# Patient Record
Sex: Female | Born: 1950 | Race: White | Hispanic: No | Marital: Married | Smoking: Former smoker
Health system: Southern US, Community
[De-identification: ages and names within clinical notes are randomized; demographics above are authoritative.]

## PROBLEM LIST (undated history)

## (undated) DIAGNOSIS — K219 Gastro-esophageal reflux disease without esophagitis: Secondary | ICD-10-CM

## (undated) DIAGNOSIS — R7301 Impaired fasting glucose: Secondary | ICD-10-CM

## (undated) DIAGNOSIS — F32A Depression, unspecified: Secondary | ICD-10-CM

## (undated) DIAGNOSIS — I639 Cerebral infarction, unspecified: Secondary | ICD-10-CM

## (undated) DIAGNOSIS — F419 Anxiety disorder, unspecified: Secondary | ICD-10-CM

## (undated) DIAGNOSIS — M810 Age-related osteoporosis without current pathological fracture: Secondary | ICD-10-CM

## (undated) DIAGNOSIS — E785 Hyperlipidemia, unspecified: Secondary | ICD-10-CM

## (undated) DIAGNOSIS — F329 Major depressive disorder, single episode, unspecified: Secondary | ICD-10-CM

## (undated) DIAGNOSIS — I10 Essential (primary) hypertension: Secondary | ICD-10-CM

## (undated) HISTORY — DX: Cerebral infarction, unspecified: I63.9

## (undated) HISTORY — DX: Age-related osteoporosis without current pathological fracture: M81.0

## (undated) HISTORY — PX: TUBAL LIGATION: SHX77

## (undated) HISTORY — PX: TONSILLECTOMY: SUR1361

## (undated) HISTORY — DX: Essential (primary) hypertension: I10

## (undated) HISTORY — PX: APPENDECTOMY: SHX54

## (undated) HISTORY — DX: Gastro-esophageal reflux disease without esophagitis: K21.9

## (undated) HISTORY — DX: Hyperlipidemia, unspecified: E78.5

## (undated) HISTORY — DX: Depression, unspecified: F32.A

## (undated) HISTORY — DX: Anxiety disorder, unspecified: F41.9

## (undated) HISTORY — DX: Major depressive disorder, single episode, unspecified: F32.9

## (undated) HISTORY — DX: Impaired fasting glucose: R73.01

---

## 2006-09-02 ENCOUNTER — Other Ambulatory Visit: Payer: Self-pay

## 2006-09-03 ENCOUNTER — Ambulatory Visit: Payer: Self-pay | Admitting: Internal Medicine

## 2006-09-03 ENCOUNTER — Inpatient Hospital Stay: Payer: Self-pay | Admitting: *Deleted

## 2006-11-03 ENCOUNTER — Emergency Department: Payer: Self-pay | Admitting: Emergency Medicine

## 2006-11-11 ENCOUNTER — Emergency Department: Payer: Self-pay | Admitting: Unknown Physician Specialty

## 2006-11-11 ENCOUNTER — Other Ambulatory Visit: Payer: Self-pay

## 2006-12-25 ENCOUNTER — Ambulatory Visit: Payer: Self-pay

## 2007-12-02 ENCOUNTER — Ambulatory Visit: Payer: Self-pay | Admitting: Gastroenterology

## 2010-12-27 ENCOUNTER — Ambulatory Visit: Payer: Self-pay

## 2011-04-19 ENCOUNTER — Ambulatory Visit: Payer: Self-pay

## 2011-04-21 ENCOUNTER — Ambulatory Visit: Payer: Self-pay

## 2011-04-21 ENCOUNTER — Inpatient Hospital Stay: Payer: Self-pay | Admitting: Internal Medicine

## 2012-02-19 ENCOUNTER — Ambulatory Visit: Payer: Self-pay

## 2012-05-08 ENCOUNTER — Emergency Department: Payer: Self-pay | Admitting: *Deleted

## 2013-07-14 ENCOUNTER — Ambulatory Visit: Payer: Self-pay | Admitting: Family Medicine

## 2013-09-28 ENCOUNTER — Ambulatory Visit: Payer: Self-pay

## 2014-01-17 IMAGING — CR LEFT THUMB 2+V
1 series · 3 of 3 positions shown · non-contrast
Comparison: none

REASON FOR EXAM: pain
COMMENTS:

PROCEDURE:     DONG HAUN - DONG HAUN THUMB LEFT HAND (1ST DIGIT)  - February 19, 2012 [DATE]
RESULT:     Comparison: None.

[Series 1: pa · 0.17mm/px · 3 of 3 slices shown]
[im 1/3]
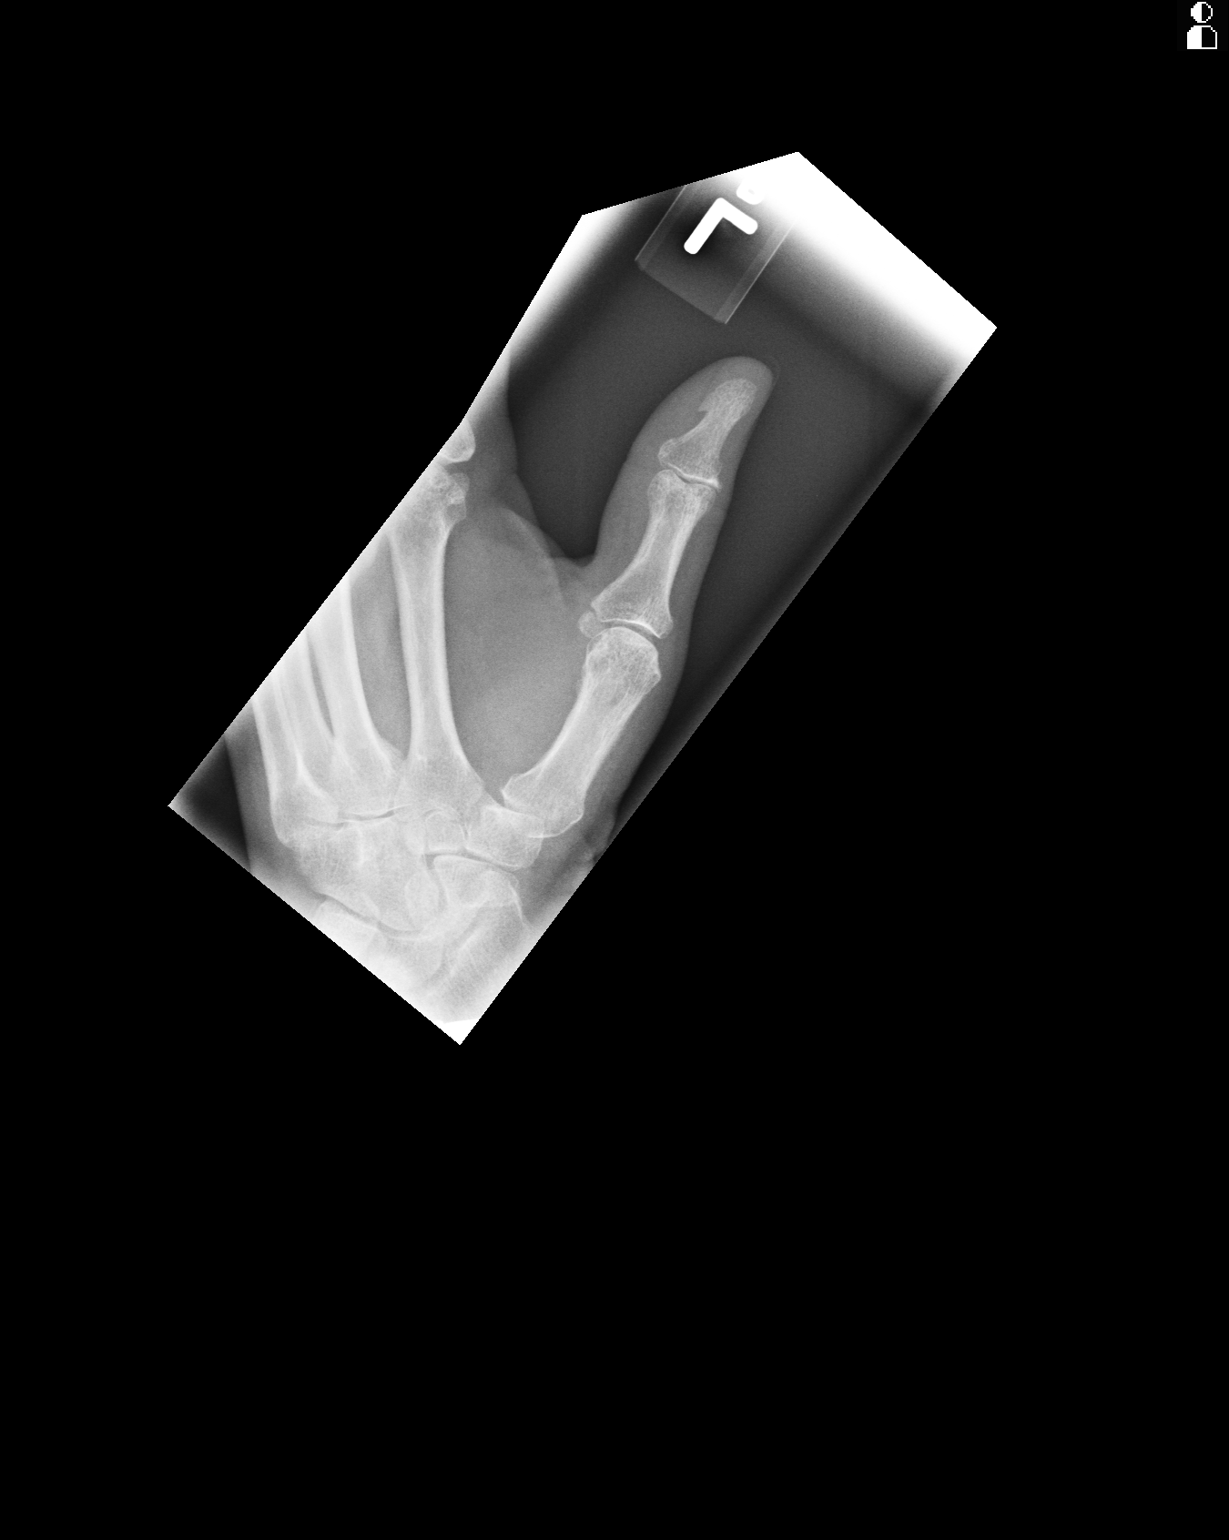
[im 2/3]
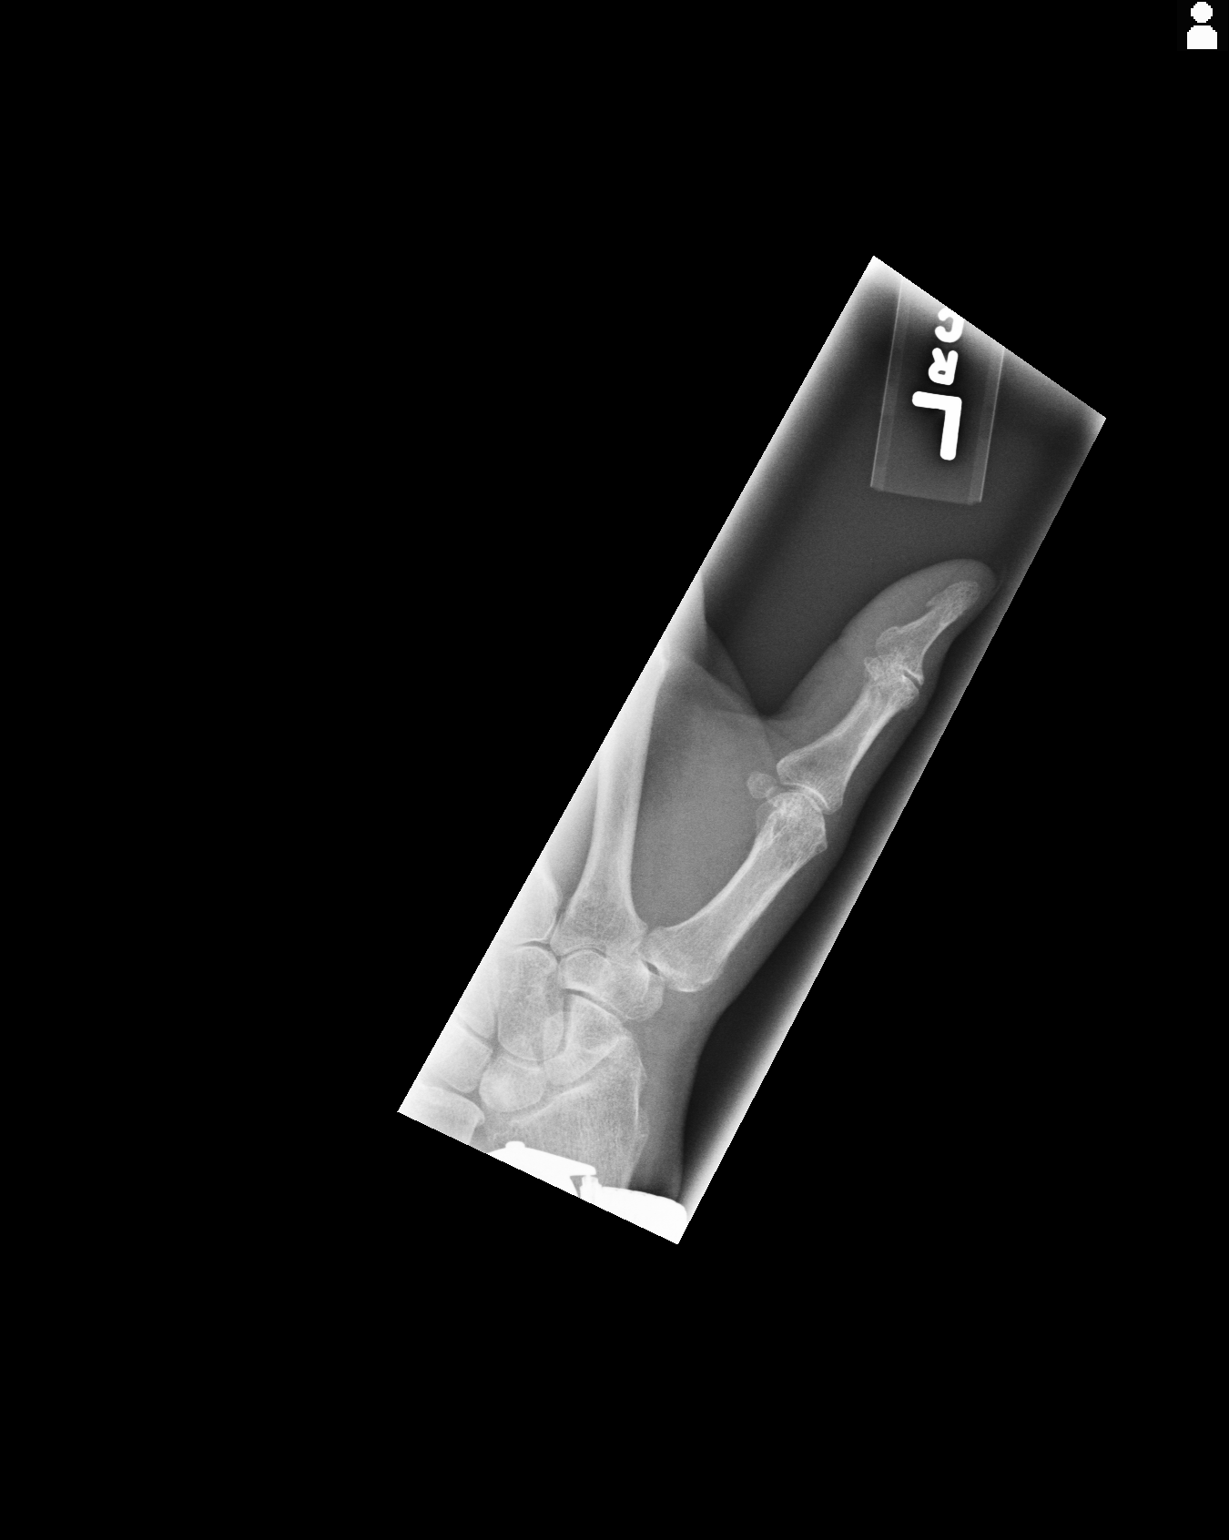
[im 3/3]
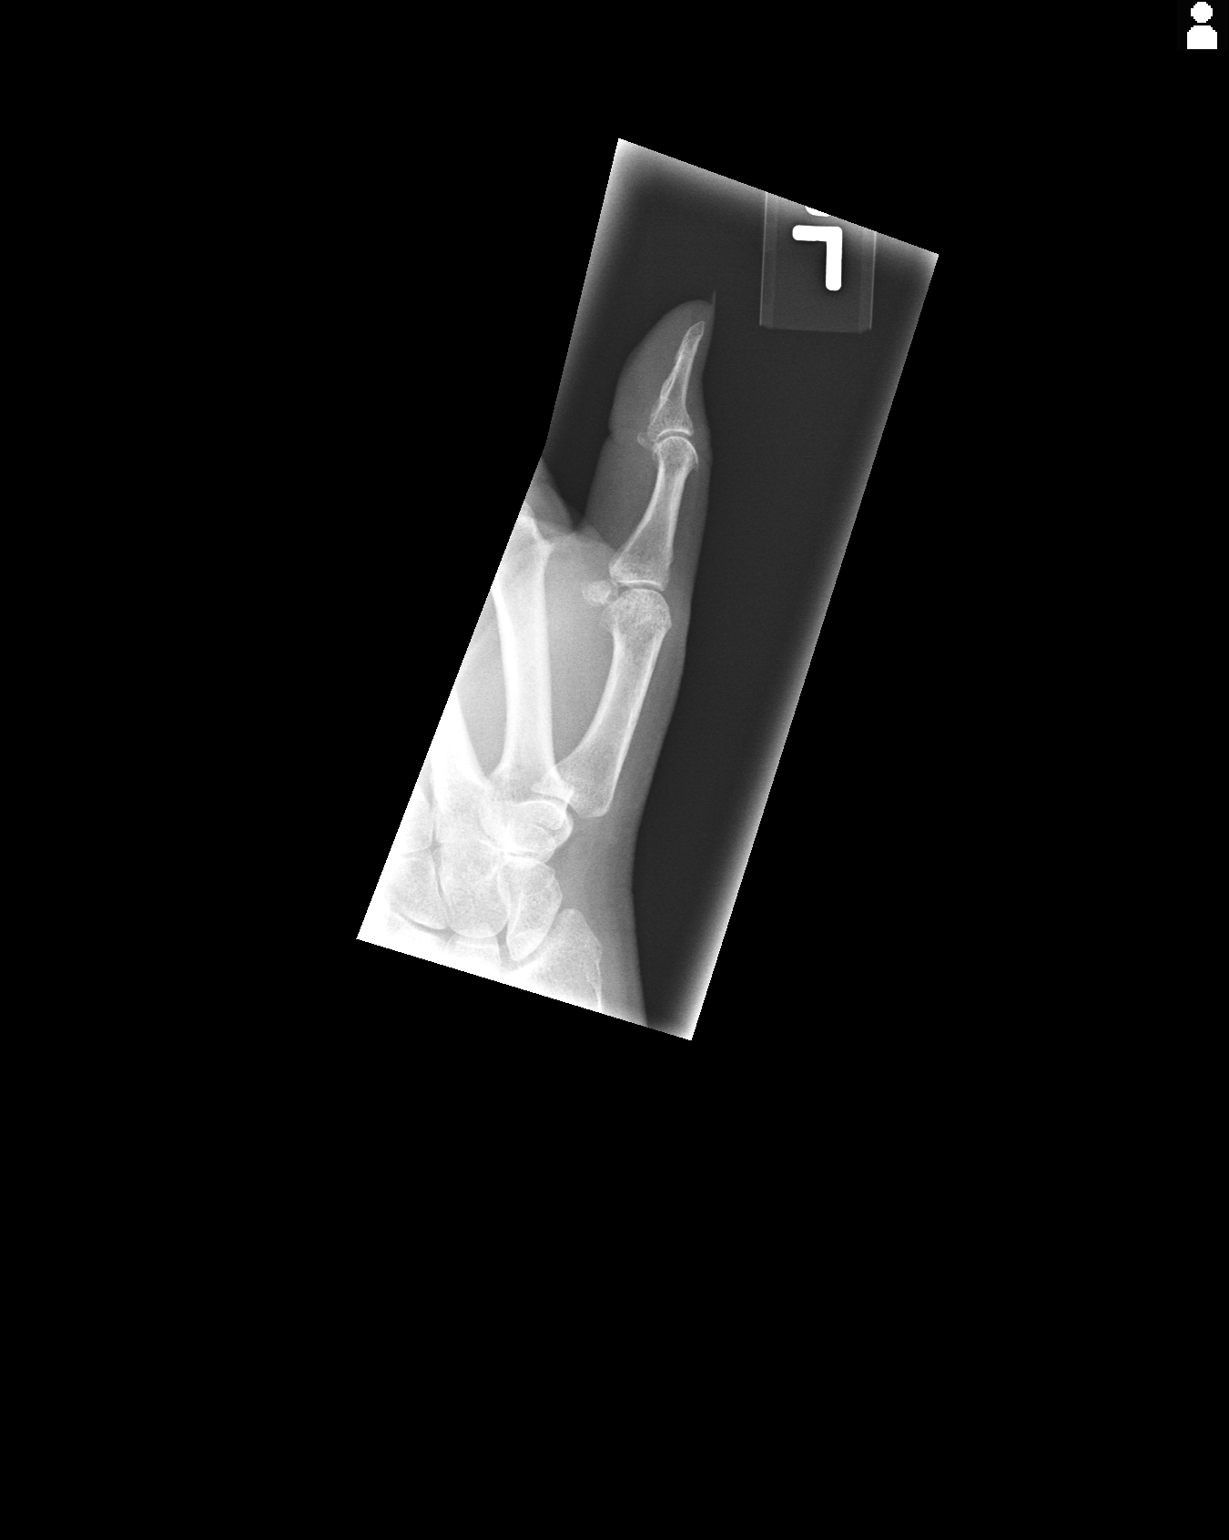

[3 of 3 positions shown; findings below may reference images not displayed]

FINDINGS: No acute fracture. Normal alignment. Joint spaces are maintained.
IMPRESSION: No acute fracture.

[REDACTED]

## 2015-02-10 DIAGNOSIS — B351 Tinea unguium: Secondary | ICD-10-CM | POA: Insufficient documentation

## 2015-02-10 DIAGNOSIS — I10 Essential (primary) hypertension: Secondary | ICD-10-CM

## 2015-02-10 DIAGNOSIS — B356 Tinea cruris: Secondary | ICD-10-CM | POA: Insufficient documentation

## 2015-02-10 DIAGNOSIS — I129 Hypertensive chronic kidney disease with stage 1 through stage 4 chronic kidney disease, or unspecified chronic kidney disease: Secondary | ICD-10-CM

## 2015-02-10 DIAGNOSIS — F419 Anxiety disorder, unspecified: Secondary | ICD-10-CM

## 2015-02-10 DIAGNOSIS — I639 Cerebral infarction, unspecified: Secondary | ICD-10-CM | POA: Insufficient documentation

## 2015-02-10 DIAGNOSIS — K219 Gastro-esophageal reflux disease without esophagitis: Secondary | ICD-10-CM | POA: Insufficient documentation

## 2015-02-10 DIAGNOSIS — E785 Hyperlipidemia, unspecified: Secondary | ICD-10-CM

## 2015-02-10 DIAGNOSIS — F329 Major depressive disorder, single episode, unspecified: Secondary | ICD-10-CM

## 2015-02-10 DIAGNOSIS — M810 Age-related osteoporosis without current pathological fracture: Secondary | ICD-10-CM | POA: Insufficient documentation

## 2015-02-10 DIAGNOSIS — F32A Depression, unspecified: Secondary | ICD-10-CM | POA: Insufficient documentation

## 2015-02-10 DIAGNOSIS — R7301 Impaired fasting glucose: Secondary | ICD-10-CM

## 2015-02-10 DIAGNOSIS — N181 Chronic kidney disease, stage 1: Secondary | ICD-10-CM

## 2015-02-10 DIAGNOSIS — Z8673 Personal history of transient ischemic attack (TIA), and cerebral infarction without residual deficits: Secondary | ICD-10-CM | POA: Insufficient documentation

## 2015-02-10 DIAGNOSIS — F322 Major depressive disorder, single episode, severe without psychotic features: Principal | ICD-10-CM | POA: Diagnosis present

## 2015-02-11 ENCOUNTER — Ambulatory Visit (INDEPENDENT_AMBULATORY_CARE_PROVIDER_SITE_OTHER): Payer: BLUE CROSS/BLUE SHIELD | Admitting: Unknown Physician Specialty

## 2015-02-11 ENCOUNTER — Encounter: Payer: Self-pay | Admitting: Unknown Physician Specialty

## 2015-02-11 VITALS — BP 118/71 | HR 69 | Temp 97.5°F | Ht 67.0 in | Wt 180.6 lb

## 2015-02-11 DIAGNOSIS — I10 Essential (primary) hypertension: Secondary | ICD-10-CM

## 2015-02-11 NOTE — Progress Notes (Signed)
No charge visit.  Pt not due for 3 months

## 2015-03-25 ENCOUNTER — Ambulatory Visit (INDEPENDENT_AMBULATORY_CARE_PROVIDER_SITE_OTHER): Payer: BLUE CROSS/BLUE SHIELD | Admitting: Unknown Physician Specialty

## 2015-03-25 ENCOUNTER — Encounter: Payer: Self-pay | Admitting: Unknown Physician Specialty

## 2015-03-25 VITALS — BP 112/68 | HR 70 | Temp 97.8°F | Ht 66.7 in | Wt 176.6 lb

## 2015-03-25 DIAGNOSIS — R300 Dysuria: Secondary | ICD-10-CM | POA: Diagnosis not present

## 2015-03-25 DIAGNOSIS — N3001 Acute cystitis with hematuria: Secondary | ICD-10-CM | POA: Diagnosis not present

## 2015-03-25 LAB — MICROSCOPIC EXAMINATION
Epithelial Cells (non renal): >10 /hpf — ABNORMAL HIGH (ref 0–10)
Renal Epithel, UA: NONE SEEN /hpf

## 2015-03-25 MED ORDER — CIPROFLOXACIN HCL 250 MG PO TABS: 250.0000 mg | ORAL_TABLET | Freq: Two times a day (BID) | ORAL | Status: DC

## 2015-03-25 NOTE — Progress Notes (Signed)
BP 112/68 mmHg  Pulse 70  Temp(Src) 97.8 F (36.6 C)  Ht 5' 6.7" (1.694 m)  Wt 176 lb 9.6 oz (80.105 kg)  BMI 27.91 kg/m2  SpO2 99%  LMP  (LMP Unknown)   Subjective:    Patient ID: Karina Freeman, female    DOB: 07-27-1950, 64 y.o.   MRN: [id number]  HPI: Karina Freeman is a 64 y.o. female  Chief Complaint  Patient presents with  . Urinary Tract Infection    pt states she has burning during urination, has lots of pressure and discomfort. States she has had this for about 3 or 4 times now. Has drank cranberry juice and water which helps, but symptoms keep coming back.  . Medication Refill    pt states pharmacy told them that we need to write a new rx for the generic brand of crestor   Urinary Tract Infection  This is a new problem. The current episode started more than 1 month ago. The problem occurs intermittently. The problem has been gradually worsening. The quality of the pain is described as burning and aching. There has been no fever. Associated symptoms include frequency and urgency. Pertinent negatives include no chills, discharge, flank pain or vomiting. She has tried increased fluids (cranberry juice) for the symptoms.    Relevant past medical, surgical, family and social history reviewed and updated as indicated. Interim medical history since our last visit reviewed. Allergies and medications reviewed and updated.  Review of Systems  Constitutional: Negative for chills.  Gastrointestinal: Negative for vomiting.  Genitourinary: Positive for urgency and frequency. Negative for flank pain.    Per HPI unless specifically indicated above     Objective:    BP 112/68 mmHg  Pulse 70  Temp(Src) 97.8 F (36.6 C)  Ht 5' 6.7" (1.694 m)  Wt 176 lb 9.6 oz (80.105 kg)  BMI 27.91 kg/m2  SpO2 99%  LMP  (LMP Unknown)  Wt Readings from Last 3 Encounters:  03/25/15 176 lb 9.6 oz (80.105 kg)  02/11/15 180 lb 9.6 oz (81.92 kg)  11/03/14 186 lb (84.369 kg)    Physical  Exam  Constitutional: She is oriented to person, place, and time. She appears well-developed and well-nourished. No distress.  HENT:  Head: Normocephalic and atraumatic.  Eyes: Conjunctivae and lids are normal. Right eye exhibits no discharge. Left eye exhibits no discharge. No scleral icterus.  Cardiovascular: Normal rate and regular rhythm.   Pulmonary/Chest: Effort normal. No respiratory distress.  Abdominal: Soft. Normal appearance and bowel sounds are normal. She exhibits no distension. There is no splenomegaly or hepatomegaly. There is no tenderness. There is no CVA tenderness.  Musculoskeletal: Normal range of motion.  Neurological: She is alert and oriented to person, place, and time.  Skin: Skin is intact. No rash noted. No pallor.  Psychiatric: She has a normal mood and affect. Her behavior is normal. Judgment and thought content normal.  Nursing note and vitals reviewed.  Urine markedly positive.       Assessment & Plan:   Problem List Items Addressed This Visit    None    Visit Diagnoses    Burning with urination    -  Primary    Relevant Orders    UA/M w/rflx Culture, Routine    Acute cystitis with hematuria        Rx for Cipro 250 mg BID for 7 days        Follow up plan: No Follow-up on file.

## 2015-03-31 LAB — URINE CULTURE, REFLEX

## 2015-04-01 ENCOUNTER — Telehealth: Payer: Self-pay | Admitting: Family Medicine

## 2015-04-01 LAB — UA/M W/RFLX CULTURE, ROUTINE

## 2015-04-01 MED ORDER — PANTOPRAZOLE SODIUM 40 MG PO TBEC: 40.0000 mg | DELAYED_RELEASE_TABLET | Freq: Every day | ORAL | Status: DC

## 2015-04-01 NOTE — Telephone Encounter (Signed)
Urine culture came back; sensitive to cipro; she started it a few days after she got it, so she finishes on Sunday; infection doing better She has some swelling along the side and back of the ankle; not the achilles tendon, not sore; no redness in the calf, nothing like a blood clot; discussed risk of tendinopathy, reasons to stop it and call on-call provider; try ice pack, leg elevation, calf exercises; call doctor over weekend if needed She takes Plavix, so I switched her PPI from omeprazole to pantoprazole, explained why

## 2015-04-19 ENCOUNTER — Other Ambulatory Visit: Payer: Self-pay | Admitting: Unknown Physician Specialty

## 2015-04-26 ENCOUNTER — Encounter: Payer: Self-pay | Admitting: Unknown Physician Specialty

## 2015-04-26 ENCOUNTER — Ambulatory Visit (INDEPENDENT_AMBULATORY_CARE_PROVIDER_SITE_OTHER): Payer: BLUE CROSS/BLUE SHIELD | Admitting: Unknown Physician Specialty

## 2015-04-26 VITALS — BP 133/77 | HR 70 | Temp 97.7°F | Ht 66.7 in | Wt 177.6 lb

## 2015-04-26 DIAGNOSIS — K219 Gastro-esophageal reflux disease without esophagitis: Secondary | ICD-10-CM

## 2015-04-26 DIAGNOSIS — N181 Chronic kidney disease, stage 1: Secondary | ICD-10-CM

## 2015-04-26 DIAGNOSIS — I129 Hypertensive chronic kidney disease with stage 1 through stage 4 chronic kidney disease, or unspecified chronic kidney disease: Secondary | ICD-10-CM

## 2015-04-26 DIAGNOSIS — E785 Hyperlipidemia, unspecified: Secondary | ICD-10-CM

## 2015-04-26 DIAGNOSIS — R7301 Impaired fasting glucose: Secondary | ICD-10-CM

## 2015-04-26 DIAGNOSIS — Z23 Encounter for immunization: Secondary | ICD-10-CM | POA: Diagnosis not present

## 2015-04-26 LAB — LIPID PANEL PICCOLO, WAIVED
CHOLESTEROL PICCOLO, WAIVED: 157 mg/dL (ref <200)
Chol/HDL Ratio Piccolo,Waive: 2.7 mg/dL
HDL CHOL PICCOLO, WAIVED: 59 mg/dL (ref >59)
LDL Chol Calc Piccolo Waived: 79 mg/dL (ref <100)
Triglycerides Piccolo,Waived: 95 mg/dL (ref <150)
VLDL CHOL CALC PICCOLO,WAIVE: 19 mg/dL (ref <30)

## 2015-04-26 LAB — BAYER DCA HB A1C WAIVED: HB A1C (BAYER DCA - WAIVED): 5.6 % (ref <7.0)

## 2015-04-26 MED ORDER — PANTOPRAZOLE SODIUM 40 MG PO TBEC: 40.0000 mg | DELAYED_RELEASE_TABLET | Freq: Every day | ORAL | Status: DC

## 2015-04-26 MED ORDER — ROSUVASTATIN CALCIUM 10 MG PO TABS: 10.0000 mg | ORAL_TABLET | Freq: Every day | ORAL | Status: DC

## 2015-04-26 MED ORDER — LISINOPRIL-HYDROCHLOROTHIAZIDE 10-12.5 MG PO TABS: 1.0000 | ORAL_TABLET | Freq: Every day | ORAL | Status: DC

## 2015-04-26 NOTE — Assessment & Plan Note (Signed)
Stable, continue present medications.   

## 2015-04-26 NOTE — Assessment & Plan Note (Signed)
Reviewed lipid panel.  LDL was 76.  Continue present medications.    

## 2015-04-26 NOTE — Assessment & Plan Note (Signed)
Hgb A1C is 5.6 

## 2015-04-26 NOTE — Progress Notes (Signed)
BP 133/77 mmHg  Pulse 70  Temp(Src) 97.7 F (36.5 C)  Ht 5' 6.7" (1.694 m)  Wt 177 lb 9.6 oz (80.559 kg)  BMI 28.07 kg/m2  SpO2 97%  LMP  (LMP Unknown)   Subjective:    Patient ID: Karina Freeman, female    DOB: 11-25-1950, 64 y.o.   MRN: [id number]  HPI: Karina Freeman is a 64 y.o. female  Chief Complaint  Patient presents with  . Hyperlipidemia  . Hypertension   Hyperlipidemia This is a chronic problem. The problem is controlled. Pertinent negatives include no chest pain, focal sensory loss, focal weakness, myalgias or shortness of breath. Current antihyperlipidemic treatment includes statins. There are no compliance problems.   Hypertension This is a chronic problem. The current episode started today. Pertinent negatives include no chest pain, malaise/fatigue, neck pain, palpitations or shortness of breath. The current treatment provides significant improvement. There are no compliance problems.    GERD No symptoms on current medications. Changed to Pantoprazole.  Relevant past medical, surgical, family and social history reviewed and updated as indicated. Interim medical history since our last visit reviewed. Allergies and medications reviewed and updated.  Review of Systems  Constitutional: Negative for malaise/fatigue.  Respiratory: Negative for shortness of breath.   Cardiovascular: Negative for chest pain and palpitations.  Musculoskeletal: Negative for myalgias and neck pain.  Neurological: Negative for focal weakness.    Per HPI unless specifically indicated above     Objective:    BP 133/77 mmHg  Pulse 70  Temp(Src) 97.7 F (36.5 C)  Ht 5' 6.7" (1.694 m)  Wt 177 lb 9.6 oz (80.559 kg)  BMI 28.07 kg/m2  SpO2 97%  LMP  (LMP Unknown)  Wt Readings from Last 3 Encounters:  04/26/15 177 lb 9.6 oz (80.559 kg)  03/25/15 176 lb 9.6 oz (80.105 kg)  02/11/15 180 lb 9.6 oz (81.92 kg)    Physical Exam  Constitutional: She is oriented to person, place,  and time. She appears well-developed and well-nourished. No distress.  HENT:  Head: Normocephalic and atraumatic.  Eyes: Conjunctivae and lids are normal. Right eye exhibits no discharge. Left eye exhibits no discharge. No scleral icterus.  Cardiovascular: Normal rate, regular rhythm and normal heart sounds.   Pulmonary/Chest: Effort normal and breath sounds normal. No respiratory distress.  Abdominal: Normal appearance and bowel sounds are normal. There is no splenomegaly or hepatomegaly.  Musculoskeletal: Normal range of motion.  Neurological: She is alert and oriented to person, place, and time.  Skin: Skin is intact. No rash noted. No pallor.  Psychiatric: She has a normal mood and affect. Her behavior is normal. Judgment and thought content normal.      Assessment & Plan:   Problem List Items Addressed This Visit      Unprioritized   IFG (impaired fasting glucose)    Hgb A1C is 5.6      Relevant Orders   Bayer DCA Hb A1c Waived   Hyperlipidemia    Reviewed lipid panel.  LDL was 76.  Continue present medications.         Relevant Medications   lisinopril-hydrochlorothiazide (PRINZIDE,ZESTORETIC) 10-12.5 MG tablet   rosuvastatin (CRESTOR) 10 MG tablet   Other Relevant Orders   Lipid Panel Piccolo, Waived   GERD (gastroesophageal reflux disease)    Stable, continue present medications.       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       Relevant Medications   lisinopril-hydrochlorothiazide (PRINZIDE,ZESTORETIC) 10-12.5 MG tablet   rosuvastatin (CRESTOR) 10 MG tablet   Other Relevant Orders   Comprehensive metabolic panel    Other Visit Diagnoses    Immunization due    -  Primary    Relevant Orders    Flu Vaccine QUAD 36+ mos PF IM (Fluarix & Fluzone Quad PF) (Completed)        Follow up plan: Return for april for PE.

## 2015-04-27 ENCOUNTER — Encounter: Payer: Self-pay | Admitting: Unknown Physician Specialty

## 2015-04-27 ENCOUNTER — Telehealth: Payer: Self-pay | Admitting: Unknown Physician Specialty

## 2015-04-27 LAB — COMPREHENSIVE METABOLIC PANEL
A/G RATIO: 1.8 (ref 1.1–2.5)
ALBUMIN: 4.4 g/dL (ref 3.6–4.8)
ALK PHOS: 68 IU/L (ref 39–117)
ALT: 12 IU/L (ref 0–32)
AST: 12 IU/L (ref 0–40)
BILIRUBIN TOTAL: 0.3 mg/dL (ref 0.0–1.2)
BUN / CREAT RATIO: 11 (ref 11–26)
BUN: 12 mg/dL (ref 8–27)
CHLORIDE: 96 mmol/L — AB (ref 97–108)
CO2: 25 mmol/L (ref 18–29)
Calcium: 9 mg/dL (ref 8.7–10.3)
Creatinine, Ser: 1.06 mg/dL — ABNORMAL HIGH (ref 0.57–1.00)
GFR calc non Af Amer: 56 mL/min/{1.73_m2} — ABNORMAL LOW (ref >59)
GFR, EST AFRICAN AMERICAN: 64 mL/min/{1.73_m2} (ref >59)
GLOBULIN, TOTAL: 2.5 g/dL (ref 1.5–4.5)
Glucose: 101 mg/dL — ABNORMAL HIGH (ref 65–99)
Potassium: 4.2 mmol/L (ref 3.5–5.2)
SODIUM: 137 mmol/L (ref 134–144)
TOTAL PROTEIN: 6.9 g/dL (ref 6.0–8.5)

## 2015-04-27 NOTE — Telephone Encounter (Signed)
Discussed with patient about GFR of 56.  She avoids NSAIDS and drinks lots of water but not on that day.  I would like to recheck this in 3 months as she is now CKD stage 3.  If decreasing more, refer to nephrology

## 2015-06-15 ENCOUNTER — Telehealth: Payer: Self-pay | Admitting: Unknown Physician Specialty

## 2015-06-15 NOTE — Telephone Encounter (Signed)
Pt called would like a call back from CW this afternoon. Thanks.

## 2015-06-15 NOTE — Telephone Encounter (Addendum)
Called patient twice.    No answer.  Please encourage mychart or see what she needs

## 2015-06-15 NOTE — Telephone Encounter (Signed)
Routing to provider  

## 2015-07-20 ENCOUNTER — Other Ambulatory Visit: Payer: Self-pay | Admitting: Unknown Physician Specialty

## 2015-08-27 IMAGING — MG MM DIGITAL SCREENING BILAT W/ CAD
1 series · 4 of 4 positions shown · non-contrast
Comparison: Previous exam(s).

CLINICAL DATA: Screening.

EXAM:
DIGITAL SCREENING BILATERAL MAMMOGRAM WITH CAD

[R CC · right · 4 of 4 slices shown]
[im 1/4]
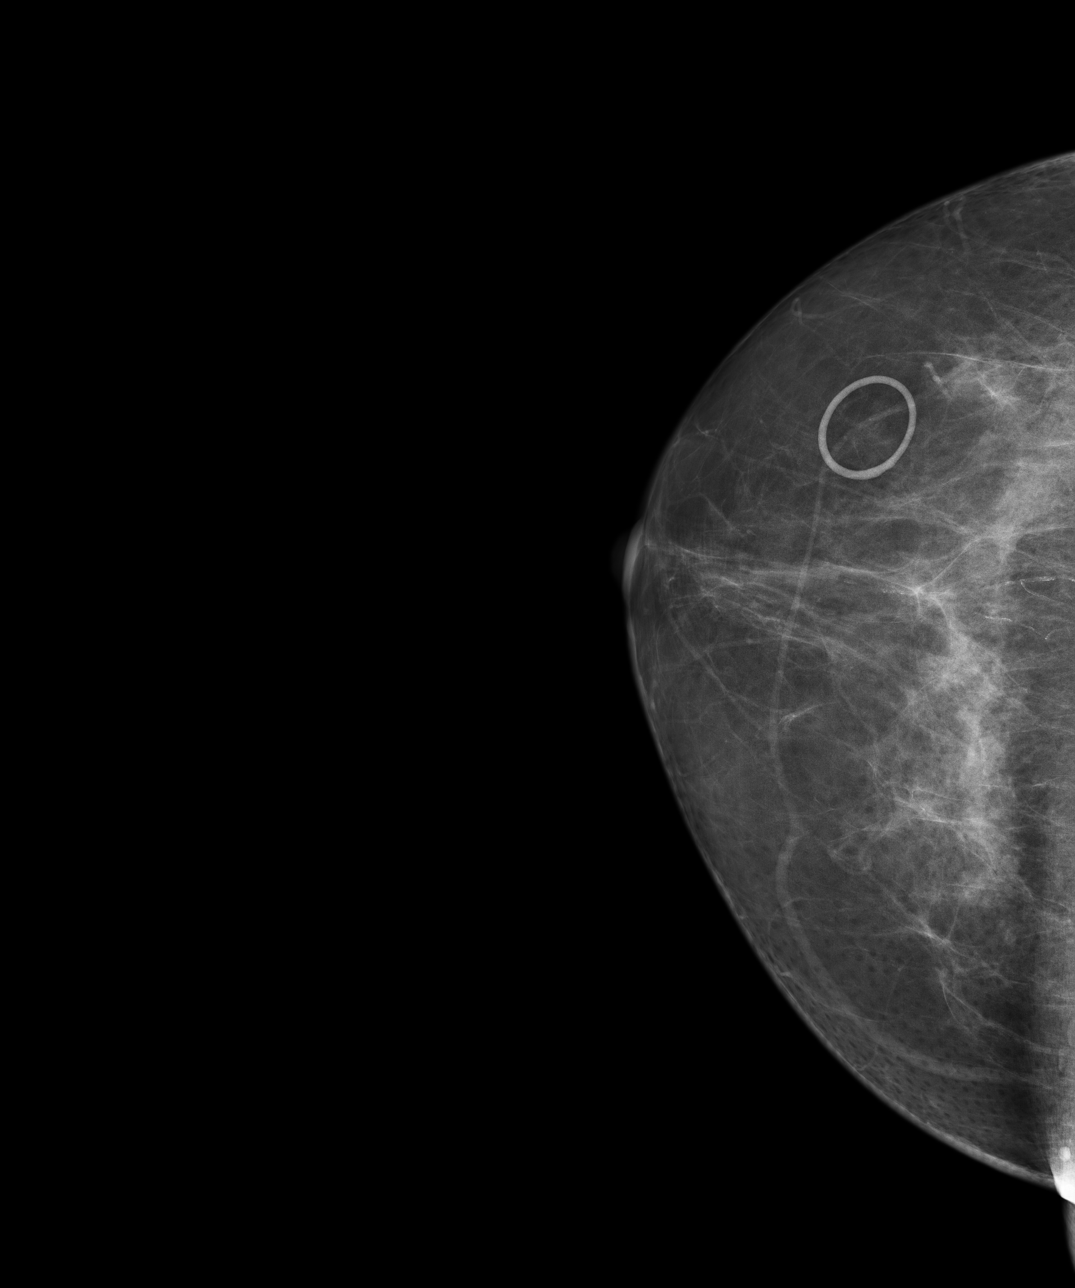
[im 2/4]
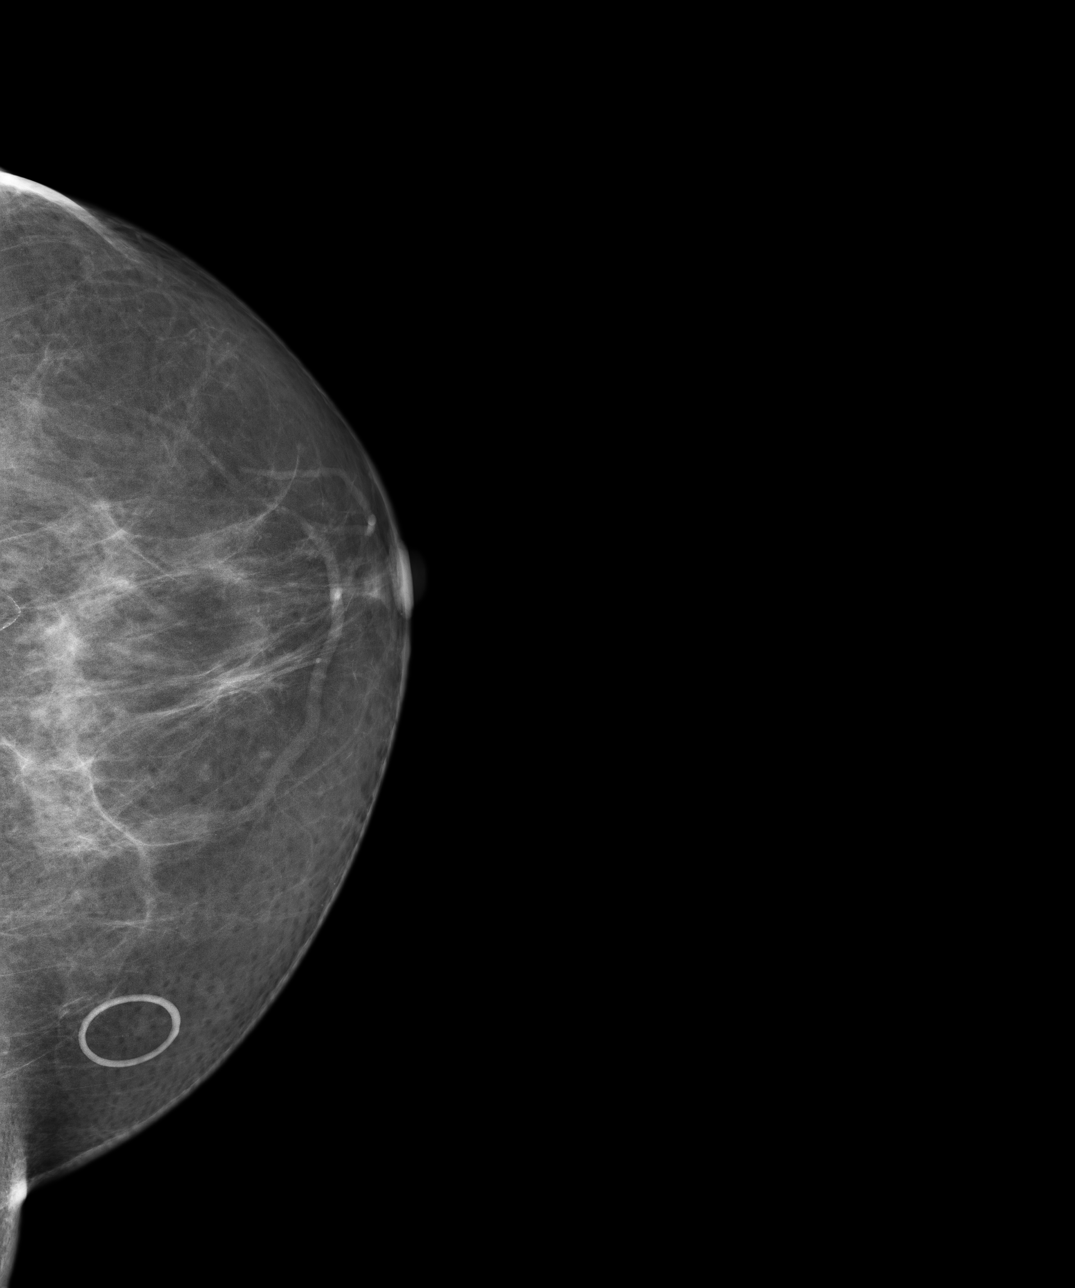
[im 3/4]
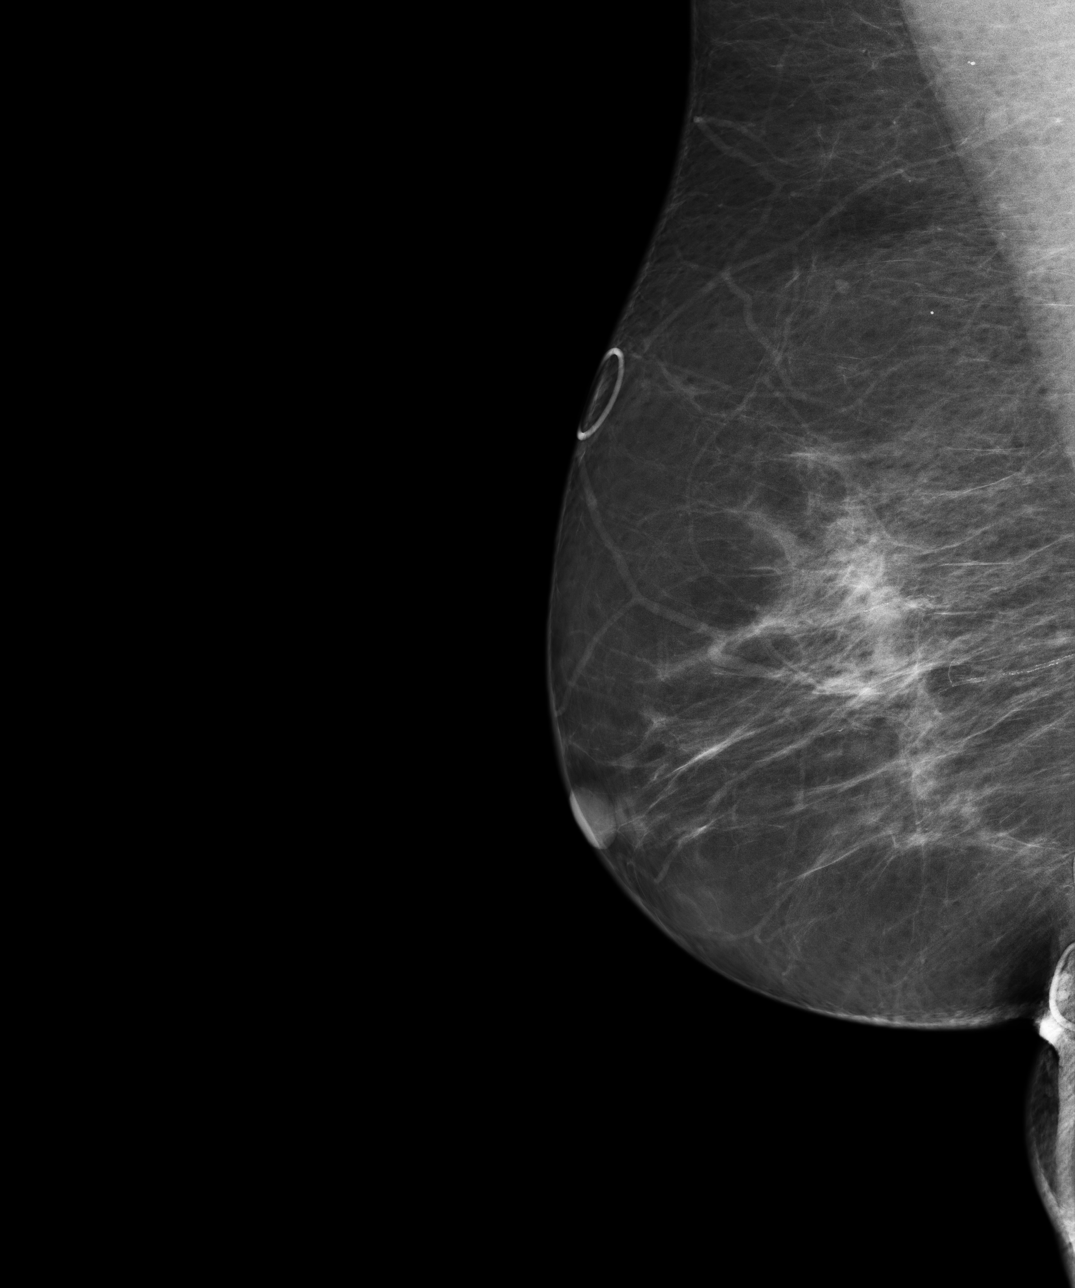
[im 4/4]
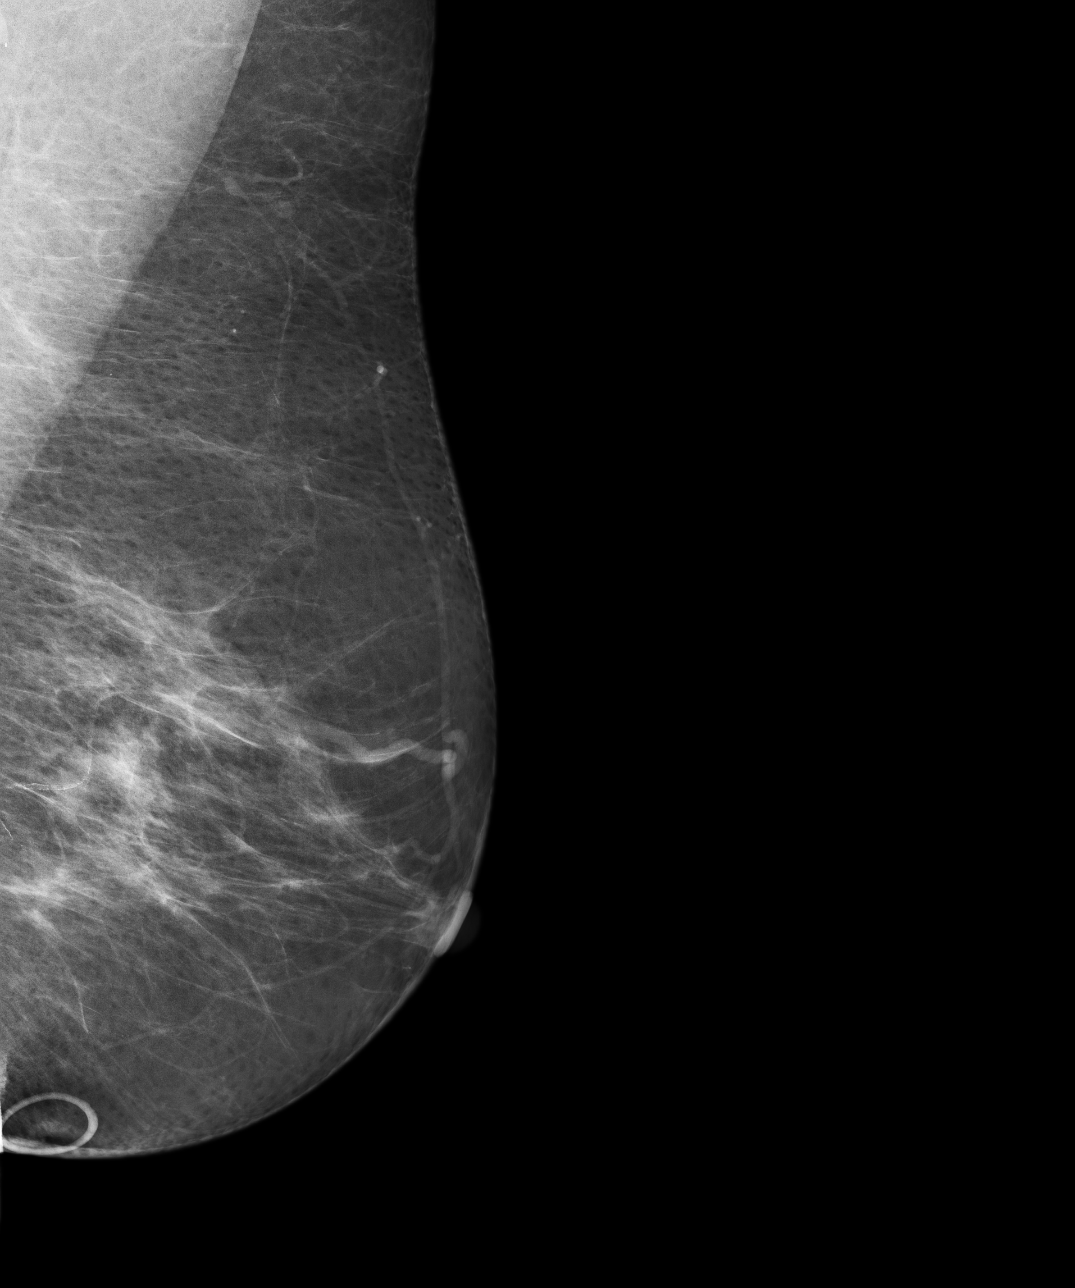

[4 of 4 positions shown; findings below may reference images not displayed]

ACR Breast Density Category b: There are scattered areas of
fibroglandular density.
FINDINGS: There are no findings suspicious for malignancy. Images were
processed with CAD.
IMPRESSION: No mammographic evidence of malignancy. A result letter of this
screening mammogram will be mailed directly to the patient.

RECOMMENDATION:
Screening mammogram in one year. (Code:AS-G-LCT)

BI-RADS CATEGORY  1: Negative.

## 2015-11-11 ENCOUNTER — Telehealth: Payer: Self-pay

## 2015-11-11 ENCOUNTER — Other Ambulatory Visit: Payer: Self-pay

## 2015-11-11 ENCOUNTER — Encounter: Payer: Self-pay | Admitting: Unknown Physician Specialty

## 2015-11-11 ENCOUNTER — Ambulatory Visit (INDEPENDENT_AMBULATORY_CARE_PROVIDER_SITE_OTHER): Payer: Medicare Other | Admitting: Unknown Physician Specialty

## 2015-11-11 VITALS — BP 125/78 | HR 88 | Temp 98.1°F | Ht 66.0 in | Wt 174.6 lb

## 2015-11-11 DIAGNOSIS — Z0000 Encounter for general adult medical examination without abnormal findings: Secondary | ICD-10-CM | POA: Diagnosis not present

## 2015-11-11 DIAGNOSIS — Z23 Encounter for immunization: Secondary | ICD-10-CM

## 2015-11-11 DIAGNOSIS — E785 Hyperlipidemia, unspecified: Secondary | ICD-10-CM

## 2015-11-11 DIAGNOSIS — E2839 Other primary ovarian failure: Secondary | ICD-10-CM

## 2015-11-11 DIAGNOSIS — R87619 Unspecified abnormal cytological findings in specimens from cervix uteri: Secondary | ICD-10-CM | POA: Insufficient documentation

## 2015-11-11 DIAGNOSIS — N181 Chronic kidney disease, stage 1: Secondary | ICD-10-CM | POA: Diagnosis not present

## 2015-11-11 DIAGNOSIS — Z124 Encounter for screening for malignant neoplasm of cervix: Secondary | ICD-10-CM

## 2015-11-11 DIAGNOSIS — S43421A Sprain of right rotator cuff capsule, initial encounter: Secondary | ICD-10-CM

## 2015-11-11 DIAGNOSIS — R87612 Low grade squamous intraepithelial lesion on cytologic smear of cervix (LGSIL): Secondary | ICD-10-CM | POA: Diagnosis not present

## 2015-11-11 DIAGNOSIS — Z1231 Encounter for screening mammogram for malignant neoplasm of breast: Secondary | ICD-10-CM

## 2015-11-11 DIAGNOSIS — Z1239 Encounter for other screening for malignant neoplasm of breast: Secondary | ICD-10-CM

## 2015-11-11 DIAGNOSIS — I129 Hypertensive chronic kidney disease with stage 1 through stage 4 chronic kidney disease, or unspecified chronic kidney disease: Secondary | ICD-10-CM | POA: Diagnosis not present

## 2015-11-11 DIAGNOSIS — K219 Gastro-esophageal reflux disease without esophagitis: Secondary | ICD-10-CM

## 2015-11-11 HISTORY — DX: Unspecified abnormal cytological findings in specimens from cervix uteri: R87.619

## 2015-11-11 MED ORDER — ROSUVASTATIN CALCIUM 10 MG PO TABS: 10.0000 mg | ORAL_TABLET | Freq: Every day | ORAL | Status: DC

## 2015-11-11 MED ORDER — LISINOPRIL-HYDROCHLOROTHIAZIDE 10-12.5 MG PO TABS: 1.0000 | ORAL_TABLET | Freq: Every day | ORAL | Status: DC

## 2015-11-11 MED ORDER — CLOPIDOGREL BISULFATE 75 MG PO TABS: 75.0000 mg | ORAL_TABLET | Freq: Every day | ORAL | Status: DC

## 2015-11-11 MED ORDER — PANTOPRAZOLE SODIUM 40 MG PO TBEC: 40.0000 mg | DELAYED_RELEASE_TABLET | Freq: Every day | ORAL | Status: DC

## 2015-11-11 MED ORDER — OMEPRAZOLE 20 MG PO CPDR: 20.0000 mg | DELAYED_RELEASE_CAPSULE | Freq: Every day | ORAL | Status: DC

## 2015-11-11 NOTE — Assessment & Plan Note (Signed)
Stable, continue present medications.   

## 2015-11-11 NOTE — Progress Notes (Signed)
BP 125/78 mmHg  Pulse 88  Temp(Src) 98.1 F (36.7 C)  Ht 5\' 6"  (1.676 m)  Wt 174 lb 9.6 oz (79.198 kg)  BMI 28.19 kg/m2  SpO2 98%  LMP  (LMP Unknown)   Subjective:    Patient ID: Karina Freeman, female    DOB: 18-Apr-1951, 65 y.o.   MRN: [id number]  HPI: Karina Freeman is a [age] y.o. female  Chief Complaint  Patient presents with  . 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 and memory is worse than before the stroke) Does the patient have difficulty walking or climbing stairs?: No Does the patient have difficulty dressing or bathing?: No Does the patient have difficulty doing errands alone such as visiting a doctor's office or shopping?: No  Depression screen PHQ 2/9 11/11/2015  Decreased Interest 0  Down, Depressed, Hopeless 0  PHQ - 2 Score 0     Hypertension Using medications without difficulty Average home BPsWNL  No problems or lightheadedness No chest pain with exertion or shortness of breath No Edema   Hyperlipidemia Using medications without problems: No Muscle aches  Diet compliance: good Exercise: good  Reflux Needs another medication due to insurance  Social History   Social History  . Marital Status: Divorced    Spouse Name: N/A  . Number of Children: N/A  . Years of Education: N/A   Occupational History  . Not on file.   Social History Main Topics  . Smoking status: Former Games developermoker  . Smokeless tobacco: Never Used  . Alcohol Use: No  . Drug Use: No  . Sexual Activity: Yes   Other Topics Concern  . Not on file   Social History Narrative   Past Surgical History  Procedure Laterality Date  .  Appendectomy    . Tubal ligation    . Tonsillectomy     Family History  Problem Relation Age of Onset  . Heart disease Mother   . Cancer Mother     breast  . Heart disease Father   . Heart disease Brother   . Bipolar disorder Brother   . Heart disease Sister        Relevant past medical, surgical, family and social history reviewed and updated as indicated. Interim medical history since our last visit reviewed. Allergies and medications reviewed and updated.  Review of Systems  Constitutional: Negative.   HENT: Negative.   Eyes: Negative.   Respiratory: Negative.   Cardiovascular: Negative.   Gastrointestinal: Negative.   Endocrine: Negative.   Genitourinary: Negative.   Musculoskeletal: Negative.        Right shoulder pain halfway down deltoid x 1 month.  Getting better.  Happened when reached for something in the back seat of the car  Skin: Negative.   Allergic/Immunologic: Negative.   Neurological: Negative.   Hematological: Negative.   Psychiatric/Behavioral: Negative.     Per HPI unless specifically indicated above     Objective:    BP 125/78 mmHg  Pulse 88  Temp(Src) 98.1 F (36.7 C)  Ht 5\' 6"  (1.676 m)  Wt 174 lb 9.6 oz (79.198 kg)  BMI 28.19 kg/m2  SpO2 98%  LMP  (LMP Unknown)  Wt  Readings from Last 3 Encounters:  11/11/15 174 lb 9.6 oz (79.198 kg)  04/26/15 177 lb 9.6 oz (80.559 kg)  03/25/15 176 lb 9.6 oz (80.105 kg)    Physical Exam  Constitutional: She is oriented to person, place, and time. She appears well-developed and well-nourished.  HENT:  Head: Normocephalic and atraumatic.  Eyes: Pupils are equal, round, and reactive to light. Right eye exhibits no discharge. Left eye exhibits no discharge. No scleral icterus.  Neck: Normal range of motion. Neck supple. Carotid bruit is not present. No thyromegaly present.  Cardiovascular: Normal rate, regular rhythm and normal heart sounds.  Exam reveals no gallop and no friction rub.   No murmur  heard. Pulmonary/Chest: Effort normal and breath sounds normal. No respiratory distress. She has no wheezes. She has no rales.  Abdominal: Soft. Bowel sounds are normal. There is no tenderness. There is no rebound.  Genitourinary: Vagina normal and uterus normal. No breast swelling, tenderness or discharge. Cervix exhibits no motion tenderness, no discharge and no friability. Right adnexum displays no mass, no tenderness and no fullness. Left adnexum displays no mass, no tenderness and no fullness.  Musculoskeletal: Normal range of motion.  Lymphadenopathy:    She has no cervical adenopathy.  Neurological: She is alert and oriented to person, place, and time.  Skin: Skin is warm, dry and intact. No rash noted.  Psychiatric: She has a normal mood and affect. Her speech is normal and behavior is normal. Judgment and thought content normal. Cognition and memory are normal.    Results for orders placed or performed in visit on 04/26/15  Bayer DCA Hb A1c Waived  Result Value Ref Range   Bayer DCA Hb A1c Waived 5.6 <7.0 %  Lipid Panel Piccolo, Waived  Result Value Ref Range   Cholesterol Piccolo, Waived 157 <200 mg/dL   HDL Chol Piccolo, Waived 59 >59 mg/dL   Triglycerides Piccolo,Waived 95 <150 mg/dL   Chol/HDL Ratio Piccolo,Waive 2.7 mg/dL   LDL Chol Calc Piccolo Waived 79 <100 mg/dL   VLDL Chol Calc Piccolo,Waive 19 <30 mg/dL  Comprehensive metabolic panel  Result Value Ref Range   Glucose 101 (H) 65 - 99 mg/dL   BUN 12 8 - 27 mg/dL   Creatinine, Ser 9.60 (H) 0.57 - 1.00 mg/dL   GFR calc non Af Amer 56 (L) >59 mL/min/1.73   GFR calc Af Amer 64 >59 mL/min/1.73   BUN/Creatinine Ratio 11 11 - 26   Sodium 137 134 - 144 mmol/L   Potassium 4.2 3.5 - 5.2 mmol/L   Chloride 96 (L) 97 - 108 mmol/L   CO2 25 18 - 29 mmol/L   Calcium 9.0 8.7 - 10.3 mg/dL   Total Protein 6.9 6.0 - 8.5 g/dL   Albumin 4.4 3.6 - 4.8 g/dL   Globulin, Total 2.5 1.5 - 4.5 g/dL   Albumin/Globulin Ratio 1.8 1.1 - 2.5    Bilirubin Total 0.3 0.0 - 1.2 mg/dL   Alkaline Phosphatase 68 39 - 117 IU/L   AST 12 0 - 40 IU/L   ALT 12 0 - 32 IU/L      Assessment & Plan:   Problem List Items Addressed This Visit      Unprioritized   Abnormal Pap smear of cervix   Relevant Orders   IGP, Aptima HPV, rfx 16/18,45   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        Relevant Medications   lisinopril-hydrochlorothiazide (  PRINZIDE,ZESTORETIC) 10-12.5 MG tablet   rosuvastatin (CRESTOR) 10 MG tablet   Other Relevant Orders   Comprehensive metabolic panel   GERD (gastroesophageal reflux disease)    Reviewed insurance formulary.  Omeprazole interferes wit Plavix.  Continue Protonix      Relevant Medications   pantoprazole (PROTONIX) 40 MG tablet   Hyperlipidemia    Stable, continue present medications.        Relevant Medications   lisinopril-hydrochlorothiazide (PRINZIDE,ZESTORETIC) 10-12.5 MG tablet   rosuvastatin (CRESTOR) 10 MG tablet   Other Relevant Orders   Lipid Panel w/o Chol/HDL Ratio    Other Visit Diagnoses    Need for pneumococcal vaccination    -  Primary    Relevant Orders    Pneumococcal conjugate vaccine 13-valent IM (Completed)    Routine general medical examination at a health care facility        Relevant Orders    EKG 12-Lead (Completed)    Hepatitis C antibody    Sprain of right rotator cuff capsule, initial encounter        Exercises given.          Follow up plan: Return in about 6 months (around 05/12/2016).

## 2015-11-11 NOTE — Telephone Encounter (Signed)
-----   Message from Gabriel Cirriheryl Wicker, NP sent at 11/11/2015 11:49 AM EDT ----- Regarding: Please call about schedule mammogram and dexa And I schedule colonoscopy

## 2015-11-11 NOTE — Patient Instructions (Addendum)
Pneumococcal Conjugate Vaccine (PCV13)  1. Why get vaccinated? Vaccination can protect both children and adults from pneumococcal disease. Pneumococcal disease is caused by bacteria that can spread from person to person through close contact. It can cause ear infections, and it can also lead to more serious infections of the:  Lungs (pneumonia),  Blood (bacteremia), and  Covering of the brain and spinal cord (meningitis). Pneumococcal pneumonia is most common among adults. Pneumococcal meningitis can cause deafness and brain damage, and it kills about 1 child in 10 who get it. Anyone can get pneumococcal disease, but children under 28 years of age and adults 43 years and older, people with certain medical conditions, and cigarette smokers are at the highest risk. Before there was a vaccine, the [place] saw:  more than 700 cases of meningitis,  about 13,000 blood infections,  about 5 million ear infections, and  about 200 deaths in children under 5 each year from pneumococcal disease. Since vaccine became available, severe pneumococcal disease in these children has fallen by 88%. About 18,000 older adults die of pneumococcal disease each year in the [place]. Treatment of pneumococcal infections with penicillin and other drugs is not as effective as it used to be, because some strains of the disease have become resistant to these drugs. This makes prevention of the disease, through vaccination, even more important. 2. PCV13 vaccine Pneumococcal conjugate vaccine (called PCV13) protects against 13 types of pneumococcal bacteria. PCV13 is routinely given to children at 2, 4, 6, and 65-74 months of age. It is also recommended for children and adults 70 to 70 years of age with certain health conditions, and for all adults 64 years of age and older. Your doctor can give you details. 3. Some people should not get this vaccine Anyone who has ever had a life-threatening allergic reaction  to a dose of this vaccine, to an earlier pneumococcal vaccine called PCV7, or to any vaccine containing diphtheria toxoid (for example, DTaP), should not get PCV13. Anyone with a severe allergy to any component of PCV13 should not get the vaccine. Tell your doctor if the person being vaccinated has any severe allergies. If the person scheduled for vaccination is not feeling well, your healthcare provider might decide to reschedule the shot on another day. 4. Risks of a vaccine reaction With any medicine, including vaccines, there is a chance of reactions. These are usually mild and go away on their own, but serious reactions are also possible. Problems reported following PCV13 varied by age and dose in the series. The most common problems reported among children were:  About half became drowsy after the shot, had a temporary loss of appetite, or had redness or tenderness where the shot was given.  About 1 out of 3 had swelling where the shot was given.  About 1 out of 3 had a mild fever, and about 1 in 20 had a fever over 102.55F.  Up to about 8 out of 10 became fussy or irritable. Adults have reported pain, redness, and swelling where the shot was given; also mild fever, fatigue, headache, chills, or muscle pain. Young children who get PCV13 along with inactivated flu vaccine at the same time may be at increased risk for seizures caused by fever. Ask your doctor for more information. Problems that could happen after any vaccine:  People sometimes faint after a medical procedure, including vaccination. Sitting or lying down for about 15 minutes can help prevent fainting, and injuries caused by a fall.  Tell your doctor if you feel dizzy, or have vision changes or ringing in the ears.  Some older children and adults get severe pain in the shoulder and have difficulty moving the arm where a shot was given. This happens very rarely.  Any medication can cause a severe allergic reaction. Such  reactions from a vaccine are very rare, estimated at about 1 in a million doses, and would happen within a few minutes to a few hours after the vaccination. As with any medicine, there is a very small chance of a vaccine causing a serious injury or death. The safety of vaccines is always being monitored. For more information, visit: http://floyd.org/ 5. What if there is a serious reaction? What should I look for?  Look for anything that concerns you, such as signs of a severe allergic reaction, very high fever, or unusual behavior. Signs of a severe allergic reaction can include hives, swelling of the face and throat, difficulty breathing, a fast heartbeat, dizziness, and weakness-usually within a few minutes to a few hours after the vaccination. What should I do?  If you think it is a severe allergic reaction or other emergency that can't wait, call 9-1-1 or get the person to the nearest hospital. Otherwise, call your doctor. Reactions should be reported to the Vaccine Adverse Event Reporting System (VAERS). Your doctor should file this report, or you can do it yourself through the VAERS web site at www.vaers.LAgents.no, or by calling 1-(641)389-6455. VAERS does not give medical advice. 6. The National Vaccine Injury Compensation Program The [employer] Vaccine Injury Compensation Program (VICP) is a federal program that was created to compensate people who may have been injured by certain vaccines. Persons who believe they may have been injured by a vaccine can learn about the program and about filing a claim by calling 1-(607) 470-8247 or visiting the VICP website at SpiritualWord.at. There is a time limit to file a claim for compensation. 7. How can I learn more?  Ask your healthcare provider. He or she can give you the vaccine package insert or suggest other sources of information.  Call your local or state health department.  Contact the Centers for Disease Control and  Prevention (CDC):  Call (269) 510-2328 (1-800-CDC-INFO) or  Visit CDC's website at PicCapture.uy Vaccine Information Statement PCV13 Vaccine (05/20/2014)    Rotator Cuff Injury Rotator cuff injury is any type of injury to the set of muscles and tendons that make up the stabilizing unit of your shoulder. This unit holds the ball of your upper arm bone (humerus) in the socket of your shoulder blade (scapula).  CAUSES Injuries to your rotator cuff most commonly come from sports or activities that cause your arm to be moved repeatedly over your head. Examples of this include throwing, weight lifting, swimming, or racquet sports. Long lasting (chronic) irritation of your rotator cuff can cause soreness and swelling (inflammation), bursitis, and eventual damage to your tendons, such as a tear (rupture). SIGNS AND SYMPTOMS Acute rotator cuff tear:  Sudden tearing sensation followed by severe pain shooting from your upper shoulder down your arm toward your elbow.  Decreased range of motion of your shoulder because of pain and muscle spasm.  Severe pain.  Inability to raise your arm out to the side because of pain and loss of muscle power (large tears). Chronic rotator cuff tear:  Pain that usually is worse at night and may interfere with sleep.  Gradual weakness and decreased shoulder motion as the pain worsens.  Decreased range  of motion. Rotator cuff tendinitis:  Deep ache in your shoulder and the outside upper arm over your shoulder.  Pain that comes on gradually and becomes worse when lifting your arm to the side or turning it inward. DIAGNOSIS Rotator cuff injury is diagnosed through a medical history, physical exam, and imaging exam. The medical history helps determine the type of rotator cuff injury. Your health care provider will look at your injured shoulder, feel the injured area, and ask you to move your shoulder in different positions. X-ray exams typically are done to  rule out other causes of shoulder pain, such as fractures. MRI is the exam of choice for the most severe shoulder injuries because the images show muscles and tendons.  TREATMENT  Chronic tear:  Medicine for pain, such as acetaminophen or ibuprofen.  Physical therapy and range-of-motion exercises may be helpful in maintaining shoulder function and strength.  Steroid injections into your shoulder joint.  Surgical repair of the rotator cuff if the injury does not heal with noninvasive treatment. Acute tear:  Anti-inflammatory medicines such as ibuprofen and naproxen to help reduce pain and swelling.  A sling to help support your arm and rest your rotator cuff muscles. Long-term use of a sling is not advised. It may cause significant stiffening of the shoulder joint.  Surgery may be considered within a few weeks, especially in younger, active people, to return the shoulder to full function.  Indications for surgical treatment include the following:  Age younger than 60 years.  Rotator cuff tears that are complete.  Physical therapy, rest, and anti-inflammatory medicines have been used for 6-8 weeks, with no improvement.  Employment or sporting activity that requires constant shoulder use. Tendinitis:  Anti-inflammatory medicines such as ibuprofen and naproxen to help reduce pain and swelling.  A sling to help support your arm and rest your rotator cuff muscles. Long-term use of a sling is not advised. It may cause significant stiffening of the shoulder joint.  Severe tendinitis may require:  Steroid injections into your shoulder joint.  Physical therapy.  Surgery. HOME CARE INSTRUCTIONS   Apply ice to your injury:  Put ice in a plastic bag.  Place a towel between your skin and the bag.  Leave the ice on for 20 minutes, 2-3 times a day.  If you have a shoulder immobilizer (sling and straps), wear it until told otherwise by your health care provider.  You may want to  sleep on several pillows or in a recliner at night to lessen swelling and pain.  Only take over-the-counter or prescription medicines for pain, discomfort, or fever as directed by your health care provider.  Do simple hand squeezing exercises with a soft rubber ball to decrease hand swelling. SEEK MEDICAL CARE IF:   Your shoulder pain increases, or new pain or numbness develops in your arm, hand, or fingers.  Your hand or fingers are colder than your other hand. SEEK IMMEDIATE MEDICAL CARE IF:   Your arm, hand, or fingers are numb or tingling.  Your arm, hand, or fingers are increasingly swollen and painful, or they turn white or blue. MAKE SURE YOU:  Understand these instructions.  Will watch your condition.  Will get help right away if you are not doing well or get worse.   This information is not intended to replace advice given to you by your health care provider. Make sure you discuss any questions you have with your health care provider.   Document Released: 06/29/2000 Document Revised:  07/07/2013 Document Reviewed: 02/11/2013 Elsevier Interactive Patient Education 2016 [employer] Impingement Syndrome, Rotator Cuff, Bursitis With Rehab Impingement syndrome is a condition that involves inflammation of the tendons of the rotator cuff and the subacromial bursa, that causes pain in the shoulder. The rotator cuff consists of four tendons and muscles that control much of the shoulder and upper arm function. The subacromial bursa is a fluid filled sac that helps reduce friction between the rotator cuff and one of the bones of the shoulder (acromion). Impingement syndrome is usually an overuse injury that causes swelling of the bursa (bursitis), swelling of the tendon (tendonitis), and/or a tear of the tendon (strain). Strains are classified into three categories. Grade 1 strains cause pain, but the tendon is not lengthened. Grade 2 strains include a lengthened ligament, due to the  ligament being stretched or partially ruptured. With grade 2 strains there is still function, although the function may be decreased. Grade 3 strains include a complete tear of the tendon or muscle, and function is usually impaired. SYMPTOMS   Pain around the shoulder, often at the outer portion of the upper arm.  Pain that gets worse with shoulder function, especially when reaching overhead or lifting.  Sometimes, aching when not using the arm.  Pain that wakes you up at night.  Sometimes, tenderness, swelling, warmth, or redness over the affected area.  Loss of strength.  Limited motion of the shoulder, especially reaching behind the back (to the back pocket or to unhook bra) or across your body.  Crackling sound (crepitation) when moving the arm.  Biceps tendon pain and inflammation (in the front of the shoulder). Worse when bending the elbow or lifting. CAUSES  Impingement syndrome is often an overuse injury, in which chronic (repetitive) motions cause the tendons or bursa to become inflamed. A strain occurs when a force is paced on the tendon or muscle that is greater than it can withstand. Common mechanisms of injury include: Stress from sudden increase in duration, frequency, or intensity of training.  Direct hit (trauma) to the shoulder.  Aging, erosion of the tendon with normal use.  Bony bump on shoulder (acromial spur). RISK INCREASES WITH:  Contact sports (football, wrestling, boxing).  Throwing sports (baseball, tennis, volleyball).  Weightlifting and bodybuilding.  Heavy labor.  Previous injury to the rotator cuff, including impingement.  Poor shoulder strength and flexibility.  Failure to warm up properly before activity.  Inadequate protective equipment.  Old age.  Bony bump on shoulder (acromial spur). PREVENTION   Warm up and stretch properly before activity.  Allow for adequate recovery between workouts.  Maintain physical  fitness:  Strength, flexibility, and endurance.  Cardiovascular fitness.  Learn and use proper exercise technique. PROGNOSIS  If treated properly, impingement syndrome usually goes away within 6 weeks. Sometimes surgery is required.  RELATED COMPLICATIONS   Longer healing time if not properly treated, or if not given enough time to heal.  Recurring symptoms, that result in a chronic condition.  Shoulder stiffness, frozen shoulder, or loss of motion.  Rotator cuff tendon tear.  Recurring symptoms, especially if activity is resumed too soon, with overuse, with a direct blow, or when using poor technique. TREATMENT  Treatment first involves the use of ice and medicine, to reduce pain and inflammation. The use of strengthening and stretching exercises may help reduce pain with activity. These exercises may be performed at home or with a therapist. If non-surgical treatment is unsuccessful after more than 6 months, surgery may be  advised. After surgery and rehabilitation, activity is usually possible in 3 months.  MEDICATION  If pain medicine is needed, nonsteroidal anti-inflammatory medicines (aspirin and ibuprofen), or other minor pain relievers (acetaminophen), are often advised.  Do not take pain medicine for 7 days before surgery.  Prescription pain relievers may be given, if your caregiver thinks they are needed. Use only as directed and only as much as you need.  Corticosteroid injections may be given by your caregiver. These injections should be reserved for the most serious cases, because they may only be given a certain number of times. HEAT AND COLD  Cold treatment (icing) should be applied for 10 to 15 minutes every 2 to 3 hours for inflammation and pain, and immediately after activity that aggravates your symptoms. Use ice packs or an ice massage.  Heat treatment may be used before performing stretching and strengthening activities prescribed by your caregiver, physical  therapist, or athletic trainer. Use a heat pack or a warm water soak. SEEK MEDICAL CARE IF:   Symptoms get worse or do not improve in 4 to 6 weeks, despite treatment.  New, unexplained symptoms develop. (Drugs used in treatment may produce side effects.) EXERCISES  RANGE OF MOTION (ROM) AND STRETCHING EXERCISES - Impingement Syndrome (Rotator Cuff  Tendinitis, Bursitis) These exercises may help you when beginning to rehabilitate your injury. Your symptoms may go away with or without further involvement from your physician, physical therapist or athletic trainer. While completing these exercises, remember:   Restoring tissue flexibility helps normal motion to return to the joints. This allows healthier, less painful movement and activity.  An effective stretch should be held for at least 30 seconds.  A stretch should never be painful. You should only feel a gentle lengthening or release in the stretched tissue. STRETCH - Flexion, Standing  Stand with good posture. With an underhand grip on your right / left hand, and an overhand grip on the opposite hand, grasp a broomstick or cane so that your hands are a little more than shoulder width apart.  Keeping your right / left elbow straight and shoulder muscles relaxed, push the stick with your opposite hand, to raise your right / left arm in front of your body and then overhead. Raise your arm until you feel a stretch in your right / left shoulder, but before you have increased shoulder pain.  Try to avoid shrugging your right / left shoulder as your arm rises, by keeping your shoulder blade tucked down and toward your mid-back spine. Hold for __________ seconds.  Slowly return to the starting position. Repeat __________ times. Complete this exercise __________ times per day. STRETCH - Abduction, Supine  Lie on your back. With an underhand grip on your right / left hand and an overhand grip on the opposite hand, grasp a broomstick or cane so  that your hands are a little more than shoulder width apart.  Keeping your right / left elbow straight and your shoulder muscles relaxed, push the stick with your opposite hand, to raise your right / left arm out to the side of your body and then overhead. Raise your arm until you feel a stretch in your right / left shoulder, but before you have increased shoulder pain.  Try to avoid shrugging your right / left shoulder as your arm rises, by keeping your shoulder blade tucked down and toward your mid-back spine. Hold for __________ seconds.  Slowly return to the starting position. Repeat __________ times. Complete this exercise  __________ times per day. ROM - Flexion, Active-Assisted  Lie on your back. You may bend your knees for comfort.  Grasp a broomstick or cane so your hands are about shoulder width apart. Your right / left hand should grip the end of the stick, so that your hand is positioned "thumbs-up," as if you were about to shake hands.  Using your healthy arm to lead, raise your right / left arm overhead, until you feel a gentle stretch in your shoulder. Hold for __________ seconds.  Use the stick to assist in returning your right / left arm to its starting position. Repeat __________ times. Complete this exercise __________ times per day.  ROM - Internal Rotation, Supine   Lie on your back on a firm surface. Place your right / left elbow about 60 degrees away from your side. Elevate your elbow with a folded towel, so that the elbow and shoulder are the same height.  Using a broomstick or cane and your strong arm, pull your right / left hand toward your body until you feel a gentle stretch, but no increase in your shoulder pain. Keep your shoulder and elbow in place throughout the exercise.  Hold for __________ seconds. Slowly return to the starting position. Repeat __________ times. Complete this exercise __________ times per day. STRETCH - Internal Rotation  Place your right  / left hand behind your back, palm up.  Throw a towel or belt over your opposite shoulder. Grasp the towel with your right / left hand.  While keeping an upright posture, gently pull up on the towel, until you feel a stretch in the front of your right / left shoulder.  Avoid shrugging your right / left shoulder as your arm rises, by keeping your shoulder blade tucked down and toward your mid-back spine.  Hold for __________ seconds. Release the stretch, by lowering your healthy hand. Repeat __________ times. Complete this exercise __________ times per day. ROM - Internal Rotation   Using an underhand grip, grasp a stick behind your back with both hands.  While standing upright with good posture, slide the stick up your back until you feel a mild stretch in the front of your shoulder.  Hold for __________ seconds. Slowly return to your starting position. Repeat __________ times. Complete this exercise __________ times per day.  STRETCH - Posterior Shoulder Capsule   Stand or sit with good posture. Grasp your right / left elbow and draw it across your chest, keeping it at the same height as your shoulder.  Pull your elbow, so your upper arm comes in closer to your chest. Pull until you feel a gentle stretch in the back of your shoulder.  Hold for __________ seconds. Repeat __________ times. Complete this exercise __________ times per day. STRENGTHENING EXERCISES - Impingement Syndrome (Rotator Cuff Tendinitis, Bursitis) These exercises may help you when beginning to rehabilitate your injury. They may resolve your symptoms with or without further involvement from your physician, physical therapist or athletic trainer. While completing these exercises, remember:  Muscles can gain both the endurance and the strength needed for everyday activities through controlled exercises.  Complete these exercises as instructed by your physician, physical therapist or athletic trainer. Increase the  resistance and repetitions only as guided.  You may experience muscle soreness or fatigue, but the pain or discomfort you are trying to eliminate should never worsen during these exercises. If this pain does get worse, stop and make sure you are following the directions exactly. If the  pain is still present after adjustments, discontinue the exercise until you can discuss the trouble with your clinician.  During your recovery, avoid activity or exercises which involve actions that place your injured hand or elbow above your head or behind your back or head. These positions stress the tissues which you are trying to heal. STRENGTH - Scapular Depression and Adduction   With good posture, sit on a firm chair. Support your arms in front of you, with pillows, arm rests, or on a table top. Have your elbows in line with the sides of your body.  Gently draw your shoulder blades down and toward your mid-back spine. Gradually increase the tension, without tensing the muscles along the top of your shoulders and the back of your neck.  Hold for __________ seconds. Slowly release the tension and relax your muscles completely before starting the next repetition.  After you have practiced this exercise, remove the arm support and complete the exercise in standing as well as sitting position. Repeat __________ times. Complete this exercise __________ times per day.  STRENGTH - Shoulder Abductors, Isometric  With good posture, stand or sit about 4-6 inches from a wall, with your right / left side facing the wall.  Bend your right / left elbow. Gently press your right / left elbow into the wall. Increase the pressure gradually, until you are pressing as hard as you can, without shrugging your shoulder or increasing any shoulder discomfort.  Hold for __________ seconds.  Release the tension slowly. Relax your shoulder muscles completely before you begin the next repetition. Repeat __________ times. Complete  this exercise __________ times per day.  STRENGTH - External Rotators, Isometric  Keep your right / left elbow at your side and bend it 90 degrees.  Step into a door frame so that the outside of your right / left wrist can press against the door frame without your upper arm leaving your side.  Gently press your right / left wrist into the door frame, as if you were trying to swing the back of your hand away from your stomach. Gradually increase the tension, until you are pressing as hard as you can, without shrugging your shoulder or increasing any shoulder discomfort.  Hold for __________ seconds.  Release the tension slowly. Relax your shoulder muscles completely before you begin the next repetition. Repeat __________ times. Complete this exercise __________ times per day.  STRENGTH - Supraspinatus   Stand or sit with good posture. Grasp a __________ weight, or an exercise band or tubing, so that your hand is "thumbs-up," like you are shaking hands.  Slowly lift your right / left arm in a "V" away from your thigh, diagonally into the space between your side and straight ahead. Lift your hand to shoulder height or as far as you can, without increasing any shoulder pain. At first, many people do not lift their hands above shoulder height.  Avoid shrugging your right / left shoulder as your arm rises, by keeping your shoulder blade tucked down and toward your mid-back spine.  Hold for __________ seconds. Control the descent of your hand, as you slowly return to your starting position. Repeat __________ times. Complete this exercise __________ times per day.  STRENGTH - External Rotators  Secure a rubber exercise band or tubing to a fixed object (table, pole) so that it is at the same height as your right / left elbow when you are standing or sitting on a firm surface.  Stand or sit so  that the secured exercise band is at your uninjured side.  Bend your right / left elbow 90 degrees. Place  a folded towel or small pillow under your right / left arm, so that your elbow is a few inches away from your side.  Keeping the tension on the exercise band, pull it away from your body, as if pivoting on your elbow. Be sure to keep your body steady, so that the movement is coming only from your rotating shoulder.  Hold for __________ seconds. Release the tension in a controlled manner, as you return to the starting position. Repeat __________ times. Complete this exercise __________ times per day.  STRENGTH - Internal Rotators   Secure a rubber exercise band or tubing to a fixed object (table, pole) so that it is at the same height as your right / left elbow when you are standing or sitting on a firm surface.  Stand or sit so that the secured exercise band is at your right / left side.  Bend your elbow 90 degrees. Place a folded towel or small pillow under your right / left arm so that your elbow is a few inches away from your side.  Keeping the tension on the exercise band, pull it across your body, toward your stomach. Be sure to keep your body steady, so that the movement is coming only from your rotating shoulder.  Hold for __________ seconds. Release the tension in a controlled manner, as you return to the starting position. Repeat __________ times. Complete this exercise __________ times per day.  STRENGTH - Scapular Protractors, Standing   Stand arms length away from a wall. Place your hands on the wall, keeping your elbows straight.  Begin by dropping your shoulder blades down and toward your mid-back spine.  To strengthen your protractors, keep your shoulder blades down, but slide them forward on your rib cage. It will feel as if you are lifting the back of your rib cage away from the wall. This is a subtle motion and can be challenging to complete. Ask your caregiver for further instruction, if you are not sure you are doing the exercise correctly.  Hold for __________ seconds.  Slowly return to the starting position, resting the muscles completely before starting the next repetition. Repeat __________ times. Complete this exercise __________ times per day. STRENGTH - Scapular Protractors, Supine  Lie on your back on a firm surface. Extend your right / left arm straight into the air while holding a __________ weight in your hand.  Keeping your head and back in place, lift your shoulder off the floor.  Hold for __________ seconds. Slowly return to the starting position, and allow your muscles to relax completely before starting the next repetition. Repeat __________ times. Complete this exercise __________ times per day. STRENGTH - Scapular Protractors, Quadruped  Get onto your hands and knees, with your shoulders directly over your hands (or as close as you can be, comfortably).  Keeping your elbows locked, lift the back of your rib cage up into your shoulder blades, so your mid-back rounds out. Keep your neck muscles relaxed.  Hold this position for __________ seconds. Slowly return to the starting position and allow your muscles to relax completely before starting the next repetition. Repeat __________ times. Complete this exercise __________ times per day.  STRENGTH - Scapular Retractors  Secure a rubber exercise band or tubing to a fixed object (table, pole), so that it is at the height of your shoulders when you are  either standing, or sitting on a firm armless chair.  With a palm down grip, grasp an end of the band in each hand. Straighten your elbows and lift your hands straight in front of you, at shoulder height. Step back, away from the secured end of the band, until it becomes tense.  Squeezing your shoulder blades together, draw your elbows back toward your sides, as you bend them. Keep your upper arms lifted away from your body throughout the exercise.  Hold for __________ seconds. Slowly ease the tension on the band, as you reverse the directions and  return to the starting position. Repeat __________ times. Complete this exercise __________ times per day. STRENGTH - Shoulder Extensors   Secure a rubber exercise band or tubing to a fixed object (table, pole) so that it is at the height of your shoulders when you are either standing, or sitting on a firm armless chair.  With a thumbs-up grip, grasp an end of the band in each hand. Straighten your elbows and lift your hands straight in front of you, at shoulder height. Step back, away from the secured end of the band, until it becomes tense.  Squeezing your shoulder blades together, pull your hands down to the sides of your thighs. Do not allow your hands to go behind you.  Hold for __________ seconds. Slowly ease the tension on the band, as you reverse the directions and return to the starting position. Repeat __________ times. Complete this exercise __________ times per day.  STRENGTH - Scapular Retractors and External Rotators   Secure a rubber exercise band or tubing to a fixed object (table, pole) so that it is at the height as your shoulders, when you are either standing, or sitting on a firm armless chair.  With a palm down grip, grasp an end of the band in each hand. Bend your elbows 90 degrees and lift your elbows to shoulder height, at your sides. Step back, away from the secured end of the band, until it becomes tense.  Squeezing your shoulder blades together, rotate your shoulders so that your upper arms and elbows remain stationary, but your fists travel upward to head height.  Hold for __________ seconds. Slowly ease the tension on the band, as you reverse the directions and return to the starting position. Repeat __________ times. Complete this exercise __________ times per day.  STRENGTH - Scapular Retractors and External Rotators, Rowing   Secure a rubber exercise band or tubing to a fixed object (table, pole) so that it is at the height of your shoulders, when you are  either standing, or sitting on a firm armless chair.  With a palm down grip, grasp an end of the band in each hand. Straighten your elbows and lift your hands straight in front of you, at shoulder height. Step back, away from the secured end of the band, until it becomes tense.  Step 1: Squeeze your shoulder blades together. Bending your elbows, draw your hands to your chest, as if you are rowing a boat. At the end of this motion, your hands and elbow should be at shoulder height and your elbows should be out to your sides.  Step 2: Rotate your shoulders, to raise your hands above your head. Your forearms should be vertical and your upper arms should be horizontal.  Hold for __________ seconds. Slowly ease the tension on the band, as you reverse the directions and return to the starting position. Repeat __________ times. Complete this exercise __________  times per day.  STRENGTH - Scapular Depressors  Find a sturdy chair without wheels, such as a dining room chair.  Keeping your feet on the floor, and your hands on the chair arms, lift your bottom up from the seat, and lock your elbows.  Keeping your elbows straight, allow gravity to pull your body weight down. Your shoulders will rise toward your ears.  Raise your body against gravity by drawing your shoulder blades down your back, shortening the distance between your shoulders and ears. Although your feet should always maintain contact with the floor, your feet should progressively support less body weight, as you get stronger.  Hold for __________ seconds. In a controlled and slow manner, lower your body weight to begin the next repetition. Repeat __________ times. Complete this exercise __________ times per day.    This information is not intended to replace advice given to you by your health care provider. Make sure you discuss any questions you have with your health care provider.   Document Released: 07/02/2005 Document Revised:  07/23/2014 Document Reviewed: 10/14/2008 Elsevier Interactive Patient Education [employer]

## 2015-11-11 NOTE — Addendum Note (Signed)
Addended by: Gabriel CirriWICKER,  on: 11/11/2015 11:50 AM   Modules accepted: Orders

## 2015-11-11 NOTE — Telephone Encounter (Signed)
Called and spoke to patient. Patient stated she has already been called about colonoscopy. I gave the patient the number to [facility] to schedule her bone density and mammogram.

## 2015-11-11 NOTE — Assessment & Plan Note (Addendum)
Reviewed insurance formulary.  Omeprazole interferes wit Plavix.  Continue Protonix

## 2015-11-11 NOTE — Telephone Encounter (Signed)
Called and left patient a voicemail asking for her to please return my call.  

## 2015-11-11 NOTE — Telephone Encounter (Signed)
Gastroenterology Pre-Procedure Review  Request Date: 12/27/15 Requesting Physician: Gabriel Cirriheryl Wicker, NP  PATIENT REVIEW QUESTIONS: The patient responded to the following health history questions as indicated:    1. Are you having any GI issues? no 2. Do you have a personal history of Polyps? yes (5 years ago) 3. Do you have a family history of Colon Cancer or Polyps? no 4. Diabetes Mellitus? no 5. Joint replacements in the past 12 months?no 6. Major health problems in the past 3 months?no 7. Any artificial heart valves, MVP, or defibrillator?no    MEDICATIONS & ALLERGIES:    Patient reports the following regarding taking any anticoagulation/antiplatelet therapy:   Plavix, Coumadin, Eliquis, Xarelto, Lovenox, Pradaxa, Brilinta, or Effient? yes (Plavix 75mg  ) Aspirin? yes (ASA 81mg )  Patient confirms/reports the following medications:  Current Outpatient Prescriptions  Medication Sig Dispense Refill  . aspirin 81 MG tablet Take 81 mg by mouth daily.    . clopidogrel (PLAVIX) 75 MG tablet Take 1 tablet (75 mg total) by mouth daily. 90 tablet 1  . lisinopril-hydrochlorothiazide (PRINZIDE,ZESTORETIC) 10-12.5 MG tablet Take 1 tablet by mouth daily. 90 tablet 1  . pantoprazole (PROTONIX) 40 MG tablet Take 1 tablet (40 mg total) by mouth daily. Cannot take Omeprazole due to Plavix.  Ignore Omeprazole rx 90 tablet 3  . rosuvastatin (CRESTOR) 10 MG tablet Take 1 tablet (10 mg total) by mouth daily. 90 tablet 3   No current facility-administered medications for this visit.    Patient confirms/reports the following allergies:  Allergies  Allergen Reactions  . Citalopram Hydrobromide Nausea And Vomiting  . Codeine Diarrhea and Nausea And Vomiting  . Penicillin G Benzathine     No orders of the defined types were placed in this encounter.    AUTHORIZATION INFORMATION Primary Insurance: 1D#: Group #:  Secondary Insurance: 1D#: Group #:  SCHEDULE INFORMATION: Date:  12/27/15 Time: Location: [facility]

## 2015-11-12 LAB — COMPREHENSIVE METABOLIC PANEL
ALBUMIN: 4.4 g/dL (ref 3.6–4.8)
ALT: 13 IU/L (ref 0–32)
AST: 15 IU/L (ref 0–40)
Albumin/Globulin Ratio: 1.6 (ref 1.2–2.2)
Alkaline Phosphatase: 66 IU/L (ref 39–117)
BUN/Creatinine Ratio: 9 — ABNORMAL LOW (ref 12–28)
BUN: 10 mg/dL (ref 8–27)
Bilirubin Total: 0.4 mg/dL (ref 0.0–1.2)
CALCIUM: 9.2 mg/dL (ref 8.7–10.3)
CHLORIDE: 90 mmol/L — AB (ref 96–106)
CO2: 24 mmol/L (ref 18–29)
Creatinine, Ser: 1.07 mg/dL — ABNORMAL HIGH (ref 0.57–1.00)
GFR calc non Af Amer: 55 mL/min/{1.73_m2} — ABNORMAL LOW (ref >59)
GFR, EST AFRICAN AMERICAN: 63 mL/min/{1.73_m2} (ref >59)
GLOBULIN, TOTAL: 2.7 g/dL (ref 1.5–4.5)
Glucose: 98 mg/dL (ref 65–99)
Potassium: 3.9 mmol/L (ref 3.5–5.2)
Sodium: 131 mmol/L — ABNORMAL LOW (ref 134–144)
TOTAL PROTEIN: 7.1 g/dL (ref 6.0–8.5)

## 2015-11-12 LAB — LIPID PANEL W/O CHOL/HDL RATIO
CHOLESTEROL TOTAL: 162 mg/dL (ref 100–199)
HDL: 61 mg/dL (ref >39)
LDL Calculated: 74 mg/dL (ref 0–99)
Triglycerides: 135 mg/dL (ref 0–149)
VLDL CHOLESTEROL CAL: 27 mg/dL (ref 5–40)

## 2015-11-12 LAB — HEPATITIS C ANTIBODY

## 2015-11-14 ENCOUNTER — Encounter: Payer: Self-pay | Admitting: Unknown Physician Specialty

## 2015-11-14 NOTE — Progress Notes (Signed)
Quick Note:    Patient notified by letter

## 2015-11-16 LAB — IGP, APTIMA HPV, RFX 16/18,45
HPV APTIMA: NEGATIVE
PAP SMEAR COMMENT: 0

## 2015-11-28 ENCOUNTER — Telehealth: Payer: Self-pay | Admitting: Gastroenterology

## 2015-11-28 NOTE — Telephone Encounter (Signed)
Needs to move colonoscopy appointment that's on 6/13. She was told on the 5th that you would call her back.

## 2015-11-29 NOTE — Telephone Encounter (Signed)
LVM for pt to return my call.

## 2015-11-29 NOTE — Telephone Encounter (Signed)
Patient is returning your phone call. 

## 2015-11-30 NOTE — Telephone Encounter (Signed)
You do not need to call patient back. She wants to cancel her colonoscopy on the 24th. She stated she will call us back to reschedule.

## 2015-11-30 NOTE — Telephone Encounter (Signed)
Contacted [facility] Endo unit and cancelled pt's procedure per pt request.

## 2015-12-27 ENCOUNTER — Ambulatory Visit: Admission: RE | Admit: 2015-12-27 | Payer: Self-pay | Source: Ambulatory Visit | Admitting: Gastroenterology

## 2015-12-27 ENCOUNTER — Encounter: Admission: RE | Payer: Self-pay | Source: Ambulatory Visit

## 2015-12-27 SURGERY — COLONOSCOPY WITH PROPOFOL
Anesthesia: General

## 2016-05-01 ENCOUNTER — Other Ambulatory Visit: Payer: Self-pay | Admitting: Unknown Physician Specialty

## 2016-05-04 ENCOUNTER — Other Ambulatory Visit: Payer: Self-pay

## 2016-05-04 MED ORDER — LISINOPRIL-HYDROCHLOROTHIAZIDE 10-12.5 MG PO TABS: 1.0000 | ORAL_TABLET | Freq: Every day | ORAL | 1 refills | Status: DC

## 2016-05-04 NOTE — Telephone Encounter (Signed)
rx refill, routing to provider. 

## 2016-05-22 ENCOUNTER — Ambulatory Visit: Payer: Medicare Other | Admitting: Unknown Physician Specialty

## 2016-06-06 ENCOUNTER — Encounter: Payer: Self-pay | Admitting: Unknown Physician Specialty

## 2016-06-19 ENCOUNTER — Ambulatory Visit: Payer: Medicare Other | Admitting: Unknown Physician Specialty

## 2016-06-25 ENCOUNTER — Ambulatory Visit (INDEPENDENT_AMBULATORY_CARE_PROVIDER_SITE_OTHER): Payer: Medicare Other | Admitting: Unknown Physician Specialty

## 2016-06-25 ENCOUNTER — Encounter: Payer: Self-pay | Admitting: Unknown Physician Specialty

## 2016-06-25 VITALS — BP 138/80 | HR 98 | Temp 98.4°F | Ht 67.5 in | Wt 181.4 lb

## 2016-06-25 DIAGNOSIS — E782 Mixed hyperlipidemia: Secondary | ICD-10-CM | POA: Diagnosis not present

## 2016-06-25 DIAGNOSIS — N183 Chronic kidney disease, stage 3 unspecified: Secondary | ICD-10-CM

## 2016-06-25 DIAGNOSIS — Z23 Encounter for immunization: Secondary | ICD-10-CM | POA: Diagnosis not present

## 2016-06-25 DIAGNOSIS — I10 Essential (primary) hypertension: Secondary | ICD-10-CM

## 2016-06-25 DIAGNOSIS — Z1231 Encounter for screening mammogram for malignant neoplasm of breast: Secondary | ICD-10-CM

## 2016-06-25 DIAGNOSIS — Z0000 Encounter for general adult medical examination without abnormal findings: Secondary | ICD-10-CM | POA: Diagnosis not present

## 2016-06-25 DIAGNOSIS — M810 Age-related osteoporosis without current pathological fracture: Secondary | ICD-10-CM

## 2016-06-25 DIAGNOSIS — E2839 Other primary ovarian failure: Secondary | ICD-10-CM | POA: Diagnosis not present

## 2016-06-25 MED ORDER — PANTOPRAZOLE SODIUM 40 MG PO TBEC
40.0000 mg | DELAYED_RELEASE_TABLET | Freq: Every day | ORAL | 3 refills | Status: DC
Start: 2016-06-25 — End: 2023-02-15

## 2016-06-25 MED ORDER — LISINOPRIL-HYDROCHLOROTHIAZIDE 10-12.5 MG PO TABS: 1.0000 | ORAL_TABLET | Freq: Every day | ORAL | 1 refills | Status: DC

## 2016-06-25 MED ORDER — ROSUVASTATIN CALCIUM 10 MG PO TABS: 10.0000 mg | ORAL_TABLET | Freq: Every day | ORAL | 3 refills | Status: DC

## 2016-06-25 MED ORDER — CLOPIDOGREL BISULFATE 75 MG PO TABS
75.0000 mg | ORAL_TABLET | Freq: Every day | ORAL | 1 refills | Status: DC
Start: 2016-06-25 — End: 2023-02-17

## 2016-06-25 NOTE — Assessment & Plan Note (Signed)
Recheck lipid panel 

## 2016-06-25 NOTE — Addendum Note (Signed)
Addended by: Gabriel CirriWICKER,  on: 06/25/2016 04:49 PM   Modules accepted: Orders

## 2016-06-25 NOTE — Progress Notes (Addendum)
BP 138/80 (BP Location: Left Arm, Patient Position: Sitting, Cuff Size: Large)   Pulse 98   Temp 98.4 F (36.9 C)   Ht 5' 7.5" (1.715 m)   Wt 181 lb 6.4 oz (82.3 kg)   LMP  (LMP Unknown)   SpO2 98%   BMI 27.99 kg/m    Subjective:    Patient ID: Karina HindersAmanda P Freeman, female    DOB: 04/10/1951, 65 y.o.   MRN: [id number]  HPI: Karina Hindersmanda P Boerema is a 65 y.o. female  Chief Complaint  Patient presents with  . Hyperlipidemia  . Hypertension   Hypertension Using medications without difficulty Average home BPs: About like here  No problems or lightheadedness No chest pain with exertion or shortness of breath No Edema   Hyperlipidemia Using medications without problems: No Muscle aches  Diet compliance: It's the holidays Exercise: uses a stationary bike  Health maintenance items lost to f/u  Relevant past medical, surgical, family and social history reviewed and updated as indicated. Interim medical history since our last visit reviewed. Allergies and medications reviewed and updated.  Review of Systems  Per HPI unless specifically indicated above     Objective:    BP 138/80 (BP Location: Left Arm, Patient Position: Sitting, Cuff Size: Large)   Pulse 98   Temp 98.4 F (36.9 C)   Ht 5' 7.5" (1.715 m)   Wt 181 lb 6.4 oz (82.3 kg)   LMP  (LMP Unknown)   SpO2 98%   BMI 27.99 kg/m   Wt Readings from Last 3 Encounters:  06/25/16 181 lb 6.4 oz (82.3 kg)  11/11/15 174 lb 9.6 oz (79.2 kg)  04/26/15 177 lb 9.6 oz (80.6 kg)    Physical Exam  Constitutional: She is oriented to person, place, and time. She appears well-developed and well-nourished. No distress.  HENT:  Head: Normocephalic and atraumatic.  Eyes: Conjunctivae and lids are normal. Right eye exhibits no discharge. Left eye exhibits no discharge. No scleral icterus.  Neck: Normal range of motion. Neck supple. No JVD present. Carotid bruit is not present.  Cardiovascular: Normal rate, regular rhythm and normal  heart sounds.   Pulmonary/Chest: Effort normal and breath sounds normal.  Abdominal: Normal appearance. There is no splenomegaly or hepatomegaly.  Musculoskeletal: Normal range of motion.  Neurological: She is alert and oriented to person, place, and time.  Skin: Skin is warm, dry and intact. No rash noted. No pallor.  Psychiatric: She has a normal mood and affect. Her behavior is normal. Judgment and thought content normal.    Results for orders placed or performed in visit on 11/11/15  Comprehensive metabolic panel  Result Value Ref Range   Glucose 98 65 - 99 mg/dL   BUN 10 8 - 27 mg/dL   Creatinine, Ser 1.471.07 (H) 0.57 - 1.00 mg/dL   GFR calc non Af Amer 55 (L) >59 mL/min/1.73   GFR calc Af Amer 63 >59 mL/min/1.73   BUN/Creatinine Ratio 9 (L) 12 - 28   Sodium 131 (L) 134 - 144 mmol/L   Potassium 3.9 3.5 - 5.2 mmol/L   Chloride 90 (L) 96 - 106 mmol/L   CO2 24 18 - 29 mmol/L   Calcium 9.2 8.7 - 10.3 mg/dL   Total Protein 7.1 6.0 - 8.5 g/dL   Albumin 4.4 3.6 - 4.8 g/dL   Globulin, Total 2.7 1.5 - 4.5 g/dL   Albumin/Globulin Ratio 1.6 1.2 - 2.2   Bilirubin Total 0.4 0.0 - 1.2 mg/dL  Alkaline Phosphatase 66 39 - 117 IU/L   AST 15 0 - 40 IU/L   ALT 13 0 - 32 IU/L  Lipid Panel w/o Chol/HDL Ratio  Result Value Ref Range   Cholesterol, Total 162 100 - 199 mg/dL   Triglycerides 401135 0 - 149 mg/dL   HDL 61 >02>39 mg/dL   VLDL Cholesterol Cal 27 5 - 40 mg/dL   LDL Calculated 74 0 - 99 mg/dL  Hepatitis C antibody  Result Value Ref Range   Hep C Virus Ab <0.1 0.0 - 0.9 s/co ratio  IGP, Aptima HPV, rfx 16/18,45  Result Value Ref Range   DIAGNOSIS: Comment    Specimen adequacy: Comment    CLINICIAN PROVIDED ICD10: Comment    Performed by: Comment    PAP SMEAR COMMENT .    Note: Comment    Test Methodology Comment    HPV Aptima Negative Negative      Assessment & Plan:   Problem List Items Addressed This Visit      Unprioritized   Chronic kidney disease, stage 3    Hyperlipidemia    Recheck lipid panel      Relevant Medications   lisinopril-hydrochlorothiazide (PRINZIDE,ZESTORETIC) 10-12.5 MG tablet   rosuvastatin (CRESTOR) 10 MG tablet   Hypertension    Pt refusing increase of BP meds.  Will bring in numbers from home at next visit and consider increasing next visit.  DASH diet      Relevant Medications   lisinopril-hydrochlorothiazide (PRINZIDE,ZESTORETIC) 10-12.5 MG tablet   rosuvastatin (CRESTOR) 10 MG tablet   Osteoporosis   Relevant Orders   DG Bone Density    Other Visit Diagnoses    Need for influenza vaccination    -  Primary   Relevant Orders   Flu vaccine HIGH DOSE PF (Completed)   Routine general medical examination at a health care facility       Relevant Orders   DG Bone Density   Ambulatory referral to Gastroenterology   Ovarian failure       Relevant Orders   DG Bone Density   Encounter for screening mammogram for breast cancer       Relevant Orders   MM DIGITAL SCREENING BILATERAL      Health maintenance items ordered last visit but lost to f/u.  They were reordered.    Follow up plan: Return in about 6 months (around 12/24/2016) for physical.

## 2016-06-25 NOTE — Patient Instructions (Addendum)
Influenza (Flu) Vaccine (Inactivated or Recombinant): What You Need to Know 1. Why get vaccinated? Influenza ("flu") is a contagious disease that spreads around the [place] every year, usually between October and May. Flu is caused by influenza viruses, and is spread mainly by coughing, sneezing, and close contact. Anyone can get flu. Flu strikes suddenly and can last several days. Symptoms vary by age, but can include:  fever/chills  sore throat  muscle aches  fatigue  cough  headache  runny or stuffy nose Flu can also lead to pneumonia and blood infections, and cause diarrhea and seizures in children. If you have a medical condition, such as heart or lung disease, flu can make it worse. Flu is more dangerous for some people. Infants and young children, people 65 years of age and older, pregnant women, and people with certain health conditions or a weakened immune system are at greatest risk. Each year thousands of people in the [place] die from flu, and many more are hospitalized. Flu vaccine can:  keep you from getting flu,  make flu less severe if you do get it, and  keep you from spreading flu to your family and other people. 2. Inactivated and recombinant flu vaccines A dose of flu vaccine is recommended every flu season. Children 6 months through 8 years of age may need two doses during the same flu season. Everyone else needs only one dose each flu season. Some inactivated flu vaccines contain a very small amount of a mercury-based preservative called thimerosal. Studies have not shown thimerosal in vaccines to be harmful, but flu vaccines that do not contain thimerosal are available. There is no live flu virus in flu shots. They cannot cause the flu. There are many flu viruses, and they are always changing. Each year a new flu vaccine is made to protect against three or four viruses that are likely to cause disease in the upcoming flu season. But even when the  vaccine doesn't exactly match these viruses, it may still provide some protection. Flu vaccine cannot prevent:  flu that is caused by a virus not covered by the vaccine, or  illnesses that look like flu but are not. It takes about 2 weeks for protection to develop after vaccination, and protection lasts through the flu season. 3. Some people should not get this vaccine Tell the person who is giving you the vaccine:  If you have any severe, life-threatening allergies. If you ever had a life-threatening allergic reaction after a dose of flu vaccine, or have a severe allergy to any part of this vaccine, you may be advised not to get vaccinated. Most, but not all, types of flu vaccine contain a small amount of egg protein.  If you ever had Guillain-Barr Syndrome (also called GBS). Some people with a history of GBS should not get this vaccine. This should be discussed with your doctor.  If you are not feeling well. It is usually okay to get flu vaccine when you have a mild illness, but you might be asked to come back when you feel better. 4. Risks of a vaccine reaction With any medicine, including vaccines, there is a chance of reactions. These are usually mild and go away on their own, but serious reactions are also possible. Most people who get a flu shot do not have any problems with it. Minor problems following a flu shot include:  soreness, redness, or swelling where the shot was given  hoarseness  sore, red or itchy   eyes  cough  fever  aches  headache  itching  fatigue If these problems occur, they usually begin soon after the shot and last 1 or 2 days. More serious problems following a flu shot can include the following:  There may be a small increased risk of Guillain-Barre Syndrome (GBS) after inactivated flu vaccine. This risk has been estimated at 1 or 2 additional cases per million people vaccinated. This is much lower than the risk of severe complications from flu,  which can be prevented by flu vaccine.  Young children who get the flu shot along with pneumococcal vaccine (PCV13) and/or DTaP vaccine at the same time might be slightly more likely to have a seizure caused by fever. Ask your doctor for more information. Tell your doctor if a child who is getting flu vaccine has ever had a seizure. Problems that could happen after any injected vaccine:  People sometimes faint after a medical procedure, including vaccination. Sitting or lying down for about 15 minutes can help prevent fainting, and injuries caused by a fall. Tell your doctor if you feel dizzy, or have vision changes or ringing in the ears.  Some people get severe pain in the shoulder and have difficulty moving the arm where a shot was given. This happens very rarely.  Any medication can cause a severe allergic reaction. Such reactions from a vaccine are very rare, estimated at about 1 in a million doses, and would happen within a few minutes to a few hours after the vaccination. As with any medicine, there is a very remote chance of a vaccine causing a serious injury or death. The safety of vaccines is always being monitored. For more information, visit: www.cdc.gov/vaccinesafety/ 5. What if there is a serious reaction? What should I look for? Look for anything that concerns you, such as signs of a severe allergic reaction, very high fever, or unusual behavior. Signs of a severe allergic reaction can include hives, swelling of the face and throat, difficulty breathing, a fast heartbeat, dizziness, and weakness. These would start a few minutes to a few hours after the vaccination. What should I do?  If you think it is a severe allergic reaction or other emergency that can't wait, call 9-1-1 and get the person to the nearest hospital. Otherwise, call your doctor.  Reactions should be reported to the Vaccine Adverse Event Reporting System (VAERS). Your doctor should file this report, or you can do  it yourself through the VAERS web site at www.vaers.hhs.gov, or by calling 1-800-822-7967.  VAERS does not give medical advice. 6. The National Vaccine Injury Compensation Program The National Vaccine Injury Compensation Program (VICP) is a federal program that was created to compensate people who may have been injured by certain vaccines. Persons who believe they may have been injured by a vaccine can learn about the program and about filing a claim by calling 1-800-338-2382 or visiting the VICP website at www.hrsa.gov/vaccinecompensation. There is a time limit to file a claim for compensation. 7. How can I learn more?  Ask your healthcare provider. He or she can give you the vaccine package insert or suggest other sources of information.  Call your local or state health department.  Contact the Centers for Disease Control and Prevention (CDC):  Call 1-800-232-4636 (1-800-CDC-INFO) or  Visit CDC's website at www.cdc.gov/flu Vaccine Information Statement, Inactivated Influenza Vaccine (02/19/2014) This information is not intended to replace advice given to you by your health care provider. Make sure you discuss any questions you   have with your health care provider.  DASH Eating Plan DASH stands for "Dietary Approaches to Stop Hypertension." The DASH eating plan is a healthy eating plan that has been shown to reduce high blood pressure (hypertension). Additional health benefits may include reducing the risk of type 2 diabetes mellitus, heart disease, and stroke. The DASH eating plan may also help with weight loss. What do I need to know about the DASH eating plan? For the DASH eating plan, you will follow these general guidelines:  Choose foods with less than 150 milligrams of sodium per serving (as listed on the food label).  Use salt-free seasonings or herbs instead of table salt or sea salt.  Check with your health care provider or pharmacist before using salt substitutes.  Eat  lower-sodium products. These are often labeled as "low-sodium" or "no salt added."  Eat fresh foods. Avoid eating a lot of canned foods.  Eat more vegetables, fruits, and low-fat dairy products.  Choose whole grains. Look for the word "whole" as the first word in the ingredient list.  Choose fish and skinless chicken or [place] more often than red meat. Limit fish, poultry, and meat to 6 oz (170 g) each day.  Limit sweets, desserts, sugars, and sugary drinks.  Choose heart-healthy fats.  Eat more home-cooked food and less restaurant, buffet, and fast food.  Limit fried foods.  Do not fry foods. Cook foods using methods such as baking, boiling, grilling, and broiling instead.  When eating at a restaurant, ask that your food be prepared with less salt, or no salt if possible. What foods can I eat? Seek help from a dietitian for individual calorie needs. Grains  Whole grain or whole wheat bread. Brown rice. Whole grain or whole wheat pasta. Quinoa, bulgur, and whole grain cereals. Low-sodium cereals. Corn or whole wheat flour tortillas. Whole grain cornbread. Whole grain crackers. Low-sodium crackers. Vegetables  Fresh or frozen vegetables (raw, steamed, roasted, or grilled). Low-sodium or reduced-sodium tomato and vegetable juices. Low-sodium or reduced-sodium tomato sauce and paste. Low-sodium or reduced-sodium canned vegetables. Fruits  All fresh, canned (in natural juice), or frozen fruits. Meat and Other Protein Products  Ground beef (85% or leaner), grass-fed beef, or beef trimmed of fat. Skinless chicken or [place]. Ground chicken or [place]key. Pork trimmed of fat. All fish and seafood. Eggs. Dried beans, peas, or lentils. Unsalted nuts and seeds. Unsalted canned beans. Dairy  Low-fat dairy products, such as skim or 1% milk, 2% or reduced-fat cheeses, low-fat ricotta or cottage cheese, or plain low-fat yogurt. Low-sodium or reduced-sodium cheeses. Fats and Oils  Tub margarines  without trans fats. Light or reduced-fat mayonnaise and salad dressings (reduced sodium). Avocado. Safflower, olive, or canola oils. Natural peanut or almond butter. Other  Unsalted popcorn and pretzels. The items listed above may not be a complete list of recommended foods or beverages. Contact your dietitian for more options.  What foods are not recommended? Grains  White bread. White pasta. White rice. Refined cornbread. Bagels and croissants. Crackers that contain trans fat. Vegetables  Creamed or fried vegetables. Vegetables in a cheese sauce. Regular canned vegetables. Regular canned tomato sauce and paste. Regular tomato and vegetable juices. Fruits  Canned fruit in light or heavy syrup. Fruit juice. Meat and Other Protein Products  Fatty cuts of meat. Ribs, chicken wings, bacon, sausage, bologna, salami, chitterlings, fatback, hot dogs, bratwurst, and packaged luncheon meats. Salted nuts and seeds. Canned beans with salt. Dairy  Whole or 2% milk, cream, half-and-half, and  cream cheese. Whole-fat or sweetened yogurt. Full-fat cheeses or blue cheese. Nondairy creamers and whipped toppings. Processed cheese, cheese spreads, or cheese curds. Condiments  Onion and garlic salt, seasoned salt, table salt, and sea salt. Canned and packaged gravies. Worcestershire sauce. Tartar sauce. Barbecue sauce. Teriyaki sauce. Soy sauce, including reduced sodium. Steak sauce. Fish sauce. Oyster sauce. Cocktail sauce. Horseradish. Ketchup and mustard. Meat flavorings and tenderizers. Bouillon cubes. Hot sauce. Tabasco sauce. Marinades. Taco seasonings. Relishes. Fats and Oils  Butter, stick margarine, lard, shortening, ghee, and bacon fat. Coconut, palm kernel, or palm oils. Regular salad dressings. Other  Pickles and olives. Salted popcorn and pretzels. The items listed above may not be a complete list of foods and beverages to avoid. Contact your dietitian for more information.  Where can I find more  information? National Heart, Lung, and Blood Institute: CablePromo.itwww.nhlbi.nih.gov/health/health-topics/topics/dash/ This information is not intended to replace advice given to you by your health care provider. Make sure you discuss any questions you have with your health care provider. Document Released: 06/21/2011 Document Revised: 12/08/2015 Document Reviewed: 05/06/2013 Elsevier Interactive Patient Education  2017 [employer]

## 2016-06-25 NOTE — Assessment & Plan Note (Signed)
Pt refusing increase of BP meds.  Will bring in numbers from home at next visit and consider increasing next visit.  DASH diet

## 2016-06-26 ENCOUNTER — Encounter: Payer: Self-pay | Admitting: Unknown Physician Specialty

## 2016-06-26 LAB — COMPREHENSIVE METABOLIC PANEL
A/G RATIO: 1.5 (ref 1.2–2.2)
ALBUMIN: 4.5 g/dL (ref 3.6–4.8)
ALT: 10 IU/L (ref 0–32)
AST: 14 IU/L (ref 0–40)
Alkaline Phosphatase: 63 IU/L (ref 39–117)
BILIRUBIN TOTAL: 0.3 mg/dL (ref 0.0–1.2)
BUN / CREAT RATIO: 14 (ref 12–28)
BUN: 15 mg/dL (ref 8–27)
CHLORIDE: 97 mmol/L (ref 96–106)
CO2: 25 mmol/L (ref 18–29)
Calcium: 9.5 mg/dL (ref 8.7–10.3)
Creatinine, Ser: 1.08 mg/dL — ABNORMAL HIGH (ref 0.57–1.00)
GFR calc non Af Amer: 54 mL/min/{1.73_m2} — ABNORMAL LOW (ref >59)
GFR, EST AFRICAN AMERICAN: 62 mL/min/{1.73_m2} (ref >59)
GLOBULIN, TOTAL: 3.1 g/dL (ref 1.5–4.5)
Glucose: 96 mg/dL (ref 65–99)
POTASSIUM: 4.7 mmol/L (ref 3.5–5.2)
SODIUM: 140 mmol/L (ref 134–144)
TOTAL PROTEIN: 7.6 g/dL (ref 6.0–8.5)

## 2016-06-26 LAB — LIPID PANEL W/O CHOL/HDL RATIO
Cholesterol, Total: 191 mg/dL (ref 100–199)
HDL: 63 mg/dL (ref >39)
LDL Calculated: 92 mg/dL (ref 0–99)
Triglycerides: 179 mg/dL — ABNORMAL HIGH (ref 0–149)
VLDL Cholesterol Cal: 36 mg/dL (ref 5–40)

## 2016-06-26 NOTE — Progress Notes (Signed)
Normal labs.  Patient notified by letter.

## 2016-07-27 ENCOUNTER — Ambulatory Visit: Payer: Self-pay | Admitting: Family Medicine

## 2016-07-27 ENCOUNTER — Ambulatory Visit: Payer: Medicare Other | Admitting: Family Medicine

## 2016-08-06 ENCOUNTER — Telehealth: Payer: Self-pay | Admitting: Unknown Physician Specialty

## 2016-08-06 ENCOUNTER — Other Ambulatory Visit: Payer: Self-pay | Admitting: Unknown Physician Specialty

## 2016-08-06 MED ORDER — ATORVASTATIN CALCIUM 20 MG PO TABS: 20.0000 mg | ORAL_TABLET | Freq: Every day | ORAL | 3 refills | Status: DC

## 2016-08-06 NOTE — Telephone Encounter (Signed)
Routing to provider. Patient would like RX printed.

## 2016-08-06 NOTE — Telephone Encounter (Signed)
Pt called and stated insurance will not cover rosuvastatin (CRESTOR) 10 MG tablet and would like it to be changed to atorvastatin, fimvastatin, pravastatin or lovastatin. Pt would like to pick up rx.

## 2016-08-24 ENCOUNTER — Other Ambulatory Visit: Payer: Self-pay

## 2016-08-24 ENCOUNTER — Telehealth: Payer: Self-pay

## 2016-08-24 NOTE — Telephone Encounter (Signed)
Gastroenterology Pre-Procedure Review  Request Date:  Requesting Physician: Dr.   PATIENT REVIEW QUESTIONS: The patient responded to the following health history questions as indicated:    1. Are you having any GI issues? no 2. 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 Do you have a family history of Colon Cancer or Polyps? no 4. Diabetes Mellitus? no 5. Joint replacements in the past 12 months?no 6. Major health problems in the past 3 months?no 7. 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 yes (Plavix 75mg ) Aspirin? yes (ASA 81mg )  Patient confirms/reports the following medications:  Current Outpatient Prescriptions  Medication Sig Dispense Refill  . aspirin 81 MG tablet Take 81 mg by mouth daily.    Marland Kitchen. atorvastatin (LIPITOR) 20 MG tablet Take 1 tablet (20 mg total) by mouth daily. 90 tablet 3  . clopidogrel (PLAVIX) 75 MG tablet Take 1 tablet (75 mg total) by mouth daily. 90 tablet 1  . lisinopril-hydrochlorothiazide (PRINZIDE,ZESTORETIC) 10-12.5 MG tablet Take 1 tablet by mouth daily. 90 tablet 1  . pantoprazole (PROTONIX) 40 MG tablet Take 1 tablet (40 mg total) by mouth daily. Cannot take Omeprazole due to Plavix.  Ignore Omeprazole rx 90 tablet 3   No current facility-administered medications for this visit.     Patient confirms/reports the following allergies:  Allergies  Allergen Reactions  . Citalopram Hydrobromide Nausea And Vomiting  . Codeine Diarrhea and Nausea And Vomiting  . Penicillin G Benzathine     No orders of the defined types were placed in this encounter.   AUTHORIZATION INFORMATION Primary Insurance: 1D#: Group #:  Secondary Insurance: 1D#: Group #:  SCHEDULE INFORMATION: Date:  10/12/16 Location: [facility]

## 2016-10-09 ENCOUNTER — Telehealth: Payer: Self-pay | Admitting: Gastroenterology

## 2016-10-09 NOTE — Telephone Encounter (Signed)
Patient called and has to cancel her colonoscopy on 10/12/16 due to a family emergency. She will call back later to r/s.

## 2016-10-12 ENCOUNTER — Ambulatory Visit: Admit: 2016-10-12 | Payer: Self-pay | Admitting: Gastroenterology

## 2016-10-12 SURGERY — COLONOSCOPY WITH PROPOFOL
Anesthesia: General

## 2016-10-16 ENCOUNTER — Other Ambulatory Visit: Payer: Self-pay

## 2016-11-12 ENCOUNTER — Encounter: Payer: Medicare Other | Admitting: Unknown Physician Specialty

## 2017-02-02 ENCOUNTER — Other Ambulatory Visit: Payer: Self-pay | Admitting: Unknown Physician Specialty

## 2017-02-21 ENCOUNTER — Telehealth: Payer: Self-pay | Admitting: Unknown Physician Specialty

## 2017-02-21 NOTE — Telephone Encounter (Signed)
Called pt to schedule for Annual Wellness Visit with Nurse Health Advisor, Tiffany Hill, my c/b # is [phone]  Kathryn Brown ° °

## 2017-03-20 ENCOUNTER — Telehealth: Payer: Self-pay | Admitting: Unknown Physician Specialty

## 2017-03-20 NOTE — Telephone Encounter (Signed)
Called pt to schedule for Annual Wellness Visit with Nurse Health Advisor, Tiffany Hill, my c/b # is [phone]  Kathryn Brown ° °

## 2017-04-19 ENCOUNTER — Telehealth: Payer: Self-pay | Admitting: Unknown Physician Specialty

## 2017-04-19 NOTE — Telephone Encounter (Signed)
Called pt to sched for Annual Wellness Visit with Nurse Health Advisor. C/b #  [phone]  Kathryn Brown ° °

## 2017-05-09 NOTE — Telephone Encounter (Signed)
[facility] , left message for her to call back.

## 2017-08-14 ENCOUNTER — Other Ambulatory Visit: Payer: Self-pay | Admitting: Unknown Physician Specialty

## 2018-09-23 ENCOUNTER — Telehealth: Payer: Self-pay | Admitting: Unknown Physician Specialty

## 2018-09-23 NOTE — Telephone Encounter (Signed)
Copied from CRM [id number]. Topic: Medicare AWV >> Sep 23, 2018  2:54 PM Earlyne Iba wrote: Called to schedule Medicare Annual Wellness Visit with the Nurse Health Advisor. No answer at home phone number and mobile number was incorrect.  No machine to leave a message at the home phone number.  I removed the mobile phone number from the computer.  If patient returns call, please note: their last AWV was on 11/11/2015, please schedule AWV with NHA any date AFTER 11/10/2016  Thank you! For any questions please contact: Trixie Rude at [phone] or Skype [email or web address]

## 2019-02-16 ENCOUNTER — Other Ambulatory Visit: Payer: Self-pay

## 2019-06-09 ENCOUNTER — Other Ambulatory Visit: Payer: Self-pay | Admitting: Internal Medicine

## 2019-06-09 DIAGNOSIS — Z1231 Encounter for screening mammogram for malignant neoplasm of breast: Secondary | ICD-10-CM

## 2019-06-17 ENCOUNTER — Other Ambulatory Visit: Payer: Self-pay | Admitting: Internal Medicine

## 2019-06-17 ENCOUNTER — Ambulatory Visit
Admission: RE | Admit: 2019-06-17 | Discharge: 2019-06-17 | Disposition: A | Discharge status: Home / Self Care | Payer: Medicare Other | Source: Ambulatory Visit | Attending: Internal Medicine | Admitting: Internal Medicine

## 2019-06-17 ENCOUNTER — Other Ambulatory Visit: Payer: Self-pay

## 2019-06-17 DIAGNOSIS — Z1231 Encounter for screening mammogram for malignant neoplasm of breast: Secondary | ICD-10-CM

## 2019-06-17 DIAGNOSIS — N6310 Unspecified lump in the right breast, unspecified quadrant: Secondary | ICD-10-CM

## 2019-06-26 ENCOUNTER — Ambulatory Visit
Admission: RE | Admit: 2019-06-26 | Discharge: 2019-06-26 | Disposition: A | Discharge status: Home / Self Care | Payer: Medicare Other | Source: Ambulatory Visit | Attending: Internal Medicine | Admitting: Internal Medicine

## 2019-06-26 DIAGNOSIS — N6310 Unspecified lump in the right breast, unspecified quadrant: Secondary | ICD-10-CM

## 2019-06-26 DIAGNOSIS — N6314 Unspecified lump in the right breast, lower inner quadrant: Secondary | ICD-10-CM | POA: Diagnosis present

## 2020-08-09 ENCOUNTER — Other Ambulatory Visit: Payer: Medicare Other

## 2020-08-09 DIAGNOSIS — Z20822 Contact with and (suspected) exposure to covid-19: Secondary | ICD-10-CM

## 2020-08-10 LAB — NOVEL CORONAVIRUS, NAA: SARS-CoV-2, NAA: NOT DETECTED

## 2020-08-10 LAB — SARS-COV-2, NAA 2 DAY TAT

## 2021-05-24 IMAGING — US US BREAST*R* LIMITED INC AXILLA
1 series · 1 of 1 positions shown · non-contrast
Comparison: Previous exam(s).

CLINICAL DATA: Patient presents for bilateral diagnostic
examination due to a palpable abnormality over the slightly inner
lower right breast.

EXAM:
DIGITAL DIAGNOSTIC bilateral MAMMOGRAM WITH CAD AND TOMO
ULTRASOUND right BREAST

[Series 1: us breast*right* limited inc axilla · 0.07mm/px · 1 of 1 slices shown]
[im 1/1]
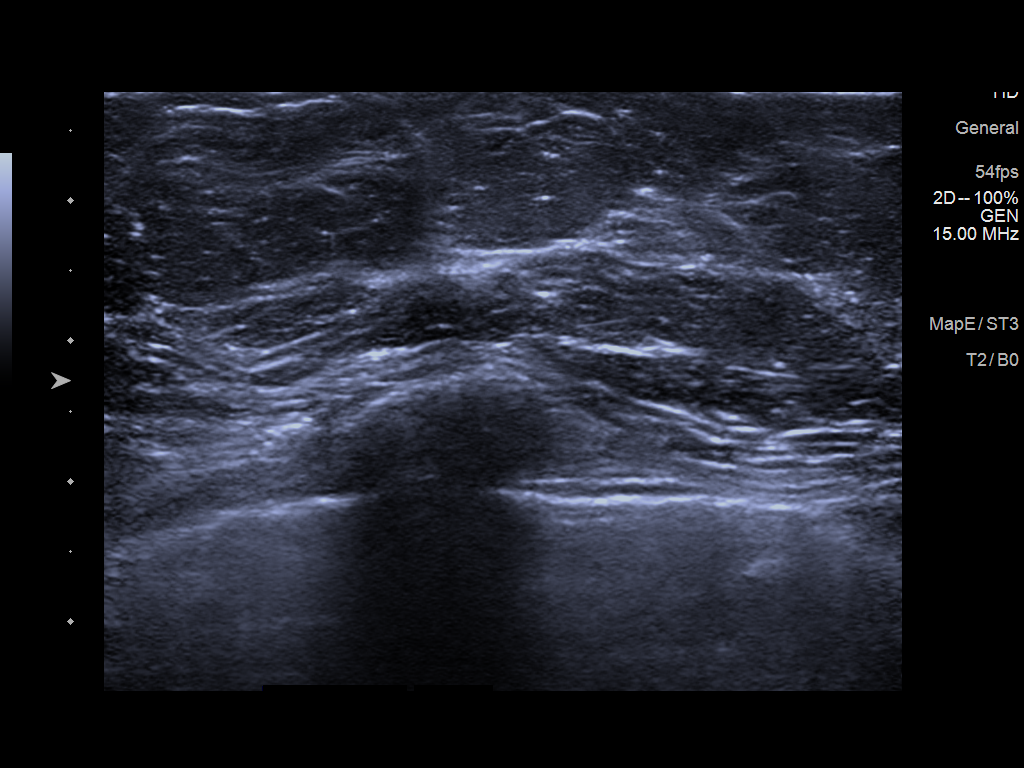

[1 of 1 positions shown; findings below may reference images not displayed]

ACR Breast Density Category b: There are scattered areas of
fibroglandular density.
FINDINGS: Examination demonstrates no focal abnormality over the inner lower
right breast to account for patient's palpable abnormality.
Remainder of the right breast as well as the left breast is
unchanged.

Mammographic images were processed with CAD.

On physical exam, I palpate mild nodularity over the inner lower
right breast without focal discrete abnormality.

Targeted ultrasound is performed, showing no focal abnormality over
the inner lower right breast to account for patient's palpable
abnormality.
IMPRESSION: No focal abnormality over the inner lower right breast to account
for patient's palpable abnormality. Otherwise, stable mammogram.

RECOMMENDATION:
Recommend continued management of patient's right breast palpable
abnormality on a clinical basis. Otherwise, recommend continued
annual bilateral screening mammographic follow-up.

I have discussed the findings and recommendations with the patient.
If applicable, a reminder letter will be sent to the patient
regarding the next appointment.

BI-RADS CATEGORY  1: Negative.

## 2023-02-13 ENCOUNTER — Encounter: Payer: Self-pay | Admitting: Internal Medicine

## 2023-02-15 ENCOUNTER — Encounter
Admission: EM | Disposition: A | Discharge status: Home / Self Care | Payer: Self-pay | Source: Home / Self Care | Attending: Internal Medicine

## 2023-02-15 ENCOUNTER — Inpatient Hospital Stay (HOSPITAL_COMMUNITY)
Admit: 2023-02-15 | Discharge: 2023-02-15 | Disposition: A | Discharge status: Home / Self Care | Payer: Medicare Other | Attending: Cardiovascular Disease | Admitting: Cardiovascular Disease

## 2023-02-15 ENCOUNTER — Inpatient Hospital Stay: Payer: Medicare Other | Admitting: Certified Registered"

## 2023-02-15 ENCOUNTER — Other Ambulatory Visit: Payer: Self-pay

## 2023-02-15 ENCOUNTER — Inpatient Hospital Stay
Admission: EM | Admit: 2023-02-15 | Discharge: 2023-02-17 | DRG: 321 | Disposition: A | Discharge status: Home / Self Care | Payer: Medicare Other | Attending: Internal Medicine | Admitting: Internal Medicine

## 2023-02-15 DIAGNOSIS — I639 Cerebral infarction, unspecified: Secondary | ICD-10-CM | POA: Diagnosis not present

## 2023-02-15 DIAGNOSIS — I2109 ST elevation (STEMI) myocardial infarction involving other coronary artery of anterior wall: Principal | ICD-10-CM | POA: Diagnosis present

## 2023-02-15 DIAGNOSIS — N183 Chronic kidney disease, stage 3 unspecified: Secondary | ICD-10-CM | POA: Diagnosis present

## 2023-02-15 DIAGNOSIS — Z888 Allergy status to other drugs, medicaments and biological substances status: Secondary | ICD-10-CM | POA: Diagnosis not present

## 2023-02-15 DIAGNOSIS — I25110 Atherosclerotic heart disease of native coronary artery with unstable angina pectoris: Secondary | ICD-10-CM

## 2023-02-15 DIAGNOSIS — I10 Essential (primary) hypertension: Secondary | ICD-10-CM | POA: Diagnosis not present

## 2023-02-15 DIAGNOSIS — Z818 Family history of other mental and behavioral disorders: Secondary | ICD-10-CM | POA: Diagnosis not present

## 2023-02-15 DIAGNOSIS — F32A Depression, unspecified: Secondary | ICD-10-CM | POA: Diagnosis not present

## 2023-02-15 DIAGNOSIS — I5021 Acute systolic (congestive) heart failure: Secondary | ICD-10-CM

## 2023-02-15 DIAGNOSIS — M810 Age-related osteoporosis without current pathological fracture: Secondary | ICD-10-CM | POA: Diagnosis present

## 2023-02-15 DIAGNOSIS — I255 Ischemic cardiomyopathy: Secondary | ICD-10-CM | POA: Diagnosis present

## 2023-02-15 DIAGNOSIS — Z885 Allergy status to narcotic agent status: Secondary | ICD-10-CM | POA: Diagnosis not present

## 2023-02-15 DIAGNOSIS — I130 Hypertensive heart and chronic kidney disease with heart failure and stage 1 through stage 4 chronic kidney disease, or unspecified chronic kidney disease: Secondary | ICD-10-CM | POA: Diagnosis not present

## 2023-02-15 DIAGNOSIS — I213 ST elevation (STEMI) myocardial infarction of unspecified site: Secondary | ICD-10-CM

## 2023-02-15 DIAGNOSIS — Z8673 Personal history of transient ischemic attack (TIA), and cerebral infarction without residual deficits: Secondary | ICD-10-CM

## 2023-02-15 DIAGNOSIS — Z9851 Tubal ligation status: Secondary | ICD-10-CM

## 2023-02-15 DIAGNOSIS — Z9049 Acquired absence of other specified parts of digestive tract: Secondary | ICD-10-CM | POA: Diagnosis not present

## 2023-02-15 DIAGNOSIS — Z880 Allergy status to penicillin: Secondary | ICD-10-CM

## 2023-02-15 DIAGNOSIS — I2510 Atherosclerotic heart disease of native coronary artery without angina pectoris: Secondary | ICD-10-CM | POA: Diagnosis not present

## 2023-02-15 DIAGNOSIS — Z79899 Other long term (current) drug therapy: Secondary | ICD-10-CM

## 2023-02-15 DIAGNOSIS — E785 Hyperlipidemia, unspecified: Secondary | ICD-10-CM | POA: Diagnosis present

## 2023-02-15 DIAGNOSIS — R7301 Impaired fasting glucose: Secondary | ICD-10-CM | POA: Diagnosis present

## 2023-02-15 DIAGNOSIS — Z8249 Family history of ischemic heart disease and other diseases of the circulatory system: Secondary | ICD-10-CM | POA: Diagnosis not present

## 2023-02-15 DIAGNOSIS — Z87891 Personal history of nicotine dependence: Secondary | ICD-10-CM | POA: Diagnosis not present

## 2023-02-15 DIAGNOSIS — K219 Gastro-esophageal reflux disease without esophagitis: Secondary | ICD-10-CM | POA: Diagnosis present

## 2023-02-15 DIAGNOSIS — E782 Mixed hyperlipidemia: Secondary | ICD-10-CM | POA: Diagnosis not present

## 2023-02-15 DIAGNOSIS — F419 Anxiety disorder, unspecified: Secondary | ICD-10-CM | POA: Diagnosis present

## 2023-02-15 DIAGNOSIS — R0789 Other chest pain: Secondary | ICD-10-CM | POA: Diagnosis not present

## 2023-02-15 DIAGNOSIS — R0689 Other abnormalities of breathing: Secondary | ICD-10-CM | POA: Diagnosis not present

## 2023-02-15 DIAGNOSIS — R0902 Hypoxemia: Secondary | ICD-10-CM | POA: Diagnosis not present

## 2023-02-15 DIAGNOSIS — I499 Cardiac arrhythmia, unspecified: Secondary | ICD-10-CM | POA: Diagnosis not present

## 2023-02-15 HISTORY — DX: ST elevation (STEMI) myocardial infarction involving other coronary artery of anterior wall: I21.09

## 2023-02-15 HISTORY — PX: CORONARY/GRAFT ACUTE MI REVASCULARIZATION: CATH118305

## 2023-02-15 HISTORY — PX: LEFT HEART CATH AND CORONARY ANGIOGRAPHY: CATH118249

## 2023-02-15 LAB — TROPONIN I (HIGH SENSITIVITY)
Troponin I (High Sensitivity): 201 ng/L (ref <18)
Troponin I (High Sensitivity): 1235 ng/L (ref <18)

## 2023-02-15 LAB — ECHOCARDIOGRAM COMPLETE
AR max vel: 3.16 cm2
AV Area VTI: 3.56 cm2
AV Area mean vel: 3.33 cm2
AV Mean grad: 2 mmHg
AV Peak grad: 3 mmHg
Ao pk vel: 0.87 m/s
Area-P 1/2: 1.92 cm2
Calc EF: 33.8 %
Height: 67.5 in
S' Lateral: 2 cm
Single Plane A2C EF: 11.8 %
Single Plane A4C EF: 49.4 %
Weight: 2613.77 oz

## 2023-02-15 LAB — LIPID PANEL
Cholesterol: 241 mg/dL — ABNORMAL HIGH (ref 0–200)
HDL: 60 mg/dL (ref >40)
LDL Cholesterol: 164 mg/dL — ABNORMAL HIGH (ref 0–99)
Total CHOL/HDL Ratio: 4 RATIO
Triglycerides: 84 mg/dL (ref <150)
VLDL: 17 mg/dL (ref 0–40)

## 2023-02-15 LAB — CBC WITH DIFFERENTIAL/PLATELET
Abs Immature Granulocytes: 0.02 10*3/uL (ref 0.00–0.07)
Basophils Absolute: 0.1 10*3/uL (ref 0.0–0.1)
Basophils Relative: 1 %
Eosinophils Absolute: 0.3 10*3/uL (ref 0.0–0.5)
Eosinophils Relative: 3 %
HCT: 39.1 % (ref 36.0–46.0)
Hemoglobin: 13.4 g/dL (ref 12.0–15.0)
Immature Granulocytes: 0 %
Lymphocytes Relative: 43 %
Lymphs Abs: 4.3 10*3/uL — ABNORMAL HIGH (ref 0.7–4.0)
MCH: 30.7 pg (ref 26.0–34.0)
MCHC: 34.3 g/dL (ref 30.0–36.0)
MCV: 89.5 fL (ref 80.0–100.0)
Monocytes Absolute: 0.5 10*3/uL (ref 0.1–1.0)
Monocytes Relative: 5 %
Neutro Abs: 4.8 10*3/uL (ref 1.7–7.7)
Neutrophils Relative %: 48 %
Platelets: 282 10*3/uL (ref 150–400)
RBC: 4.37 MIL/uL (ref 3.87–5.11)
RDW: 11.9 % (ref 11.5–15.5)
WBC: 10 10*3/uL (ref 4.0–10.5)
nRBC: 0 % (ref 0.0–0.2)

## 2023-02-15 LAB — PROTIME-INR
INR: 1.1 (ref 0.8–1.2)
Prothrombin Time: 14.6 seconds (ref 11.4–15.2)

## 2023-02-15 LAB — COMPREHENSIVE METABOLIC PANEL
ALT: 12 U/L (ref 0–44)
AST: 21 U/L (ref 15–41)
Albumin: 4 g/dL (ref 3.5–5.0)
Alkaline Phosphatase: 59 U/L (ref 38–126)
Anion gap: 14 (ref 5–15)
BUN: 15 mg/dL (ref 8–23)
CO2: 20 mmol/L — ABNORMAL LOW (ref 22–32)
Calcium: 8.9 mg/dL (ref 8.9–10.3)
Chloride: 104 mmol/L (ref 98–111)
Creatinine, Ser: 0.91 mg/dL (ref 0.44–1.00)
GFR, Estimated: >60 mL/min (ref >60)
Glucose, Bld: 115 mg/dL — ABNORMAL HIGH (ref 70–99)
Potassium: 3.1 mmol/L — ABNORMAL LOW (ref 3.5–5.1)
Sodium: 138 mmol/L (ref 135–145)
Total Bilirubin: 0.7 mg/dL (ref 0.3–1.2)
Total Protein: 7.8 g/dL (ref 6.5–8.1)

## 2023-02-15 LAB — POCT ACTIVATED CLOTTING TIME: Activated Clotting Time: 379 seconds

## 2023-02-15 LAB — APTT: aPTT: 106 seconds — ABNORMAL HIGH (ref 24–36)

## 2023-02-15 LAB — HEMOGLOBIN A1C
Hgb A1c MFr Bld: 5.4 % (ref 4.8–5.6)
Mean Plasma Glucose: 108.28 mg/dL

## 2023-02-15 LAB — MRSA NEXT GEN BY PCR, NASAL: MRSA by PCR Next Gen: NOT DETECTED

## 2023-02-15 LAB — GLUCOSE, CAPILLARY: Glucose-Capillary: 96 mg/dL (ref 70–99)

## 2023-02-15 SURGERY — CORONARY/GRAFT ACUTE MI REVASCULARIZATION
Anesthesia: Moderate Sedation

## 2023-02-15 MED ORDER — IOHEXOL 300 MG/ML  SOLN
INTRAMUSCULAR | Status: DC | PRN
  Administered 2023-02-15: 125 mL

## 2023-02-15 MED ORDER — POTASSIUM CHLORIDE CRYS ER 20 MEQ PO TBCR
40.0000 meq | EXTENDED_RELEASE_TABLET | Freq: Once | ORAL | Status: AC
  Administered 2023-02-15: 40 meq via ORAL
  Filled 2023-02-15: qty 2

## 2023-02-15 MED ORDER — CARVEDILOL 6.25 MG PO TABS: 6.2500 mg | ORAL_TABLET | Freq: Two times a day (BID) | ORAL | Status: DC

## 2023-02-15 MED ORDER — LIDOCAINE HCL (PF) 1 % IJ SOLN
INTRAMUSCULAR | Status: DC | PRN
  Administered 2023-02-15: 2 mL

## 2023-02-15 MED ORDER — LOSARTAN POTASSIUM 25 MG PO TABS
25.0000 mg | ORAL_TABLET | Freq: Every day | ORAL | Status: DC
  Administered 2023-02-15 – 2023-02-17 (×3): 25 mg via ORAL
  Filled 2023-02-15 (×3): qty 1

## 2023-02-15 MED ORDER — TICAGRELOR 90 MG PO TABS
90.0000 mg | ORAL_TABLET | Freq: Two times a day (BID) | ORAL | 0 refills | Status: DC
  Filled 2023-02-15: qty 60, 30d supply, fill #0

## 2023-02-15 MED ORDER — ASPIRIN 81 MG PO CHEW
81.0000 mg | CHEWABLE_TABLET | Freq: Every day | ORAL | Status: DC
  Administered 2023-02-16 – 2023-02-17 (×2): 81 mg via ORAL
  Filled 2023-02-15 (×2): qty 1

## 2023-02-15 MED ORDER — SODIUM CHLORIDE 0.9% FLUSH: 3.0000 mL | INTRAVENOUS | Status: DC | PRN

## 2023-02-15 MED ORDER — CARVEDILOL 6.25 MG PO TABS
6.2500 mg | ORAL_TABLET | Freq: Once | ORAL | Status: AC
  Administered 2023-02-15: 6.25 mg via ORAL
  Filled 2023-02-15: qty 1

## 2023-02-15 MED ORDER — METOPROLOL TARTRATE 25 MG PO TABS: 12.5000 mg | ORAL_TABLET | Freq: Two times a day (BID) | ORAL | Status: DC

## 2023-02-15 MED ORDER — ASPIRIN 81 MG PO CHEW
324.0000 mg | CHEWABLE_TABLET | ORAL | Status: AC
  Administered 2023-02-15: 324 mg via ORAL
  Filled 2023-02-15: qty 4

## 2023-02-15 MED ORDER — VERAPAMIL HCL 2.5 MG/ML IV SOLN
INTRAVENOUS | Status: DC | PRN
  Administered 2023-02-15: 2.5 mg via INTRAVENOUS

## 2023-02-15 MED ORDER — TICAGRELOR 90 MG PO TABS
90.0000 mg | ORAL_TABLET | Freq: Two times a day (BID) | ORAL | Status: DC
  Administered 2023-02-15 – 2023-02-17 (×4): 90 mg via ORAL
  Filled 2023-02-15 (×4): qty 1

## 2023-02-15 MED ORDER — ATORVASTATIN CALCIUM 80 MG PO TABS
80.0000 mg | ORAL_TABLET | Freq: Every day | ORAL | Status: DC
  Administered 2023-02-15 – 2023-02-17 (×3): 80 mg via ORAL
  Filled 2023-02-15: qty 1
  Filled 2023-02-15 (×2): qty 4

## 2023-02-15 MED ORDER — TICAGRELOR 90 MG PO TABS
ORAL_TABLET | ORAL | Status: AC
  Filled 2023-02-15: qty 2

## 2023-02-15 MED ORDER — ONDANSETRON HCL 4 MG/2ML IJ SOLN: 4.0000 mg | Freq: Four times a day (QID) | INTRAMUSCULAR | Status: DC | PRN

## 2023-02-15 MED ORDER — NITROGLYCERIN 1 MG/10 ML FOR IR/CATH LAB
INTRA_ARTERIAL | Status: AC
  Filled 2023-02-15: qty 10

## 2023-02-15 MED ORDER — ATORVASTATIN CALCIUM 20 MG PO TABS: 80.0000 mg | ORAL_TABLET | Freq: Every day | ORAL | Status: DC

## 2023-02-15 MED ORDER — HEPARIN SODIUM (PORCINE) 1000 UNIT/ML IJ SOLN
INTRAMUSCULAR | Status: DC | PRN
  Administered 2023-02-15 (×2): 4000 [IU] via INTRAVENOUS

## 2023-02-15 MED ORDER — HEPARIN (PORCINE) IN NACL 1000-0.9 UT/500ML-% IV SOLN
INTRAVENOUS | Status: DC | PRN
  Administered 2023-02-15 (×2): 500 mL

## 2023-02-15 MED ORDER — ACETAMINOPHEN 325 MG PO TABS: 650.0000 mg | ORAL_TABLET | ORAL | Status: DC | PRN

## 2023-02-15 MED ORDER — ATORVASTATIN CALCIUM 80 MG PO TABS
80.0000 mg | ORAL_TABLET | Freq: Every day | ORAL | 0 refills | Status: DC
  Filled 2023-02-15: qty 30, 30d supply, fill #0

## 2023-02-15 MED ORDER — ORAL CARE MOUTH RINSE: 15.0000 mL | OROMUCOSAL | Status: DC | PRN

## 2023-02-15 MED ORDER — HEPARIN SODIUM (PORCINE) 1000 UNIT/ML IJ SOLN
INTRAMUSCULAR | Status: AC
  Filled 2023-02-15: qty 10

## 2023-02-15 MED ORDER — SODIUM CHLORIDE 0.9 % IV SOLN: INTRAVENOUS | Status: AC

## 2023-02-15 MED ORDER — SODIUM CHLORIDE 0.9% FLUSH
3.0000 mL | Freq: Two times a day (BID) | INTRAVENOUS | Status: DC
  Administered 2023-02-15 – 2023-02-17 (×5): 3 mL via INTRAVENOUS

## 2023-02-15 MED ORDER — MIDAZOLAM HCL 2 MG/2ML IJ SOLN
INTRAMUSCULAR | Status: DC | PRN
  Administered 2023-02-15: 1 mg via INTRAVENOUS

## 2023-02-15 MED ORDER — ASPIRIN 300 MG RE SUPP
300.0000 mg | RECTAL | Status: AC
  Filled 2023-02-15: qty 1

## 2023-02-15 MED ORDER — ENOXAPARIN SODIUM 40 MG/0.4ML IJ SOSY
40.0000 mg | PREFILLED_SYRINGE | INTRAMUSCULAR | Status: DC
  Administered 2023-02-15: 40 mg via SUBCUTANEOUS
  Filled 2023-02-15: qty 0.4

## 2023-02-15 MED ORDER — FENTANYL CITRATE (PF) 100 MCG/2ML IJ SOLN
INTRAMUSCULAR | Status: AC
  Filled 2023-02-15: qty 2

## 2023-02-15 MED ORDER — ENOXAPARIN SODIUM 40 MG/0.4ML IJ SOSY
40.0000 mg | PREFILLED_SYRINGE | INTRAMUSCULAR | Status: DC
  Administered 2023-02-16 – 2023-02-17 (×2): 40 mg via SUBCUTANEOUS
  Filled 2023-02-15 (×2): qty 0.4

## 2023-02-15 MED ORDER — CHLORHEXIDINE GLUCONATE CLOTH 2 % EX PADS
6.0000 | MEDICATED_PAD | Freq: Every day | CUTANEOUS | Status: DC
  Administered 2023-02-15 – 2023-02-17 (×3): 6 via TOPICAL

## 2023-02-15 MED ORDER — HEPARIN (PORCINE) IN NACL 1000-0.9 UT/500ML-% IV SOLN
INTRAVENOUS | Status: AC
  Filled 2023-02-15: qty 1000

## 2023-02-15 MED ORDER — TICAGRELOR 90 MG PO TABS
ORAL_TABLET | ORAL | Status: DC | PRN
  Administered 2023-02-15: 180 mg via ORAL

## 2023-02-15 MED ORDER — VERAPAMIL HCL 2.5 MG/ML IV SOLN
INTRAVENOUS | Status: AC
  Filled 2023-02-15: qty 2

## 2023-02-15 MED ORDER — SODIUM CHLORIDE 0.9 % IV SOLN: 250.0000 mL | INTRAVENOUS | Status: DC | PRN

## 2023-02-15 MED ORDER — NITROGLYCERIN 0.4 MG SL SUBL: 0.4000 mg | SUBLINGUAL_TABLET | SUBLINGUAL | Status: DC | PRN

## 2023-02-15 MED ORDER — SODIUM CHLORIDE 0.9 % IV SOLN: INTRAVENOUS | Status: DC

## 2023-02-15 MED ORDER — CARVEDILOL 6.25 MG PO TABS
6.2500 mg | ORAL_TABLET | Freq: Two times a day (BID) | ORAL | Status: DC
  Administered 2023-02-15 – 2023-02-17 (×4): 6.25 mg via ORAL
  Filled 2023-02-15 (×4): qty 1

## 2023-02-15 MED ORDER — MIDAZOLAM HCL 2 MG/2ML IJ SOLN
INTRAMUSCULAR | Status: AC
  Filled 2023-02-15: qty 2

## 2023-02-15 MED ORDER — NITROGLYCERIN 1 MG/10 ML FOR IR/CATH LAB
INTRA_ARTERIAL | Status: DC | PRN
  Administered 2023-02-15 (×2): 200 ug

## 2023-02-15 SURGICAL SUPPLY — 21 items
BALLN TREK RX 2.5X12 (BALLOONS) ×1
BALLN ~~LOC~~ TREK NEO RX 3.25X20 (BALLOONS) ×1
BALLOON TREK RX 2.5X12 (BALLOONS) IMPLANT
BALLOON ~~LOC~~ TREK NEO RX 3.25X20 (BALLOONS) IMPLANT
CATH INFINITI JR4 5F (CATHETERS) IMPLANT
CATH LAUNCHER 6FR EBU3.5 (CATHETERS) IMPLANT
DEVICE RAD TR BAND REGULAR (VASCULAR PRODUCTS) IMPLANT
DRAPE BRACHIAL (DRAPES) IMPLANT
GLIDESHEATH SLEND SS 6F .021 (SHEATH) IMPLANT
GUIDEWIRE INQWIRE 1.5J.035X260 (WIRE) IMPLANT
INQWIRE 1.5J .035X260CM (WIRE) ×1
KIT ENCORE 26 ADVANTAGE (KITS) IMPLANT
KIT SYRINGE INJ CVI SPIKEX1 (MISCELLANEOUS) IMPLANT
PACK CARDIAC CATH (CUSTOM PROCEDURE TRAY) ×1 IMPLANT
PAD ELECT DEFIB RADIOL ZOLL (MISCELLANEOUS) IMPLANT
PROTECTION STATION PRESSURIZED (MISCELLANEOUS) ×1
SET ATX-X65L (MISCELLANEOUS) IMPLANT
STATION PROTECTION PRESSURIZED (MISCELLANEOUS) IMPLANT
STENT ONYX FRONTIER 3.0X34 (Permanent Stent) IMPLANT
TUBING CIL FLEX 10 FLL-RA (TUBING) IMPLANT
WIRE RUNTHROUGH .014X180CM (WIRE) IMPLANT

## 2023-02-15 NOTE — Progress Notes (Signed)
   02/15/23 0700  Spiritual Encounters  Type of Visit Initial  Care provided to: Family  Referral source Code page  Reason for visit Code  OnCall Visit Yes  Spiritual Framework  Presenting Themes Community and relationships;Caregiving needs  Family Stress Factors Major life changes  Interventions  Spiritual Care Interventions Made Established relationship of care and support;Compassionate presence;Reflective listening;Encouragement  Intervention Outcomes  Outcomes Connection to spiritual care  Spiritual Care Plan  Spiritual Care Issues Still Outstanding Referring to oncoming chaplain for further support   Spoke with patient family while patient was in [facility]. Patient husband is experiencing sadness and worry about his wife. 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 PT daughter and son in law was also present and other family is on the way. Advise them that we are here for them and we will be checking on them and patient.

## 2023-02-15 NOTE — ED Provider Notes (Signed)
[facility] Provider Note    Event Date/Time   First MD Initiated Contact with Patient 02/15/23 [phone]     (approximate)   History   Chest Pain   HPI  Karina Freeman is a 72 y.o. female  here with chest pain. Pt arrives as a CODE STEMI. Reports she developed aching, severe chest pain and pressure overnight that has persisted. She now has some SOB with it. EMS arrived and noted ST elevations on EKG. She took ASA 325 at home and has been given nitroglycerin with minimal relief. No known h/o coronary disease but does report h/o stroke.       Physical Exam   Triage Vital Signs: ED Triage Vitals [02/15/23 0654]  Encounter Vitals Group     BP (!) 182/101     Systolic BP Percentile      Diastolic BP Percentile      Pulse Rate 94     Resp 18     Temp      Temp src      SpO2 98 %     Weight      Height      Head Circumference      Peak Flow      Pain Score      Pain Loc      Pain Education      Exclude from Growth Chart     Most recent vital signs: Vitals:   02/15/23 0706 02/15/23 0833  BP:  (!) 166/75  Pulse:  75  Resp:  14  SpO2: 98% 98%     General: Awake, no distress.  CV:  Good peripheral perfusion.  Resp:  Normal work of breathing.  Abd:  No distention.  Other:  Anxious   ED Results / Procedures / Treatments   Labs (all labs ordered are listed, but only abnormal results are displayed) Labs Reviewed  CBC WITH DIFFERENTIAL/PLATELET - Abnormal; Notable for the following components:      Result Value   Lymphs Abs 4.3 (*)    All other components within normal limits  COMPREHENSIVE METABOLIC PANEL - Abnormal; Notable for the following components:   Potassium 3.1 (*)    CO2 20 (*)    Glucose, Bld 115 (*)    All other components within normal limits  LIPID PANEL - Abnormal; Notable for the following components:   Cholesterol 241 (*)    LDL Cholesterol 164 (*)    All other components within normal limits  TROPONIN I (HIGH  SENSITIVITY) - Abnormal; Notable for the following components:   Troponin I (High Sensitivity) 201 (*)    All other components within normal limits  MRSA NEXT GEN BY PCR, NASAL  GLUCOSE, CAPILLARY  HEMOGLOBIN A1C  PROTIME-INR  APTT  POCT ACTIVATED CLOTTING TIME  I-STAT CG4 LACTIC ACID, ED  TROPONIN I (HIGH SENSITIVITY)     EKG Pre-arrival EKG reviewed, shows NSR with ST elevations in anterior leads and significant depressions in inferior leads.   RADIOLOGY    I also independently reviewed and agree with radiologist interpretations.   PROCEDURES:  Critical Care performed: Yes, see critical care procedure note(s)  .Critical Care  Performed by: Shaune Pollack, MD Authorized by: Shaune Pollack, MD   Critical care provider statement:    Critical care time (minutes):  30   Critical care time was exclusive of:  Separately billable procedures and treating other patients   Critical care was necessary to treat or prevent imminent or  life-threatening deterioration of the following conditions:  Cardiac failure, circulatory failure and respiratory failure   Critical care was time spent personally by me on the following activities:  Development of treatment plan with patient or surrogate, discussions with consultants, evaluation of patient's response to treatment, examination of patient, ordering and review of laboratory studies, ordering and review of radiographic studies, ordering and performing treatments and interventions, pulse oximetry, re-evaluation of patient's condition and review of old charts     MEDICATIONS ORDERED IN ED: Medications  0.9 %  sodium chloride infusion ( Intravenous Canceled Entry 02/15/23 0856)  aspirin chewable tablet 81 mg (has no administration in time range)  ticagrelor (BRILINTA) tablet 90 mg (has no administration in time range)  aspirin chewable tablet 324 mg (has no administration in time range)    Or  aspirin suppository 300 mg (has no  administration in time range)  nitroGLYCERIN (NITROSTAT) SL tablet 0.4 mg (has no administration in time range)  acetaminophen (TYLENOL) tablet 650 mg (has no administration in time range)  ondansetron (ZOFRAN) injection 4 mg (has no administration in time range)  enoxaparin (LOVENOX) injection 40 mg (has no administration in time range)     IMPRESSION / MDM / ASSESSMENT AND PLAN / ED COURSE  I reviewed the triage vital signs and the nursing notes.                              Differential diagnosis includes, but is not limited to, ACS/STEMI, NSTEMI, HTN urgency, gastritis, GERD, dissection, PE, PNA  Patient's presentation is most consistent with acute presentation with potential threat to life or bodily function.  The patient is on the cardiac monitor to evaluate for evidence of arrhythmia and/or significant heart rate changes  72 yo F here with severe chest pain. EKG was sent pre-arrival and reviewed by me, activated as a CODE STEMI with coordination with the cath lab. On arrival, Dr. Kirke Corin of Cardiology present. Decision made to take urgently to cath labs. Labs pending. Pt is HDS, protecting airway. Pain is not concerning for dissection or PE clinically.     FINAL CLINICAL IMPRESSION(S) / ED DIAGNOSES   Final diagnoses:  ST elevation myocardial infarction (STEMI), unspecified artery (HCC)     Rx / DC Orders   ED Discharge Orders          Ordered    AMB Referral to Cardiac Rehabilitation - Phase II        02/15/23 0751             Note:  This document was prepared using Dragon voice recognition software and may include unintentional dictation errors.   Shaune Pollack, MD 02/15/23 [phone]

## 2023-02-15 NOTE — Assessment & Plan Note (Signed)
Titrate BP regimen per cardiology recommendations

## 2023-02-15 NOTE — Consult Note (Signed)
Cardiology Consultation   Patient ID: Karina Freeman MRN: [id number]; DOB: 1950/11/12  Admit date: 02/15/2023 Date of Consult: 02/15/2023  PCP:  Jaclyn Shaggy, MD   [facility] Providers Cardiologist:  None   new Kirke Corin)   Patient Profile:   Karina Freeman is a 72 y.o. female with a hx of prior CVA, essential hypertension, hyperlipidemia, GERD and chronic kidney disease who is being seen 02/15/2023 for the evaluation of anterior STEMI at the request of Dr. Erma Heritage.  History of Present Illness:   Karina Freeman is a 72 year old female with no prior cardiac history.  She has not seen a primary care physician in 2 years and has not taken medications.  She started having substernal chest pain and tightness at 11 PM that was initially mild but continued to be intermittent and worsened this morning.  The pain is substernal without radiation.  It is associated with shortness of breath.  Due to persistent chest pain, she called EMS.  An EKG was performed which showed anterior ST elevation with reciprocal ST depression in the inferior lead.  A code STEMI was activated by EMS.  By the time she arrived to the ED, she was still having 5 out of 10 chest pain and appeared uncomfortable.  Given her symptoms and EKG changes, I recommended proceeding with emergent cardiac catheterization and possible PCI.  I also discussed with her family who were present.  The patient was taken directly to the Cath Lab.   Past Medical History:  Diagnosis Date   Abnormal Pap smear of cervix 11/11/2015   Anxiety    CVA (cerebral infarction)    Depressive disorder    GERD (gastroesophageal reflux disease)    Hyperlipidemia    Hypertension    IFG (impaired fasting glucose)    Osteoporosis     Past Surgical History:  Procedure Laterality Date   APPENDECTOMY     TONSILLECTOMY     TUBAL LIGATION       Home Medications:  Prior to Admission medications   Medication Sig Start Date End Date Taking?  Authorizing Provider  clopidogrel (PLAVIX) 75 MG tablet Take 1 tablet (75 mg total) by mouth daily. Patient not taking: Reported on 02/15/2023 06/25/16   Gabriel Cirri, NP    Inpatient Medications: Scheduled Meds:  aspirin  324 mg Oral NOW   Or   aspirin  300 mg Rectal NOW   [START ON 02/16/2023] aspirin  81 mg Oral Daily   atorvastatin  80 mg Oral Daily   enoxaparin (LOVENOX) injection  40 mg Subcutaneous Q24H   metoprolol tartrate  12.5 mg Oral BID   ticagrelor  90 mg Oral BID   Continuous Infusions:  sodium chloride     PRN Meds: acetaminophen, nitroGLYCERIN, ondansetron (ZOFRAN) IV  Allergies:    Allergies  Allergen Reactions   Citalopram Hydrobromide Nausea And Vomiting   Codeine Diarrhea and Nausea And Vomiting   Penicillin G Benzathine     Social History:   Social History   Socioeconomic History   Marital status: Divorced    Spouse name: Not on file   Number of children: Not on file   Years of education: Not on file   Highest education level: Not on file  Occupational History   Not on file  Tobacco Use   Smoking status: Former    Current packs/day: 0.00    Types: Cigarettes    Quit date: 07/16/1973    Years since quitting: 81.6  Smokeless tobacco: Never  Substance and Sexual Activity   Alcohol use: No    Alcohol/week: 0.0 standard drinks of alcohol   Drug use: No   Sexual activity: Yes  Other Topics Concern   Not on file  Social History Narrative   Not on file   Social Determinants of Health   Financial Resource Strain: Not on file  Food Insecurity: Not on file  Transportation Needs: Not on file  Physical Activity: Not on file  Stress: Not on file  Social Connections: Not on file  Intimate Partner Violence: Not on file    Family History:    Family History  Problem Relation Age of Onset   Heart disease Mother    Cancer Mother        breast   Heart disease Father    Heart disease Brother    Bipolar disorder Brother    Heart disease Sister     Breast cancer Neg Hx      ROS:  Please see the history of present illness.   All other ROS reviewed and negative.     Physical Exam/Data:   Vitals:   02/15/23 0654 02/15/23 0706 02/15/23 0833 02/15/23 0900  BP: (!) 182/101  (!) 166/75   Pulse: 94  75   Resp: 18  14   Temp:    98.3 F (36.8 C)  TempSrc:    Oral  SpO2: 98% 98% 98%    No intake or output data in the 24 hours ending 02/15/23 0933    06/25/2016    3:51 PM 11/11/2015   10:06 AM 04/26/2015   10:37 AM  Last 3 Weights  Weight (lbs) 181 lb 6.4 oz 174 lb 9.6 oz 177 lb 9.6 oz  Weight (kg) 82.283 kg 79.198 kg 80.559 kg     There is no height or weight on file to calculate BMI.  General:  Well nourished, well developed, in no acute distress HEENT: normal Neck: no JVD Vascular: No carotid bruits; Distal pulses 2+ bilaterally Cardiac:  normal S1, S2; RRR; no murmur  Lungs:  clear to auscultation bilaterally, no wheezing, rhonchi or rales  Abd: soft, nontender, no hepatomegaly  Ext: no edema Musculoskeletal:  No deformities, BUE and BLE strength normal and equal Skin: warm and dry  Neuro:  CNs 2-12 intact, no focal abnormalities noted Psych:  Normal affect   EKG:  The EKG was personally reviewed and demonstrates: Sinus rhythm with minor anterior ST elevation with reciprocal ST depression in the inferior leads Telemetry:  Telemetry was personally reviewed and demonstrates: PVCs noted during cardiac  Relevant CV Studies:   Laboratory Data:  High Sensitivity Troponin:   Recent Labs  Lab 02/15/23 0700  TROPONINIHS 201*     Chemistry Recent Labs  Lab 02/15/23 0700  NA 138  K 3.1*  CL 104  CO2 20*  GLUCOSE 115*  BUN 15  CREATININE 0.91  CALCIUM 8.9  GFRNONAA >60  ANIONGAP 14    Recent Labs  Lab 02/15/23 0700  PROT 7.8  ALBUMIN 4.0  AST 21  ALT 12  ALKPHOS 59  BILITOT 0.7   Lipids  Recent Labs  Lab 02/15/23 0700  CHOL 241*  TRIG 84  HDL 60  LDLCALC 164*  CHOLHDL 4.0     Hematology Recent Labs  Lab 02/15/23 0700  WBC 10.0  RBC 4.37  HGB 13.4  HCT 39.1  MCV 89.5  MCH 30.7  MCHC 34.3  RDW 11.9  PLT 282  Thyroid No results for input(s): "TSH", "FREET4" in the last 168 hours.  BNPNo results for input(s): "BNP", "PROBNP" in the last 168 hours.  DDimer No results for input(s): "DDIMER" in the last 168 hours.   Radiology/Studies:  CARDIAC CATHETERIZATION  Result Date: 02/15/2023   Prox RCA to Mid RCA lesion is 80% stenosed.   Ost Cx to Prox Cx lesion is 30% stenosed.   LPAV lesion is 50% stenosed.   Mid LAD-1 lesion is 99% stenosed.   Mid LAD-2 lesion is 70% stenosed.   2nd Diag lesion is 40% stenosed.   Dist LAD lesion is 50% stenosed.   Mid LAD-3 lesion is 30% stenosed.   A drug-eluting stent was successfully placed using a STENT ONYX FRONTIER 3.0X34.   Post intervention, there is a 0% residual stenosis.   Post intervention, there is a 0% residual stenosis.   There is moderate left ventricular systolic dysfunction.   LV end diastolic pressure is normal.   The left ventricular ejection fraction is 35-45% by visual estimate.   As long as the patient continues to meet low risk STEMI criteria and in the absence of any other complications or medical issues, we expect the patient to be ready for discharge on 02/16/2023.   Recommend uninterrupted dual antiplatelet therapy with Aspirin 81mg  daily and Ticagrelor 90mg  twice daily for a minimum of 12 months (ACS-Class I recommendation). 1.  Significant two-vessel coronary artery disease.  The culprit for anterior STEMI is 99% stenosis in the mid LAD.  There is also 80% stenosis in the proximal/mid right coronary artery. 2.  Moderately reduced LV systolic function with normal left ventricular end-diastolic pressure. 3.  Successful angioplasty and drug-eluting stent placement to the mid LAD.  A long stent was used to cover the second lesion shortly after second diagonal which was jailed by the stent but had normal TIMI-3 flow.  Recommendations: Dual antiplatelet therapy for 12 months. Aggressive treatment of risk factors. Staged RCA PCI is recommended in the outpatient in few weeks. The patient is a candidate for accelerated discharge in 24-36 hours if her EF is greater than 35% by echo.     Assessment and Plan:   Anterior ST elevation myocardial infarction: Emergent cardiac catheterization was done via the right radial artery which showed a 90% stenosis in the mid LAD which was the culprit.  This was treated successfully with PCI and drug-eluting stent placement.  In addition, there was 80% stenosis in the proximal/mid right coronary artery.  Recommend staged PCI of this as an outpatient in few weeks once she recovers.  Recommend dual antiplatelet therapy without interruption for 12 months. 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  I started carvedilol.  Recommend initiating treatment with an ARB/MRA if blood pressure tolerates and renal function is stable.  I requested an echocardiogram.  No clear benefit of SGLT2 inhibitor in post MI cardiomyopathy. Essential hypertension: She has not taken lisinopril in few years. Hyperlipidemia: I started high-dose atorvastatin. Previous CVA: Used to be on clopidogrel but now she is going to be on ticagrelor. Disposition: If her ejection fraction is greater than 35% and she remains stable, she is a candidate for discharge tomorrow.   Risk Assessment/Risk Scores:     TIMI Risk Score for ST  Elevation MI:   The patient's TIMI risk score is 7, which indicates a 23.4% risk of all cause mortality at 30 days.{  For questions or updates, please contact [facility] Please consult [email or web address] for contact info under    Signed, Lorine Bears, MD  02/15/2023 9:33 AM

## 2023-02-15 NOTE — Assessment & Plan Note (Signed)
Patient reports remote history of CVA in 2008 and 2012 Has not been on medication

## 2023-02-15 NOTE — Assessment & Plan Note (Addendum)
Acute severe chest pain overnight with noted STEMI on EKG Cardiac catheterization performed with noted occlusion of the mid LAD status post stent placement with Dr. Kirke Corin Will continue cardiac monitoring overnight ASA and Brilinta Secondary prevention medication Follow-up formal cardiology medication recommendations Follow

## 2023-02-15 NOTE — Progress Notes (Signed)
*  PRELIMINARY RESULTS* Echocardiogram 2D Echocardiogram has been performed.  Cristela Blue 02/15/2023, 2:41 PM

## 2023-02-15 NOTE — H&P (Addendum)
History and Physical    Patient: Karina Freeman:[id number] DOB: 08-14-1950 DOA: 02/15/2023 DOS: the patient was seen and examined on 02/15/2023 PCP: Jaclyn Shaggy, MD  Patient coming from: Home  Chief Complaint:  Chief Complaint  Patient presents with   Chest Pain   HPI: Karina Freeman is a 72 y.o. female with medical history significant of CVA, depression, GERD, hyperlipidemia, hypertension presenting with STEMI.  Patient reports sudden onset of chest pain overnight.  Chest pain moderate to severe in intensity.  Unspecified radiation.  Positive diaphoresis.  Presented to the ER overnight secondary to chest pain.  Noted ST elevations in anterior leads with patient being sent to the Cath Lab for code STEMI.    Patient does report remote history of CVA in 2008 as well as 2012.  Patient states she no longer takes any antiplatelet, statin therapy.  Has been off medication for several years.  No reported alcohol or tobacco use. Presented to the ER afebrile, blood pressures 160s to 180s over 70s to 100s.Troponin of 201 on presentation.  White count 10, hemoglobin 13.4.  Lipid panel with LDL of 164 and cholesterol of 241. Review of Systems: As mentioned in the history of present illness. All other systems reviewed and are negative. Past Medical History:  Diagnosis Date   Abnormal Pap smear of cervix 11/11/2015   Anxiety    CVA (cerebral infarction)    Depressive disorder    GERD (gastroesophageal reflux disease)    Hyperlipidemia    Hypertension    IFG (impaired fasting glucose)    Osteoporosis    Past Surgical History:  Procedure Laterality Date   APPENDECTOMY     TONSILLECTOMY     TUBAL LIGATION     Social History:  reports that she quit smoking about 49 years ago. She has never used smokeless tobacco. She reports that she does not drink alcohol and does not use drugs.  Allergies  Allergen Reactions   Citalopram Hydrobromide Nausea And Vomiting   Codeine Diarrhea and Nausea And  Vomiting   Penicillin G Benzathine     Family History  Problem Relation Age of Onset   Heart disease Mother    Cancer Mother        breast   Heart disease Father    Heart disease Brother    Bipolar disorder Brother    Heart disease Sister    Breast cancer Neg Hx     Prior to Admission medications   Medication Sig Start Date End Date Taking? Authorizing Provider  clopidogrel (PLAVIX) 75 MG tablet Take 1 tablet (75 mg total) by mouth daily. Patient not taking: Reported on 02/15/2023 06/25/16   Gabriel Cirri, NP    Physical Exam: Vitals:   02/15/23 0654 02/15/23 0706 02/15/23 0833  BP: (!) 182/101  (!) 166/75  Pulse: 94  75  Resp: 18  14  SpO2: 98% 98% 98%   Physical Exam HENT:     Head: Normocephalic and atraumatic.     Nose: Nose normal.     Mouth/Throat:     Mouth: Mucous membranes are moist.  Eyes:     Pupils: Pupils are equal, round, and reactive to light.  Cardiovascular:     Rate and Rhythm: Normal rate and regular rhythm.  Pulmonary:     Effort: Pulmonary effort is normal.  Abdominal:     General: Bowel sounds are normal.  Musculoskeletal:        General: Normal range of motion.  Skin:  General: Skin is warm.  Neurological:     General: No focal deficit present.     Mental Status: She is alert.  Psychiatric:        Mood and Affect: Mood normal.     Data Reviewed:  There are no new results to review at this time. CARDIAC CATHETERIZATION   Prox RCA to Mid RCA lesion is 80% stenosed.   Ost Cx to Prox Cx lesion is 30% stenosed.   LPAV lesion is 50% stenosed.   Mid LAD-1 lesion is 99% stenosed.   Mid LAD-2 lesion is 70% stenosed.   2nd Diag lesion is 40% stenosed.   Dist LAD lesion is 50% stenosed.   Mid LAD-3 lesion is 30% stenosed.   A drug-eluting stent was successfully placed using a STENT ONYX  FRONTIER 3.0X34.   Post intervention, there is a 0% residual stenosis.   Post intervention, there is a 0% residual stenosis.   There is moderate  left ventricular systolic dysfunction.   LV end diastolic pressure is normal.   The left ventricular ejection fraction is 35-45% by visual estimate.   As long as the patient continues to meet low risk STEMI criteria and in  the absence of any other complications or medical issues, we expect the  patient to be ready for discharge on 02/16/2023.   Recommend uninterrupted dual antiplatelet therapy with Aspirin 81mg   daily and Ticagrelor 90mg  twice daily for a minimum of 12 months  (ACS-Class I recommendation).  1.  Significant two-vessel coronary artery disease.  The culprit for  anterior STEMI is 99% stenosis in the mid LAD.  There is also 80% stenosis  in the proximal/mid right coronary artery. 2.  Moderately reduced LV systolic function with normal left ventricular  end-diastolic pressure. 3.  Successful angioplasty and drug-eluting stent placement to the mid  LAD.  A long stent was used to cover the second lesion shortly after  second diagonal which was jailed by the stent but had normal TIMI-3 flow.  Recommendations: Dual antiplatelet therapy for 12 months. Aggressive treatment of risk factors. Staged RCA PCI is recommended in the outpatient in few weeks. The patient is a candidate for accelerated discharge in 24-36 hours if her  EF is greater than 35% by echo.  Lab Results  Component Value Date   WBC 10.0 02/15/2023   HGB 13.4 02/15/2023   HCT 39.1 02/15/2023   MCV 89.5 02/15/2023   PLT 282 02/15/2023   Last metabolic panel Lab Results  Component Value Date   GLUCOSE 115 (H) 02/15/2023   NA 138 02/15/2023   K 3.1 (L) 02/15/2023   CL 104 02/15/2023   CO2 20 (L) 02/15/2023   BUN 15 02/15/2023   CREATININE 0.91 02/15/2023   GFRNONAA >60 02/15/2023   CALCIUM 8.9 02/15/2023   PROT 7.8 02/15/2023   ALBUMIN 4.0 02/15/2023   LABGLOB 3.1 06/25/2016   AGRATIO 1.5 06/25/2016   BILITOT 0.7 02/15/2023   ALKPHOS 59 02/15/2023   AST 21 02/15/2023   ALT 12 02/15/2023   ANIONGAP  14 02/15/2023    Assessment and Plan: * Acute ST elevation myocardial infarction (STEMI) of anterior wall (HCC) Acute severe chest pain overnight with noted STEMI on EKG Cardiac catheterization performed with noted occlusion of the mid LAD status post stent placement with Dr. Kirke Corin Will continue cardiac monitoring overnight ASA and Brilinta Secondary prevention medication Follow-up formal cardiology medication recommendations Follow  Chronic kidney disease, stage 3 (HCC) Cr 0.9 w/ GFR >60  Hypertension Titrate BP regimen per cardiology recommendations    GERD (gastroesophageal reflux disease) PPI   Hyperlipidemia High dose statin    Cerebral infarction [facility]) Patient reports remote history of CVA in 2008 and 2012 Has not been on medication       Advance Care Planning:   Code Status: Full Code   Consults: Dr. Kirke Corin w/ cardiology   Family Communication: No family at the bedside   Severity of Illness: The appropriate patient status for this patient is OBSERVATION. Observation status is judged to be reasonable and necessary in order to provide the required intensity of service to ensure the patient's safety. The patient's presenting symptoms, physical exam findings, and initial radiographic and laboratory data in the context of their medical condition is felt to place them at decreased risk for further clinical deterioration. Furthermore, it is anticipated that the patient will be medically stable for discharge from the hospital within 2 midnights of admission.   Author: Floydene Flock, MD 02/15/2023 9:08 AM  For on call review [email or web address].

## 2023-02-15 NOTE — Assessment & Plan Note (Signed)
Cr 0.9 w/ GFR >60

## 2023-02-15 NOTE — Progress Notes (Signed)
PHARMACY CONSULT NOTE  Pharmacy Consult for Electrolyte Monitoring and Replacement   Recent Labs: Potassium (mmol/L)  Date Value  02/15/2023 3.1 (L)   Calcium (mg/dL)  Date Value  29/56/2130 8.9   Albumin (g/dL)  Date Value  86/57/8469 4.0  06/25/2016 4.5   Sodium (mmol/L)  Date Value  02/15/2023 138  06/25/2016 140     Assessment: 72 y.o. female with a hx of prior CVA, essential hypertension, hyperlipidemia, GERD and chronic kidney disease who is being evaluated for anterior STEMI   Goal of Therapy:  Electrolytes WNL  Plan:  ---40 mEq po Kcl x 1 ---recheck electrolytes in am  Lowella Bandy ,PharmD Clinical Pharmacist 02/15/2023 12:32 PM

## 2023-02-15 NOTE — Assessment & Plan Note (Signed)
High-dose statin ?

## 2023-02-15 NOTE — Assessment & Plan Note (Signed)
PPI ?

## 2023-02-15 NOTE — ED Triage Notes (Signed)
Pt brought in by EMS at this time for chest pain which started this morning. Leads showed ST elevation, upon patient arrival, cardiology was at bedside not taken off of stretcher, directed to cath lab immediately

## 2023-02-16 DIAGNOSIS — E782 Mixed hyperlipidemia: Secondary | ICD-10-CM

## 2023-02-16 DIAGNOSIS — I255 Ischemic cardiomyopathy: Secondary | ICD-10-CM

## 2023-02-16 DIAGNOSIS — K219 Gastro-esophageal reflux disease without esophagitis: Secondary | ICD-10-CM | POA: Diagnosis not present

## 2023-02-16 DIAGNOSIS — I2109 ST elevation (STEMI) myocardial infarction involving other coronary artery of anterior wall: Secondary | ICD-10-CM | POA: Diagnosis not present

## 2023-02-16 DIAGNOSIS — I639 Cerebral infarction, unspecified: Secondary | ICD-10-CM | POA: Diagnosis not present

## 2023-02-16 LAB — TROPONIN I (HIGH SENSITIVITY): Troponin I (High Sensitivity): 1092 ng/L (ref <18)

## 2023-02-16 MED ORDER — POTASSIUM CHLORIDE CRYS ER 20 MEQ PO TBCR
20.0000 meq | EXTENDED_RELEASE_TABLET | Freq: Once | ORAL | Status: AC
  Administered 2023-02-16: 20 meq via ORAL
  Filled 2023-02-16: qty 1

## 2023-02-16 NOTE — Progress Notes (Signed)
PHARMACY CONSULT NOTE  Pharmacy Consult for Electrolyte Monitoring and Replacement   Recent Labs: Potassium (mmol/L)  Date Value  02/16/2023 3.6   Magnesium (mg/dL)  Date Value  16/04/9603 2.0   Calcium (mg/dL)  Date Value  54/03/8118 8.7 (L)   Albumin (g/dL)  Date Value  14/78/2956 4.0  06/25/2016 4.5   Sodium (mmol/L)  Date Value  02/16/2023 141  06/25/2016 140     Assessment: 72 y.o. female with a hx of prior CVA, essential hypertension, hyperlipidemia, GERD and chronic kidney disease who is being evaluated for anterior STEMI   Goal of Therapy:  Electrolytes WNL  Plan:  ---20 mEq po Kcl x 1 ---recheck electrolytes in am   Rodriguez-Guzman PharmD, BCPS 02/16/2023 7:19 AM

## 2023-02-16 NOTE — Progress Notes (Signed)
Patient alert and oriented, on room air. No complaints of pain. Right radial pulse plus 2, no bleeding or hematoma. Tolerating diet. Ambulated around unit with walker. Patient being tx to floor, report given to [facility].

## 2023-02-16 NOTE — Progress Notes (Signed)
1       [place] [facility]   PATIENT NAME: Karina Freeman    MR#:  [id number]  PCP: Jaclyn Shaggy, MD  DATE OF BIRTH:  1951-03-11  SUBJECTIVE:  CHIEF COMPLAINT:   Chief Complaint  Patient presents with   Chest Pain  Patient denies any further chest pain.  Feeling much better.  Asking if she will be going home today or not.  Husband at bedside  REVIEW OF SYSTEMS:  Review of Systems  All other systems reviewed and are negative.  DRUG ALLERGIES:   Allergies  Allergen Reactions   Citalopram Hydrobromide Nausea And Vomiting   Codeine Diarrhea and Nausea And Vomiting   Penicillin G Benzathine    VITALS:  Blood pressure 135/78, pulse 72, temperature 97.9 F (36.6 C), resp. rate 17, height 5' 7.5" (1.715 m), weight 74.1 kg, SpO2 95%. PHYSICAL EXAMINATION:  Physical Exam Constitutional:      Appearance: She is well-developed.  HENT:     Head: Normocephalic and atraumatic.  Eyes:     Extraocular Movements: Extraocular movements intact.     Pupils: Pupils are equal, round, and reactive to light.  Cardiovascular:     Rate and Rhythm: Normal rate and regular rhythm.     Heart sounds: Normal heart sounds.  Pulmonary:     Effort: Pulmonary effort is normal.  Abdominal:     General: Bowel sounds are normal.     Palpations: Abdomen is soft.  Musculoskeletal:        General: Normal range of motion.     Cervical back: Normal range of motion and neck supple.  Neurological:     General: No focal deficit present.     Mental Status: She is alert and oriented to person, place, and time.  Psychiatric:        Mood and Affect: Mood normal.        Behavior: Behavior normal.    LABORATORY PANEL:  Female CBC Recent Labs  Lab 02/16/23 0351  WBC 9.3  HGB 13.1  HCT 40.2  PLT 278   ------------------------------------------------------------------------------------------------------------------ Chemistries  Recent Labs  Lab 02/15/23 0700 02/16/23 0351  NA 138  141  K 3.1* 3.6  CL 104 106  CO2 20* 25  GLUCOSE 115* 95  BUN 15 13  CREATININE 0.91 0.87  CALCIUM 8.9 8.7*  MG  --  2.0  AST 21  --   ALT 12  --   ALKPHOS 59  --   BILITOT 0.7  --    MEDICATIONS:  Scheduled Meds:  aspirin  81 mg Oral Daily   atorvastatin  80 mg Oral Daily   carvedilol  6.25 mg Oral BID WC   Chlorhexidine Gluconate Cloth  6 each Topical Daily   enoxaparin (LOVENOX) injection  40 mg Subcutaneous Q24H   losartan  25 mg Oral Daily   sodium chloride flush  3 mL Intravenous Q12H   ticagrelor  90 mg Oral BID   Continuous Infusions:  sodium chloride     sodium chloride     RADIOLOGY:  ECHOCARDIOGRAM COMPLETE  Result Date: 02/15/2023    ECHOCARDIOGRAM REPORT   Patient Name:   Karina Freeman Date of Exam: 02/15/2023 Medical Rec #:  [id number]       Height:       67.5 in Accession #:    [id number]      Weight:       163.4 lb Date of Birth:  [birth year]  BSA:          1.866 m Patient Age:    72 years        BP:           158/88 mmHg Patient Gender: F               HR:           69 bpm. Exam Location:  [facility] Procedure: 2D Echo, Cardiac Doppler and Color Doppler Indications:     Acute myocardial infarction---unspecified I21.9  History:         Patient has no prior history of Echocardiogram examinations.                  Risk Factors:Hypertension and Dyslipidemia.  Sonographer:     Cristela Blue Referring Phys:  [id number] RUEAVWUJ A ARIDA Diagnosing Phys: Debbe Odea MD IMPRESSIONS  1. Left ventricular ejection fraction, by estimation, is 30 to 35%. Left ventricular ejection fraction by 2D MOD biplane is 33.8 %. The left ventricle has moderate to severely decreased function. The left ventricle demonstrates regional wall motion abnormalities (see scoring diagram/findings for description). There is mild left ventricular hypertrophy. Left ventricular diastolic parameters are consistent with Grade I diastolic dysfunction (impaired relaxation). There is akinesis of the left ventricular,  entire anteroseptal wall. There is akinesis of the left ventricular, mid-apical anterior segment.  2. Right ventricular systolic function is normal. The right ventricular size is normal.  3. The mitral valve is normal in structure. Mild mitral valve regurgitation.  4. The aortic valve is calcified. Aortic valve regurgitation is not visualized. FINDINGS  Left Ventricle: Left ventricular ejection fraction, by estimation, is 30 to 35%. Left ventricular ejection fraction by 2D MOD biplane is 33.8 %. The left ventricle has moderate to severely decreased function. The left ventricle demonstrates regional wall motion abnormalities. The left ventricular internal cavity size was normal in size. There is mild left ventricular hypertrophy. Left ventricular diastolic parameters are consistent with Grade I diastolic dysfunction (impaired relaxation). Right Ventricle: The right ventricular size is normal. No increase in right ventricular wall thickness. Right ventricular systolic function is normal. Left Atrium: Left atrial size was normal in size. Right Atrium: Right atrial size was normal in size. Pericardium: There is no evidence of pericardial effusion. Mitral Valve: The mitral valve is normal in structure. Mild mitral valve regurgitation. Tricuspid Valve: The tricuspid valve is normal in structure. Tricuspid valve regurgitation is mild. Aortic Valve: The aortic valve is calcified. Aortic valve regurgitation is not visualized. Aortic valve mean gradient measures 2.0 mmHg. Aortic valve peak gradient measures 3.0 mmHg. Aortic valve area, by VTI measures 3.56 cm. Pulmonic Valve: The pulmonic valve was not well visualized. Pulmonic valve regurgitation is not visualized. Aorta: The aortic root is normal in size and structure. Venous: The inferior vena cava was not well visualized. IAS/Shunts: No atrial level shunt detected by color flow Doppler.  LEFT VENTRICLE PLAX 2D                        Biplane EF (MOD) LVIDd:         4.00  cm         LV Biplane EF:   Left LVIDs:         2.00 cm                          ventricular LV PW:  1.10 cm                          ejection LV IVS:        1.10 cm                          fraction by LVOT diam:     2.00 cm                          2D MOD LV SV:         50                               biplane is LV SV Index:   27                               33.8 %. LVOT Area:     3.14 cm                                Diastology                                LV e' medial:    5.55 cm/s LV Volumes (MOD)               LV E/e' medial:  10.2 LV vol d, MOD    71.8 ml       LV e' lateral:   5.55 cm/s A2C:                           LV E/e' lateral: 10.2 LV vol d, MOD    69.6 ml A4C: LV vol s, MOD    63.3 ml A2C: LV vol s, MOD    35.2 ml A4C: LV SV MOD A2C:   8.5 ml LV SV MOD A4C:   69.6 ml LV SV MOD BP:    23.8 ml RIGHT VENTRICLE RV Basal diam:  2.60 cm RV Mid diam:    2.20 cm RV S prime:     9.90 cm/s TAPSE (M-mode): 1.7 cm LEFT ATRIUM             Index        RIGHT ATRIUM          Index LA diam:        3.60 cm 1.93 cm/m   RA Area:     8.17 cm LA Vol (A2C):   30.3 ml 16.24 ml/m  RA Volume:   14.00 ml 7.50 ml/m LA Vol (A4C):   23.7 ml 12.70 ml/m LA Biplane Vol: 26.6 ml 14.26 ml/m  AORTIC VALVE AV Area (Vmax):    3.16 cm AV Area (Vmean):   3.33 cm AV Area (VTI):     3.56 cm AV Vmax:           86.50 cm/s AV Vmean:          60.300 cm/s AV VTI:            0.141 m AV Peak Grad:      3.0 mmHg AV Mean Grad:      2.0 mmHg  LVOT Vmax:         87.00 cm/s LVOT Vmean:        64.000 cm/s LVOT VTI:          0.160 m LVOT/AV VTI ratio: 1.13  AORTA Ao Root diam: 3.00 cm MITRAL VALVE               TRICUSPID VALVE MV Area (PHT): 1.92 cm    TR Peak grad:   15.8 mmHg MV Decel Time: 396 msec    TR Vmax:        199.00 cm/s MV E velocity: 56.60 cm/s MV A velocity: 75.50 cm/s  SHUNTS MV E/A ratio:  0.75        Systemic VTI:  0.16 m                            Systemic Diam: 2.00 cm Debbe Odea MD Electronically signed by  Debbe Odea MD Signature Date/Time: 02/15/2023/4:34:39 PM    Final    ASSESSMENT AND PLAN:   72 y.o. female with medical history significant of CVA, depression, GERD, hyperlipidemia, hypertension, admitted for anterior STEMI  Principal Problem:   Acute ST elevation myocardial infarction (STEMI) of anterior wall (HCC) Active Problems:   Cerebral infarction (HCC)   Hyperlipidemia   GERD (gastroesophageal reflux disease)   Hypertension   Chronic kidney disease, stage 3 (HCC)  * Acute ST elevation myocardial infarction (STEMI) of anterior wall (HCC) Acute severe chest pain overnight with noted STEMI on EKG Cardiac catheterization performed with noted occlusion of the mid LAD status post stent placement with Dr. Kirke Corin Continue ASA and Brilinta, statin.  Cardiac rehab at discharge Continue Coreg  Acute systolic CHF EF 30 to 35% on echo this admission Continue Coreg, losartan   Hypertension Blood pressure running high overnight.  Continue Coreg and losartan     GERD (gastroesophageal reflux disease) PPI    Hyperlipidemia High dose statin      Cerebral infarction [facility]) Patient reports remote history of CVA in 2008 and 2012 Continue aspirin and statin   Body mass index is 25.21 kg/m.  Net IO Since Admission: -1,075.43 mL [02/16/23 1410]      LOS: 1 day   Consultants: Cardiology     Status is: Inpatient Remains inpatient appropriate because: Management of STEMI   DVT prophylaxis:       enoxaparin (LOVENOX) injection 40 mg Start: 02/16/23 0800 SCDs Start: 02/15/23 0850     Family Communication: Updated husband at bedside   All the records are reviewed and case discussed with Nursing and TOC team. Management plans discussed with the patient, family and they are in agreement.  CODE STATUS: Full Code Level of care: Progressive  TOTAL TIME TAKING CARE OF THIS PATIENT: 25 minutes.   More than 50% of the time was spent in counseling/coordination of care:  YES  POSSIBLE D/C IN 1 DAYS, DEPENDING ON CLINICAL CONDITION.   Delfino Lovett M.D on 02/16/2023 at 2:10 PM  [facility]   CC: Primary care physician; Jaclyn Shaggy, MD  Note: This dictation was prepared with Dragon dictation along with smaller phrase technology. Any transcriptional errors that result from this process are unintentional.

## 2023-02-16 NOTE — Plan of Care (Signed)
  Problem: Education: Goal: Knowledge of General Education information will improve Description: Including pain rating scale, medication(s)/side effects and non-pharmacologic comfort measures Outcome: Progressing   Problem: Health Behavior/Discharge Planning: Goal: Ability to manage health-related needs will improve Outcome: Progressing   Problem: Clinical Measurements: Goal: Will remain free from infection Outcome: Progressing Goal: Respiratory complications will improve Outcome: Progressing Goal: Cardiovascular complication will be avoided Outcome: Progressing   Problem: Activity: Goal: Risk for activity intolerance will decrease Outcome: Progressing   Problem: Nutrition: Goal: Adequate nutrition will be maintained Outcome: Progressing   Problem: Elimination: Goal: Will not experience complications related to bowel motility Outcome: Progressing Goal: Will not experience complications related to urinary retention Outcome: Progressing   Problem: Skin Integrity: Goal: Risk for impaired skin integrity will decrease Outcome: Progressing

## 2023-02-16 NOTE — Progress Notes (Signed)
Rounding Note    Patient Name: Karina Freeman Date of Encounter: 02/16/2023  [facility] Cardiologist: Dr. Kirke Corin  Subjective   Patient seen on a.m. rounds.  Family remains at bedside.  She denies any chest discomfort or shortness of breath.  Right radial cath site without complications.  Blood pressure has noted to be elevated overnight.  Inpatient Medications    Scheduled Meds:  aspirin  81 mg Oral Daily   atorvastatin  80 mg Oral Daily   carvedilol  6.25 mg Oral BID WC   Chlorhexidine Gluconate Cloth  6 each Topical Daily   enoxaparin (LOVENOX) injection  40 mg Subcutaneous Q24H   losartan  25 mg Oral Daily   sodium chloride flush  3 mL Intravenous Q12H   ticagrelor  90 mg Oral BID   Continuous Infusions:  sodium chloride     sodium chloride     PRN Meds: sodium chloride, acetaminophen, nitroGLYCERIN, ondansetron (ZOFRAN) IV, mouth rinse, sodium chloride flush   Vital Signs    Vitals:   02/16/23 0600 02/16/23 0700 02/16/23 0800 02/16/23 0900  BP: (!) 162/87 (!) 161/86 137/74 120/71  Pulse: 66 79 74 76  Resp: 15 19 13 15   Temp:  97.9 F (36.6 C)    TempSrc:      SpO2: 98% 98% 96% 94%  Weight:      Height:        Intake/Output Summary (Last 24 hours) at 02/16/2023 1101 Last data filed at 02/16/2023 1024 Gross per 24 hour  Intake 724.57 ml  Output 1800 ml  Net -1075.43 ml      02/15/2023   11:08 AM 06/25/2016    3:51 PM 11/11/2015   10:06 AM  Last 3 Weights  Weight (lbs) 163 lb 5.8 oz 181 lb 6.4 oz 174 lb 9.6 oz  Weight (kg) 74.1 kg 82.283 kg 79.198 kg      Telemetry    Sinus rhythm with unifocal PVCs with a rate of 60-70- Personally Reviewed  ECG    No new tracings- Personally Reviewed  Physical Exam   GEN: No acute distress.   Neck: No JVD Cardiac: RRR, no murmurs, rubs, or gallops.  2+ right radial pulse.  Cath site with gauze and OpSite dressing clean dry and intact, no bleeding, bruising, or hematoma noted at site Respiratory:  Clear to auscultation bilaterally. GI: Soft, nontender, non-distended  MS: No edema; No deformity. Neuro:  Nonfocal  Psych: Normal affect   Labs    High Sensitivity Troponin:   Recent Labs  Lab 02/15/23 0700 02/15/23 1011 02/16/23 0918  TROPONINIHS 201* 1,235* 1,092*     Chemistry Recent Labs  Lab 02/15/23 0700 02/16/23 0351  NA 138 141  K 3.1* 3.6  CL 104 106  CO2 20* 25  GLUCOSE 115* 95  BUN 15 13  CREATININE 0.91 0.87  CALCIUM 8.9 8.7*  MG  --  2.0  PROT 7.8  --   ALBUMIN 4.0  --   AST 21  --   ALT 12  --   ALKPHOS 59  --   BILITOT 0.7  --   GFRNONAA >60 >60  ANIONGAP 14 10    Lipids  Recent Labs  Lab 02/15/23 0700  CHOL 241*  TRIG 84  HDL 60  LDLCALC 164*  CHOLHDL 4.0    Hematology Recent Labs  Lab 02/15/23 0700 02/16/23 0351  WBC 10.0 9.3  RBC 4.37 4.33  HGB 13.4 13.1  HCT 39.1 40.2  MCV 89.5 92.8  MCH 30.7 30.3  MCHC 34.3 32.6  RDW 11.9 11.9  PLT 282 278   Thyroid No results for input(s): "TSH", "FREET4" in the last 168 hours.  BNPNo results for input(s): "BNP", "PROBNP" in the last 168 hours.  DDimer No results for input(s): "DDIMER" in the last 168 hours.   Radiology      Cardiac Studies  TTE 02/16/23 1. Left ventricular ejection fraction, by estimation, is 30 to 35%. Left  ventricular ejection fraction by 2D MOD biplane is 33.8 %. The left  ventricle has moderate to severely decreased function. The left ventricle  demonstrates regional wall motion  abnormalities (see scoring diagram/findings for description). There is  mild left ventricular hypertrophy. Left ventricular diastolic parameters  are consistent with Grade I diastolic dysfunction (impaired relaxation).  There is akinesis of the left  ventricular, entire anteroseptal wall. There is akinesis of the left  ventricular, mid-apical anterior segment.   2. Right ventricular systolic function is normal. The right ventricular  size is normal.   3. The mitral valve is normal  in structure. Mild mitral valve  regurgitation.   4. The aortic valve is calcified. Aortic valve regurgitation is not  visualized.   LHC 02/15/23   Prox RCA to Mid RCA lesion is 80% stenosed.   Ost Cx to Prox Cx lesion is 30% stenosed.   LPAV lesion is 50% stenosed.   Mid LAD-1 lesion is 99% stenosed.   Mid LAD-2 lesion is 70% stenosed.   2nd Diag lesion is 40% stenosed.   Dist LAD lesion is 50% stenosed.   Mid LAD-3 lesion is 30% stenosed.   A drug-eluting stent was successfully placed using a STENT ONYX FRONTIER 3.0X34.   Post intervention, there is a 0% residual stenosis.   Post intervention, there is a 0% residual stenosis.   There is moderate left ventricular systolic dysfunction.   LV end diastolic pressure is normal.   The left ventricular ejection fraction is 35-45% by visual estimate.   As long as the patient continues to meet low risk STEMI criteria and in the absence of any other complications or medical issues, we expect the patient to be ready for discharge on 02/16/2023.   Recommend uninterrupted dual antiplatelet therapy with Aspirin 81mg  daily and Ticagrelor 90mg  twice daily for a minimum of 12 months (ACS-Class I recommendation).   1.  Significant two-vessel coronary artery disease.  The culprit for anterior STEMI is 99% stenosis in the mid LAD.  There is also 80% stenosis in the proximal/mid right coronary artery. 2.  Moderately reduced LV systolic function with normal left ventricular end-diastolic pressure. 3.  Successful angioplasty and drug-eluting stent placement to the mid LAD.  A long stent was used to cover the second lesion shortly after second diagonal which was jailed by the stent but had normal TIMI-3 flow.   Recommendations: Dual antiplatelet therapy for 12 months. Aggressive treatment of risk factors. Staged RCA PCI is recommended in the outpatient in few weeks. The patient is a candidate for accelerated discharge in 24-36 hours if her EF is greater than 35%  by echo.    Patient Profile     72 y.o. female with past medical history of prior CVA, essential hypertension, hyperlipidemia, gastroesophageal reflux disease, chronic kidney disease has been seen and evaluated for an anterior STEMI.  Assessment & Plan    Anterior ST elevation myocardial infarction -Patient presented with chest discomfort was found to have anterior STEMI and went for  emergent cardiac catheterization -High-sensitivity troponin peaked at 1235 -Status post successful PCI/DES to the mid LAD -Will need staged PCI for residual 80% stenosis of the proximal/mid RCA in several weeks -Continued on dual antiplatelet therapy of aspirin and Brilinta for minimum of 12 months -Continue on high intensity statin atorvastatin 80 mg daily -Cardiac rehab referral -Remains chest pain-free on exam -Patient needs to be out of bed ambulating today to ensure no recurrent chest discomfort or EKG changes noted -May move out of the ICU today and potential discharge tomorrow if blood pressure and labs remained stable  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 -LVEF 30-35% on echocardiogram on 02/16/2023 -Continued on carvedilol 6.25 mg twice daily and losartan 25 mg daily -Continue to escalate GDMT as tolerated by blood pressure and kidney function -Will need to repeat echocardiogram as outpatient to reevaluate function post MI -Heart failure education -Daily weights, I's and O's, low-sodium diet  Essential hypertension -Blood pressure is elevated in the 160s throughout the night noted -After given dose of carvedilol at this morning blood pressure greatly improved -Continue with carvedilol and losartan -Vital signs per unit protocol  Hyperlipidemia -Continued on high intensity statin with atorvastatin 80 mg daily  Previous CVA -Previously had been on clopidogrel that has been discontinued and she is now on Brilinta -No residual deficits     For questions or updates,  please contact [facility] Please consult [email or web address] for contact info under        Signed,  , NP  02/16/2023, 11:01 AM

## 2023-02-17 ENCOUNTER — Telehealth: Payer: Self-pay | Admitting: Cardiology

## 2023-02-17 ENCOUNTER — Other Ambulatory Visit: Payer: Self-pay

## 2023-02-17 DIAGNOSIS — I213 ST elevation (STEMI) myocardial infarction of unspecified site: Secondary | ICD-10-CM | POA: Diagnosis not present

## 2023-02-17 DIAGNOSIS — I255 Ischemic cardiomyopathy: Secondary | ICD-10-CM | POA: Diagnosis not present

## 2023-02-17 DIAGNOSIS — I10 Essential (primary) hypertension: Secondary | ICD-10-CM | POA: Diagnosis not present

## 2023-02-17 DIAGNOSIS — I2109 ST elevation (STEMI) myocardial infarction involving other coronary artery of anterior wall: Secondary | ICD-10-CM | POA: Diagnosis not present

## 2023-02-17 HISTORY — DX: ST elevation (STEMI) myocardial infarction of unspecified site: I21.3

## 2023-02-17 LAB — BASIC METABOLIC PANEL WITH GFR
Anion gap: 6 (ref 5–15)
BUN: 18 mg/dL (ref 8–23)
CO2: 25 mmol/L (ref 22–32)
Calcium: 8.5 mg/dL — ABNORMAL LOW (ref 8.9–10.3)
Chloride: 108 mmol/L (ref 98–111)
Creatinine, Ser: 1.03 mg/dL — ABNORMAL HIGH (ref 0.44–1.00)
GFR, Estimated: 58 mL/min — ABNORMAL LOW (ref >60)
Glucose, Bld: 98 mg/dL (ref 70–99)
Potassium: 3.7 mmol/L (ref 3.5–5.1)
Sodium: 139 mmol/L (ref 135–145)

## 2023-02-17 LAB — CBC
HCT: 39.3 % (ref 36.0–46.0)
Hemoglobin: 13.1 g/dL (ref 12.0–15.0)
MCH: 30.6 pg (ref 26.0–34.0)
MCHC: 33.3 g/dL (ref 30.0–36.0)
MCV: 91.8 fL (ref 80.0–100.0)
Platelets: 265 10*3/uL (ref 150–400)
RBC: 4.28 MIL/uL (ref 3.87–5.11)
RDW: 12.2 % (ref 11.5–15.5)
WBC: 10.5 10*3/uL (ref 4.0–10.5)
nRBC: 0 % (ref 0.0–0.2)

## 2023-02-17 MED ORDER — LOSARTAN POTASSIUM 25 MG PO TABS: 25.0000 mg | ORAL_TABLET | Freq: Every day | ORAL | 0 refills | Status: DC

## 2023-02-17 MED ORDER — CARVEDILOL 12.5 MG PO TABS
12.5000 mg | ORAL_TABLET | Freq: Two times a day (BID) | ORAL | 0 refills | Status: DC
  Filled 2023-02-17: qty 60, 30d supply, fill #0

## 2023-02-17 MED ORDER — LOSARTAN POTASSIUM 25 MG PO TABS
25.0000 mg | ORAL_TABLET | Freq: Every day | ORAL | 0 refills | Status: DC
  Filled 2023-02-17: qty 30, 30d supply, fill #0

## 2023-02-17 MED ORDER — CARVEDILOL 12.5 MG PO TABS: 12.5000 mg | ORAL_TABLET | Freq: Two times a day (BID) | ORAL | 0 refills | Status: DC

## 2023-02-17 MED ORDER — POTASSIUM CHLORIDE CRYS ER 20 MEQ PO TBCR
20.0000 meq | EXTENDED_RELEASE_TABLET | Freq: Once | ORAL | Status: AC
  Administered 2023-02-17: 20 meq via ORAL
  Filled 2023-02-17: qty 1

## 2023-02-17 MED ORDER — CARVEDILOL 12.5 MG PO TABS: 12.5000 mg | ORAL_TABLET | Freq: Two times a day (BID) | ORAL | Status: DC

## 2023-02-17 MED ORDER — ASPIRIN 81 MG PO CHEW
81.0000 mg | CHEWABLE_TABLET | Freq: Every day | ORAL | 0 refills | Status: DC
  Filled 2023-02-17: qty 30, 30d supply, fill #0

## 2023-02-17 MED ORDER — CARVEDILOL 6.25 MG PO TABS
6.2500 mg | ORAL_TABLET | Freq: Once | ORAL | Status: AC
  Administered 2023-02-17: 6.25 mg via ORAL
  Filled 2023-02-17: qty 1

## 2023-02-17 MED ORDER — LOSARTAN POTASSIUM 25 MG PO TABS
25.0000 mg | ORAL_TABLET | Freq: Once | ORAL | Status: AC
  Administered 2023-02-17: 25 mg via ORAL
  Filled 2023-02-17: qty 1

## 2023-02-17 NOTE — Telephone Encounter (Signed)
   Transition of Care Follow-up Phone Call Request    Patient Name: Karina Freeman Date of Birth: [birth year] Date of Encounter: 02/17/2023  Primary Care Provider:  Jaclyn Shaggy, MD Primary Cardiologist:  Lorine Bears, MD  [name] needs to be scheduled for a transition of care follow up appointment with a [facility] provider: Dr Kirke Corin or APP in 7 days to follow up her recent STEMI. She will need an EKG on arrival.  Please reach out to [name] within 48 hours to confirm appointment and review transition of care protocol questionnaire.   , NP  02/17/2023, 12:44 PM

## 2023-02-17 NOTE — Plan of Care (Signed)
  Problem: Clinical Measurements: Goal: Ability to maintain clinical measurements within normal limits will improve Outcome: Progressing Goal: Will remain free from infection Outcome: Progressing Goal: Diagnostic test results will improve Outcome: Progressing Goal: Respiratory complications will improve Outcome: Progressing Goal: Cardiovascular complication will be avoided Outcome: Progressing   Problem: Elimination: Goal: Will not experience complications related to bowel motility Outcome: Progressing Goal: Will not experience complications related to urinary retention Outcome: Progressing   Problem: Pain Managment: Goal: General experience of comfort will improve Outcome: Progressing   Problem: Safety: Goal: Ability to remain free from injury will improve Outcome: Progressing   Problem: Activity: Goal: Ability to return to baseline activity level will improve Outcome: Progressing

## 2023-02-17 NOTE — Progress Notes (Signed)
Rounding Note    Patient Name: Karina Freeman Date of Encounter: 02/17/2023  [facility] Cardiologist: Lorine Bears, MD   Subjective   Denies chest pain or shortness of breath.  Denies edema.  Ambulated without any symptoms.  Inpatient Medications    Scheduled Meds:  aspirin  81 mg Oral Daily   atorvastatin  80 mg Oral Daily   carvedilol  12.5 mg Oral BID WC   Chlorhexidine Gluconate Cloth  6 each Topical Daily   enoxaparin (LOVENOX) injection  40 mg Subcutaneous Q24H   losartan  25 mg Oral Daily   sodium chloride flush  3 mL Intravenous Q12H   ticagrelor  90 mg Oral BID   Continuous Infusions:  sodium chloride     sodium chloride     PRN Meds: sodium chloride, acetaminophen, nitroGLYCERIN, ondansetron (ZOFRAN) IV, mouth rinse, sodium chloride flush   Vital Signs    Vitals:   02/16/23 2117 02/16/23 2314 02/17/23 0534 02/17/23 1155  BP: (!) 150/78 (!) 143/74 (!) 155/85 (!) 144/85  Pulse: 65 70 70 72  Resp: 18 18 16 18   Temp: 97.9 F (36.6 C) 98.5 F (36.9 C) 98.3 F (36.8 C) 97.6 F (36.4 C)  TempSrc: Oral Oral Oral Oral  SpO2: 98% 99% 98% 94%  Weight:      Height:        Intake/Output Summary (Last 24 hours) at 02/17/2023 1338 Last data filed at 02/17/2023 1025 Gross per 24 hour  Intake 243 ml  Output --  Net 243 ml      02/15/2023   11:08 AM 06/25/2016    3:51 PM 11/11/2015   10:06 AM  Last 3 Weights  Weight (lbs) 163 lb 5.8 oz 181 lb 6.4 oz 174 lb 9.6 oz  Weight (kg) 74.1 kg 82.283 kg 79.198 kg      Telemetry    Sinus rhythm- Personally Reviewed  ECG     - Personally Reviewed  Physical Exam   GEN: No acute distress.   Neck: No JVD Cardiac: RRR, no murmurs, rubs, or gallops.  Respiratory: Clear to auscultation bilaterally. GI: Soft, nontender, non-distended  MS: No edema; No deformity. Neuro:  Nonfocal  Psych: Normal affect   Labs    High Sensitivity Troponin:   Recent Labs  Lab 02/15/23 0700 02/15/23 1011  02/16/23 0918  TROPONINIHS 201* 1,235* 1,092*     Chemistry Recent Labs  Lab 02/15/23 0700 02/16/23 0351 02/17/23 0402  NA 138 141 139  K 3.1* 3.6 3.7  CL 104 106 108  CO2 20* 25 25  GLUCOSE 115* 95 98  BUN 15 13 18   CREATININE 0.91 0.87 1.03*  CALCIUM 8.9 8.7* 8.5*  MG  --  2.0  --   PROT 7.8  --   --   ALBUMIN 4.0  --   --   AST 21  --   --   ALT 12  --   --   ALKPHOS 59  --   --   BILITOT 0.7  --   --   GFRNONAA >60 >60 58*  ANIONGAP 14 10 6     Lipids  Recent Labs  Lab 02/15/23 0700  CHOL 241*  TRIG 84  HDL 60  LDLCALC 164*  CHOLHDL 4.0    Hematology Recent Labs  Lab 02/15/23 0700 02/16/23 0351 02/17/23 0402  WBC 10.0 9.3 10.5  RBC 4.37 4.33 4.28  HGB 13.4 13.1 13.1  HCT 39.1 40.2 39.3  MCV 89.5 92.8  91.8  MCH 30.7 30.3 30.6  MCHC 34.3 32.6 33.3  RDW 11.9 11.9 12.2  PLT 282 278 265   Thyroid No results for input(s): "TSH", "FREET4" in the last 168 hours.  BNPNo results for input(s): "BNP", "PROBNP" in the last 168 hours.  DDimer No results for input(s): "DDIMER" in the last 168 hours.   Radiology    ECHOCARDIOGRAM COMPLETE  Result Date: 02/15/2023    ECHOCARDIOGRAM REPORT   Patient Name:   Karina Freeman Date of Exam: 02/15/2023 Medical Rec #:  [id number]       Height:       67.5 in Accession #:    [id number]      Weight:       163.4 lb Date of Birth:  Jul 10, 1951      BSA:          1.866 m Patient Age:    72 years        BP:           158/88 mmHg Patient Gender: F               HR:           69 bpm. Exam Location:  [facility] Procedure: 2D Echo, Cardiac Doppler and Color Doppler Indications:     Acute myocardial infarction---unspecified I21.9  History:         Patient has no prior history of Echocardiogram examinations.                  Risk Factors:Hypertension and Dyslipidemia.  Sonographer:     Cristela Blue Referring Phys:  [id number] A ARIDA Diagnosing Phys: Debbe Odea MD IMPRESSIONS  1. Left ventricular ejection fraction, by estimation, is 30 to  35%. Left ventricular ejection fraction by 2D MOD biplane is 33.8 %. The left ventricle has moderate to severely decreased function. The left ventricle demonstrates regional wall motion abnormalities (see scoring diagram/findings for description). There is mild left ventricular hypertrophy. Left ventricular diastolic parameters are consistent with Grade I diastolic dysfunction (impaired relaxation). There is akinesis of the left ventricular, entire anteroseptal wall. There is akinesis of the left ventricular, mid-apical anterior segment.  2. Right ventricular systolic function is normal. The right ventricular size is normal.  3. The mitral valve is normal in structure. Mild mitral valve regurgitation.  4. The aortic valve is calcified. Aortic valve regurgitation is not visualized. FINDINGS  Left Ventricle: Left ventricular ejection fraction, by estimation, is 30 to 35%. Left ventricular ejection fraction by 2D MOD biplane is 33.8 %. The left ventricle has moderate to severely decreased function. The left ventricle demonstrates regional wall motion abnormalities. The left ventricular internal cavity size was normal in size. There is mild left ventricular hypertrophy. Left ventricular diastolic parameters are consistent with Grade I diastolic dysfunction (impaired relaxation). Right Ventricle: The right ventricular size is normal. No increase in right ventricular wall thickness. Right ventricular systolic function is normal. Left Atrium: Left atrial size was normal in size. Right Atrium: Right atrial size was normal in size. Pericardium: There is no evidence of pericardial effusion. Mitral Valve: The mitral valve is normal in structure. Mild mitral valve regurgitation. Tricuspid Valve: The tricuspid valve is normal in structure. Tricuspid valve regurgitation is mild. Aortic Valve: The aortic valve is calcified. Aortic valve regurgitation is not visualized. Aortic valve mean gradient measures 2.0 mmHg. Aortic valve  peak gradient measures 3.0 mmHg. Aortic valve area, by VTI measures 3.56 cm. Pulmonic Valve: The pulmonic valve  was not well visualized. Pulmonic valve regurgitation is not visualized. Aorta: The aortic root is normal in size and structure. Venous: The inferior vena cava was not well visualized. IAS/Shunts: No atrial level shunt detected by color flow Doppler.  LEFT VENTRICLE PLAX 2D                        Biplane EF (MOD) LVIDd:         4.00 cm         LV Biplane EF:   Left LVIDs:         2.00 cm                          ventricular LV PW:         1.10 cm                          ejection LV IVS:        1.10 cm                          fraction by LVOT diam:     2.00 cm                          2D MOD LV SV:         50                               biplane is LV SV Index:   27                               33.8 %. LVOT Area:     3.14 cm                                Diastology                                LV e' medial:    5.55 cm/s LV Volumes (MOD)               LV E/e' medial:  10.2 LV vol d, MOD    71.8 ml       LV e' lateral:   5.55 cm/s A2C:                           LV E/e' lateral: 10.2 LV vol d, MOD    69.6 ml A4C: LV vol s, MOD    63.3 ml A2C: LV vol s, MOD    35.2 ml A4C: LV SV MOD A2C:   8.5 ml LV SV MOD A4C:   69.6 ml LV SV MOD BP:    23.8 ml RIGHT VENTRICLE RV Basal diam:  2.60 cm RV Mid diam:    2.20 cm RV S prime:     9.90 cm/s TAPSE (M-mode): 1.7 cm LEFT ATRIUM             Index        RIGHT ATRIUM          Index LA diam:  3.60 cm 1.93 cm/m   RA Area:     8.17 cm LA Vol (A2C):   30.3 ml 16.24 ml/m  RA Volume:   14.00 ml 7.50 ml/m LA Vol (A4C):   23.7 ml 12.70 ml/m LA Biplane Vol: 26.6 ml 14.26 ml/m  AORTIC VALVE AV Area (Vmax):    3.16 cm AV Area (Vmean):   3.33 cm AV Area (VTI):     3.56 cm AV Vmax:           86.50 cm/s AV Vmean:          60.300 cm/s AV VTI:            0.141 m AV Peak Grad:      3.0 mmHg AV Mean Grad:      2.0 mmHg LVOT Vmax:         87.00 cm/s LVOT Vmean:         64.000 cm/s LVOT VTI:          0.160 m LVOT/AV VTI ratio: 1.13  AORTA Ao Root diam: 3.00 cm MITRAL VALVE               TRICUSPID VALVE MV Area (PHT): 1.92 cm    TR Peak grad:   15.8 mmHg MV Decel Time: 396 msec    TR Vmax:        199.00 cm/s MV E velocity: 56.60 cm/s MV A velocity: 75.50 cm/s  SHUNTS MV E/A ratio:  0.75        Systemic VTI:  0.16 m                            Systemic Diam: 2.00 cm Debbe Odea MD Electronically signed by Debbe Odea MD Signature Date/Time: 02/15/2023/4:34:39 PM    Final     Cardiac Studies   Echo EF 30 to 35%  Patient Profile     72 y.o. female with history of hypertension, hyperlipidemia, CVA presenting with chest pain, diagnosed with anterior ST elevated MI.   Assessment & Plan    1.  Anterior STEMI s/p DES to mid LAD -RCA with 80% blockage -Plan staged procedure to RCA as outpatient in about 2 weeks. -Continue aspirin, Brilinta, Lipitor 80 mg daily. -Okay for discharge today.   2.  Ischemic cardiomyopathy EF 30 to 35% -Appears euvolemic -Coreg 12.5 mg twice daily, losartan 50 mg daily. -Titrate GDMT as BP permits.  Okay for discharge today, close follow-up as outpatient in the next week.  Plan for staged procedure to RCA on outpatient basis.  Total encounter time more than 50 minutes  Greater than 50% was spent in counseling and coordination of care with the patient     Signed, Debbe Odea, MD  02/17/2023, 1:38 PM

## 2023-02-17 NOTE — Progress Notes (Signed)
Patient reports having "the best sleep in a long while." Denies any pain including chest pain. Ambulatory to the bathroom after set up with rolling walker. No signs of distress or discomfort noted. Daughter is by bedside.

## 2023-02-17 NOTE — Progress Notes (Signed)
PHARMACY CONSULT NOTE  Pharmacy Consult for Electrolyte Monitoring and Replacement   Recent Labs: Potassium (mmol/L)  Date Value  02/17/2023 3.7   Magnesium (mg/dL)  Date Value  16/04/9603 2.0   Calcium (mg/dL)  Date Value  54/03/8118 8.5 (L)   Albumin (g/dL)  Date Value  14/78/2956 4.0  06/25/2016 4.5   Sodium (mmol/L)  Date Value  02/17/2023 139  06/25/2016 140     Assessment: 72 y.o. female with a hx of prior CVA, essential hypertension, hyperlipidemia, GERD and chronic kidney disease who is being evaluated for anterior STEMI   Goal of Therapy:  Electrolytes WNL  Plan:  ---20 mEq po Kcl x 1 ---recheck electrolytes in am  Bari Mantis PharmD Clinical Pharmacist 02/17/2023

## 2023-02-18 ENCOUNTER — Encounter: Payer: Self-pay | Admitting: Cardiovascular Disease

## 2023-02-18 NOTE — Telephone Encounter (Signed)
Awaiting appointment to be scheduled.

## 2023-02-18 NOTE — Telephone Encounter (Signed)
Scheduled 08/22 at 10:55a with S. Hammock

## 2023-02-18 NOTE — Telephone Encounter (Signed)
Patient contacted regarding discharge from [facility] on 02/17/2023.  Patient understands to follow up with provider Charlsie Quest, NP on 03/07/2023 at 10:55 AM at [facility] location. Patient understands discharge instructions? yes Patient understands medications and regiment? yes Patient understands to bring all medications to this visit? Yes   Patient husband verbalized understanding, thankful for call back.

## 2023-02-18 NOTE — Discharge Summary (Signed)
Physician Discharge Summary   Patient: Karina Freeman MRN: [id number] DOB: 05/10/1951  Admit date:     02/15/2023  Discharge date: 02/17/2023  Discharge Physician: Delfino Lovett   PCP: Jaclyn Shaggy, MD   Recommendations at discharge:    F/up with outpt providers as requested  Discharge Diagnoses: Principal Problem:   Acute ST elevation myocardial infarction (STEMI) of anterior wall [facility]) Active Problems:   Cerebral infarction (HCC)   Hyperlipidemia   GERD (gastroesophageal reflux disease)   Hypertension   Chronic kidney disease, stage 3 (HCC)   Ischemic cardiomyopathy   ST elevation myocardial infarction (STEMI) [facility])  Hospital Course: Assessment and Plan:  72 y.o. female with medical history significant of CVA, depression, GERD, hyperlipidemia, hypertension, admitted for anterior STEMI   Principal Problem:   Acute ST elevation myocardial infarction (STEMI) of anterior wall (HCC) Active Problems:   Cerebral infarction (HCC)   Hyperlipidemia   GERD (gastroesophageal reflux disease)   Hypertension   Chronic kidney disease, stage 3 (HCC)   * Acute ST elevation myocardial infarction (STEMI) of anterior wall [facility]) Cardiac catheterization performed with noted occlusion of the mid LAD status post stent placement with Dr. Kirke Corin Continue ASA and Brilinta, statin.  Cardiac rehab at discharge Continue Coreg - RCA with 80% blockage -Plan staged procedure to RCA as outpatient in about 2 weeks.   Ischemic cardiomyopathy EF 30 to 35% euvolemic -Coreg & losartan at DC   Hypertension Continue Coreg and losartan    GERD (gastroesophageal reflux disease) PPI    Hyperlipidemia High dose statin     Cerebral infarction [facility]) Patient reports remote history of CVA in 2008 and 2012 Continue aspirin and statin     Body mass index is 25.21 kg/m.        Consultants: Cardio Procedures performed: Cardiac cath  Disposition: Home Diet recommendation:  Discharge Diet Orders  (From admission, onward)     Start     Ordered   02/17/23 0000  Diet - low sodium heart healthy        02/17/23 1149           Carb modified diet DISCHARGE MEDICATION: Allergies as of 02/17/2023       Reactions   Citalopram Hydrobromide Nausea And Vomiting   Codeine Diarrhea, Nausea And Vomiting   Penicillin G Benzathine         Medication List     STOP taking these medications    clopidogrel 75 MG tablet Commonly known as: PLAVIX       TAKE these medications    aspirin 81 MG chewable tablet Chew 1 tablet (81 mg total) by mouth daily.   atorvastatin 80 MG tablet Commonly known as: Lipitor Take 1 tablet (80 mg total) by mouth daily.   Brilinta 90 MG Tabs tablet Generic drug: ticagrelor Take 1 tablet (90 mg total) by mouth 2 (two) times daily.   carvedilol 12.5 MG tablet Commonly known as: COREG Take 1 tablet (12.5 mg total) by mouth 2 (two) times daily with a meal.   losartan 25 MG tablet Commonly known as: COZAAR Take 1 tablet (25 mg total) by mouth daily.        Follow-up Information     Jaclyn Shaggy, MD. Schedule an appointment as soon as possible for a visit in 1 week(s).   Specialty: Internal Medicine Why: [facility] Discharge F/UP Contact information: [address]   [place] [address] [phone]  Debbe Odea, MD. Schedule an appointment as soon as possible for a visit in 2 week(s).   Specialties: Cardiology, Radiology Why: [facility] Discharge F/UP Contact information: [address] [place] [address] [phone]                Discharge Exam: [name] Weights   02/15/23 1108  Weight: 74.1 kg   Constitutional:      Appearance: She is well-developed.  HENT:     Head: Normocephalic and atraumatic.  Eyes:     Extraocular Movements: Extraocular movements intact.     Pupils: Pupils are equal, round, and reactive to light.  Cardiovascular:     Rate and Rhythm: Normal rate and  regular rhythm.     Heart sounds: Normal heart sounds.  Pulmonary:     Effort: Pulmonary effort is normal.  Abdominal:     General: Bowel sounds are normal.     Palpations: Abdomen is soft.  Musculoskeletal:        General: Normal range of motion.     Cervical back: Normal range of motion and neck supple.  Neurological:     General: No focal deficit present.     Mental Status: She is alert and oriented to person, place, and time.  Psychiatric:        Mood and Affect: Mood normal.        Behavior: Behavior normal.     Condition at discharge: good  The results of significant diagnostics from this hospitalization (including imaging, microbiology, ancillary and laboratory) are listed below for reference.   Imaging Studies: ECHOCARDIOGRAM COMPLETE  Result Date: 02/15/2023    ECHOCARDIOGRAM REPORT   Patient Name:   Karina Freeman Date of Exam: 02/15/2023 Medical Rec #:  [id number]       Height:       67.5 in Accession #:    [id number]      Weight:       163.4 lb Date of Birth:  1951/05/01      BSA:          1.866 m Patient Age:    71 years        BP:           158/88 mmHg Patient Gender: F               HR:           69 bpm. Exam Location:  [facility] Procedure: 2D Echo, Cardiac Doppler and Color Doppler Indications:     Acute myocardial infarction---unspecified I21.9  History:         Patient has no prior history of Echocardiogram examinations.                  Risk Factors:Hypertension and Dyslipidemia.  Sonographer:     Cristela Blue Referring Phys:  [id number] A ARIDA Diagnosing Phys: Debbe Odea MD IMPRESSIONS  1. Left ventricular ejection fraction, by estimation, is 30 to 35%. Left ventricular ejection fraction by 2D MOD biplane is 33.8 %. The left ventricle has moderate to severely decreased function. The left ventricle demonstrates regional wall motion abnormalities (see scoring diagram/findings for description). There is mild left ventricular hypertrophy. Left ventricular diastolic  parameters are consistent with Grade I diastolic dysfunction (impaired relaxation). There is akinesis of the left ventricular, entire anteroseptal wall. There is akinesis of the left ventricular, mid-apical anterior segment.  2. Right ventricular systolic function is normal. The right ventricular size is normal.  3. The mitral  valve is normal in structure. Mild mitral valve regurgitation.  4. The aortic valve is calcified. Aortic valve regurgitation is not visualized. FINDINGS  Left Ventricle: Left ventricular ejection fraction, by estimation, is 30 to 35%. Left ventricular ejection fraction by 2D MOD biplane is 33.8 %. The left ventricle has moderate to severely decreased function. The left ventricle demonstrates regional wall motion abnormalities. The left ventricular internal cavity size was normal in size. There is mild left ventricular hypertrophy. Left ventricular diastolic parameters are consistent with Grade I diastolic dysfunction (impaired relaxation). Right Ventricle: The right ventricular size is normal. No increase in right ventricular wall thickness. Right ventricular systolic function is normal. Left Atrium: Left atrial size was normal in size. Right Atrium: Right atrial size was normal in size. Pericardium: There is no evidence of pericardial effusion. Mitral Valve: The mitral valve is normal in structure. Mild mitral valve regurgitation. Tricuspid Valve: The tricuspid valve is normal in structure. Tricuspid valve regurgitation is mild. Aortic Valve: The aortic valve is calcified. Aortic valve regurgitation is not visualized. Aortic valve mean gradient measures 2.0 mmHg. Aortic valve peak gradient measures 3.0 mmHg. Aortic valve area, by VTI measures 3.56 cm. Pulmonic Valve: The pulmonic valve was not well visualized. Pulmonic valve regurgitation is not visualized. Aorta: The aortic root is normal in size and structure. Venous: The inferior vena cava was not well visualized. IAS/Shunts: No atrial  level shunt detected by color flow Doppler.  LEFT VENTRICLE PLAX 2D                        Biplane EF (MOD) LVIDd:         4.00 cm         LV Biplane EF:   Left LVIDs:         2.00 cm                          ventricular LV PW:         1.10 cm                          ejection LV IVS:        1.10 cm                          fraction by LVOT diam:     2.00 cm                          2D MOD LV SV:         50                               biplane is LV SV Index:   27                               33.8 %. LVOT Area:     3.14 cm                                Diastology  LV e' medial:    5.55 cm/s LV Volumes (MOD)               LV E/e' medial:  10.2 LV vol d, MOD    71.8 ml       LV e' lateral:   5.55 cm/s A2C:                           LV E/e' lateral: 10.2 LV vol d, MOD    69.6 ml A4C: LV vol s, MOD    63.3 ml A2C: LV vol s, MOD    35.2 ml A4C: LV SV MOD A2C:   8.5 ml LV SV MOD A4C:   69.6 ml LV SV MOD BP:    23.8 ml RIGHT VENTRICLE RV Basal diam:  2.60 cm RV Mid diam:    2.20 cm RV S prime:     9.90 cm/s TAPSE (M-mode): 1.7 cm LEFT ATRIUM             Index        RIGHT ATRIUM          Index LA diam:        3.60 cm 1.93 cm/m   RA Area:     8.17 cm LA Vol (A2C):   30.3 ml 16.24 ml/m  RA Volume:   14.00 ml 7.50 ml/m LA Vol (A4C):   23.7 ml 12.70 ml/m LA Biplane Vol: 26.6 ml 14.26 ml/m  AORTIC VALVE AV Area (Vmax):    3.16 cm AV Area (Vmean):   3.33 cm AV Area (VTI):     3.56 cm AV Vmax:           86.50 cm/s AV Vmean:          60.300 cm/s AV VTI:            0.141 m AV Peak Grad:      3.0 mmHg AV Mean Grad:      2.0 mmHg LVOT Vmax:         87.00 cm/s LVOT Vmean:        64.000 cm/s LVOT VTI:          0.160 m LVOT/AV VTI ratio: 1.13  AORTA Ao Root diam: 3.00 cm MITRAL VALVE               TRICUSPID VALVE MV Area (PHT): 1.92 cm    TR Peak grad:   15.8 mmHg MV Decel Time: 396 msec    TR Vmax:        199.00 cm/s MV E velocity: 56.60 cm/s MV A velocity: 75.50 cm/s  SHUNTS MV E/A ratio:   0.75        Systemic VTI:  0.16 m                            Systemic Diam: 2.00 cm Debbe Odea MD Electronically signed by Debbe Odea MD Signature Date/Time: 02/15/2023/4:34:39 PM    Final    CARDIAC CATHETERIZATION  Result Date: 02/15/2023   Prox RCA to Mid RCA lesion is 80% stenosed.   Ost Cx to Prox Cx lesion is 30% stenosed.   LPAV lesion is 50% stenosed.   Mid LAD-1 lesion is 99% stenosed.   Mid LAD-2 lesion is 70% stenosed.   2nd Diag lesion is 40% stenosed.   Dist LAD lesion is 50% stenosed.   Mid LAD-3 lesion is  30% stenosed.   A drug-eluting stent was successfully placed using a STENT ONYX FRONTIER 3.0X34.   Post intervention, there is a 0% residual stenosis.   Post intervention, there is a 0% residual stenosis.   There is moderate left ventricular systolic dysfunction.   LV end diastolic pressure is normal.   The left ventricular ejection fraction is 35-45% by visual estimate.   As long as the patient continues to meet low risk STEMI criteria and in the absence of any other complications or medical issues, we expect the patient to be ready for discharge on 02/16/2023.   Recommend uninterrupted dual antiplatelet therapy with Aspirin 81mg  daily and Ticagrelor 90mg  twice daily for a minimum of 12 months (ACS-Class I recommendation). 1.  Significant two-vessel coronary artery disease.  The culprit for anterior STEMI is 99% stenosis in the mid LAD.  There is also 80% stenosis in the proximal/mid right coronary artery. 2.  Moderately reduced LV systolic function with normal left ventricular end-diastolic pressure. 3.  Successful angioplasty and drug-eluting stent placement to the mid LAD.  A long stent was used to cover the second lesion shortly after second diagonal which was jailed by the stent but had normal TIMI-3 flow. Recommendations: Dual antiplatelet therapy for 12 months. Aggressive treatment of risk factors. Staged RCA PCI is recommended in the outpatient in few weeks. The patient is a  candidate for accelerated discharge in 24-36 hours if her EF is greater than 35% by echo.    Microbiology: Results for orders placed or performed during the hospital encounter of 02/15/23  MRSA Next Gen by PCR, Nasal     Status: None   Collection Time: 02/15/23  8:50 AM   Specimen: Nasal Mucosa; Nasal Swab  Result Value Ref Range Status   MRSA by PCR Next Gen NOT DETECTED NOT DETECTED Final    Comment: (NOTE) The GeneXpert MRSA Assay (FDA approved for NASAL specimens only), is one component of a comprehensive MRSA colonization surveillance program. It is not intended to diagnose MRSA infection nor to guide or monitor treatment for MRSA infections. Test performance is not FDA approved in patients less than 66 years old. Performed at [facility], [address] [place], [place] [address]     Labs: CBC: Recent Labs  Lab 02/15/23 0700 02/16/23 0351 02/17/23 0402  WBC 10.0 9.3 10.5  NEUTROABS 4.8  --   --   HGB 13.4 13.1 13.1  HCT 39.1 40.2 39.3  MCV 89.5 92.8 91.8  PLT 282 278 265   Basic Metabolic Panel: Recent Labs  Lab 02/15/23 0700 02/16/23 0351 02/17/23 0402  NA 138 141 139  K 3.1* 3.6 3.7  CL 104 106 108  CO2 20* 25 25  GLUCOSE 115* 95 98  BUN 15 13 18   CREATININE 0.91 0.87 1.03*  CALCIUM 8.9 8.7* 8.5*  MG  --  2.0  --    Liver Function Tests: Recent Labs  Lab 02/15/23 0700  AST 21  ALT 12  ALKPHOS 59  BILITOT 0.7  PROT 7.8  ALBUMIN 4.0   CBG: Recent Labs  Lab 02/15/23 0827  GLUCAP 96    Discharge time spent: greater than 30 minutes.  Signed: Delfino Lovett, MD [facility] 02/18/2023

## 2023-02-20 DIAGNOSIS — I10 Essential (primary) hypertension: Secondary | ICD-10-CM | POA: Diagnosis not present

## 2023-02-20 DIAGNOSIS — R079 Chest pain, unspecified: Secondary | ICD-10-CM | POA: Diagnosis not present

## 2023-02-21 ENCOUNTER — Encounter
Admission: EM | Disposition: A | Discharge status: Home / Self Care | Payer: Self-pay | Source: Home / Self Care | Attending: Emergency Medicine

## 2023-02-21 ENCOUNTER — Encounter: Payer: Self-pay | Admitting: Family Medicine

## 2023-02-21 ENCOUNTER — Other Ambulatory Visit: Payer: Self-pay

## 2023-02-21 ENCOUNTER — Emergency Department: Payer: Medicare Other

## 2023-02-21 ENCOUNTER — Observation Stay
Admission: EM | Admit: 2023-02-21 | Discharge: 2023-02-22 | Disposition: A | Discharge status: Home / Self Care | Payer: Medicare Other | Source: Home / Self Care | Attending: Emergency Medicine | Admitting: Emergency Medicine

## 2023-02-21 DIAGNOSIS — F419 Anxiety disorder, unspecified: Secondary | ICD-10-CM | POA: Insufficient documentation

## 2023-02-21 DIAGNOSIS — Z87891 Personal history of nicotine dependence: Secondary | ICD-10-CM | POA: Insufficient documentation

## 2023-02-21 DIAGNOSIS — I252 Old myocardial infarction: Secondary | ICD-10-CM | POA: Diagnosis not present

## 2023-02-21 DIAGNOSIS — I255 Ischemic cardiomyopathy: Secondary | ICD-10-CM | POA: Diagnosis not present

## 2023-02-21 DIAGNOSIS — Z955 Presence of coronary angioplasty implant and graft: Secondary | ICD-10-CM | POA: Diagnosis not present

## 2023-02-21 DIAGNOSIS — R079 Chest pain, unspecified: Secondary | ICD-10-CM | POA: Diagnosis not present

## 2023-02-21 DIAGNOSIS — Z1152 Encounter for screening for COVID-19: Secondary | ICD-10-CM | POA: Insufficient documentation

## 2023-02-21 DIAGNOSIS — J3489 Other specified disorders of nose and nasal sinuses: Secondary | ICD-10-CM | POA: Insufficient documentation

## 2023-02-21 DIAGNOSIS — F32A Depression, unspecified: Secondary | ICD-10-CM | POA: Insufficient documentation

## 2023-02-21 DIAGNOSIS — I340 Nonrheumatic mitral (valve) insufficiency: Secondary | ICD-10-CM | POA: Diagnosis not present

## 2023-02-21 DIAGNOSIS — M810 Age-related osteoporosis without current pathological fracture: Secondary | ICD-10-CM | POA: Insufficient documentation

## 2023-02-21 DIAGNOSIS — I25110 Atherosclerotic heart disease of native coronary artery with unstable angina pectoris: Principal | ICD-10-CM

## 2023-02-21 DIAGNOSIS — E785 Hyperlipidemia, unspecified: Secondary | ICD-10-CM

## 2023-02-21 DIAGNOSIS — Z8673 Personal history of transient ischemic attack (TIA), and cerebral infarction without residual deficits: Secondary | ICD-10-CM | POA: Insufficient documentation

## 2023-02-21 DIAGNOSIS — I5021 Acute systolic (congestive) heart failure: Secondary | ICD-10-CM

## 2023-02-21 DIAGNOSIS — I5042 Chronic combined systolic (congestive) and diastolic (congestive) heart failure: Secondary | ICD-10-CM | POA: Insufficient documentation

## 2023-02-21 DIAGNOSIS — I110 Hypertensive heart disease with heart failure: Secondary | ICD-10-CM | POA: Insufficient documentation

## 2023-02-21 DIAGNOSIS — R9431 Abnormal electrocardiogram [ECG] [EKG]: Secondary | ICD-10-CM

## 2023-02-21 DIAGNOSIS — Z79899 Other long term (current) drug therapy: Secondary | ICD-10-CM | POA: Insufficient documentation

## 2023-02-21 DIAGNOSIS — K219 Gastro-esophageal reflux disease without esophagitis: Secondary | ICD-10-CM | POA: Diagnosis not present

## 2023-02-21 DIAGNOSIS — Z7982 Long term (current) use of aspirin: Secondary | ICD-10-CM | POA: Diagnosis not present

## 2023-02-21 DIAGNOSIS — R0789 Other chest pain: Secondary | ICD-10-CM | POA: Diagnosis not present

## 2023-02-21 DIAGNOSIS — I10 Essential (primary) hypertension: Secondary | ICD-10-CM | POA: Diagnosis not present

## 2023-02-21 HISTORY — PX: LEFT HEART CATH AND CORONARY ANGIOGRAPHY: CATH118249

## 2023-02-21 HISTORY — PX: CORONARY PRESSURE/FFR STUDY: CATH118243

## 2023-02-21 HISTORY — PX: CORONARY STENT INTERVENTION: CATH118234

## 2023-02-21 HISTORY — PX: CORONARY IMAGING/OCT: CATH118326

## 2023-02-21 LAB — BASIC METABOLIC PANEL
Anion gap: 3 — ABNORMAL LOW (ref 5–15)
Anion gap: 5 (ref 5–15)
BUN: 16 mg/dL (ref 8–23)
BUN: 18 mg/dL (ref 8–23)
CO2: 22 mmol/L (ref 22–32)
CO2: 23 mmol/L (ref 22–32)
Calcium: 7.6 mg/dL — ABNORMAL LOW (ref 8.9–10.3)
Calcium: 8.4 mg/dL — ABNORMAL LOW (ref 8.9–10.3)
Chloride: 106 mmol/L (ref 98–111)
Chloride: 110 mmol/L (ref 98–111)
Creatinine, Ser: 1.07 mg/dL — ABNORMAL HIGH (ref 0.44–1.00)
Creatinine, Ser: 1.13 mg/dL — ABNORMAL HIGH (ref 0.44–1.00)
GFR, Estimated: 52 mL/min — ABNORMAL LOW (ref >60)
GFR, Estimated: 55 mL/min — ABNORMAL LOW (ref >60)
Glucose, Bld: 112 mg/dL — ABNORMAL HIGH (ref 70–99)
Glucose, Bld: 97 mg/dL (ref 70–99)
Potassium: 3.5 mmol/L (ref 3.5–5.1)
Potassium: 3.8 mmol/L (ref 3.5–5.1)
Sodium: 134 mmol/L — ABNORMAL LOW (ref 135–145)
Sodium: 135 mmol/L (ref 135–145)

## 2023-02-21 LAB — CBC
HCT: 36.6 % (ref 36.0–46.0)
HCT: 37.2 % (ref 36.0–46.0)
Hemoglobin: 12.1 g/dL (ref 12.0–15.0)
Hemoglobin: 12.2 g/dL (ref 12.0–15.0)
MCH: 30.6 pg (ref 26.0–34.0)
MCH: 31 pg (ref 26.0–34.0)
MCHC: 32.5 g/dL (ref 30.0–36.0)
MCHC: 33.3 g/dL (ref 30.0–36.0)
MCV: 92.9 fL (ref 80.0–100.0)
MCV: 93.9 fL (ref 80.0–100.0)
Platelets: 246 10*3/uL (ref 150–400)
Platelets: 290 10*3/uL (ref 150–400)
RBC: 3.94 MIL/uL (ref 3.87–5.11)
RBC: 3.96 MIL/uL (ref 3.87–5.11)
RDW: 11.9 % (ref 11.5–15.5)
RDW: 11.9 % (ref 11.5–15.5)
WBC: 8.3 10*3/uL (ref 4.0–10.5)
WBC: 8.6 10*3/uL (ref 4.0–10.5)
nRBC: 0 % (ref 0.0–0.2)
nRBC: 0 % (ref 0.0–0.2)

## 2023-02-21 LAB — HEPATIC FUNCTION PANEL
ALT: 23 U/L (ref 0–44)
AST: 23 U/L (ref 15–41)
Albumin: 3.5 g/dL (ref 3.5–5.0)
Alkaline Phosphatase: 58 U/L (ref 38–126)
Bilirubin, Direct: <0.1 mg/dL (ref 0.0–0.2)
Total Bilirubin: 0.5 mg/dL (ref 0.3–1.2)
Total Protein: 6.7 g/dL (ref 6.5–8.1)

## 2023-02-21 LAB — TROPONIN I (HIGH SENSITIVITY)
Troponin I (High Sensitivity): 111 ng/L (ref <18)
Troponin I (High Sensitivity): 90 ng/L — ABNORMAL HIGH (ref <18)

## 2023-02-21 LAB — SARS CORONAVIRUS 2 BY RT PCR: SARS Coronavirus 2 by RT PCR: NEGATIVE

## 2023-02-21 LAB — POCT ACTIVATED CLOTTING TIME
Activated Clotting Time: 269 seconds
Activated Clotting Time: 275 seconds
Activated Clotting Time: 287 seconds
Activated Clotting Time: 324 seconds

## 2023-02-21 LAB — BRAIN NATRIURETIC PEPTIDE: B Natriuretic Peptide: 196.5 pg/mL — ABNORMAL HIGH (ref 0.0–100.0)

## 2023-02-21 SURGERY — LEFT HEART CATH AND CORONARY ANGIOGRAPHY
Anesthesia: Moderate Sedation

## 2023-02-21 MED ORDER — MAGNESIUM HYDROXIDE 400 MG/5ML PO SUSP: 30.0000 mL | Freq: Every day | ORAL | Status: DC | PRN

## 2023-02-21 MED ORDER — SODIUM CHLORIDE 0.9 % IV SOLN: INTRAVENOUS | Status: AC

## 2023-02-21 MED ORDER — SODIUM CHLORIDE 0.9 % IV SOLN: INTRAVENOUS | Status: DC

## 2023-02-21 MED ORDER — MIDAZOLAM HCL 2 MG/2ML IJ SOLN
INTRAMUSCULAR | Status: DC | PRN
  Administered 2023-02-21: 1 mg via INTRAVENOUS

## 2023-02-21 MED ORDER — IOHEXOL 300 MG/ML  SOLN
INTRAMUSCULAR | Status: DC | PRN
  Administered 2023-02-21: 114 mL

## 2023-02-21 MED ORDER — HEPARIN SODIUM (PORCINE) 1000 UNIT/ML IJ SOLN
INTRAMUSCULAR | Status: AC
  Filled 2023-02-21: qty 10

## 2023-02-21 MED ORDER — LIDOCAINE HCL (PF) 1 % IJ SOLN
INTRAMUSCULAR | Status: DC | PRN
  Administered 2023-02-21: 30 mL

## 2023-02-21 MED ORDER — LIDOCAINE HCL 1 % IJ SOLN
INTRAMUSCULAR | Status: AC
  Filled 2023-02-21: qty 20

## 2023-02-21 MED ORDER — HYDRALAZINE HCL 20 MG/ML IJ SOLN: 10.0000 mg | INTRAMUSCULAR | Status: AC | PRN

## 2023-02-21 MED ORDER — VERAPAMIL HCL 2.5 MG/ML IV SOLN
INTRAVENOUS | Status: DC | PRN
  Administered 2023-02-21 (×2): 2.5 mg via INTRAVENOUS

## 2023-02-21 MED ORDER — TICAGRELOR 90 MG PO TABS
90.0000 mg | ORAL_TABLET | Freq: Two times a day (BID) | ORAL | Status: DC
  Administered 2023-02-21 – 2023-02-22 (×3): 90 mg via ORAL
  Filled 2023-02-21 (×3): qty 1

## 2023-02-21 MED ORDER — ASPIRIN 81 MG PO CHEW
81.0000 mg | CHEWABLE_TABLET | Freq: Every day | ORAL | Status: DC
  Administered 2023-02-21 – 2023-02-22 (×2): 81 mg via ORAL
  Filled 2023-02-21 (×2): qty 1

## 2023-02-21 MED ORDER — ENOXAPARIN SODIUM 40 MG/0.4ML IJ SOSY
40.0000 mg | PREFILLED_SYRINGE | INTRAMUSCULAR | Status: DC
  Administered 2023-02-22: 40 mg via SUBCUTANEOUS
  Filled 2023-02-21: qty 0.4

## 2023-02-21 MED ORDER — FENTANYL CITRATE (PF) 100 MCG/2ML IJ SOLN
INTRAMUSCULAR | Status: AC
  Filled 2023-02-21: qty 2

## 2023-02-21 MED ORDER — ENOXAPARIN SODIUM 40 MG/0.4ML IJ SOSY: 40.0000 mg | PREFILLED_SYRINGE | INTRAMUSCULAR | Status: DC

## 2023-02-21 MED ORDER — SODIUM CHLORIDE 0.9% FLUSH: 3.0000 mL | INTRAVENOUS | Status: DC | PRN

## 2023-02-21 MED ORDER — TRAZODONE HCL 50 MG PO TABS: 25.0000 mg | ORAL_TABLET | Freq: Every evening | ORAL | Status: DC | PRN

## 2023-02-21 MED ORDER — ONDANSETRON HCL 4 MG PO TABS: 4.0000 mg | ORAL_TABLET | Freq: Four times a day (QID) | ORAL | Status: DC | PRN

## 2023-02-21 MED ORDER — ORAL CARE MOUTH RINSE: 15.0000 mL | OROMUCOSAL | Status: DC | PRN

## 2023-02-21 MED ORDER — FENTANYL CITRATE (PF) 100 MCG/2ML IJ SOLN
INTRAMUSCULAR | Status: DC | PRN
  Administered 2023-02-21: 25 ug via INTRAVENOUS

## 2023-02-21 MED ORDER — NITROGLYCERIN 1 MG/10 ML FOR IR/CATH LAB
INTRA_ARTERIAL | Status: AC
  Filled 2023-02-21: qty 10

## 2023-02-21 MED ORDER — ASPIRIN 81 MG PO CHEW: 81.0000 mg | CHEWABLE_TABLET | ORAL | Status: DC

## 2023-02-21 MED ORDER — LOSARTAN POTASSIUM 25 MG PO TABS
25.0000 mg | ORAL_TABLET | Freq: Every day | ORAL | Status: DC
  Administered 2023-02-21: 25 mg via ORAL
  Filled 2023-02-21: qty 1

## 2023-02-21 MED ORDER — CARVEDILOL 12.5 MG PO TABS
12.5000 mg | ORAL_TABLET | Freq: Two times a day (BID) | ORAL | Status: DC
  Administered 2023-02-21 – 2023-02-22 (×2): 12.5 mg via ORAL
  Filled 2023-02-21 (×2): qty 1

## 2023-02-21 MED ORDER — VERAPAMIL HCL 2.5 MG/ML IV SOLN
INTRAVENOUS | Status: AC
  Filled 2023-02-21: qty 2

## 2023-02-21 MED ORDER — ACETAMINOPHEN 325 MG PO TABS
650.0000 mg | ORAL_TABLET | Freq: Four times a day (QID) | ORAL | Status: DC | PRN
  Administered 2023-02-21: 650 mg via ORAL
  Filled 2023-02-21: qty 2

## 2023-02-21 MED ORDER — HEPARIN SODIUM (PORCINE) 1000 UNIT/ML IJ SOLN
INTRAMUSCULAR | Status: DC | PRN
  Administered 2023-02-21: 4000 [IU] via INTRAVENOUS
  Administered 2023-02-21 (×3): 2000 [IU] via INTRAVENOUS
  Administered 2023-02-21: 4000 [IU] via INTRAVENOUS

## 2023-02-21 MED ORDER — ACETAMINOPHEN 650 MG RE SUPP: 650.0000 mg | Freq: Four times a day (QID) | RECTAL | Status: DC | PRN

## 2023-02-21 MED ORDER — ATORVASTATIN CALCIUM 80 MG PO TABS
80.0000 mg | ORAL_TABLET | Freq: Every day | ORAL | Status: DC
  Administered 2023-02-21 – 2023-02-22 (×2): 80 mg via ORAL
  Filled 2023-02-21 (×2): qty 1

## 2023-02-21 MED ORDER — SODIUM CHLORIDE 0.9 % IV SOLN: 250.0000 mL | INTRAVENOUS | Status: DC | PRN

## 2023-02-21 MED ORDER — HEPARIN (PORCINE) IN NACL 1000-0.9 UT/500ML-% IV SOLN
INTRAVENOUS | Status: AC
  Filled 2023-02-21: qty 1000

## 2023-02-21 MED ORDER — NITROGLYCERIN 1 MG/10 ML FOR IR/CATH LAB
INTRA_ARTERIAL | Status: DC | PRN
  Administered 2023-02-21: 100 ug
  Administered 2023-02-21: 200 ug

## 2023-02-21 MED ORDER — MIDAZOLAM HCL 2 MG/2ML IJ SOLN
INTRAMUSCULAR | Status: AC
  Filled 2023-02-21: qty 2

## 2023-02-21 MED ORDER — SODIUM CHLORIDE 0.9% FLUSH
3.0000 mL | Freq: Two times a day (BID) | INTRAVENOUS | Status: DC
  Administered 2023-02-21 – 2023-02-22 (×2): 3 mL via INTRAVENOUS

## 2023-02-21 MED ORDER — SODIUM CHLORIDE 0.9 % IV BOLUS
INTRAVENOUS | Status: DC | PRN
  Administered 2023-02-21: 250 mL via INTRAVENOUS

## 2023-02-21 MED ORDER — HEPARIN (PORCINE) IN NACL 2000-0.9 UNIT/L-% IV SOLN
INTRAVENOUS | Status: DC | PRN
  Administered 2023-02-21: 1000 mL

## 2023-02-21 MED ORDER — ASPIRIN 81 MG PO CHEW
324.0000 mg | CHEWABLE_TABLET | Freq: Once | ORAL | Status: AC
  Administered 2023-02-21: 324 mg via ORAL
  Filled 2023-02-21: qty 4

## 2023-02-21 MED ORDER — ONDANSETRON HCL 4 MG/2ML IJ SOLN: 4.0000 mg | Freq: Four times a day (QID) | INTRAMUSCULAR | Status: DC | PRN

## 2023-02-21 SURGICAL SUPPLY — 20 items
BALLN ~~LOC~~ TREK NEO RX 3.25X12 (BALLOONS) IMPLANT
CATH 5FR JL3.5 JR4 ANG PIG MP (CATHETERS) IMPLANT
CATH DRAGONFLY OPSTAR (CATHETERS) IMPLANT
CATH LAUNCHER 6FR EBU3.5 (CATHETERS) IMPLANT
CATH LAUNCHER 6FR JR4 (CATHETERS) IMPLANT
DEVICE RAD TR BAND REGULAR (VASCULAR PRODUCTS) IMPLANT
DRAPE BRACHIAL (DRAPES) IMPLANT
DRAPE FEMORAL ANGIO W/ POUCH (DRAPES) IMPLANT
GLIDESHEATH SLEND SS 6F .021 (SHEATH) IMPLANT
GUIDEWIRE INQWIRE 1.5J.035X260 (WIRE) IMPLANT
GUIDEWIRE PRESSURE X 175 (WIRE) IMPLANT
INQWIRE 1.5J .035X260CM (WIRE) ×1
KIT ENCORE 26 ADVANTAGE (KITS) IMPLANT
PACK CARDIAC CATH (CUSTOM PROCEDURE TRAY) ×1 IMPLANT
PROTECTION STATION PRESSURIZED (MISCELLANEOUS) ×1
SET ATX-X65L (MISCELLANEOUS) IMPLANT
STATION PROTECTION PRESSURIZED (MISCELLANEOUS) IMPLANT
STENT ONYX FRONTIER 3.0X08 (Permanent Stent) IMPLANT
WIRE G HI TQ BMW 190 (WIRE) IMPLANT
WIRE RUNTHROUGH .014X180CM (WIRE) IMPLANT

## 2023-02-21 NOTE — H&P (View-Only) (Signed)
Cardiology Consult    Patient ID: Karina Freeman MRN: [id number], DOB/AGE: Apr 07, 1951   Admit date: 02/21/2023 Date of Consult: 02/21/2023  Primary Physician: Reubin Milan, MD Primary Cardiologist: Lorine Bears, MD Requesting Provider: Arvid Right, MD  Patient Profile    Karina Freeman is a 72 y.o. 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 Joylene Igo.  Past Medical History   Past Medical History:  Diagnosis Date   Abnormal Pap smear of cervix 11/11/2015   Anxiety    CVA (cerebral infarction)    Depressive disorder    GERD (gastroesophageal reflux disease)    Hyperlipidemia    Hypertension    IFG (impaired fasting glucose)    Osteoporosis     Past Surgical History:  Procedure Laterality Date   APPENDECTOMY     CORONARY/GRAFT ACUTE MI REVASCULARIZATION N/A 02/15/2023   Procedure: Coronary/Graft Acute MI Revascularization;  Surgeon: Iran Ouch, MD;  Location: [facility] INVASIVE CV LAB;  Service: Cardiovascular;  Laterality: N/A;   LEFT HEART CATH AND CORONARY ANGIOGRAPHY N/A 02/15/2023   Procedure: LEFT HEART CATH AND CORONARY ANGIOGRAPHY;  Surgeon: Iran Ouch, MD;  Location: [facility] INVASIVE CV LAB;  Service: Cardiovascular;  Laterality: N/A;   TONSILLECTOMY     TUBAL LIGATION       Allergies  Allergies  Allergen Reactions   Citalopram Hydrobromide Nausea And Vomiting   Codeine Diarrhea and Nausea And Vomiting   Penicillin G Benzathine     History of Present Illness    72 y/o ? w/ a h/o CAD s/p recent Anterior STEMI w/ LAD stenting and residual RCA dzs, ICM/HFpEF w/ EF 30-35%, HTN, HL, CVA, GERD, depression, anxiety, and osteoporosis.  She was recently admitted to [facility] On 02/15/2023 in the setting of progressive chest pain and anterior STEMI.  Cath  revealed a 90% stenosis in the mid LAD, which was successfully treated with a DES.  She has residual 80% stenosis in the proximal/mid RCA, w/ plan for staged PCI.  EF 30-35% by echo.  She was d/c'd on 8/4 on GDMT.  Following hospital discharge, Karina Freeman had been feeling well, though she has noted a runny nose w/o fever, cough, or congestion.  She notes compliance and tolerance w/ meds.  Karina Freeman was in her usoh until the evening of 8/7, when she was readying for bed around 11 PM, and had sudden onset of 8/10 substernal chest pressure and heaviness w/o associated symptoms or radiation.  She took her BP several times, and noted it trending in the 160's.  After about an hour of persistent symptoms, she called EMS.  Upon arrival, she was given sl ntg and 4 baby ASA w/o relief.  Upon arrival to the ED, she was afebrile and hypertensive @ 166/94.  H/H nl.  Creat mildly elevated above prior baseline @ 1.13 (f/u 1.07 this AM).  HsTrop initially 111 w/ f/u hsTrop of 90.  Chest pain resolved earlier this AM around 6 AM.  Currently symptom free.  Inpatient Medications     aspirin  81 mg Oral Daily   atorvastatin  80 mg Oral Daily   carvedilol  12.5 mg Oral BID WC   enoxaparin (LOVENOX) injection  40 mg Subcutaneous Q24H   losartan  25 mg Oral Daily   ticagrelor  90 mg Oral BID    Family History    Family History  Problem Relation Age of Onset   Heart disease Mother    Cancer Mother        breast   Heart disease Father    Heart disease Brother    Bipolar disorder Brother    Heart disease Sister    Breast cancer Neg Hx    She indicated that her mother is deceased. She indicated that her father is deceased. She indicated that all of her four sisters are alive. She indicated that her brother is alive. She indicated that her maternal grandmother is deceased. She indicated that her maternal grandfather is deceased. She indicated that her paternal grandmother is deceased. She indicated that her paternal  grandfather is deceased. She indicated that her daughter is alive. She indicated that both of her sons are alive. She indicated that the status of her neg hx is unknown.   Social History    Social History   Socioeconomic History   Marital status: Divorced    Spouse name: Not on file   Number of children: Not on file   Years of education: Not on file   Highest education level: Not on file  Occupational History   Not on file  Tobacco Use   Smoking status: Former    Current packs/day: 0.00    Types: Cigarettes    Quit date: 07/16/1973    Years since quitting: 49.6   Smokeless tobacco: Never  Substance and Sexual Activity   Alcohol use: No    Alcohol/week: 0.0 standard drinks of alcohol   Drug use: No   Sexual activity: Yes  Other Topics Concern   Not on file  Social History Narrative   Not on file   Social Determinants of Health   Financial Resource Strain: Not on file  Food Insecurity: No Food Insecurity (02/21/2023)   Hunger Vital Sign    Worried About Running Out of Food in the Last Year: Never true    Ran Out of Food in the Last Year: Never true  Transportation Needs: No Transportation Needs (02/21/2023)   PRAPARE - Administrator, Civil Service (Medical): No    Lack of Transportation (Non-Medical): No  Physical Activity: Not on file  Stress: Not on file  Social Connections: Not on file  Intimate Partner Violence: Not At Risk (02/21/2023)   Humiliation, Afraid, Rape, and Kick questionnaire    Fear of Current or Ex-Partner: No    Emotionally Abused: No    Physically Abused: No    Sexually Abused: No     Review of Systems    General:  No chills, fever, night sweats or weight changes.  Cardiovascular:  +++ chest pain/pressure/tightness, no dyspnea on exertion, edema, orthopnea, palpitations, paroxysmal nocturnal dyspnea. Dermatological: No rash, lesions/masses Respiratory: No cough, dyspnea Urologic: No hematuria, dysuria Abdominal:   No nausea, vomiting,  diarrhea, bright red blood per rectum, melena, or hematemesis Neurologic:  No visual changes, wkns, changes in mental status. All other systems reviewed and are otherwise negative except as noted above.  Physical Exam    Blood pressure (!) 159/74, pulse 67, temperature 98.7 F (37.1 C), temperature source Oral, resp. rate 15, height 5' 7.5" (1.715 m), weight 73.9 kg, SpO2 99%.  General: Pleasant, NAD Psych: Normal affect. Neuro: Alert and oriented X 3. Moves all extremities spontaneously. HEENT: Normal  Neck: Supple without bruits or JVD. Lungs:  Resp regular and unlabored, CTA. Heart: RRR  no s3, s4, or murmurs. Abdomen: Soft, non-tender, non-distended, BS + x 4.  Extremities: No clubbing, cyanosis or edema. DP/PT2+, Radials 2+ and equal bilaterally.  Prior R radial cath site w/o bleeding/bruit/hematoma.  Labs    Cardiac Enzymes Recent Labs  Lab 02/15/23 0700 02/15/23 1011 02/16/23 0918 02/21/23 0012 02/21/23 0220  TROPONINIHS 201* 1,235* 1,092* 111* 90*      Lab Results  Component Value Date   WBC 8.6 02/21/2023   HGB 12.1 02/21/2023   HCT 37.2 02/21/2023   MCV 93.9 02/21/2023   PLT 290 02/21/2023    Recent Labs  Lab 02/15/23 0700 02/16/23 0351 02/21/23 0220  NA 138   < > 135  K 3.1*   < > 3.5  CL 104   < > 110  CO2 20*   < > 22  BUN 15   < > 16  CREATININE 0.91   < > 1.07*  CALCIUM 8.9   < > 7.6*  PROT 7.8  --   --   BILITOT 0.7  --   --   ALKPHOS 59  --   --   ALT 12  --   --   AST 21  --   --   GLUCOSE 115*   < > 97   < > = values in this interval not displayed.   Lab Results  Component Value Date   CHOL 241 (H) 02/15/2023   HDL 60 02/15/2023   LDLCALC 164 (H) 02/15/2023   TRIG 84 02/15/2023    Radiology Studies    DG Chest Port 1 View  Result Date: 02/21/2023 CLINICAL DATA:  Chest pain EXAM: PORTABLE CHEST 1 VIEW COMPARISON:  07/14/2013 FINDINGS: The heart size and mediastinal contours are within normal limits. Both lungs are clear. The  visualized skeletal structures are unremarkable. IMPRESSION: No active disease. Electronically Signed   By: Deatra Robinson M.D.   On: 02/21/2023 00:30   ECG & Cardiac Imaging    RSR, 67, anterolateral TWI, subtle inf ST elev - personally reviewed.  Assessment & Plan    1.  Unstable Angina/CAD:  recent anterior STEMI w/PCI/DES to the mid LAD.  Known residual RCA dzs w/ plan for staged intervention.  D/c'd 8/4, and had been doing well, but developed chest pressure/tightness on the evening of 8/7, prompting EMS/ED eval.  Symptoms did not initially improve w/ ASA/NTG, but eventually abated after ~ 7 hrs.  Currently symptom free.  Initial hsTrop mildly elevated @ 111 w/ subsequent value of 90.  Suspect hsTrops down-trending from prior event.  With recent LAD stenting, known residual RCA dzs, and recurrent symptoms, will plan relook cath today and likely PCI of the RCA.  The patient understands that risks include but are not limited to stroke (1 in 1000), death (1 in 1000), kidney failure [usually temporary] (1 in 500), bleeding (1 in 200), allergic reaction [possibly serious] (1 in 200), and agrees to proceed.  Cont asa, brilinta, ? blocker, ARB, and high potency statin rx.  2.  Chronic HFrEF/ICM:  Ef 30-35% by echo following anterior MI earlier in the week.  Euvolemic on exam.  Cont ? blocker and ARB.  3.  Primary HTN:  BP elevated in ED.  F/u after AM meds and adjust as necessary.  4.  HL:  LDL 164 on 8/2 - statin naive @ that time.  Cont high potency statin rx.   Risk Assessment/Risk Scores:     TIMI Risk Score for Unstable Angina or Non-ST  Elevation MI:   The patient's TIMI risk score is  , which indicates a  % risk of all cause mortality, new or recurrent myocardial infarction or need for urgent revascularization in the next 14 days.  New York Heart Association (NYHA) Functional Class NYHA Class II    Signed, Nicolasa Ducking, NP 02/21/2023, 8:40 AM  For questions or updates, please  contact   Please consult [email or web address] for contact info under Cardiology/STEMI.

## 2023-02-21 NOTE — Progress Notes (Signed)
Transition of Care [facility]) - Inpatient Brief Assessment   Patient Details  Name: Karina Freeman MRN: [id number] Date of Birth: 12-22-1950  Transition of Care [facility]) CM/SW Contact:    Truddie Hidden, RN Phone Number: 02/21/2023, 2:52 PM   Clinical Narrative: TOC continues ongoing assessments for needs and changes in discharge plan.   Transition of Care Asessment: Insurance and Status: Insurance coverage has been reviewed Patient has primary care physician: Yes Home environment has been reviewed: Return to home Prior level of function:: Independent Prior/Current Home Services: No current home services Social Determinants of Health Reivew: SDOH reviewed no interventions necessary Readmission risk has been reviewed: Yes Transition of care needs: no transition of care needs at this time

## 2023-02-21 NOTE — ED Provider Notes (Signed)
[facility] Provider Note    None    (approximate)   History   Chest Pain   HPI  Karina Freeman is a 72 y.o. female   Past medical history of recent STEMI earlier this month with a PCI to LAD and also noted to have 80% occlusion of RCA plan for staged intervention in a couple of weeks who presents to the emergency department with chest pressure starting the tonight.  She was discharged several days ago and was chest pain-free feeling well no recent illnesses when she developed chest pressure around bedtime tonight while at rest watching TV.  She denies any respiratory symptoms like shortness of breath cough.  She denies any radiation of her chest pressure.  EMS gave her 3 nitroglycerin.  Been compliant with her medications including Brilinta and aspirin.   External Medical Documents Reviewed: Discharge summary from earlier this month when she was admitted for STEMI      Physical Exam   Triage Vital Signs: ED Triage Vitals [02/21/23 0018]  Encounter Vitals Group     BP (!) 166/94     Systolic BP Percentile      Diastolic BP Percentile      Pulse Rate 66     Resp 15     Temp 98.7 F (37.1 C)     Temp Source Oral     SpO2 99 %     Weight      Height      Head Circumference      Peak Flow      Pain Score      Pain Loc      Pain Education      Exclude from Growth Chart     Most recent vital signs: Vitals:   02/21/23 0018 02/21/23 0053  BP: (!) 166/94   Pulse: 66 62  Resp: 15 14  Temp: 98.7 F (37.1 C)   SpO2: 99% 98%    General: Awake, no distress.  CV:  Good peripheral perfusion.  Resp:  Normal effort.  Abd:  No distention.  Other:  Awake alert comfortable hypertensive otherwise vital signs normal lungs clear soft nontender abdomen.  Radial pulses intact equal bilaterally she appears euvolemic   ED Results / Procedures / Treatments   Labs (all labs ordered are listed, but only abnormal results are displayed) Labs  Reviewed  BASIC METABOLIC PANEL - Abnormal; Notable for the following components:      Result Value   Sodium 134 (*)    Glucose, Bld 112 (*)    Creatinine, Ser 1.13 (*)    Calcium 8.4 (*)    GFR, Estimated 52 (*)    All other components within normal limits  TROPONIN I (HIGH SENSITIVITY) - Abnormal; Notable for the following components:   Troponin I (High Sensitivity) 111 (*)    All other components within normal limits  CBC  BRAIN NATRIURETIC PEPTIDE     I ordered and reviewed the above labs they are notable for troponins 111 compared to 1000 from prior admission  EKG  ED ECG REPORT I, Pilar Jarvis, the attending physician, personally viewed and interpreted this ECG.   Date: 02/21/2023  EKG Time: 0014  Rate: 67  Rhythm: nsr  Axis: nl  Intervals:none  ST&T Change: no stemi    RADIOLOGY I independently reviewed and interpreted chest x-ray and I see no obvious focality or pneumothorax I also reviewed radiologist's formal read.   PROCEDURES:  Critical Care  performed: No  Procedures   MEDICATIONS ORDERED IN ED: Medications  aspirin chewable tablet 324 mg (324 mg Oral Given 02/21/23 0026)    External physician / consultants:  I spoke with hospitalist for  admission and regarding care plan for this patient.   IMPRESSION / MDM / ASSESSMENT AND PLAN / ED COURSE  I reviewed the triage vital signs and the nursing notes.                                Patient's presentation is most consistent with acute presentation with potential threat to life or bodily function.  Differential diagnosis includes, but is not limited to, ACS, PE, dissection, costochondritis or musculoskeletal pain, pneumothorax, respiratory infection   The patient is on the cardiac monitor to evaluate for evidence of arrhythmia and/or significant heart rate changes.  MDM: Patient with high risk chest pain recent STEMI with known RCA 80% occlusion plan for staged interventions, here with chest  pressure.  Given nitroglycerin by EMS and aspirin by me in the emergency department.  No STEMI on EKG.  Initial troponin 111 compared to 1000 and her prior admission.  Given her ongoing symptoms and high risk chest pain, admit.  I considered but doubt PE, dissection, given quality of pain no associated shortness of breath      FINAL CLINICAL IMPRESSION(S) / ED DIAGNOSES   Final diagnoses:  Nonspecific chest pain     Rx / DC Orders   ED Discharge Orders     None        Note:  This document was prepared using Dragon voice recognition software and may include unintentional dictation errors.    Pilar Jarvis, MD 02/21/23 [phone]

## 2023-02-21 NOTE — H&P (Signed)
[place]   PATIENT NAME: Karina Freeman    MR#:  [id number]  DATE OF BIRTH:  1950/08/14  DATE OF ADMISSION:  02/21/2023  PRIMARY CARE PHYSICIAN: Reubin Milan, MD   Patient is coming from: Home  REQUESTING/REFERRING PHYSICIAN: Pilar Jarvis, MD CHIEF COMPLAINT:   Chief Complaint  Patient presents with   Chest Pain    HISTORY OF PRESENT ILLNESS:  Karina Freeman is a 72 y.o. Caucasian female with medical history significant for essential hypertension, dyslipidemia, GERD, depression and anxiety, osteoporosis and CVA, who presented to the emergency room with acute onset of midsternal chest pressure, graded 5-6/10 in severity, that started around 10 PM with no dyspnea or palpitations.  She had no associated nausea or vomiting or diaphoresis.  The patient had a STEMI earlier this month for which she was admitted here and underwent PCI and LAD stent by Dr. Kirke Corin.  No cough or wheezing or hemoptysis.  No leg pain or edema recent travels or surgeries.  No bleeding diathesis.  No fever chills.  No dysuria, oliguria or hematuria or flank pain.  ED Course: When the patient came to the ER, BP was 166/94 with otherwise normal vital signs.  Labs revealed hyponatremia 134 and a creatinine 1.13 with calcium of 8.4 and high sensitive troponin 911 down from 1092 on 8/3.  CBC with normal. EKG as reviewed by me : EKG showed normal sinus rhythm with a rate of 67 with T wave inversion anteroseptally. Imaging: Portable chest ray showed no acute cardiopulmonary disease.  The patient was given for review aspirin.  She will be admitted to the observation cardiac telemetry bed for further evaluation and management. PAST MEDICAL HISTORY:   Past Medical History:  Diagnosis Date   Abnormal Pap smear of cervix 11/11/2015   Anxiety    CVA (cerebral infarction)    Depressive disorder    GERD (gastroesophageal reflux disease)    Hyperlipidemia    Hypertension    IFG (impaired fasting glucose)     Osteoporosis     PAST SURGICAL HISTORY:   Past Surgical History:  Procedure Laterality Date   APPENDECTOMY     CORONARY/GRAFT ACUTE MI REVASCULARIZATION N/A 02/15/2023   Procedure: Coronary/Graft Acute MI Revascularization;  Surgeon: Iran Ouch, MD;  Location: [facility] INVASIVE CV LAB;  Service: Cardiovascular;  Laterality: N/A;   LEFT HEART CATH AND CORONARY ANGIOGRAPHY N/A 02/15/2023   Procedure: LEFT HEART CATH AND CORONARY ANGIOGRAPHY;  Surgeon: Iran Ouch, MD;  Location: [facility] INVASIVE CV LAB;  Service: Cardiovascular;  Laterality: N/A;   TONSILLECTOMY     TUBAL LIGATION      SOCIAL HISTORY:   Social History   Tobacco Use   Smoking status: Former    Current packs/day: 0.00    Types: Cigarettes    Quit date: 07/16/1973    Years since quitting: 49.6   Smokeless tobacco: Never  Substance Use Topics   Alcohol use: No    Alcohol/week: 0.0 standard drinks of alcohol    FAMILY HISTORY:   Family History  Problem Relation Age of Onset   Heart disease Mother    Cancer Mother        breast   Heart disease Father    Heart disease Brother    Bipolar disorder Brother    Heart disease Sister    Breast cancer Neg Hx     DRUG ALLERGIES:   Allergies  Allergen Reactions   Citalopram Hydrobromide Nausea And Vomiting  Codeine Diarrhea and Nausea And Vomiting   Penicillin G Benzathine     REVIEW OF SYSTEMS:   ROS As per history of present illness. All pertinent systems were reviewed above. Constitutional, HEENT, cardiovascular, respiratory, GI, GU, musculoskeletal, neuro, psychiatric, endocrine, integumentary and hematologic systems were reviewed and are otherwise negative/unremarkable except for positive findings mentioned above in the HPI.   MEDICATIONS AT HOME:   Prior to Admission medications   Medication Sig Start Date End Date Taking? Authorizing Provider  aspirin 81 MG chewable tablet Chew 1 tablet (81 mg total) by mouth daily. 02/18/23 03/20/23  Delfino Lovett, MD   atorvastatin (LIPITOR) 80 MG tablet Take 1 tablet (80 mg total) by mouth daily. 02/15/23 02/15/24  Iran Ouch, MD  carvedilol (COREG) 12.5 MG tablet Take 1 tablet (12.5 mg total) by mouth 2 (two) times daily with a meal. 02/17/23 03/19/23  Delfino Lovett, MD  losartan (COZAAR) 25 MG tablet Take 1 tablet (25 mg total) by mouth daily. 02/18/23 03/20/23  Delfino Lovett, MD  ticagrelor (BRILINTA) 90 MG TABS tablet Take 1 tablet (90 mg total) by mouth 2 (two) times daily. 02/15/23   Iran Ouch, MD      VITAL SIGNS:  Blood pressure (!) 166/94, pulse 62, temperature 98.7 F (37.1 C), temperature source Oral, resp. rate 14, height 5' 7.5" (1.715 m), weight 73.9 kg, SpO2 98%.  PHYSICAL EXAMINATION:  Physical Exam  GENERAL:  72 y.o.-year-old Caucasian female patient lying in the bed with no acute distress.  EYES: Pupils equal, round, reactive to light and accommodation. No scleral icterus. Extraocular muscles intact.  HEENT: Head atraumatic, normocephalic. Oropharynx and nasopharynx clear.  NECK:  Supple, no jugular venous distention. No thyroid enlargement, no tenderness.  LUNGS: Normal breath sounds bilaterally, no wheezing, rales,rhonchi or crepitation. No use of accessory muscles of respiration.  CARDIOVASCULAR: Regular rate and rhythm, S1, S2 normal. No murmurs, rubs, or gallops.  ABDOMEN: Soft, nondistended, nontender. Bowel sounds present. No organomegaly or mass.  EXTREMITIES: No pedal edema, cyanosis, or clubbing.  NEUROLOGIC: Cranial nerves II through XII are intact. Muscle strength 5/5 in all extremities. Sensation intact. Gait not checked.  PSYCHIATRIC: The patient is alert and oriented x 3.  Normal affect and good eye contact. SKIN: No obvious rash, lesion, or ulcer.   LABORATORY PANEL:   CBC Recent Labs  Lab 02/21/23 0012  WBC 8.6  HGB 12.1  HCT 37.2  PLT 290    ------------------------------------------------------------------------------------------------------------------  Chemistries  Recent Labs  Lab 02/15/23 0700 02/16/23 0351 02/17/23 0402 02/21/23 0012  NA 138 141   < > 134*  K 3.1* 3.6   < > 3.8  CL 104 106   < > 106  CO2 20* 25   < > 23  GLUCOSE 115* 95   < > 112*  BUN 15 13   < > 18  CREATININE 0.91 0.87   < > 1.13*  CALCIUM 8.9 8.7*   < > 8.4*  MG  --  2.0  --   --   AST 21  --   --   --   ALT 12  --   --   --   ALKPHOS 59  --   --   --   BILITOT 0.7  --   --   --    < > = values in this interval not displayed.   ------------------------------------------------------------------------------------------------------------------  Cardiac Enzymes No results for input(s): "TROPONINI" in the last 168 hours. ------------------------------------------------------------------------------------------------------------------  RADIOLOGY:  DG Chest Port 1 View  Result Date: 02/21/2023 CLINICAL DATA:  Chest pain EXAM: PORTABLE CHEST 1 VIEW COMPARISON:  07/14/2013 FINDINGS: The heart size and mediastinal contours are within normal limits. Both lungs are clear. The visualized skeletal structures are unremarkable. IMPRESSION: No active disease. Electronically Signed   By: Deatra Robinson M.D.   On: 02/21/2023 00:30      IMPRESSION AND PLAN:  Assessment and Plan: * Chest pain - The patient will be admitted to an observation cardiac telemetry bed. - Will follow serial troponins and EKGs. - The patient will be placed on aspirin as well as p.r.n. sublingual nitroglycerin and morphine sulfate for pain.  Will resume Brilinta as well. - Will continue high-dose statin therapy as well as beta-blocker therapy. - Will check fasting lipids. - We will obtain a cardiology consult in a.m. for further cardiac risk stratification. - I notified [facility] group about the patient.   Essential hypertension - Will continue her  antihypertensives.  Dyslipidemia - Will continue high-dose statin therapy as mentioned above.   DVT prophylaxis: Lovenox. Advanced Care Planning:  Code Status: full code. Family Communication:  The plan of care was discussed in details with the patient (and family). I answered all questions. The patient agreed to proceed with the above mentioned plan. Further management will depend upon hospital course. Disposition Plan: Back to previous home environment Consults called: Cardiology. All the records are reviewed and case discussed with ED provider.  Status is: Observation  I certify that at the time of admission, it is my clinical judgment that the patient will require hospital care extending less than 2 midnights.                            Dispo: The patient is from: Home              Anticipated d/c is to: Home              Patient currently is not medically stable to d/c.              Difficult to place patient: No  Hannah Beat M.D on 02/21/2023 at 1:49 AM  [facility]   From 7 PM-7 AM, contact night-coverage [email or web address]  CC: Primary care physician; Reubin Milan, MD

## 2023-02-21 NOTE — Assessment & Plan Note (Signed)
-   Will continue high-dose statin therapy as mentioned above.

## 2023-02-21 NOTE — Progress Notes (Signed)
No charge note  Patient with a history of coronary artery disease status post recent anterior wall ST elevation MI status post DES to mid LAD with 80% blockage of the RCA.  Plan was for staged procedure to the RCA as an outpatient in about 2 weeks. Patient presents to the ER for evaluation of chest pressure which started at rest.  Initial troponin was 111 but shows a downward trend.  Plan is for a left heart cath today for further evaluation Continue aspirin, Brilinta, carvedilol, losartan and atorvastatin

## 2023-02-21 NOTE — ED Triage Notes (Signed)
Pt BIB [facility] for 8/10 mid sternal CP, given 3 nitroglycerin sprays by EMS, 0/10 pain on arrival, but reports "pressure." Hx HTN, compliant with meds per report. No ASA given by EMS. MI last Friday, stent placed.

## 2023-02-21 NOTE — Assessment & Plan Note (Addendum)
-   The patient will be admitted to an observation cardiac telemetry bed. - Will follow serial troponins and EKGs. - The patient will be placed on aspirin as well as p.r.n. sublingual nitroglycerin and morphine sulfate for pain.  Will resume Brilinta as well. - Will continue high-dose statin therapy as well as beta-blocker therapy. - Will check fasting lipids. - We will obtain a cardiology consult in a.m. for further cardiac risk stratification. - I notified [facility] group about the patient.

## 2023-02-21 NOTE — Interval H&P Note (Signed)
History and Physical Interval Note:  02/21/2023 2:06 PM  Karina Freeman  has presented today for surgery, with the diagnosis of unstable angina/CAD.  The various methods of treatment have been discussed with the patient and family. After consideration of risks, benefits and other options for treatment, the patient has consented to  Procedure(s): LEFT HEART CATH AND CORONARY ANGIOGRAPHY (N/A) as a surgical intervention.  The patient's history has been reviewed, patient examined, no change in status, stable for surgery.  I have reviewed the patient's chart and labs.  Questions were answered to the patient's satisfaction.    Cath Lab Visit (complete for each Cath Lab visit)  Clinical Evaluation Leading to the Procedure:   ACS: Yes.    Non-ACS:  N/A   

## 2023-02-21 NOTE — Consult Note (Signed)
Cardiology Consult    Patient ID: Karina Freeman MRN: [id number], DOB/AGE: Apr 07, 1951   Admit date: 02/21/2023 Date of Consult: 02/21/2023  Primary Physician: Reubin Milan, MD Primary Cardiologist: Lorine Bears, MD Requesting Provider: Arvid Right, MD  Patient Profile    Karina Freeman is a 72 y.o. 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 Joylene Igo.  Past Medical History   Past Medical History:  Diagnosis Date   Abnormal Pap smear of cervix 11/11/2015   Anxiety    CVA (cerebral infarction)    Depressive disorder    GERD (gastroesophageal reflux disease)    Hyperlipidemia    Hypertension    IFG (impaired fasting glucose)    Osteoporosis     Past Surgical History:  Procedure Laterality Date   APPENDECTOMY     CORONARY/GRAFT ACUTE MI REVASCULARIZATION N/A 02/15/2023   Procedure: Coronary/Graft Acute MI Revascularization;  Surgeon: Iran Ouch, MD;  Location: [facility] INVASIVE CV LAB;  Service: Cardiovascular;  Laterality: N/A;   LEFT HEART CATH AND CORONARY ANGIOGRAPHY N/A 02/15/2023   Procedure: LEFT HEART CATH AND CORONARY ANGIOGRAPHY;  Surgeon: Iran Ouch, MD;  Location: [facility] INVASIVE CV LAB;  Service: Cardiovascular;  Laterality: N/A;   TONSILLECTOMY     TUBAL LIGATION       Allergies  Allergies  Allergen Reactions   Citalopram Hydrobromide Nausea And Vomiting   Codeine Diarrhea and Nausea And Vomiting   Penicillin G Benzathine     History of Present Illness    72 y/o ? w/ a h/o CAD s/p recent Anterior STEMI w/ LAD stenting and residual RCA dzs, ICM/HFpEF w/ EF 30-35%, HTN, HL, CVA, GERD, depression, anxiety, and osteoporosis.  She was recently admitted to [facility] On 02/15/2023 in the setting of progressive chest pain and anterior STEMI.  Cath  revealed a 90% stenosis in the mid LAD, which was successfully treated with a DES.  She has residual 80% stenosis in the proximal/mid RCA, w/ plan for staged PCI.  EF 30-35% by echo.  She was d/c'd on 8/4 on GDMT.  Following hospital discharge, Karina Freeman had been feeling well, though she has noted a runny nose w/o fever, cough, or congestion.  She notes compliance and tolerance w/ meds.  Karina Freeman was in her usoh until the evening of 8/7, when she was readying for bed around 11 PM, and had sudden onset of 8/10 substernal chest pressure and heaviness w/o associated symptoms or radiation.  She took her BP several times, and noted it trending in the 160's.  After about an hour of persistent symptoms, she called EMS.  Upon arrival, she was given sl ntg and 4 baby ASA w/o relief.  Upon arrival to the ED, she was afebrile and hypertensive @ 166/94.  H/H nl.  Creat mildly elevated above prior baseline @ 1.13 (f/u 1.07 this AM).  HsTrop initially 111 w/ f/u hsTrop of 90.  Chest pain resolved earlier this AM around 6 AM.  Currently symptom free.  Inpatient Medications     aspirin  81 mg Oral Daily   atorvastatin  80 mg Oral Daily   carvedilol  12.5 mg Oral BID WC   enoxaparin (LOVENOX) injection  40 mg Subcutaneous Q24H   losartan  25 mg Oral Daily   ticagrelor  90 mg Oral BID    Family History    Family History  Problem Relation Age of Onset   Heart disease Mother    Cancer Mother        breast   Heart disease Father    Heart disease Brother    Bipolar disorder Brother    Heart disease Sister    Breast cancer Neg Hx    She indicated that her mother is deceased. She indicated that her father is deceased. She indicated that all of her four sisters are alive. She indicated that her brother is alive. She indicated that her maternal grandmother is deceased. She indicated that her maternal grandfather is deceased. She indicated that her paternal grandmother is deceased. She indicated that her paternal  grandfather is deceased. She indicated that her daughter is alive. She indicated that both of her sons are alive. She indicated that the status of her neg hx is unknown.   Social History    Social History   Socioeconomic History   Marital status: Divorced    Spouse name: Not on file   Number of children: Not on file   Years of education: Not on file   Highest education level: Not on file  Occupational History   Not on file  Tobacco Use   Smoking status: Former    Current packs/day: 0.00    Types: Cigarettes    Quit date: 07/16/1973    Years since quitting: 49.6   Smokeless tobacco: Never  Substance and Sexual Activity   Alcohol use: No    Alcohol/week: 0.0 standard drinks of alcohol   Drug use: No   Sexual activity: Yes  Other Topics Concern   Not on file  Social History Narrative   Not on file   Social Determinants of Health   Financial Resource Strain: Not on file  Food Insecurity: No Food Insecurity (02/21/2023)   Hunger Vital Sign    Worried About Running Out of Food in the Last Year: Never true    Ran Out of Food in the Last Year: Never true  Transportation Needs: No Transportation Needs (02/21/2023)   PRAPARE - Administrator, Civil Service (Medical): No    Lack of Transportation (Non-Medical): No  Physical Activity: Not on file  Stress: Not on file  Social Connections: Not on file  Intimate Partner Violence: Not At Risk (02/21/2023)   Humiliation, Afraid, Rape, and Kick questionnaire    Fear of Current or Ex-Partner: No    Emotionally Abused: No    Physically Abused: No    Sexually Abused: No     Review of Systems    General:  No chills, fever, night sweats or weight changes.  Cardiovascular:  +++ chest pain/pressure/tightness, no dyspnea on exertion, edema, orthopnea, palpitations, paroxysmal nocturnal dyspnea. Dermatological: No rash, lesions/masses Respiratory: No cough, dyspnea Urologic: No hematuria, dysuria Abdominal:   No nausea, vomiting,  diarrhea, bright red blood per rectum, melena, or hematemesis Neurologic:  No visual changes, wkns, changes in mental status. All other systems reviewed and are otherwise negative except as noted above.  Physical Exam    Blood pressure (!) 159/74, pulse 67, temperature 98.7 F (37.1 C), temperature source Oral, resp. rate 15, height 5' 7.5" (1.715 m), weight 73.9 kg, SpO2 99%.  General: Pleasant, NAD Psych: Normal affect. Neuro: Alert and oriented X 3. Moves all extremities spontaneously. HEENT: Normal  Neck: Supple without bruits or JVD. Lungs:  Resp regular and unlabored, CTA. Heart: RRR  no s3, s4, or murmurs. Abdomen: Soft, non-tender, non-distended, BS + x 4.  Extremities: No clubbing, cyanosis or edema. DP/PT2+, Radials 2+ and equal bilaterally.  Prior R radial cath site w/o bleeding/bruit/hematoma.  Labs    Cardiac Enzymes Recent Labs  Lab 02/15/23 0700 02/15/23 1011 02/16/23 0918 02/21/23 0012 02/21/23 0220  TROPONINIHS 201* 1,235* 1,092* 111* 90*      Lab Results  Component Value Date   WBC 8.6 02/21/2023   HGB 12.1 02/21/2023   HCT 37.2 02/21/2023   MCV 93.9 02/21/2023   PLT 290 02/21/2023    Recent Labs  Lab 02/15/23 0700 02/16/23 0351 02/21/23 0220  NA 138   < > 135  K 3.1*   < > 3.5  CL 104   < > 110  CO2 20*   < > 22  BUN 15   < > 16  CREATININE 0.91   < > 1.07*  CALCIUM 8.9   < > 7.6*  PROT 7.8  --   --   BILITOT 0.7  --   --   ALKPHOS 59  --   --   ALT 12  --   --   AST 21  --   --   GLUCOSE 115*   < > 97   < > = values in this interval not displayed.   Lab Results  Component Value Date   CHOL 241 (H) 02/15/2023   HDL 60 02/15/2023   LDLCALC 164 (H) 02/15/2023   TRIG 84 02/15/2023    Radiology Studies    DG Chest Port 1 View  Result Date: 02/21/2023 CLINICAL DATA:  Chest pain EXAM: PORTABLE CHEST 1 VIEW COMPARISON:  07/14/2013 FINDINGS: The heart size and mediastinal contours are within normal limits. Both lungs are clear. The  visualized skeletal structures are unremarkable. IMPRESSION: No active disease. Electronically Signed   By: Deatra Robinson M.D.   On: 02/21/2023 00:30   ECG & Cardiac Imaging    RSR, 67, anterolateral TWI, subtle inf ST elev - personally reviewed.  Assessment & Plan    1.  Unstable Angina/CAD:  recent anterior STEMI w/PCI/DES to the mid LAD.  Known residual RCA dzs w/ plan for staged intervention.  D/c'd 8/4, and had been doing well, but developed chest pressure/tightness on the evening of 8/7, prompting EMS/ED eval.  Symptoms did not initially improve w/ ASA/NTG, but eventually abated after ~ 7 hrs.  Currently symptom free.  Initial hsTrop mildly elevated @ 111 w/ subsequent value of 90.  Suspect hsTrops down-trending from prior event.  With recent LAD stenting, known residual RCA dzs, and recurrent symptoms, will plan relook cath today and likely PCI of the RCA.  The patient understands that risks include but are not limited to stroke (1 in 1000), death (1 in 1000), kidney failure [usually temporary] (1 in 500), bleeding (1 in 200), allergic reaction [possibly serious] (1 in 200), and agrees to proceed.  Cont asa, brilinta, ? blocker, ARB, and high potency statin rx.  2.  Chronic HFrEF/ICM:  Ef 30-35% by echo following anterior MI earlier in the week.  Euvolemic on exam.  Cont ? blocker and ARB.  3.  Primary HTN:  BP elevated in ED.  F/u after AM meds and adjust as necessary.  4.  HL:  LDL 164 on 8/2 - statin naive @ that time.  Cont high potency statin rx.   Risk Assessment/Risk Scores:     TIMI Risk Score for Unstable Angina or Non-ST  Elevation MI:   The patient's TIMI risk score is  , which indicates a  % risk of all cause mortality, new or recurrent myocardial infarction or need for urgent revascularization in the next 14 days.  New York Heart Association (NYHA) Functional Class NYHA Class II    Signed, Nicolasa Ducking, NP 02/21/2023, 8:40 AM  For questions or updates, please  contact   Please consult [email or web address] for contact info under Cardiology/STEMI.

## 2023-02-21 NOTE — Assessment & Plan Note (Signed)
Will continue her antihypertensives.

## 2023-02-22 ENCOUNTER — Encounter: Payer: Self-pay | Admitting: Internal Medicine

## 2023-02-22 ENCOUNTER — Telehealth: Payer: Self-pay

## 2023-02-22 ENCOUNTER — Ambulatory Visit: Payer: Medicare Other | Admitting: Internal Medicine

## 2023-02-22 ENCOUNTER — Other Ambulatory Visit: Payer: Self-pay | Admitting: Nurse Practitioner

## 2023-02-22 ENCOUNTER — Other Ambulatory Visit (HOSPITAL_COMMUNITY): Payer: Self-pay

## 2023-02-22 ENCOUNTER — Observation Stay (HOSPITAL_BASED_OUTPATIENT_CLINIC_OR_DEPARTMENT_OTHER)
Admit: 2023-02-22 | Discharge: 2023-02-22 | Disposition: A | Discharge status: Home / Self Care | Payer: Medicare Other | Attending: Internal Medicine | Admitting: Internal Medicine

## 2023-02-22 DIAGNOSIS — I25110 Atherosclerotic heart disease of native coronary artery with unstable angina pectoris: Secondary | ICD-10-CM | POA: Diagnosis not present

## 2023-02-22 DIAGNOSIS — R079 Chest pain, unspecified: Secondary | ICD-10-CM

## 2023-02-22 DIAGNOSIS — R9431 Abnormal electrocardiogram [ECG] [EKG]: Secondary | ICD-10-CM | POA: Diagnosis not present

## 2023-02-22 DIAGNOSIS — I5021 Acute systolic (congestive) heart failure: Secondary | ICD-10-CM | POA: Diagnosis not present

## 2023-02-22 DIAGNOSIS — I10 Essential (primary) hypertension: Secondary | ICD-10-CM

## 2023-02-22 HISTORY — DX: Acute systolic (congestive) heart failure: I50.21

## 2023-02-22 MED ORDER — NITROGLYCERIN 0.4 MG SL SUBL: 0.4000 mg | SUBLINGUAL_TABLET | SUBLINGUAL | 3 refills | Status: AC | PRN

## 2023-02-22 MED ORDER — SPIRONOLACTONE 25 MG PO TABS
25.0000 mg | ORAL_TABLET | Freq: Every day | ORAL | Status: DC
  Administered 2023-02-22: 25 mg via ORAL
  Filled 2023-02-22: qty 1

## 2023-02-22 MED ORDER — TICAGRELOR 90 MG PO TABS: 90.0000 mg | ORAL_TABLET | Freq: Two times a day (BID) | ORAL | 11 refills | Status: DC

## 2023-02-22 MED ORDER — SPIRONOLACTONE 25 MG PO TABS: 25.0000 mg | ORAL_TABLET | Freq: Every day | ORAL | 0 refills | Status: DC

## 2023-02-22 MED ORDER — LOSARTAN POTASSIUM 50 MG PO TABS
50.0000 mg | ORAL_TABLET | Freq: Every day | ORAL | Status: DC
  Administered 2023-02-22: 50 mg via ORAL
  Filled 2023-02-22: qty 1

## 2023-02-22 MED ORDER — HYDRALAZINE HCL 20 MG/ML IJ SOLN: 10.0000 mg | Freq: Four times a day (QID) | INTRAMUSCULAR | Status: DC | PRN

## 2023-02-22 MED ORDER — LOSARTAN POTASSIUM 50 MG PO TABS: 50.0000 mg | ORAL_TABLET | Freq: Every day | ORAL | 0 refills | Status: DC

## 2023-02-22 MED ORDER — TRAZODONE HCL 50 MG PO TABS: 25.0000 mg | ORAL_TABLET | Freq: Every evening | ORAL | 0 refills | Status: DC | PRN

## 2023-02-22 NOTE — Telephone Encounter (Signed)
Copied from CRM [id number]. Topic: Appointment Scheduling - Scheduling Inquiry for Clinic >> Feb 22, 2023 11:58 AM De Blanch wrote: Reason for CRM: Pt husband called to reschedule a new patient appointment for today and stated the patient was admitted to [facility] on 02/21/23 and is currently still there. Mentioned may be discharged today; however, pt asked if it was possible for her to be seen earlier than the next available in November. Advised I could not guarantee Scheduled for 11/27 and added to the wait list; however, husband insisted I ask PCP.  Please advise.

## 2023-02-22 NOTE — Discharge Summary (Signed)
Physician Discharge Summary   Patient: Karina Freeman MRN: [id number] DOB: [birth year]  Admit date:     02/21/2023  Discharge date: 02/22/23  Discharge Physician: Lynnea Ferrier A    Recommendations at discharge:  Follow up with Cardiology clinic on 03/07/23 (appointment requested)  Discharge Diagnoses: Active Problems:   Dyslipidemia   Essential hypertension   Coronary artery disease involving native coronary artery of native heart with unstable angina pectoris (HCC)  Principal Problem (Resolved):   Chest pain    HISTORY OF PRESENT ILLNESS:  Karina Freeman is a 72 y.o. Caucasian female with medical history significant for essential hypertension, dyslipidemia, GERD, depression and anxiety, osteoporosis and CVA, who presented to the emergency room with acute onset of midsternal chest pressure, graded 5-6/10 in severity, that started around 10 PM with no dyspnea or palpitations.  She had no associated nausea or vomiting or diaphoresis.  The patient had a STEMI earlier this month for which she was admitted here and underwent PCI and LAD stent by Dr. Kirke Corin.  No cough or wheezing or hemoptysis.  No leg pain or edema recent travels or surgeries.  No bleeding diathesis.  No fever chills.  No dysuria, oliguria or hematuria or flank pain. ED Course: When the patient came to the ER, BP was 166/94 with otherwise normal vital signs.  Labs revealed hyponatremia 134 and a creatinine 1.13 with calcium of 8.4 and high sensitive troponin 911 down from 1092 on 8/3.  CBC with normal. EKG as reviewed by me : EKG showed normal sinus rhythm with a rate of 67 with T wave inversion anteroseptally. Imaging: Portable chest ray showed no acute cardiopulmonary disease.  The patient was given for review aspirin.  She will be admitted to the observation cardiac telemetry bed for further evaluation and management.   Hospital Course: Patient has been admitted for chest pain work up under OBS status Chest pain with h/o  CAD s/p recent anterior wall ST elevation MI status post DES to mid LAD with 80% blockage of the RCA  Troponin 111--> 90 Echo results reviewed from 02/22/23: Left ventricular ejection fraction, by estimation, is 65 to 70%. The left ventricle has normal function. Right ventricular systolic function is normal. The right ventricular size is normal. The mitral valve is normal in structure. Mild mitral valve  regurgitation.  Continue aspirin, Brilinta , high-dose statin therapy, beta-blocker  Give p.r.n. sublingual nitroglycerin and morphine sulfate for pain. Cardiology recommended to discharge and follow up the pt as outpt. No interventions/stress testing needed at this time. Notified [facility] group about the patient.   Essential hypertension: Mildly uncontrolled Coreg, Losartan and Spironolactone as prescribed below F/u BP   Dyslipidemia: Recent lipid profile Chol 241, LDL 164, HDL 60, TGL 84 Continue high-dose statin therapy as mentioned above.     Disposition: Home Diet recommendation:  Discharge Diet Orders (From admission, onward)     Start     Ordered   02/22/23 0000  Diet - low sodium heart healthy        02/22/23 1237           Cardiac diet DISCHARGE MEDICATION: Allergies as of 02/22/2023       Reactions   Citalopram Hydrobromide Nausea And Vomiting   Codeine Diarrhea, Nausea And Vomiting   Penicillin G Benzathine         Medication List     TAKE these medications    aspirin 81 MG chewable tablet Chew 1 tablet (81 mg total) by mouth daily.  atorvastatin 80 MG tablet Commonly known as: Lipitor Take 1 tablet (80 mg total) by mouth daily.   carvedilol 12.5 MG tablet Commonly known as: COREG Take 1 tablet (12.5 mg total) by mouth 2 (two) times daily with a meal.   losartan 50 MG tablet Commonly known as: COZAAR Take 1 tablet (50 mg total) by mouth daily. Start taking on: February 23, 2023 What changed:  medication strength how much to take   nitroGLYCERIN  0.4 MG SL tablet Commonly known as: Nitrostat Place 1 tablet (0.4 mg total) under the tongue every 5 (five) minutes as needed for chest pain.   spironolactone 25 MG tablet Commonly known as: ALDACTONE Take 1 tablet (25 mg total) by mouth daily. Start taking on: February 23, 2023   ticagrelor 90 MG Tabs tablet Commonly known as: Brilinta Take 1 tablet (90 mg total) by mouth 2 (two) times daily.   traZODone 50 MG tablet Commonly known as: DESYREL Take 0.5 tablets (25 mg total) by mouth at bedtime as needed for sleep.        Follow-up Information     Charlsie Quest, NP Follow up on 03/07/2023.   Specialty: Cardiology Why: 10:55 AM Contact information: [address], [address] [place] [address] [phone]         Charlsie Quest, NP Follow up on 02/28/2023.   Specialty: Cardiology Why: *LAB APPOINTMENT ONLY - BLOOD CHEMISTRY - YOU MAY PRESENT TO [facility] ANY TIME BETWEEN 7:30A AND 4P FOR LAB. YOU MAY EAT. Contact information: [address] [place] [address] [phone]                Discharge Exam: [name] Weights   02/21/23 0020 02/21/23 0845  Weight: 73.9 kg 73.9 kg  Physical Exam Constitutional:      General: She is not in acute distress.    Appearance: She is well-developed.  HENT:     Head: Normocephalic and atraumatic.  Eyes:     Extraocular Movements: Extraocular movements intact.     Pupils: Pupils are equal, round, and reactive to light.  Cardiovascular:     Rate and Rhythm: Normal rate and regular rhythm.     Heart sounds: Normal heart sounds.  Pulmonary:     Effort: Pulmonary effort is normal.     Breath sounds: Normal breath sounds.  Chest:     Chest wall: No tenderness.  Abdominal:     General: Bowel sounds are normal.     Palpations: Abdomen is soft.  Musculoskeletal:        General: Normal range of motion.     Cervical back: Normal range of motion and neck supple.     Right lower leg: No  tenderness. No edema.     Left lower leg: No tenderness. No edema.  Skin:    General: Skin is warm.     Capillary Refill: Capillary refill takes 2 to 3 seconds.     Findings: No erythema.  Neurological:     General: No focal deficit present.     Mental Status: She is alert and oriented to person, place, and time.     Cranial Nerves: No cranial nerve deficit.     Motor: No weakness.  Psychiatric:        Mood and Affect: Mood normal.        Behavior: Behavior normal.       Condition at discharge: good  The results of significant diagnostics from this hospitalization (including  imaging, microbiology, ancillary and laboratory) are listed below for reference.   Imaging Studies: ECHOCARDIOGRAM LIMITED  Result Date: 02/22/2023    ECHOCARDIOGRAM LIMITED REPORT   Patient Name:   ARGIRO BOUNDY Date of Exam: 02/22/2023 Medical Rec #:  [id number]       Height:       67.5 in Accession #:    [id number]      Weight:       163.0 lb Date of Birth:  1950-10-07      BSA:          1.864 m Patient Age:    71 years        BP:           164/70 mmHg Patient Gender: F               HR:           71 bpm. Exam Location:  [facility] Procedure: Limited Echo, Color Doppler and Cardiac Doppler Indications:     Chest pain R07.9  History:         Patient has prior history of Echocardiogram examinations, most                  recent 02/15/2023. Risk Factors:Hypertension. CVA.  Sonographer:     Cristela Blue Referring Phys:  [id number] CHRISTOPHER END Diagnosing Phys: Debbe Odea MD IMPRESSIONS  1. Left ventricular ejection fraction, by estimation, is 65 to 70%. The left ventricle has normal function.  2. Right ventricular systolic function is normal. The right ventricular size is normal.  3. The mitral valve is normal in structure. Mild mitral valve regurgitation.  4. The inferior vena cava is normal in size with greater than 50% respiratory variability, suggesting right atrial pressure of 3 mmHg. FINDINGS  Left Ventricle: Left  ventricular ejection fraction, by estimation, is 65 to 70%. The left ventricle has normal function. The left ventricular internal cavity size was normal in size. There is no left ventricular hypertrophy. Right Ventricle: The right ventricular size is normal. No increase in right ventricular wall thickness. Right ventricular systolic function is normal. Left Atrium: Left atrial size was normal in size. Right Atrium: Right atrial size was normal in size. Pericardium: There is no evidence of pericardial effusion. Mitral Valve: The mitral valve is normal in structure. Mild mitral valve regurgitation. Venous: The inferior vena cava is normal in size with greater than 50% respiratory variability, suggesting right atrial pressure of 3 mmHg. Additional Comments: Spectral Doppler performed. Color Doppler performed.  LEFT VENTRICLE PLAX 2D LVIDd:         4.40 cm LVIDs:         2.90 cm LV PW:         0.90 cm LV IVS:        0.70 cm  Debbe Odea MD Electronically signed by Debbe Odea MD Signature Date/Time: 02/22/2023/12:03:43 PM    Final    CARDIAC CATHETERIZATION  Result Date: 02/21/2023 Conclusions: Multivessel coronary artery disease, as detailed below.  Mid/distal LAD, LCx, and RCA disease is similar to last catheterization.  Moderate to severe RCA disease (previously described as up to 80% stenosis, is not hemodynamically significant; RFR = 0.98). Focal dissection at proximal edge of recently placed proximal/mid LAD stent extending into the media. Normal left ventricular filling pressure (LVEDP 10 mmHg). Successful OCT-guided PCI to proximal LAD edge-dissection using Onyx Frontier 3.0 x 8 mm drug-eluting stent (overlaps with previously placed stent) with 0% residual stenosis and TIMI-3  flow. Recommendations: Continue dual antiplatelet therapy with aspirin and ticagrelor for at least 12 months. Aggressive secondary prevention of coronary artery disease.  Defer staged PCI to RCA given reassuring functional  assessment (RFR = 0.98). Repeat echo to ensure LVEF has not dropped further. Yvonne Kendall, MD [facility]  DG Chest Port 1 View  Result Date: 02/21/2023 CLINICAL DATA:  Chest pain EXAM: PORTABLE CHEST 1 VIEW COMPARISON:  07/14/2013 FINDINGS: The heart size and mediastinal contours are within normal limits. Both lungs are clear. The visualized skeletal structures are unremarkable. IMPRESSION: No active disease. Electronically Signed   By: Deatra Robinson M.D.   On: 02/21/2023 00:30   ECHOCARDIOGRAM COMPLETE  Result Date: 02/15/2023    ECHOCARDIOGRAM REPORT   Patient Name:   EMAHNI MEINHART Date of Exam: 02/15/2023 Medical Rec #:  [id number]       Height:       67.5 in Accession #:    [id number]      Weight:       163.4 lb Date of Birth:  July 01, 1951      BSA:          1.866 m Patient Age:    71 years        BP:           158/88 mmHg Patient Gender: F               HR:           69 bpm. Exam Location:  [facility] Procedure: 2D Echo, Cardiac Doppler and Color Doppler Indications:     Acute myocardial infarction---unspecified I21.9  History:         Patient has no prior history of Echocardiogram examinations.                  Risk Factors:Hypertension and Dyslipidemia.  Sonographer:     Cristela Blue Referring Phys:  [id number] AOZHYQMV A ARIDA Diagnosing Phys: Debbe Odea MD IMPRESSIONS  1. Left ventricular ejection fraction, by estimation, is 30 to 35%. Left ventricular ejection fraction by 2D MOD biplane is 33.8 %. The left ventricle has moderate to severely decreased function. The left ventricle demonstrates regional wall motion abnormalities (see scoring diagram/findings for description). There is mild left ventricular hypertrophy. Left ventricular diastolic parameters are consistent with Grade I diastolic dysfunction (impaired relaxation). There is akinesis of the left ventricular, entire anteroseptal wall. There is akinesis of the left ventricular, mid-apical anterior segment.  2. Right ventricular systolic  function is normal. The right ventricular size is normal.  3. The mitral valve is normal in structure. Mild mitral valve regurgitation.  4. The aortic valve is calcified. Aortic valve regurgitation is not visualized. FINDINGS  Left Ventricle: Left ventricular ejection fraction, by estimation, is 30 to 35%. Left ventricular ejection fraction by 2D MOD biplane is 33.8 %. The left ventricle has moderate to severely decreased function. The left ventricle demonstrates regional wall motion abnormalities. The left ventricular internal cavity size was normal in size. There is mild left ventricular hypertrophy. Left ventricular diastolic parameters are consistent with Grade I diastolic dysfunction (impaired relaxation). Right Ventricle: The right ventricular size is normal. No increase in right ventricular wall thickness. Right ventricular systolic function is normal. Left Atrium: Left atrial size was normal in size. Right Atrium: Right atrial size was normal in size. Pericardium: There is no evidence of pericardial effusion. Mitral Valve: The mitral valve is normal in structure. Mild mitral valve regurgitation. Tricuspid Valve: The tricuspid valve is  normal in structure. Tricuspid valve regurgitation is mild. Aortic Valve: The aortic valve is calcified. Aortic valve regurgitation is not visualized. Aortic valve mean gradient measures 2.0 mmHg. Aortic valve peak gradient measures 3.0 mmHg. Aortic valve area, by VTI measures 3.56 cm. Pulmonic Valve: The pulmonic valve was not well visualized. Pulmonic valve regurgitation is not visualized. Aorta: The aortic root is normal in size and structure. Venous: The inferior vena cava was not well visualized. IAS/Shunts: No atrial level shunt detected by color flow Doppler.  LEFT VENTRICLE PLAX 2D                        Biplane EF (MOD) LVIDd:         4.00 cm         LV Biplane EF:   Left LVIDs:         2.00 cm                          ventricular LV PW:         1.10 cm                           ejection LV IVS:        1.10 cm                          fraction by LVOT diam:     2.00 cm                          2D MOD LV SV:         50                               biplane is LV SV Index:   27                               33.8 %. LVOT Area:     3.14 cm                                Diastology                                LV e' medial:    5.55 cm/s LV Volumes (MOD)               LV E/e' medial:  10.2 LV vol d, MOD    71.8 ml       LV e' lateral:   5.55 cm/s A2C:                           LV E/e' lateral: 10.2 LV vol d, MOD    69.6 ml A4C: LV vol s, MOD    63.3 ml A2C: LV vol s, MOD    35.2 ml A4C: LV SV MOD A2C:   8.5 ml LV SV MOD A4C:   69.6 ml LV SV MOD BP:    23.8 ml RIGHT VENTRICLE RV Basal diam:  2.60 cm RV Mid diam:    2.20 cm RV S  prime:     9.90 cm/s TAPSE (M-mode): 1.7 cm LEFT ATRIUM             Index        RIGHT ATRIUM          Index LA diam:        3.60 cm 1.93 cm/m   RA Area:     8.17 cm LA Vol (A2C):   30.3 ml 16.24 ml/m  RA Volume:   14.00 ml 7.50 ml/m LA Vol (A4C):   23.7 ml 12.70 ml/m LA Biplane Vol: 26.6 ml 14.26 ml/m  AORTIC VALVE AV Area (Vmax):    3.16 cm AV Area (Vmean):   3.33 cm AV Area (VTI):     3.56 cm AV Vmax:           86.50 cm/s AV Vmean:          60.300 cm/s AV VTI:            0.141 m AV Peak Grad:      3.0 mmHg AV Mean Grad:      2.0 mmHg LVOT Vmax:         87.00 cm/s LVOT Vmean:        64.000 cm/s LVOT VTI:          0.160 m LVOT/AV VTI ratio: 1.13  AORTA Ao Root diam: 3.00 cm MITRAL VALVE               TRICUSPID VALVE MV Area (PHT): 1.92 cm    TR Peak grad:   15.8 mmHg MV Decel Time: 396 msec    TR Vmax:        199.00 cm/s MV E velocity: 56.60 cm/s MV A velocity: 75.50 cm/s  SHUNTS MV E/A ratio:  0.75        Systemic VTI:  0.16 m                            Systemic Diam: 2.00 cm Debbe Odea MD Electronically signed by Debbe Odea MD Signature Date/Time: 02/15/2023/4:34:39 PM    Final    CARDIAC CATHETERIZATION  Result Date: 02/15/2023    Prox RCA to Mid RCA lesion is 80% stenosed.   Ost Cx to Prox Cx lesion is 30% stenosed.   LPAV lesion is 50% stenosed.   Mid LAD-1 lesion is 99% stenosed.   Mid LAD-2 lesion is 70% stenosed.   2nd Diag lesion is 40% stenosed.   Dist LAD lesion is 50% stenosed.   Mid LAD-3 lesion is 30% stenosed.   A drug-eluting stent was successfully placed using a STENT ONYX FRONTIER 3.0X34.   Post intervention, there is a 0% residual stenosis.   Post intervention, there is a 0% residual stenosis.   There is moderate left ventricular systolic dysfunction.   LV end diastolic pressure is normal.   The left ventricular ejection fraction is 35-45% by visual estimate.   As long as the patient continues to meet low risk STEMI criteria and in the absence of any other complications or medical issues, we expect the patient to be ready for discharge on 02/16/2023.   Recommend uninterrupted dual antiplatelet therapy with Aspirin 81mg  daily and Ticagrelor 90mg  twice daily for a minimum of 12 months (ACS-Class I recommendation). 1.  Significant two-vessel coronary artery disease.  The culprit for anterior STEMI is 99% stenosis in the mid LAD.  There is also 80% stenosis in the proximal/mid right  coronary artery. 2.  Moderately reduced LV systolic function with normal left ventricular end-diastolic pressure. 3.  Successful angioplasty and drug-eluting stent placement to the mid LAD.  A long stent was used to cover the second lesion shortly after second diagonal which was jailed by the stent but had normal TIMI-3 flow. Recommendations: Dual antiplatelet therapy for 12 months. Aggressive treatment of risk factors. Staged RCA PCI is recommended in the outpatient in few weeks. The patient is a candidate for accelerated discharge in 24-36 hours if her EF is greater than 35% by echo.    Microbiology: Results for orders placed or performed during the hospital encounter of 02/21/23  SARS Coronavirus 2 by RT PCR (hospital order, performed in [facility] hospital lab) *cepheid single result test* Anterior Nasal Swab     Status: None   Collection Time: 02/21/23 10:12 AM   Specimen: Anterior Nasal Swab  Result Value Ref Range Status   SARS Coronavirus 2 by RT PCR NEGATIVE NEGATIVE Final    Comment: (NOTE) SARS-CoV-2 target nucleic acids are NOT DETECTED.  The SARS-CoV-2 RNA is generally detectable in upper and lower respiratory specimens during the acute phase of infection. The lowest concentration of SARS-CoV-2 viral copies this assay can detect is 250 copies / mL. A negative result does not preclude SARS-CoV-2 infection and should not be used as the sole basis for treatment or other patient management decisions.  A negative result may occur with improper specimen collection / handling, submission of specimen other than nasopharyngeal swab, presence of viral mutation(s) within the areas targeted by this assay, and inadequate number of viral copies (<250 copies / mL). A negative result must be combined with clinical observations, patient history, and epidemiological information.  Fact Sheet for Patients:   RoadLapTop.co.za  Fact Sheet for Healthcare Providers: http://kim-miller.com/  This test is not yet approved or  cleared by the [place] FDA and has been authorized for detection and/or diagnosis of SARS-CoV-2 by FDA under an Emergency Use Authorization (EUA).  This EUA will remain in effect (meaning this test can be used) for the duration of the COVID-19 declaration under Section 564(b)(1) of the Act, 21 U.S.C. section 360bbb-3(b)(1), unless the authorization is terminated or revoked sooner.  Performed at [facility], [address] [place], [place] [address]   Respiratory (~20 pathogens) panel by PCR     Status: None   Collection Time: 02/21/23 10:30 AM  Result Value Ref Range Status   Adenovirus NOT DETECTED NOT DETECTED Final   Coronavirus 229E NOT  DETECTED NOT DETECTED Final    Comment: (NOTE) The Coronavirus on the Respiratory Panel, DOES NOT test for the novel  Coronavirus (2019 nCoV)    Coronavirus HKU1 NOT DETECTED NOT DETECTED Final   Coronavirus NL63 NOT DETECTED NOT DETECTED Final   Coronavirus OC43 NOT DETECTED NOT DETECTED Final   Metapneumovirus NOT DETECTED NOT DETECTED Final   Rhinovirus / Enterovirus NOT DETECTED NOT DETECTED Final   Influenza A NOT DETECTED NOT DETECTED Final   Influenza B NOT DETECTED NOT DETECTED Final   Parainfluenza Virus 1 NOT DETECTED NOT DETECTED Final   Parainfluenza Virus 2 NOT DETECTED NOT DETECTED Final   Parainfluenza Virus 3 NOT DETECTED NOT DETECTED Final   Parainfluenza Virus 4 NOT DETECTED NOT DETECTED Final   Respiratory Syncytial Virus NOT DETECTED NOT DETECTED Final   Bordetella pertussis NOT DETECTED NOT DETECTED Final   Bordetella Parapertussis NOT DETECTED NOT DETECTED Final   Chlamydophila pneumoniae NOT DETECTED NOT  DETECTED Final   Mycoplasma pneumoniae NOT DETECTED NOT DETECTED Final    Comment: Performed at [facility], [address] [place], [place] [address]    Labs: CBC: Recent Labs  Lab 02/16/23 0351 02/17/23 0402 02/21/23 0012 02/21/23 0945 02/22/23 0635  WBC 9.3 10.5 8.6 8.3 7.6  HGB 13.1 13.1 12.1 12.2 12.1  HCT 40.2 39.3 37.2 36.6 36.0  MCV 92.8 91.8 93.9 92.9 91.6  PLT 278 265 290 246 259   Basic Metabolic Panel: Recent Labs  Lab 02/16/23 0351 02/17/23 0402 02/21/23 0012 02/21/23 0220 02/22/23 0635  NA 141 139 134* 135 140  K 3.6 3.7 3.8 3.5 3.5  CL 106 108 106 110 108  CO2 25 25 23 22 22   GLUCOSE 95 98 112* 97 87  BUN 13 18 18 16 11   CREATININE 0.87 1.03* 1.13* 1.07* 0.87  CALCIUM 8.7* 8.5* 8.4* 7.6* 8.4*  MG 2.0  --   --   --   --    Liver Function Tests: Recent Labs  Lab 02/21/23 0949  AST 23  ALT 23  ALKPHOS 58  BILITOT 0.5  PROT 6.7  ALBUMIN 3.5   CBG: No results for input(s): "GLUCAP" in the last 168  hours.  Discharge time spent: less than 30 minutes.  Signed: Ernestene Mention, MD [facility] 02/22/2023

## 2023-02-22 NOTE — TOC Benefit Eligibility Note (Signed)
Patient Product/process development scientist completed.    The patient is insured through [facility]. Patient has Medicare and is not eligible for a copay card, but may be able to apply for patient assistance, if available.    Ran test claim for Brilinta 90 mg and the current 30 day co-pay is $4.60.   This test claim was processed through [facility]- copay amounts may vary at other pharmacies due to pharmacy/plan contracts, or as the patient moves through the different stages of their insurance plan.     Roland Earl, CPHT Pharmacy Patient Advocate Specialist [facility] Patient Advocate Team Direct Number: [phone]  Fax: [phone]

## 2023-02-22 NOTE — Progress Notes (Signed)
*  PRELIMINARY RESULTS* Echocardiogram 2D Echocardiogram has been performed.  Cristela Blue 02/22/2023, 8:52 AM

## 2023-02-22 NOTE — Progress Notes (Signed)
Cardiology Progress Note   Patient Name: Karina Freeman Date of Encounter: 02/22/2023  Primary Cardiologist: Lorine Bears, MD  Subjective   Feels well this AM.  No chest pain or dyspnea.  Ambulating w/o difficulty.  Inpatient Medications    Scheduled Meds:  aspirin  81 mg Oral Daily   atorvastatin  80 mg Oral Daily   carvedilol  12.5 mg Oral BID WC   enoxaparin (LOVENOX) injection  40 mg Subcutaneous Q24H   losartan  50 mg Oral Daily   sodium chloride flush  3 mL Intravenous Q12H   spironolactone  25 mg Oral Daily   ticagrelor  90 mg Oral BID   Continuous Infusions:  sodium chloride     PRN Meds: sodium chloride, acetaminophen **OR** acetaminophen, magnesium hydroxide, ondansetron **OR** ondansetron (ZOFRAN) IV, mouth rinse, sodium chloride flush, traZODone   Vital Signs    Vitals:   02/21/23 2357 02/22/23 0325 02/22/23 0758 02/22/23 0800  BP: (!) 179/82 (!) 174/83 (!) 162/80 (!) 164/70  Pulse: 64 62 70 71  Resp: 16 (!) 24 16   Temp: 97.7 F (36.5 C) 98.2 F (36.8 C) 97.9 F (36.6 C)   TempSrc:  Oral Oral   SpO2: 99% 98% 98% 97%  Weight:      Height:        Intake/Output Summary (Last 24 hours) at 02/22/2023 0937 Last data filed at 02/22/2023 0842 Gross per 24 hour  Intake 1223.25 ml  Output --  Net 1223.25 ml   Filed Weights   02/21/23 0020 02/21/23 0845  Weight: 73.9 kg 73.9 kg    Physical Exam   GEN: Well nourished, well developed, in no acute distress.  HEENT: Grossly normal.  Neck: Supple, no JVD, carotid bruits, or masses. Cardiac: RRR, no murmurs, rubs, or gallops. No clubbing, cyanosis, edema.  Radials 2+, DP/PT 2+ and equal bilaterally.  R radial cath site w/o bleeding/bruit/hematoma. Respiratory:  Respirations regular and unlabored, clear to auscultation bilaterally. GI: Soft, nontender, nondistended, BS + x 4. MS: no deformity or atrophy. Skin: warm and dry, no rash. Neuro:  Strength and sensation are intact. Psych: AAOx3.  Normal  affect.  Labs    Chemistry Recent Labs  Lab 02/21/23 0012 02/21/23 0220 02/21/23 0949 02/22/23 0635  NA 134* 135  --  140  K 3.8 3.5  --  3.5  CL 106 110  --  108  CO2 23 22  --  22  GLUCOSE 112* 97  --  87  BUN 18 16  --  11  CREATININE 1.13* 1.07*  --  0.87  CALCIUM 8.4* 7.6*  --  8.4*  PROT  --   --  6.7  --   ALBUMIN  --   --  3.5  --   AST  --   --  23  --   ALT  --   --  23  --   ALKPHOS  --   --  58  --   BILITOT  --   --  0.5  --   GFRNONAA 52* 55*  --  >60  ANIONGAP 5 3*  --  10     Hematology Recent Labs  Lab 02/21/23 0012 02/21/23 0945 02/22/23 0635  WBC 8.6 8.3 7.6  RBC 3.96 3.94 3.93  HGB 12.1 12.2 12.1  HCT 37.2 36.6 36.0  MCV 93.9 92.9 91.6  MCH 30.6 31.0 30.8  MCHC 32.5 33.3 33.6  RDW 11.9 11.9 11.9  PLT 290 246  259    Cardiac Enzymes  Recent Labs  Lab 02/15/23 0700 02/15/23 1011 02/16/23 0918 02/21/23 0012 02/21/23 0220  TROPONINIHS 201* 1,235* 1,092* 111* 90*      BNP    Component Value Date/Time   BNP 196.5 (H) 02/21/2023 0949    Lipids  Lab Results  Component Value Date   CHOL 241 (H) 02/15/2023   HDL 60 02/15/2023   LDLCALC 164 (H) 02/15/2023   TRIG 84 02/15/2023   CHOLHDL 4.0 02/15/2023    HbA1c  Lab Results  Component Value Date   HGBA1C 5.4 02/15/2023    Radiology    DG Chest Port 1 View  Result Date: 02/21/2023 CLINICAL DATA:  Chest pain EXAM: PORTABLE CHEST 1 VIEW COMPARISON:  07/14/2013 FINDINGS: The heart size and mediastinal contours are within normal limits. Both lungs are clear. The visualized skeletal structures are unremarkable. IMPRESSION: No active disease. Electronically Signed   By: Deatra Robinson M.D.   On: 02/21/2023 00:30    Telemetry    RSR, rare PVC - Personally Reviewed  ECG    RSR, 64, nonspecific T changes - Personally Reviewed  Cardiac Studies   Cardiac Catheterization and Percutaneous Coronary Intervention 8.8.2024  Left Main  Vessel is angiographically normal.    Left  Anterior Descending  Prox LAD lesion is 30% stenosed. The lesion is dissected. Optical coherence tomography (OCT) was performed.  Non-stenotic Mid LAD-1 lesion was previously treated. Vessel is the culprit lesion. The lesion is not complex (non high-C).    *stent edge dissection successfully treated with 3.0 x 8 mm Onyx Frontier DES*  Non-stenotic Mid LAD-2 lesion was previously treated.  Mid LAD-3 lesion is 30% stenosed.  Dist LAD lesion is 50% stenosed.    First Diagonal Branch  Vessel is moderate in size.    Second Diagonal Branch  2nd Diag lesion is 40% stenosed.    Third Diagonal Branch  Vessel is small in size. Vessel is angiographically normal.    Left Circumflex  Ost Cx to Prox Cx lesion is 30% stenosed.    Left Posterior Atrioventricular Artery  LPAV lesion is 50% stenosed.    Right Coronary Artery  Prox RCA to Mid RCA lesion is 60% stenosed. Pressure gradient was performed on the lesion using a GUIDEWIRE PRESSURE X 175 and CATH LAUNCHER 6FR JR4. RFR: 0.98.    Right Posterior Descending Artery  Vessel is angiographically normal.    Right Posterior Atrioventricular Artery  Vessel is small in size.    First Right Posterolateral Branch  Vessel is angiographically normal.    Second Right Posterolateral Branch  Vessel is angiographically normal.            Patient Profile     72 y.o. female with a history of CAD s/p recent Anterior STEMI and LAD PCI (residual RCA dzs), HFrEF/ICM (30-35%), HTN, HL, CVA, GERD, depression, anxiety, and osteoporosis, who was admitted 8/7 w/ unstable angina and underwent PCI of the proximal LAD.  Assessment & Plan    1.  Unstable angina/CAD:  recent anterior STEMI w/PCI/DES to the mid LAD.  Presented August 7 due to recurrent chest pain.  Troponins trended down from prior hospitalization.  Diagnostic catheterization August 8 showed proximal edge dissection involving the LAD, which was treated with drug-eluting stent.  Pressure wire  measurement in the RCA was normal.  Patient feels well this morning and has been ambulating without difficulty.  Discharged today.  Continue aspirin, Brilinta, beta-blocker, ARB, [place] statin therapy, and  as needed nitrates.  2.  Chronic heart failure with reduced ejection fraction/ischemic cardiomyopathy: EF 30 to 35% by echo following anterior MI earlier in the week.  Limited echo today shows improvement in EF to 65-70% and mild MR.  Euvolemic on examination.  Continue current medications.  3.  Hypertension: Blood pressure elevated.  Continue beta-blocker and ARB.  Spironolactone added.  Follow-up basic metabolic panel in 1 week.  4.  Hyperlipidemia: LDL 164 on August 2 at which time she was statin nave.  Continue high potency statin therapy.  Signed, Nicolasa Ducking, NP  02/22/2023, 9:37 AM    For questions or updates, please contact   Please consult [email or web address] for contact info under Cardiology/STEMI.

## 2023-02-22 NOTE — Telephone Encounter (Signed)
Left voice mail to inform patient that we do not have anything sooner.

## 2023-02-24 ENCOUNTER — Telehealth: Payer: Self-pay | Admitting: Student

## 2023-02-24 NOTE — Telephone Encounter (Signed)
   Daughter called [employer] during after hours. Called and spoke with daughter with patient in the background.  She was recently discharged from the hospital on 02/22/2023 after presenting with recurrent chest pain after recent STEMI on 02/15/2023 where she underwent DES to LAD.  She underwent relook cath on 02/21/2023 which showed a focal dissection at the proximal edge of recently placed LAD stent.  She underwent successful OCT guided PCI with DES that overlapped prior stent.  Daughter states patient has been very anxious and is currently reporting a little stiffness in her neck and a headache.  She is worried that her "blood is not flowing right."  She denies any chest pain or shortness of breath.  No symptoms like what she was having prior to her recent admissions.  Her daughter states she is having a lot of anxiety.  She tried to use her home BP machine and it gave an error message which made her more anxious.  She does not have any medications for anxiety.  Recommended discussing this with her PCP at next visit.  In the meantime, recommended trying to get her mind off her recent heart attack and doing activities that she enjoys to help with her anxiety.  In regards to her headache/stiff neck, recommended trying Tylenol to see if that helps.  I don't think this is a problem with her recent stents as I would expect her to have recurrent chest pain with this.  She has a follow-up visit on 03/07/2023.  Advised patient to call us if she has any questions or concerns prior to this.  Daughter voiced understanding and thanked me for calling.  Corrin Parker, PA-C 02/24/2023 2:36 PM

## 2023-02-28 ENCOUNTER — Other Ambulatory Visit
Admission: RE | Admit: 2023-02-28 | Discharge: 2023-02-28 | Disposition: A | Discharge status: Home / Self Care | Payer: Medicare Other | Attending: Nurse Practitioner | Admitting: Nurse Practitioner

## 2023-02-28 DIAGNOSIS — I10 Essential (primary) hypertension: Secondary | ICD-10-CM | POA: Diagnosis not present

## 2023-02-28 LAB — BASIC METABOLIC PANEL
Anion gap: 8 (ref 5–15)
BUN: 16 mg/dL (ref 8–23)
CO2: 25 mmol/L (ref 22–32)
Calcium: 8.7 mg/dL — ABNORMAL LOW (ref 8.9–10.3)
Chloride: 104 mmol/L (ref 98–111)
Creatinine, Ser: 1.31 mg/dL — ABNORMAL HIGH (ref 0.44–1.00)
GFR, Estimated: 43 mL/min — ABNORMAL LOW (ref >60)
Glucose, Bld: 96 mg/dL (ref 70–99)
Potassium: 3.8 mmol/L (ref 3.5–5.1)
Sodium: 137 mmol/L (ref 135–145)

## 2023-03-01 ENCOUNTER — Other Ambulatory Visit: Payer: Self-pay | Admitting: *Deleted

## 2023-03-01 MED ORDER — LOSARTAN POTASSIUM 50 MG PO TABS: 25.0000 mg | ORAL_TABLET | Freq: Every day | ORAL | Status: DC

## 2023-03-05 NOTE — Progress Notes (Unsigned)
Cardiology Office Note:  .   Date:  03/07/2023  ID:  Karina Freeman, DOB Jul 28, 1950, MRN [id number] PCP: Reubin Milan, MD  [facility] Providers Cardiologist:  Lorine Bears, MD    History of Present Illness: .   Karina Freeman is a 72 y.o. female with past medical history of CAD s/p anterior STEMI and LAD PCI, HFrEF/ICM, HTN, HLD, CVA, GERD, depression, anxiety and osteoporosis. She is followed by Dr. Kirke Corin, she presents today for follow up regarding recent STEMI.   On 02/15/2023 Karina Freeman presented to the ED with chest discomfort, was found to have an anterior STEMI, hs troponin peaked at 1235. She went for emergency cardiac catheterization that indicated significnat two vessel CAD, 99% stenosis in the mid LAD, 80% stenosi sin the proximal/mid RCA she had successful PCI/DES to the mid LAD, long stent was used to cover second LAD lesion after second diagonal. It was recommended she had stage RCA PCI outpatient in a few weeks. Following MI her LVEF was 30-35% by echo. She was discharged on 8/4 on GDMT.   On 02/20/23 she had sudden onset of 8/10 substernal chest pressure and heaviness, not relieved with ASA/NTG, resolved after 7 hours. On 8/8 she underwent repeat LHC that indicated focal dissection at proximal edge of proximal/mid LAD, successful PCI with DES to proximal LAD edge-dissection that overlapped with previously place stent. Recommended continued dual antiplatelet therapy with aspirin and Brilinta for at least 12 months. Repeat limited echo indicated LVEF of 65 to 70% with LV normal function. She was started on spironolactone and her losartan was increased during admission. She was discharged on 02/22/23.  Follow up BMET on 02/28/23 indicated increased creatinine at 1.31 (previously 0.87), stable potassium. She was instructed to hold her spironolactone and decrease her losartan to 25mg  daily until scheduled follow up.   Today she presents for follow up. She reports she is doing  well overall, she has not significantly increased her activity following discharge, notes she cleaned her house Sunday but tolerated that well. She denies chest pain, shortness of breath, lower extremity edema or palpitations. Reports she has been monitoring her blood pressure at home, ranging 140/74-160/90, heart rate low 60's.   ROS: Today she denies chest pain, shortness of breath, lower extremity edema, fatigue, palpitations, melena, hematuria, hemoptysis, diaphoresis, weakness, presyncope, syncope, orthopnea, and PND.   Studies Reviewed: Marland Kitchen   EKG Interpretation Date/Time:  Thursday March 07 2023 11:10:09 EDT Ventricular Rate:  65 PR Interval:  122 QRS Duration:  80 QT Interval:  456 QTC Calculation: 474 R Axis:   52  Text Interpretation: Normal sinus rhythm ST & T wave abnormality, consider anterolateral ischemia Prolonged QT When compared with ECG of 22-Feb-2023 05:31, ST now depressed in Anterior leads T wave inversion now evident in Anterior leads Confirmed by Reather Littler [id number]) on 03/07/2023 11:21:07 AM   Cardiac Studies & Procedures   CARDIAC CATHETERIZATION  CARDIAC CATHETERIZATION 02/21/2023  Narrative Conclusions: Multivessel coronary artery disease, as detailed below.  Mid/distal LAD, LCx, and RCA disease is similar to last catheterization.  Moderate to severe RCA disease (previously described as up to 80% stenosis, is not hemodynamically significant; RFR = 0.98). Focal dissection at proximal edge of recently placed proximal/mid LAD stent extending into the media. Normal left ventricular filling pressure (LVEDP 10 mmHg). Successful OCT-guided PCI to proximal LAD edge-dissection using Onyx Frontier 3.0 x 8 mm drug-eluting stent (overlaps with previously placed stent) with 0% residual stenosis and TIMI-3 flow.  Recommendations: Continue dual antiplatelet therapy with aspirin and ticagrelor for at least 12 months. Aggressive secondary prevention of coronary artery disease.   Defer staged PCI to RCA given reassuring functional assessment (RFR = 0.98). Repeat echo to ensure LVEF has not dropped further.  Karina Kendall, MD [facility]  Findings Coronary Findings Diagnostic  Dominance: Right  Left Main Vessel is angiographically normal.  Left Anterior Descending Prox LAD lesion is 30% stenosed. The lesion is dissected. Optical coherence tomography (OCT) was performed. Non-stenotic Mid LAD-1 lesion was previously treated. Vessel is the culprit lesion. The lesion is not complex (non high-C). Non-stenotic Mid LAD-2 lesion was previously treated. Mid LAD-3 lesion is 30% stenosed. Dist LAD lesion is 50% stenosed.  First Diagonal Branch Vessel is moderate in size.  Second Diagonal Branch 2nd Diag lesion is 40% stenosed.  Third Diagonal Branch Vessel is small in size. Vessel is angiographically normal.  Left Circumflex Ost Cx to Prox Cx lesion is 30% stenosed.  Left Posterior Atrioventricular Artery LPAV lesion is 50% stenosed.  Right Coronary Artery Prox RCA to Mid RCA lesion is 60% stenosed. Pressure gradient was performed on the lesion using a GUIDEWIRE PRESSURE X 175 and CATH LAUNCHER 6FR JR4. RFR: 0.98.  Right Posterior Descending Artery Vessel is angiographically normal.  Right Posterior Atrioventricular Artery Vessel is small in size.  First Right Posterolateral Branch Vessel is angiographically normal.  Second Right Posterolateral Branch Vessel is angiographically normal.  Intervention  Prox LAD lesion Stent (Also treats lesions: Mid LAD-1) CATH LAUNCHER 6FR EBU3.5 guide catheter was inserted. Lesion crossed with guidewire using a WIRE RUNTHROUGH .[id number]. A drug-eluting stent was successfully placed using a STENT ONYX FRONTIER 3.0X08. Maximum pressure: 14 atm. Post-stent angioplasty was performed using a BALLN Plummer TREK NEO RX 3.25X12. Maximum pressure:  16 atm. Post-Intervention Lesion Assessment The intervention was  successful. Pre-interventional TIMI flow is 3. Post-intervention TIMI flow is 3. No complications occurred at this lesion. There is a 0% residual stenosis post intervention.  Mid LAD-1 lesion Stent (Also treats lesions: Prox LAD) See details in Prox LAD lesion. Post-Intervention Lesion Assessment The intervention was successful. Pre-interventional TIMI flow is 3. Post-intervention TIMI flow is 3. No complications occurred at this lesion. There is a 0% residual stenosis post intervention.   CARDIAC CATHETERIZATION  CARDIAC CATHETERIZATION 02/15/2023  Narrative   Prox RCA to Mid RCA lesion is 80% stenosed.   Ost Cx to Prox Cx lesion is 30% stenosed.   LPAV lesion is 50% stenosed.   Mid LAD-1 lesion is 99% stenosed.   Mid LAD-2 lesion is 70% stenosed.   2nd Diag lesion is 40% stenosed.   Dist LAD lesion is 50% stenosed.   Mid LAD-3 lesion is 30% stenosed.   A drug-eluting stent was successfully placed using a STENT ONYX FRONTIER 3.0X34.   Post intervention, there is a 0% residual stenosis.   Post intervention, there is a 0% residual stenosis.   There is moderate left ventricular systolic dysfunction.   LV end diastolic pressure is normal.   The left ventricular ejection fraction is 35-45% by visual estimate.   As long as the patient continues to meet low risk STEMI criteria and in the absence of any other complications or medical issues, we expect the patient to be ready for discharge on 02/16/2023.   Recommend uninterrupted dual antiplatelet therapy with Aspirin 81mg  daily and Ticagrelor 90mg  twice daily for a minimum of 12 months (ACS-Class I recommendation).  1.  Significant two-vessel coronary artery disease.  The culprit for anterior STEMI is 99% stenosis in the mid LAD.  There is also 80% stenosis in the proximal/mid right coronary artery. 2.  Moderately reduced LV systolic function with normal left ventricular end-diastolic pressure. 3.  Successful angioplasty and drug-eluting  stent placement to the mid LAD.  A long stent was used to cover the second lesion shortly after second diagonal which was jailed by the stent but had normal TIMI-3 flow.  Recommendations: Dual antiplatelet therapy for 12 months. Aggressive treatment of risk factors. Staged RCA PCI is recommended in the outpatient in few weeks. The patient is a candidate for accelerated discharge in 24-36 hours if her EF is greater than 35% by echo.  Findings Coronary Findings Diagnostic  Dominance: Right  Left Main Vessel is angiographically normal.  Left Anterior Descending Mid LAD-1 lesion is 99% stenosed. Vessel is the culprit lesion. The lesion is not complex (non high-C). Mid LAD-2 lesion is 70% stenosed. Mid LAD-3 lesion is 30% stenosed. Dist LAD lesion is 50% stenosed.  Second Diagonal Branch 2nd Diag lesion is 40% stenosed.  Third Diagonal Branch Vessel is small in size. Vessel is angiographically normal.  Left Circumflex Ost Cx to Prox Cx lesion is 30% stenosed.  Left Posterior Atrioventricular Artery LPAV lesion is 50% stenosed.  Right Coronary Artery Prox RCA to Mid RCA lesion is 80% stenosed.  Right Posterior Descending Artery Vessel is angiographically normal.  Right Posterior Atrioventricular Artery Vessel is small in size.  First Right Posterolateral Branch Vessel is angiographically normal.  Second Right Posterolateral Branch Vessel is angiographically normal.  Intervention  Mid LAD-1 lesion Stent (Also treats lesions: Mid LAD-2) Lesion length:  30 mm. CATH LAUNCHER 6FR EBU3.5 guide catheter was inserted. Lesion crossed with guidewire using a WIRE RUNTHROUGH .[id number]. Pre-stent angioplasty was performed using a BALLN TREK RX 2.5X12. Maximum pressure:  8 atm. Inflation time: 20 sec. A drug-eluting stent was successfully placed using a STENT ONYX FRONTIER 3.0X34. Maximum pressure: 16 atm. Inflation time: 20 sec. Post-stent angioplasty was performed using a BALLN   TREK NEO RX 3.25X20. Maximum pressure:  14 atm. Inflation time:  20 sec. Post-Intervention Lesion Assessment The intervention was successful. Pre-interventional TIMI flow is 3. Post-intervention TIMI flow is 3. No complications occurred at this lesion. There is a 0% residual stenosis post intervention.  Mid LAD-2 lesion Stent (Also treats lesions: Mid LAD-1) See details in Mid LAD-1 lesion. Post-Intervention Lesion Assessment The intervention was successful. Pre-interventional TIMI flow is 3. Post-intervention TIMI flow is 3. No complications occurred at this lesion. There is a 0% residual stenosis post intervention.     ECHOCARDIOGRAM  ECHOCARDIOGRAM LIMITED 02/22/2023  Narrative ECHOCARDIOGRAM LIMITED REPORT    Patient Name:   Karina Freeman Date of Exam: 02/22/2023 Medical Rec #:  [id number]       Height:       67.5 in Accession #:    [id number]      Weight:       163.0 lb Date of Birth:  [birth year]      BSA:          1.864 m Patient Age:    71 years        BP:           164/70 mmHg Patient Gender: F               HR:           71 bpm. Exam Location:  [facility]  Procedure: Limited Echo, Color  Doppler and Cardiac Doppler  Indications:     Chest pain R07.9  History:         Patient has prior history of Echocardiogram examinations, most recent 02/15/2023. Risk Factors:Hypertension. CVA.  Sonographer:     Cristela Blue Referring Phys:  [id number] CHRISTOPHER END Diagnosing Phys: Debbe Odea MD  IMPRESSIONS   1. Left ventricular ejection fraction, by estimation, is 65 to 70%. The left ventricle has normal function. 2. Right ventricular systolic function is normal. The right ventricular size is normal. 3. The mitral valve is normal in structure. Mild mitral valve regurgitation. 4. The inferior vena cava is normal in size with greater than 50% respiratory variability, suggesting right atrial pressure of 3 mmHg.  FINDINGS Left Ventricle: Left ventricular ejection fraction, by  estimation, is 65 to 70%. The left ventricle has normal function. The left ventricular internal cavity size was normal in size. There is no left ventricular hypertrophy.  Right Ventricle: The right ventricular size is normal. No increase in right ventricular wall thickness. Right ventricular systolic function is normal.  Left Atrium: Left atrial size was normal in size.  Right Atrium: Right atrial size was normal in size.  Pericardium: There is no evidence of pericardial effusion.  Mitral Valve: The mitral valve is normal in structure. Mild mitral valve regurgitation.  Venous: The inferior vena cava is normal in size with greater than 50% respiratory variability, suggesting right atrial pressure of 3 mmHg.  Additional Comments: Spectral Doppler performed. Color Doppler performed.  LEFT VENTRICLE PLAX 2D LVIDd:         4.40 cm LVIDs:         2.90 cm LV PW:         0.90 cm LV IVS:        0.70 cm   Debbe Odea MD Electronically signed by Debbe Odea MD Signature Date/Time: 02/22/2023/12:03:43 PM    Final              Risk Assessment/Calculations:     HYPERTENSION CONTROL Vitals:   03/07/23 1103 03/07/23 1140  BP: (!) 160/82 (!) 152/78    The patient's blood pressure is elevated above target today.  In order to address the patient's elevated BP: Blood pressure will be monitored at home to determine if medication changes need to be made.; A current anti-hypertensive medication was adjusted today.          Physical Exam:   VS:  BP (!) 152/78   Pulse 65   Ht 5' 7.5" (1.715 m)   Wt 162 lb (73.5 kg)   LMP  (LMP Unknown)   SpO2 99%   BMI 25.00 kg/m    Wt Readings from Last 3 Encounters:  03/07/23 162 lb (73.5 kg)  02/21/23 163 lb (73.9 kg)  02/15/23 163 lb 5.8 oz (74.1 kg)    GEN: Well nourished, well developed in no acute distress NECK: No JVD; No carotid bruits CARDIAC: RRR, no murmurs, rubs, gallops, right radial cath site clean and intact, no  evidence of hematoma RESPIRATORY:  Clear to auscultation without rales, wheezing or rhonchi  ABDOMEN: Soft, non-tender, non-distended EXTREMITIES:  No edema; No deformity   ASSESSMENT AND PLAN: .    Coronary Artery Disease: Anterior STEMI with PCI/DES to mid LAD on 02/15/23. Presented to the ED with chest pain on 8/7, underwent LHC on 8/8 showed proximal edge dissection involving the LAD, treated with DES. Pressure wire measurement in the RCA was normal, staged PCI of RCA deferred given RFR  0.98. EKG today shows T wave inversion and ST depression in anterior leads, reviewed with Dr. Kirke Corin, as she is stable with no symptoms of angina there is no need for staged PCI of RCA. Her right radial cath sites are clean and intact, no evidence of hematoma. Reviewed ED precautions. Will send approval to start cardiac rehab. Continue aspirin, Brilinta, atorvastatin, Carvedilol, Losartan and as needed nitroglycerin. Prescriptions sent to her home pharmacy.    HFimpEF/ICM: Following MI EF 30 to 35% by echo. Limited echo 02/22/23 showed improvement in EF to 65-70% and mild MR. During admission her Losartan was increased and she was started on Spironolactone, follow up BMET showed increased creatinine at 1.31, she was instructed to hold spironolactone and reduce her Losartan to 25mg  daily. Today she appears euvolemic and well compensated on exam. She denies shortness of breath, lower extremity edema, orthopnea or PND. Will recheck BMET today and increase her Losartan back to 50mg  daily, recheck BMET in one week.   Primary HTN: Initial blood pressure today 160/82, on recheck was 152/78. Home blood pressure ranging 140/74-160/90, heart rate low 60's Losartan was reduced to 25mg  daily and spironolactone stopped on 02/28/23 in setting of elevated creatinine following discharge. Will recheck BMET today and increase her Losartan back to 50mg  daily, recheck BMET in one week.   HLD: Last lipid profile on 02/15/23 indicated total  cholesterol of 241 and LDL of 164, lipoprotein (a) 40.5. She was started on atorvastatin 80mg  daily. Recheck fasting lipid profile and LFTs in 6-8 weeks.     Cardiac Rehabilitation Eligibility Assessment  The patient is ready to start cardiac rehabilitation from a cardiac standpoint.    Informed Consent    Dispo: Follow up with Charlsie Quest, NP in two months.   Signed, Rip Harbour, NP

## 2023-03-07 ENCOUNTER — Ambulatory Visit: Payer: Medicare Other | Attending: Cardiology | Admitting: Cardiology

## 2023-03-07 ENCOUNTER — Encounter: Payer: Self-pay | Admitting: Cardiology

## 2023-03-07 VITALS — BP 152/78 | HR 65 | Ht 67.5 in | Wt 162.0 lb

## 2023-03-07 DIAGNOSIS — I2510 Atherosclerotic heart disease of native coronary artery without angina pectoris: Secondary | ICD-10-CM | POA: Diagnosis not present

## 2023-03-07 DIAGNOSIS — I255 Ischemic cardiomyopathy: Secondary | ICD-10-CM | POA: Diagnosis not present

## 2023-03-07 DIAGNOSIS — E782 Mixed hyperlipidemia: Secondary | ICD-10-CM

## 2023-03-07 DIAGNOSIS — I5032 Chronic diastolic (congestive) heart failure: Secondary | ICD-10-CM

## 2023-03-07 DIAGNOSIS — I5020 Unspecified systolic (congestive) heart failure: Secondary | ICD-10-CM

## 2023-03-07 DIAGNOSIS — Z79899 Other long term (current) drug therapy: Secondary | ICD-10-CM

## 2023-03-07 DIAGNOSIS — I10 Essential (primary) hypertension: Secondary | ICD-10-CM | POA: Diagnosis not present

## 2023-03-07 MED ORDER — ASPIRIN 81 MG PO CHEW: 81.0000 mg | CHEWABLE_TABLET | Freq: Every day | ORAL | 3 refills | Status: AC

## 2023-03-07 MED ORDER — CARVEDILOL 12.5 MG PO TABS: 12.5000 mg | ORAL_TABLET | Freq: Two times a day (BID) | ORAL | 3 refills | Status: AC

## 2023-03-07 MED ORDER — ATORVASTATIN CALCIUM 80 MG PO TABS: 80.0000 mg | ORAL_TABLET | Freq: Every day | ORAL | 3 refills | Status: AC

## 2023-03-07 MED ORDER — LOSARTAN POTASSIUM 50 MG PO TABS: 50.0000 mg | ORAL_TABLET | Freq: Every day | ORAL | 3 refills | Status: DC

## 2023-03-07 NOTE — Patient Instructions (Signed)
Medication Instructions:  Increase Losartan to 50 MG daily.  *If you need a refill on your cardiac medications before your next appointment, please call your pharmacy*   Lab Work: Your provider would like for you to have following labs drawn today BMP  Your provider would like for you to return in 1 week to have the following labs drawn: BMP.   Please go to [facility] [address] ([facility], [place] [address] You do not need an appointment.  They are open from 7:30 am-4 pm.  Lunch from 1:00 pm- 2:00 pm You will not need to be fasting.  You may also go to any of these [facility] locations:  [facility]  - [address] ([facility])  Your provider would like for you to return in 6-8 weeks to have the following labs drawn: Lipid and LFT.   Please go to [facility] [address] ([facility], [place] [address] You do not need an appointment.  They are open from 7:30 am-4 pm.  Lunch from 1:00 pm- 2:00 pm You will need to be fasting.   You may also go to any of these [facility] locations:  [employer]  - [address] [employer])          If you have labs (blood work) drawn today and your tests are completely normal, you will receive your results only by: [employer] (if you have MyChart) OR A paper copy in the mail If you have any lab test that is abnormal or we need to change your treatment, we will call you to review the results.   Testing/Procedures: None ordered   Follow-Up: At [facility], you and your health needs are our priority.  As part of our continuing mission to provide you with exceptional heart care, we have created designated Provider Care Teams.  These Care Teams include your primary Cardiologist (physician) and Advanced Practice Providers (APPs -  Physician Assistants and Nurse Practitioners) who all work together to provide  you with the care you need, when you need it.  We recommend signing up for the patient portal called "MyChart".  Sign up information is provided on this After Visit Summary.  MyChart is used to connect with patients for Virtual Visits (Telemedicine).  Patients are able to view lab/test results, encounter notes, upcoming appointments, etc.  Non-urgent messages can be sent to your provider as well.   To learn more about what you can do with MyChart, go to [email or web address].    Your next appointment:   2 month(s)  Provider:   You may see Lorine Bears, MD or one of the following Advanced Practice Providers on your designated Care Team:   Nicolasa Ducking, NP Eula Listen, PA-C Cadence Fransico Michael, PA-C Charlsie Quest, NP

## 2023-03-08 ENCOUNTER — Telehealth: Payer: Self-pay | Admitting: Cardiology

## 2023-03-08 LAB — BASIC METABOLIC PANEL
BUN/Creatinine Ratio: 11 — ABNORMAL LOW (ref 12–28)
BUN: 12 mg/dL (ref 8–27)
CO2: 23 mmol/L (ref 20–29)
Calcium: 9 mg/dL (ref 8.7–10.3)
Chloride: 104 mmol/L (ref 96–106)
Creatinine, Ser: 1.09 mg/dL — ABNORMAL HIGH (ref 0.57–1.00)
Glucose: 96 mg/dL (ref 70–99)
Potassium: 4.1 mmol/L (ref 3.5–5.2)
Sodium: 141 mmol/L (ref 134–144)
eGFR: 54 mL/min/{1.73_m2} — ABNORMAL LOW (ref >59)

## 2023-03-08 NOTE — Telephone Encounter (Signed)
Pt's spouse calling to get a f/u on NP or nurse calling him back regarding her BP. Please advise

## 2023-03-08 NOTE — Telephone Encounter (Signed)
Personally called Karina Freeman and her husband to discuss her elevated blood pressure, they reported an improved blood pressure in the 160's/80's. She has been asymptomatic with elevations in blood pressure earlier today. She took her first dose of Losartan 50 mg this morning. Discussed trying to relax before taking her blood pressure as increased anxiety can cause her blood pressure to rise, to ensure her feet are flat on the floor when taking and to ensure she is not holding her breath while taking her blood pressure. She noted yesterday at her appointment that she hated having her blood pressure taken as it causes increased stress and is uncomfortable when the cuff squeezes her arm. Discussed that if she has consistent elevations in her blood pressure she can take a one time additional dose of Losartan 25 mg daily. Also discussed that trazodone is on her medication list as needed for sleep, recommended she trying taking a dose before bed as she has not been taking. Reviewed ED precautions. Karina Freeman and her husband were very appreciative for the call, all questions were answered.

## 2023-03-08 NOTE — Telephone Encounter (Signed)
New Message:     Patient was told to call back with blood pressure readings today after taking her new dose of Losartan(50 mg).      Pt c/o BP issue: STAT if pt c/o blurred vision, one-sided weakness or slurred speech  1. What are your last 5 BP readings? 11173/101 at 11:00 AM 168/101  at 1:10 PM 173/103 and 161/970 ::  2. Are you having any other symptoms (ex. Dizziness, headache, blurred vision, passed out)?  No symptoms  3. What is your BP issue?  Blood pressure still elevated

## 2023-03-08 NOTE — Telephone Encounter (Signed)
The patient's husband called into let us know how the patient's blood pressure was today after starting the Losartan 50 mg.  Blood pressures today: 11 am 173/101 168/101  1 pm: 173/103 161/97  The patient is asymptomatic. He stated that they are getting nervous with blood pressure readings still running high.   The husband has been advised to keep a log of the pressures 1-2 hours after taking her medication and we would call back with any recommendations.

## 2023-03-11 MED ORDER — AMLODIPINE BESYLATE 2.5 MG PO TABS: 2.5000 mg | ORAL_TABLET | Freq: Every evening | ORAL | 3 refills | Status: DC

## 2023-03-11 NOTE — Telephone Encounter (Signed)
Called patient, spoke with husband.   He states that he just wanted to give Katlyn an update on his wifes blood pressure readings over the weekend. They are taking the Losartan 50 mg in the morning as instructed. They have not had to take an extra 25 mg Losartan, but the blood pressures are still slightly elevated and he wanted to see if she recommended anything else.   States on Saturday BP readings were 161/90 around 5:00 PM, and around 7:25 PM it was 148/85, yesterday on Sunday in the morning it was 154/89, she rechecked it a little later and it was 148/88, then around 2:30 PM it was 144/70.   Today this morning around 8:00 AM it was 149/89, and then rechecked it a little later it was 140/85. He states it is much better, but still a little elevated. They were just questioning if anything else should be done. Advised I would route a message and we would call back with any recommendations, patient is not having any symptoms to report.

## 2023-03-11 NOTE — Telephone Encounter (Signed)
Patient's husband is following up, requesting to speak directly with Reather Littler, NP again to provide updates if possible.

## 2023-03-11 NOTE — Telephone Encounter (Signed)
Called Karina Freeman to discuss new blood pressure readings, overall improved although still elevated. Will start her on amlodipine 2.5 mg nightly which she is agreeable to. She requests that I call her back later this afternoon to review this with her husband as well, I agreed to call back later today.

## 2023-03-11 NOTE — Telephone Encounter (Signed)
Called and discussed with Ms. Heraty and her husband starting on amlodipine 2.5 mg nightly. Both are in agreement with this plan and appreciate the phone call.

## 2023-03-12 ENCOUNTER — Ambulatory Visit: Payer: Medicare Other | Admitting: Internal Medicine

## 2023-03-12 ENCOUNTER — Other Ambulatory Visit: Payer: Self-pay

## 2023-03-12 ENCOUNTER — Telehealth: Payer: Self-pay | Admitting: Cardiovascular Disease

## 2023-03-12 ENCOUNTER — Other Ambulatory Visit: Payer: Self-pay | Admitting: *Deleted

## 2023-03-12 MED ORDER — LOSARTAN POTASSIUM 50 MG PO TABS: 50.0000 mg | ORAL_TABLET | Freq: Every day | ORAL | 0 refills | Status: DC

## 2023-03-12 MED ORDER — AMLODIPINE BESYLATE 2.5 MG PO TABS: 2.5000 mg | ORAL_TABLET | Freq: Every evening | ORAL | 3 refills | Status: DC

## 2023-03-12 NOTE — Telephone Encounter (Signed)
Disp Refills Start End   losartan (COZAAR) 50 MG tablet 90 tablet 0 03/12/2023 --   Sig - Route: Take 1 tablet (50 mg total) by mouth daily. - Oral   Sent to pharmacy as: losartan (COZAAR) 50 MG tablet   E-Prescribing Status: Receipt confirmed by pharmacy (03/12/2023  2:42 PM EDT)    Pharmacy  [id number] - [place], [place] - [address] AT [facility] [address] [facility]

## 2023-03-12 NOTE — Telephone Encounter (Signed)
I spoke with pharmacy concerning refill. Pharmacist mentioned that there is no issues with pt getting amlodipine filled and that they are currently working on that refill. I contacted [facility] to cancel original rx sent to them. No further actions needed at this time.

## 2023-03-12 NOTE — Telephone Encounter (Signed)
*  STAT* If patient is at the pharmacy, call can be transferred to refill team.   1. Which medications need to be refilled? (please list name of each medication and dose if known)   amLODipine (NORVASC) 2.5 MG tablet   2. Would you like to learn more about the convenience, safety, & potential cost savings by using the [facility]?   3. Are you open to using the [facility] (Type [facility]. ).  4. Which pharmacy/location (including street and city if local pharmacy) is medication to be sent to?  [id number] - [place], [place] - [address] AT [facility] OF [address]   5. Do they need a 30 day or 90 day supply?   90 day  Caller (Joy) did not know if the patient still had medication left.

## 2023-03-12 NOTE — Telephone Encounter (Signed)
Spoke with pt to make her aware of no issues and that pharmacy with working on refills.

## 2023-03-12 NOTE — Telephone Encounter (Signed)
DUPLICATE ENCOUNTER.    Please see other encounter.

## 2023-03-12 NOTE — Telephone Encounter (Signed)
Called patient husband, advised that they just needed the Amlodipine 2.5 mg sent to the correct pharmacy. Sent it over, as they got it straight with the insurance company.   Patient husband verbalized understanding.

## 2023-03-12 NOTE — Telephone Encounter (Signed)
Disp Refills Start End   amLODipine (NORVASC) 2.5 MG tablet  90 tablet 3 03/11/2023 03/12/2023   Sig - Route: Take 1 tablet (2.5 mg total) by mouth at bedtime. - Oral   Sent to pharmacy as: amLODipine (NORVASC) 2.5 MG tablet   Reason for Discontinue: Reorder   E-Prescribing Status: Receipt confirmed by pharmacy (03/11/2023  5:22 PM EDT)    Pharmacy  [id number] - [place], [place] - [address] AT [facility] OF [address] [facility]

## 2023-03-12 NOTE — Telephone Encounter (Signed)
Pt c/o medication issue:  1. Name of Medication: amLODipine (NORVASC) 2.5 MG tablet   2. How are you currently taking this medication (dosage and times per day)? Take 1 tablet (2.5 mg total) by mouth at bedtime.   3. Are you having a reaction (difficulty breathing--STAT)? No  4. What is your medication issue? Patient's spouse is calling because they are having issues with getting this medication. Patient's spouse stated this is a new medication, but the pharmacy states it is counted as a refill and it would be too early for the patient to refill the medication. Patient's spouse is requesting an update. Please advise.

## 2023-03-14 DIAGNOSIS — Z79899 Other long term (current) drug therapy: Secondary | ICD-10-CM | POA: Diagnosis not present

## 2023-03-14 NOTE — Telephone Encounter (Signed)
Patient stopped in states BP is still elevated please call to discuss

## 2023-03-15 LAB — HEPATIC FUNCTION PANEL
ALT: 14 IU/L (ref 0–32)
AST: 14 IU/L (ref 0–40)
Albumin: 4.2 g/dL (ref 3.8–4.8)
Alkaline Phosphatase: 88 IU/L (ref 44–121)
Bilirubin Total: 0.7 mg/dL (ref 0.0–1.2)
Bilirubin, Direct: 0.21 mg/dL (ref 0.00–0.40)
Total Protein: 6.8 g/dL (ref 6.0–8.5)

## 2023-03-15 LAB — BASIC METABOLIC PANEL
BUN/Creatinine Ratio: 12 (ref 12–28)
BUN: 13 mg/dL (ref 8–27)
CO2: 25 mmol/L (ref 20–29)
Calcium: 8.9 mg/dL (ref 8.7–10.3)
Chloride: 104 mmol/L (ref 96–106)
Creatinine, Ser: 1.11 mg/dL — ABNORMAL HIGH (ref 0.57–1.00)
Glucose: 99 mg/dL (ref 70–99)
Potassium: 4.6 mmol/L (ref 3.5–5.2)
Sodium: 142 mmol/L (ref 134–144)
eGFR: 53 mL/min/{1.73_m2} — ABNORMAL LOW (ref >59)

## 2023-03-15 LAB — LIPID PANEL
Chol/HDL Ratio: 2.4 ratio (ref 0.0–4.4)
Cholesterol, Total: 125 mg/dL (ref 100–199)
HDL: 52 mg/dL (ref >39)
LDL Chol Calc (NIH): 56 mg/dL (ref 0–99)
Triglycerides: 86 mg/dL (ref 0–149)
VLDL Cholesterol Cal: 17 mg/dL (ref 5–40)

## 2023-03-15 MED ORDER — AMLODIPINE BESYLATE 5 MG PO TABS: 5.0000 mg | ORAL_TABLET | Freq: Every evening | ORAL | 1 refills | Status: DC

## 2023-03-15 NOTE — Telephone Encounter (Signed)
Called Ms. Top and her husband to discuss report of elevated blood pressure when they came to the office for labwork. Her blood pressure has been elevated the last three days in the 150's/80's. Will increase her amlodipine to 5 mg nightly. Reviewed lab work, kidney function stable and electrolytes are normal following increase in Losartan. Lipid profile shows LDL of 56, LFTs normal.

## 2023-03-19 ENCOUNTER — Other Ambulatory Visit: Payer: Self-pay

## 2023-03-19 DIAGNOSIS — I2510 Atherosclerotic heart disease of native coronary artery without angina pectoris: Secondary | ICD-10-CM

## 2023-03-19 DIAGNOSIS — E782 Mixed hyperlipidemia: Secondary | ICD-10-CM

## 2023-03-25 ENCOUNTER — Encounter: Payer: Medicare Other | Attending: Cardiovascular Disease

## 2023-03-25 ENCOUNTER — Other Ambulatory Visit: Payer: Self-pay

## 2023-03-25 DIAGNOSIS — I213 ST elevation (STEMI) myocardial infarction of unspecified site: Secondary | ICD-10-CM

## 2023-03-25 DIAGNOSIS — Z955 Presence of coronary angioplasty implant and graft: Secondary | ICD-10-CM | POA: Insufficient documentation

## 2023-03-25 DIAGNOSIS — I252 Old myocardial infarction: Secondary | ICD-10-CM | POA: Insufficient documentation

## 2023-03-25 DIAGNOSIS — Z48812 Encounter for surgical aftercare following surgery on the circulatory system: Secondary | ICD-10-CM | POA: Insufficient documentation

## 2023-03-25 NOTE — Progress Notes (Signed)
Virtual Visit completed. Patient informed on EP and RD appointment and 6 Minute walk test. Patient also informed of patient health questionnaires on My Chart. Patient Verbalizes understanding. Visit diagnosis can be found in [facility] 02/15/2023.

## 2023-03-26 ENCOUNTER — Encounter: Payer: Self-pay | Admitting: Internal Medicine

## 2023-03-26 ENCOUNTER — Ambulatory Visit (INDEPENDENT_AMBULATORY_CARE_PROVIDER_SITE_OTHER): Payer: Medicare Other | Admitting: Internal Medicine

## 2023-03-26 VITALS — BP 122/75 | HR 69 | Ht 67.5 in | Wt 158.0 lb

## 2023-03-26 DIAGNOSIS — I25110 Atherosclerotic heart disease of native coronary artery with unstable angina pectoris: Secondary | ICD-10-CM

## 2023-03-26 DIAGNOSIS — Z8673 Personal history of transient ischemic attack (TIA), and cerebral infarction without residual deficits: Secondary | ICD-10-CM

## 2023-03-26 DIAGNOSIS — G4700 Insomnia, unspecified: Secondary | ICD-10-CM | POA: Insufficient documentation

## 2023-03-26 DIAGNOSIS — L650 Telogen effluvium: Secondary | ICD-10-CM

## 2023-03-26 DIAGNOSIS — Z1231 Encounter for screening mammogram for malignant neoplasm of breast: Secondary | ICD-10-CM

## 2023-03-26 DIAGNOSIS — Z23 Encounter for immunization: Secondary | ICD-10-CM

## 2023-03-26 NOTE — Assessment & Plan Note (Signed)
Tried one dose of Trazodone. Recommend nightly dosing for 3-4 weeks unless SE Due to recent MI she is having more anxiety as well.

## 2023-03-26 NOTE — Patient Instructions (Addendum)
Start a Skin/hair/nails multivitamin  Take Trazodone 25 mg every evening - best just to take 15 minutes before bedtime.  Call [facility] to schedule your mammogram at [phone].

## 2023-03-26 NOTE — Assessment & Plan Note (Signed)
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

## 2023-03-26 NOTE — Progress Notes (Signed)
Date:  03/26/2023   Name:  Karina Freeman   DOB:  02-21-1951   MRN:  [id number]   Chief Complaint: New Patient (Initial Visit) (Patient is here today with her husband [name]. ), Insomnia (Tried trazodone and this does not help patient sleeping. ), and Alopecia (Hair is falling out more frequently than she is used to. X 1 year.)  Insomnia Primary symptoms: sleep disturbance, difficulty falling asleep, premature morning awakening.   The current episode started more than one month. The onset quality is undetermined.  Hair thinning - over the past few months, no bald spots or scalp lesions. Not taking any supplements. CAD - had MI and stent in 02/2023.  Doing well except for anxiety and poor sleep.  Planning to start Cardiac rehab soon.  Taking medications as directed - her husband is managing these.  Lab Results  Component Value Date   NA 142 03/14/2023   K 4.6 03/14/2023   CO2 25 03/14/2023   GLUCOSE 99 03/14/2023   BUN 13 03/14/2023   CREATININE 1.11 (H) 03/14/2023   CALCIUM 8.9 03/14/2023   EGFR 53 (L) 03/14/2023   GFRNONAA 43 (L) 02/28/2023   Lab Results  Component Value Date   CHOL 125 03/14/2023   HDL 52 03/14/2023   LDLCALC 56 03/14/2023   TRIG 86 03/14/2023   CHOLHDL 2.4 03/14/2023   No results found for: "TSH" Lab Results  Component Value Date   HGBA1C 5.4 02/15/2023   Lab Results  Component Value Date   WBC 7.6 02/22/2023   HGB 12.1 02/22/2023   HCT 36.0 02/22/2023   MCV 91.6 02/22/2023   PLT 259 02/22/2023   Lab Results  Component Value Date   ALT 14 03/14/2023   AST 14 03/14/2023   ALKPHOS 88 03/14/2023   BILITOT 0.7 03/14/2023   No results found for: "25OHVITD2", "25OHVITD3", "VD25OH"   Review of Systems  Constitutional:  Negative for fatigue and unexpected weight change.  HENT:  Negative for nosebleeds.   Eyes:  Negative for visual disturbance.  Respiratory:  Negative for cough, chest tightness, shortness of breath and wheezing.    Cardiovascular:  Negative for chest pain, palpitations and leg swelling.  Gastrointestinal:  Negative for abdominal pain, constipation and diarrhea.  Musculoskeletal:  Negative for arthralgias and gait problem.  Neurological:  Negative for dizziness, weakness, light-headedness and headaches.  Psychiatric/Behavioral:  Positive for sleep disturbance. Negative for decreased concentration and dysphoric mood. The patient is nervous/anxious and has insomnia.     Patient Active Problem List   Diagnosis Date Noted   Insomnia 03/26/2023   Essential hypertension 02/21/2023   Coronary artery disease involving native coronary artery of native heart with unstable angina pectoris (HCC) 02/21/2023   Ischemic cardiomyopathy 02/16/2023   Chronic kidney disease, stage 3 (HCC) 06/25/2016   History of stroke 02/10/2015   Osteoporosis 02/10/2015   Depression 02/10/2015   Hyperlipidemia 02/10/2015   GERD (gastroesophageal reflux disease) 02/10/2015   Toenail fungus 02/10/2015    Allergies  Allergen Reactions   Citalopram Hydrobromide Nausea And Vomiting   Codeine Diarrhea and Nausea And Vomiting   Penicillin G Benzathine     Past Surgical History:  Procedure Laterality Date   APPENDECTOMY     CORONARY IMAGING/OCT N/A 02/21/2023   Procedure: CORONARY IMAGING/OCT;  Surgeon: Yvonne Kendall, MD;  Location: [facility];  Service: Cardiovascular;  Laterality: N/A;   CORONARY PRESSURE/FFR STUDY N/A 02/21/2023   Procedure: CORONARY PRESSURE/FFR STUDY;  Surgeon: Yvonne Kendall,  MD;  Location: [facility] CV [facility];  Service: Cardiovascular;  Laterality: N/A;   CORONARY STENT INTERVENTION N/A 02/21/2023   Procedure: CORONARY STENT INTERVENTION;  Surgeon: Yvonne Kendall, MD;  Location: [facility];  Service: Cardiovascular;  Laterality: N/A;   CORONARY/GRAFT ACUTE MI REVASCULARIZATION N/A 02/15/2023   Procedure: Coronary/Graft Acute MI Revascularization;  Surgeon: Iran Ouch, MD;   Location: [facility];  Service: Cardiovascular;  Laterality: N/A;   LEFT HEART CATH AND CORONARY ANGIOGRAPHY N/A 02/15/2023   Procedure: LEFT HEART CATH AND CORONARY ANGIOGRAPHY;  Surgeon: Iran Ouch, MD;  Location: [facility];  Service: Cardiovascular;  Laterality: N/A;   LEFT HEART CATH AND CORONARY ANGIOGRAPHY N/A 02/21/2023   Procedure: LEFT HEART CATH AND CORONARY ANGIOGRAPHY;  Surgeon: Yvonne Kendall, MD;  Location: [facility];  Service: Cardiovascular;  Laterality: N/A;   TONSILLECTOMY     TUBAL LIGATION      Social History   Tobacco Use   Smoking status: Former    Current packs/day: 0.00    Types: Cigarettes    Quit date: 07/16/1973    Years since quitting: 49.7   Smokeless tobacco: Never  Vaping Use   Vaping status: Never Used  Substance Use Topics   Alcohol use: No    Alcohol/week: 0.0 standard drinks of alcohol   Drug use: No     Medication list has been reviewed and updated.  Current Meds  Medication Sig   amLODipine (NORVASC) 5 MG tablet Take 1 tablet (5 mg total) by mouth at bedtime.   aspirin 81 MG chewable tablet Chew 1 tablet (81 mg total) by mouth daily.   atorvastatin (LIPITOR) 80 MG tablet Take 1 tablet (80 mg total) by mouth daily.   carvedilol (COREG) 12.5 MG tablet Take 1 tablet (12.5 mg total) by mouth 2 (two) times daily with a meal.   losartan (COZAAR) 50 MG tablet Take 1 tablet (50 mg total) by mouth daily.   nitroGLYCERIN (NITROSTAT) 0.4 MG SL tablet Place 1 tablet (0.4 mg total) under the tongue every 5 (five) minutes as needed for chest pain.   ticagrelor (BRILINTA) 90 MG TABS tablet Take 1 tablet (90 mg total) by mouth 2 (two) times daily.       03/26/2023    1:59 PM  GAD 7 : Generalized Anxiety Score  Nervous, Anxious, on Edge 0  Control/stop worrying 1  Worry too much - different things 1  Trouble relaxing 0  Restless 1  Easily annoyed or irritable 1  Afraid - awful might happen 0  Total GAD 7 Score 4   Anxiety Difficulty Not difficult at all       03/26/2023    1:59 PM 11/11/2015   10:11 AM  Depression screen PHQ 2/9  Decreased Interest 1 0  Down, Depressed, Hopeless 0 0  PHQ - 2 Score 1 0  Altered sleeping 1   Tired, decreased energy 1   Change in appetite 0   Feeling bad or failure about yourself  0   Trouble concentrating 1   Moving slowly or fidgety/restless 1   Suicidal thoughts 0   PHQ-9 Score 5   Difficult doing work/chores Not difficult at all     BP Readings from Last 3 Encounters:  03/26/23 122/75  03/07/23 (!) 152/78  02/22/23 (!) 150/77    Physical Exam Vitals and nursing note reviewed.  Constitutional:      General: She is not in acute distress.  Appearance: Normal appearance. She is well-developed.  HENT:     Head: Normocephalic and atraumatic. Hair is normal.  Neck:     Thyroid: No thyromegaly.     Vascular: No carotid bruit or JVD.  Cardiovascular:     Rate and Rhythm: Normal rate and regular rhythm.     Pulses: Normal pulses.     Heart sounds: No murmur heard. Pulmonary:     Effort: Pulmonary effort is normal. No respiratory distress.     Breath sounds: Normal breath sounds. No wheezing or rhonchi.  Musculoskeletal:     Cervical back: Normal range of motion.     Right lower leg: No edema.  Lymphadenopathy:     Cervical: No cervical adenopathy.  Skin:    General: Skin is warm and dry.     Findings: No rash.  Neurological:     Mental Status: She is alert and oriented to person, place, and time.  Psychiatric:        Attention and Perception: Attention normal.        Mood and Affect: Mood is anxious.        Speech: Speech normal.        Behavior: Behavior normal.     Wt Readings from Last 3 Encounters:  03/26/23 158 lb (71.7 kg)  03/07/23 162 lb (73.5 kg)  02/21/23 163 lb (73.9 kg)    BP 122/75 (BP Location: Left Arm, Cuff Size: Normal)   Pulse 69   Ht 5' 7.5" (1.715 m)   Wt 158 lb (71.7 kg)   LMP  (LMP Unknown)   SpO2 98%    BMI 24.38 kg/m   Assessment and Plan:  Problem List Items Addressed This Visit       Unprioritized   Insomnia - Primary    Tried one dose of Trazodone. Recommend nightly dosing for 3-4 weeks unless SE Due to recent MI she is having more anxiety as well.      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    Reubin Milan, MD [facility] Sports Medicine [place]

## 2023-03-27 LAB — TSH+FREE T4
Free T4: 1.57 ng/dL (ref 0.82–1.77)
TSH: 1.05 u[IU]/mL (ref 0.450–4.500)

## 2023-04-01 ENCOUNTER — Ambulatory Visit: Payer: Medicare Other

## 2023-04-04 VITALS — Ht 67.5 in | Wt 154.4 lb

## 2023-04-04 DIAGNOSIS — I252 Old myocardial infarction: Secondary | ICD-10-CM | POA: Diagnosis not present

## 2023-04-04 DIAGNOSIS — I213 ST elevation (STEMI) myocardial infarction of unspecified site: Secondary | ICD-10-CM

## 2023-04-04 DIAGNOSIS — Z955 Presence of coronary angioplasty implant and graft: Secondary | ICD-10-CM | POA: Diagnosis not present

## 2023-04-04 DIAGNOSIS — Z48812 Encounter for surgical aftercare following surgery on the circulatory system: Secondary | ICD-10-CM | POA: Diagnosis not present

## 2023-04-04 NOTE — Patient Instructions (Signed)
Patient Instructions  Patient Details  Name: Karina Freeman MRN: [id number] Date of Birth: 1951-02-03 Referring Provider:  Iran Ouch, MD  Below are your personal goals for exercise, nutrition, and risk factors. Our goal is to help you stay on track towards obtaining and maintaining these goals. We will be discussing your progress on these goals with you throughout the program.  Initial Exercise Prescription:  Initial Exercise Prescription - 04/04/23 1500       Date of Initial Exercise RX and Referring Provider   Date 04/04/23    Referring Provider Dr. Lorine Bears, MD      Oxygen   Maintain Oxygen Saturation 88% or higher      Treadmill   MPH 1.6    Grade 0    Minutes 15    METs 2.23      NuStep   Level 2    SPM 80    Minutes 15    METs 2.47      Biostep-RELP   Level 1    SPM 50    Minutes 15    METs 2.47      Track   Laps 25    Minutes 15    METs 2.36      Prescription Details   Frequency (times per week) 2    Duration Progress to 30 minutes of continuous aerobic without signs/symptoms of physical distress      Intensity   THRR 40-80% of Max Heartrate 98-131    Ratings of Perceived Exertion 11-13    Perceived Dyspnea 0-4      Progression   Progression Continue to progress workloads to maintain intensity without signs/symptoms of physical distress.      Resistance Training   Training Prescription Yes    Weight 2 lb    Reps 10-15             Exercise Goals: Frequency: Be able to perform aerobic exercise two to three times per week in program working toward 2-5 days per week of home exercise.  Intensity: Work with a perceived exertion of 11 (fairly light) - 15 (hard) while following your exercise prescription.  We will make changes to your prescription with you as you progress through the program.   Duration: Be able to do 30 to 45 minutes of continuous aerobic exercise in addition to a 5 minute warm-up and a 5 minute cool-down  routine.   Nutrition Goals: Your personal nutrition goals will be established when you do your nutrition analysis with the dietician.  The following are general nutrition guidelines to follow: Cholesterol < 200mg /day Sodium < 1500mg /day Fiber: Women over 50 yrs - 21 grams per day  Personal Goals:  Personal Goals and Risk Factors at Admission - 03/25/23 1110       Core Components/Risk Factors/Patient Goals on Admission    Weight Management Yes;Weight Maintenance    Intervention Weight Management: Develop a combined nutrition and exercise program designed to reach desired caloric intake, while maintaining appropriate intake of nutrient and fiber, sodium and fats, and appropriate energy expenditure required for the weight goal.;Weight Management: Provide education and appropriate resources to help participant work on and attain dietary goals.;Weight Management/Obesity: Establish reasonable short term and long term weight goals.    Expected Outcomes Short Term: Continue to assess and modify interventions until short term weight is achieved;Long Term: Adherence to nutrition and physical activity/exercise program aimed toward attainment of established weight goal;Weight Maintenance: Understanding of the daily nutrition guidelines, which includes  25-35% calories from fat, 7% or less cal from saturated fats, less than 200mg  cholesterol, less than 1.5gm of sodium, & 5 or more servings of fruits and vegetables daily;Understanding recommendations for meals to include 15-35% energy as protein, 25-35% energy from fat, 35-60% energy from carbohydrates, less than 200mg  of dietary cholesterol, 20-35 gm of total fiber daily;Understanding of distribution of calorie intake throughout the day with the consumption of 4-5 meals/snacks    Heart Failure Yes    Intervention Provide a combined exercise and nutrition program that is supplemented with education, support and counseling about heart failure. Directed toward  relieving symptoms such as shortness of breath, decreased exercise tolerance, and extremity edema.    Expected Outcomes Improve functional capacity of life;Short term: Attendance in program 2-3 days a week with increased exercise capacity. Reported lower sodium intake. Reported increased fruit and vegetable intake. Reports medication compliance.;Short term: Daily weights obtained and reported for increase. Utilizing diuretic protocols set by physician.;Long term: Adoption of self-care skills and reduction of barriers for early signs and symptoms recognition and intervention leading to self-care maintenance.    Hypertension Yes    Intervention Provide education on lifestyle modifcations including regular physical activity/exercise, weight management, moderate sodium restriction and increased consumption of fresh fruit, vegetables, and low fat dairy, alcohol moderation, and smoking cessation.;Monitor prescription use compliance.    Expected Outcomes Short Term: Continued assessment and intervention until BP is < 140/61mm HG in hypertensive participants. < 130/73mm HG in hypertensive participants with diabetes, heart failure or chronic kidney disease.;Long Term: Maintenance of blood pressure at goal levels.    Lipids Yes    Intervention Provide education and support for participant on nutrition & aerobic/resistive exercise along with prescribed medications to achieve LDL 70mg , HDL >40mg .    Expected Outcomes Short Term: Participant states understanding of desired cholesterol values and is compliant with medications prescribed. Participant is following exercise prescription and nutrition guidelines.;Long Term: Cholesterol controlled with medications as prescribed, with individualized exercise RX and with personalized nutrition plan. Value goals: LDL < 70mg , HDL > 40 mg.            Exercise Goals and Review:  Exercise Goals     Row Name 04/04/23 1541             Exercise Goals   Increase Physical  Activity Yes       Intervention Provide advice, education, support and counseling about physical activity/exercise needs.;Develop an individualized exercise prescription for aerobic and resistive training based on initial evaluation findings, risk stratification, comorbidities and participant's personal goals.       Expected Outcomes Short Term: Attend rehab on a regular basis to increase amount of physical activity.;Long Term: Exercising regularly at least 3-5 days a week.;Long Term: Add in home exercise to make exercise part of routine and to increase amount of physical activity.       Increase Strength and Stamina Yes       Intervention Provide advice, education, support and counseling about physical activity/exercise needs.;Develop an individualized exercise prescription for aerobic and resistive training based on initial evaluation findings, risk stratification, comorbidities and participant's personal goals.       Expected Outcomes Short Term: Increase workloads from initial exercise prescription for resistance, speed, and METs.;Short Term: Perform resistance training exercises routinely during rehab and add in resistance training at home;Long Term: Improve cardiorespiratory fitness, muscular endurance and strength as measured by increased METs and functional capacity ( )       Able to understand and  use rate of perceived exertion (RPE) scale Yes       Intervention Provide education and explanation on how to use RPE scale       Expected Outcomes Short Term: Able to use RPE daily in rehab to express subjective intensity level;Long Term:  Able to use RPE to guide intensity level when exercising independently       Able to understand and use Dyspnea scale Yes       Intervention Provide education and explanation on how to use Dyspnea scale       Expected Outcomes Long Term: Able to use Dyspnea scale to guide intensity level when exercising independently;Short Term: Able to use Dyspnea scale daily in  rehab to express subjective sense of shortness of breath during exertion       Knowledge and understanding of Target Heart Rate Range (THRR) Yes       Intervention Provide education and explanation of THRR including how the numbers were predicted and where they are located for reference       Expected Outcomes Short Term: Able to state/look up THRR;Long Term: Able to use THRR to govern intensity when exercising independently;Short Term: Able to use daily as guideline for intensity in rehab       Able to check pulse independently Yes       Intervention Provide education and demonstration on how to check pulse in carotid and radial arteries.;Review the importance of being able to check your own pulse for safety during independent exercise       Expected Outcomes Short Term: Able to explain why pulse checking is important during independent exercise;Long Term: Able to check pulse independently and accurately       Understanding of Exercise Prescription Yes       Intervention Provide education, explanation, and written materials on patient's individual exercise prescription       Expected Outcomes Short Term: Able to explain program exercise prescription;Long Term: Able to explain home exercise prescription to exercise independently

## 2023-04-04 NOTE — Progress Notes (Signed)
Cardiac Individual Treatment Plan  Patient Details  Name: Karina Freeman MRN: [id number] Date of Birth: [birth year] Referring Provider:   Flowsheet Row Cardiac Rehab from 04/04/2023 in [facility] Cardiac and Pulmonary Rehab  Referring Provider Dr. Lorine Bears, MD       Initial Encounter Date:  Flowsheet Row Cardiac Rehab from 04/04/2023 in [facility] Cardiac and Pulmonary Rehab  Date 04/04/23       Visit Diagnosis: ST elevation myocardial infarction (STEMI), unspecified artery [facility])  Status post coronary artery stent placement  Patient's Home Medications on Admission:  Current Outpatient Medications:    amLODipine (NORVASC) 5 MG tablet, Take 1 tablet (5 mg total) by mouth at bedtime., Disp: 90 tablet, Rfl: 1   aspirin 81 MG chewable tablet, Chew 1 tablet (81 mg total) by mouth daily., Disp: 90 tablet, Rfl: 3   atorvastatin (LIPITOR) 80 MG tablet, Take 1 tablet (80 mg total) by mouth daily., Disp: 90 tablet, Rfl: 3   carvedilol (COREG) 12.5 MG tablet, Take 1 tablet (12.5 mg total) by mouth 2 (two) times daily with a meal., Disp: 180 tablet, Rfl: 3   losartan (COZAAR) 50 MG tablet, Take 1 tablet (50 mg total) by mouth daily., Disp: 90 tablet, Rfl: 0   nitroGLYCERIN (NITROSTAT) 0.4 MG SL tablet, Place 1 tablet (0.4 mg total) under the tongue every 5 (five) minutes as needed for chest pain., Disp: 100 tablet, Rfl: 3   ticagrelor (BRILINTA) 90 MG TABS tablet, Take 1 tablet (90 mg total) by mouth 2 (two) times daily., Disp: 60 tablet, Rfl: 11  Past Medical History: Past Medical History:  Diagnosis Date   Abnormal Pap smear of cervix 11/11/2015   Acute HFrEF (heart failure with reduced ejection fraction) (HCC) 02/22/2023   Acute ST elevation myocardial infarction (STEMI) of anterior wall (HCC) 02/15/2023   Anxiety    CVA (cerebral infarction)    Depressive disorder    GERD (gastroesophageal reflux disease)    Hyperlipidemia    Hypertension    IFG (impaired fasting glucose)     Osteoporosis    ST elevation myocardial infarction (STEMI) (HCC) 02/17/2023    Tobacco Use: Social History   Tobacco Use  Smoking Status Former   Current packs/day: 0.00   Types: Cigarettes   Quit date: 07/16/1973   Years since quitting: 49.7  Smokeless Tobacco Never    Labs: Review Flowsheet  More data may exist      Latest Ref Rng & Units 04/26/2015 11/11/2015 06/25/2016 02/15/2023 03/14/2023  Labs for ITP Cardiac and Pulmonary Rehab  Cholestrol 100 - 199 mg/dL 629  528  413  244  010   LDL (calc) 0 - 99 mg/dL - 74  92  272  56   HDL-C >39 mg/dL - 61  63  60  52   Trlycerides 0 - 149 mg/dL 95  536  644  84  86   Hemoglobin A1c 4.8 - 5.6 % 5.6  - - 5.4  -    Details             Exercise Target Goals: Exercise Program Goal: Individual exercise prescription set using results from initial 6 min walk test and THRR while considering  patient's activity barriers and safety.   Exercise Prescription Goal: Initial exercise prescription builds to 30-45 minutes a day of aerobic activity, 2-3 days per week.  Home exercise guidelines will be given to patient during program as part of exercise prescription that the participant will acknowledge.   Education: Aerobic  Exercise: - Group verbal and visual presentation on the components of exercise prescription. Introduces F.I.T.T principle from ACSM for exercise prescriptions.  Reviews F.I.T.T. principles of aerobic exercise including progression. Written material given at graduation. Flowsheet Row Cardiac Rehab from 04/04/2023 in [facility] Cardiac and Pulmonary Rehab  Education need identified 04/04/23       Education: Resistance Exercise: - Group verbal and visual presentation on the components of exercise prescription. Introduces F.I.T.T principle from ACSM for exercise prescriptions  Reviews F.I.T.T. principles of resistance exercise including progression. Written material given at graduation.    Education: Exercise & Equipment  Safety: - Individual verbal instruction and demonstration of equipment use and safety with use of the equipment. Flowsheet Row Cardiac Rehab from 04/04/2023 in [facility] Cardiac and Pulmonary Rehab  Date 03/25/23  Educator [facility]  Instruction Review Code 1- Verbalizes Understanding       Education: Exercise Physiology & General Exercise Guidelines: - Group verbal and written instruction with models to review the exercise physiology of the cardiovascular system and associated critical values. Provides general exercise guidelines with specific guidelines to those with heart or lung disease.    Education: Flexibility, Balance, Mind/Body Relaxation: - Group verbal and visual presentation with interactive activity on the components of exercise prescription. Introduces F.I.T.T principle from ACSM for exercise prescriptions. Reviews F.I.T.T. principles of flexibility and balance exercise training including progression. Also discusses the mind body connection.  Reviews various relaxation techniques to help reduce and manage stress (i.e. Deep breathing, progressive muscle relaxation, and visualization). Balance handout provided to take home. Written material given at graduation.   Activity Barriers & Risk Stratification:  Activity Barriers & Cardiac Risk Stratification - 04/04/23 1541       Activity Barriers & Cardiac Risk Stratification   Activity Barriers Other (comment)    Comments osteoporosis    Cardiac Risk Stratification High             6 Minute Walk:  6 Minute Walk     Row Name 04/04/23 1539         6 Minute Walk   Phase Initial     Distance 970 feet     Walk Time 6 minutes     # of Rest Breaks 0     MPH 1.84     METS 2.47     RPE 13     Perceived Dyspnea  1     VO2 Peak 8.66     Symptoms No     Resting HR 65 bpm     Resting BP 132/76     Resting Oxygen Saturation  99 %     Exercise Oxygen Saturation  during 6 min walk 98 %     Max Ex. HR 93 bpm     Max Ex. BP 132/76      2 Minute Post BP 130/72              Oxygen Initial Assessment:   Oxygen Re-Evaluation:   Oxygen Discharge (Final Oxygen Re-Evaluation):   Initial Exercise Prescription:  Initial Exercise Prescription - 04/04/23 1500       Date of Initial Exercise RX and Referring Provider   Date 04/04/23    Referring Provider Dr. Lorine Bears, MD      Oxygen   Maintain Oxygen Saturation 88% or higher      Treadmill   MPH 1.6    Grade 0    Minutes 15    METs 2.23      NuStep  Level 2    SPM 80    Minutes 15    METs 2.47      Biostep-RELP   Level 1    SPM 50    Minutes 15    METs 2.47      Track   Laps 25    Minutes 15    METs 2.36      Prescription Details   Frequency (times per week) 2    Duration Progress to 30 minutes of continuous aerobic without signs/symptoms of physical distress      Intensity   THRR 40-80% of Max Heartrate 98-131    Ratings of Perceived Exertion 11-13    Perceived Dyspnea 0-4      Progression   Progression Continue to progress workloads to maintain intensity without signs/symptoms of physical distress.      Resistance Training   Training Prescription Yes    Weight 2 lb    Reps 10-15             Perform Capillary Blood Glucose checks as needed.  Exercise Prescription Changes:   Exercise Prescription Changes     Row Name 04/04/23 1500             Response to Exercise   Blood Pressure (Admit) 132/76       Blood Pressure (Exercise) 146/76       Blood Pressure (Exit) 130/72       Heart Rate (Admit) 65 bpm       Heart Rate (Exercise) 93 bpm       Heart Rate (Exit) 68 bpm       Oxygen Saturation (Admit) 99 %       Oxygen Saturation (Exercise) 98 %       Rating of Perceived Exertion (Exercise) 13       Perceived Dyspnea (Exercise) 1       Symptoms none       Comments Results                Exercise Comments:   Exercise Goals and Review:   Exercise Goals     Row Name 04/04/23 1541              Exercise Goals   Increase Physical Activity Yes       Intervention Provide advice, education, support and counseling about physical activity/exercise needs.;Develop an individualized exercise prescription for aerobic and resistive training based on initial evaluation findings, risk stratification, comorbidities and participant's personal goals.       Expected Outcomes Short Term: Attend rehab on a regular basis to increase amount of physical activity.;Long Term: Exercising regularly at least 3-5 days a week.;Long Term: Add in home exercise to make exercise part of routine and to increase amount of physical activity.       Increase Strength and Stamina Yes       Intervention Provide advice, education, support and counseling about physical activity/exercise needs.;Develop an individualized exercise prescription for aerobic and resistive training based on initial evaluation findings, risk stratification, comorbidities and participant's personal goals.       Expected Outcomes Short Term: Increase workloads from initial exercise prescription for resistance, speed, and METs.;Short Term: Perform resistance training exercises routinely during rehab and add in resistance training at home;Long Term: Improve cardiorespiratory fitness, muscular endurance and strength as measured by increased METs and functional capacity ( )       Able to understand and use rate of perceived exertion (RPE) scale Yes  Intervention Provide education and explanation on how to use RPE scale       Expected Outcomes Short Term: Able to use RPE daily in rehab to express subjective intensity level;Long Term:  Able to use RPE to guide intensity level when exercising independently       Able to understand and use Dyspnea scale Yes       Intervention Provide education and explanation on how to use Dyspnea scale       Expected Outcomes Long Term: Able to use Dyspnea scale to guide intensity level when exercising independently;Short  Term: Able to use Dyspnea scale daily in rehab to express subjective sense of shortness of breath during exertion       Knowledge and understanding of Target Heart Rate Range (THRR) Yes       Intervention Provide education and explanation of THRR including how the numbers were predicted and where they are located for reference       Expected Outcomes Short Term: Able to state/look up THRR;Long Term: Able to use THRR to govern intensity when exercising independently;Short Term: Able to use daily as guideline for intensity in rehab       Able to check pulse independently Yes       Intervention Provide education and demonstration on how to check pulse in carotid and radial arteries.;Review the importance of being able to check your own pulse for safety during independent exercise       Expected Outcomes Short Term: Able to explain why pulse checking is important during independent exercise;Long Term: Able to check pulse independently and accurately       Understanding of Exercise Prescription Yes       Intervention Provide education, explanation, and written materials on patient's individual exercise prescription       Expected Outcomes Short Term: Able to explain program exercise prescription;Long Term: Able to explain home exercise prescription to exercise independently                Exercise Goals Re-Evaluation :   Discharge Exercise Prescription (Final Exercise Prescription Changes):  Exercise Prescription Changes - 04/04/23 1500       Response to Exercise   Blood Pressure (Admit) 132/76    Blood Pressure (Exercise) 146/76    Blood Pressure (Exit) 130/72    Heart Rate (Admit) 65 bpm    Heart Rate (Exercise) 93 bpm    Heart Rate (Exit) 68 bpm    Oxygen Saturation (Admit) 99 %    Oxygen Saturation (Exercise) 98 %    Rating of Perceived Exertion (Exercise) 13    Perceived Dyspnea (Exercise) 1    Symptoms none    Comments Results             Nutrition:  Target Goals:  Understanding of nutrition guidelines, daily intake of sodium 1500mg , cholesterol 200mg , calories 30% from fat and 7% or less from saturated fats, daily to have 5 or more servings of fruits and vegetables.  Education: All About Nutrition: -Group instruction provided by verbal, written material, interactive activities, discussions, models, and posters to present general guidelines for heart healthy nutrition including fat, fiber, MyPlate, the role of sodium in heart healthy nutrition, utilization of the nutrition label, and utilization of this knowledge for meal planning. Follow up email sent as well. Written material given at graduation. Flowsheet Row Cardiac Rehab from 04/04/2023 in [facility] Cardiac and Pulmonary Rehab  Education need identified 04/04/23       Biometrics:  Pre Biometrics -  04/04/23 1542       Pre Biometrics   Height 5' 7.5" (1.715 m)    Weight 154 lb 6.4 oz (70 kg)    Waist Circumference 34 inches    Hip Circumference 41 inches    Waist to Hip Ratio 0.83 %    BMI (Calculated) 23.81    Single Leg Stand 7.8 seconds              Nutrition Therapy Plan and Nutrition Goals:  Nutrition Therapy & Goals - 04/04/23 1555       Nutrition Therapy   RD appointment deferred Yes      Intervention Plan   Intervention Prescribe, educate and counsel regarding individualized specific dietary modifications aiming towards targeted core components such as weight, hypertension, lipid management, diabetes, heart failure and other comorbidities.    Expected Outcomes Short Term Goal: Understand basic principles of dietary content, such as calories, fat, sodium, cholesterol and nutrients.;Short Term Goal: A plan has been developed with personal nutrition goals set during dietitian appointment.;Long Term Goal: Adherence to prescribed nutrition plan.             Nutrition Assessments:  MEDIFICTS Score Key: >=70 Need to make dietary changes  40-70 Heart Healthy Diet <= 40  Therapeutic Level Cholesterol Diet  Flowsheet Row Cardiac Rehab from 04/04/2023 in [facility] Cardiac and Pulmonary Rehab  Picture Your Plate Total Score on Admission 72      Picture Your Plate Scores: <40 Unhealthy dietary pattern with much room for improvement. 41-50 Dietary pattern unlikely to meet recommendations for good health and room for improvement. 51-60 More healthful dietary pattern, with some room for improvement.  >60 Healthy dietary pattern, although there may be some specific behaviors that could be improved.    Nutrition Goals Re-Evaluation:   Nutrition Goals Discharge (Final Nutrition Goals Re-Evaluation):   Psychosocial: Target Goals: Acknowledge presence or absence of significant depression and/or stress, maximize coping skills, provide positive support system. Participant is able to verbalize types and ability to use techniques and skills needed for reducing stress and depression.   Education: Stress, Anxiety, and Depression - Group verbal and visual presentation to define topics covered.  Reviews how body is impacted by stress, anxiety, and depression.  Also discusses healthy ways to reduce stress and to treat/manage anxiety and depression.  Written material given at graduation. Flowsheet Row Cardiac Rehab from 04/04/2023 in [facility] Cardiac and Pulmonary Rehab  Education need identified 04/04/23       Education: Sleep Hygiene -Provides group verbal and written instruction about how sleep can affect your health.  Define sleep hygiene, discuss sleep cycles and impact of sleep habits. Review good sleep hygiene tips.    Initial Review & Psychosocial Screening:  Initial Psych Review & Screening - 03/25/23 1110       Initial Review   Current issues with Current Sleep Concerns      Family Dynamics   Good Support System? Yes    Comments Evyenia has trouble sleeping at night worring about things in life. She can look to her husband, daughter and church friends for  support.      Barriers   Psychosocial barriers to participate in program The patient should benefit from training in stress management and relaxation.;There are no identifiable barriers or psychosocial needs.      Screening Interventions   Interventions Encouraged to exercise;To provide support and resources with identified psychosocial needs;Provide feedback about the scores to participant    Expected Outcomes Short Term  goal: Utilizing psychosocial counselor, staff and physician to assist with identification of specific Stressors or current issues interfering with healing process. Setting desired goal for each stressor or current issue identified.;Long Term Goal: Stressors or current issues are controlled or eliminated.;Short Term goal: Identification and review with participant of any Quality of Life or Depression concerns found by scoring the questionnaire.;Long Term goal: The participant improves quality of Life and PHQ9 Scores as seen by post scores and/or verbalization of changes             Quality of Life Scores:   Quality of Life - 04/04/23 1558       Quality of Life   Select Quality of Life      Quality of Life Scores   Health/Function Pre 25.6 %    Socioeconomic Pre 27.88 %    Psych/Spiritual Pre 30 %    Family Pre 27.6 %    GLOBAL Pre 27.29 %            Scores of 19 and below usually indicate a poorer quality of life in these areas.  A difference of  2-3 points is a clinically meaningful difference.  A difference of 2-3 points in the total score of the Quality of Life Index has been associated with significant improvement in overall quality of life, self-image, physical symptoms, and general health in studies assessing change in quality of life.  PHQ-9: Review Flowsheet       04/04/2023 03/26/2023 11/11/2015  Depression screen PHQ 2/9  Decreased Interest 0 1 0  Down, Depressed, Hopeless 0 0 0  PHQ - 2 Score 0 1 0  Altered sleeping 1 1 -  Tired, decreased energy  1 1 -  Change in appetite 0 0 -  Feeling bad or failure about yourself  0 0 -  Trouble concentrating 1 1 -  Moving slowly or fidgety/restless 0 1 -  Suicidal thoughts 0 0 -  PHQ-9 Score 3 5 -  Difficult doing work/chores Not difficult at all Not difficult at all -    Details           Interpretation of Total Score  Total Score Depression Severity:  1-4 = Minimal depression, 5-9 = Mild depression, 10-14 = Moderate depression, 15-19 = Moderately severe depression, 20-27 = Severe depression   Psychosocial Evaluation and Intervention:  Psychosocial Evaluation - 03/25/23 1113       Psychosocial Evaluation & Interventions   Interventions Encouraged to exercise with the program and follow exercise prescription;Relaxation education;Stress management education    Comments Takelia has trouble sleeping at night worring about things in life. She can look to her husband, daughter and church friends for support.    Expected Outcomes Short: Start [facility] to help with mood. Long: Maintain a healthy mental state    Continue Psychosocial Services  Follow up required by staff             Psychosocial Re-Evaluation:   Psychosocial Discharge (Final Psychosocial Re-Evaluation):   Vocational Rehabilitation: Provide vocational rehab assistance to qualifying candidates.   Vocational Rehab Evaluation & Intervention:   Education: Education Goals: Education classes will be provided on a variety of topics geared toward better understanding of heart health and risk factor modification. Participant will state understanding/return demonstration of topics presented as noted by education test scores.  Learning Barriers/Preferences:  Learning Barriers/Preferences - 03/25/23 1110       Learning Barriers/Preferences   Learning Barriers None    Learning Preferences None  General Cardiac Education Topics:  AED/CPR: - Group verbal and written instruction with the use of models  to demonstrate the basic use of the AED with the basic ABC's of resuscitation.   Anatomy and Cardiac Procedures: - Group verbal and visual presentation and models provide information about basic cardiac anatomy and function. Reviews the testing methods done to diagnose heart disease and the outcomes of the test results. Describes the treatment choices: Medical Management, Angioplasty, or Coronary Bypass Surgery for treating various heart conditions including Myocardial Infarction, Angina, Valve Disease, and Cardiac Arrhythmias.  Written material given at graduation.   Medication Safety: - Group verbal and visual instruction to review commonly prescribed medications for heart and lung disease. Reviews the medication, class of the drug, and side effects. Includes the steps to properly store meds and maintain the prescription regimen.  Written material given at graduation.   Intimacy: - Group verbal instruction through game format to discuss how heart and lung disease can affect sexual intimacy. Written material given at graduation..   Know Your Numbers and Heart Failure: - Group verbal and visual instruction to discuss disease risk factors for cardiac and pulmonary disease and treatment options.  Reviews associated critical values for Overweight/Obesity, Hypertension, Cholesterol, and Diabetes.  Discusses basics of heart failure: signs/symptoms and treatments.  Introduces Heart Failure Zone chart for action plan for heart failure.  Written material given at graduation.   Infection Prevention: - Provides verbal and written material to individual with discussion of infection control including proper hand washing and proper equipment cleaning during exercise session. Flowsheet Row Cardiac Rehab from 04/04/2023 in [facility] Cardiac and Pulmonary Rehab  Date 03/25/23  Educator [facility]  Instruction Review Code 1- Verbalizes Understanding       Falls Prevention: - Provides verbal and written material to  individual with discussion of falls prevention and safety. Flowsheet Row Cardiac Rehab from 04/04/2023 in [facility] Cardiac and Pulmonary Rehab  Date 03/25/23  Educator [facility]  Instruction Review Code 1- Verbalizes Understanding       Other: -Provides group and verbal instruction on various topics (see comments)   Knowledge Questionnaire Score:  Knowledge Questionnaire Score - 04/04/23 1552       Knowledge Questionnaire Score   Pre Score 23/26             Core Components/Risk Factors/Patient Goals at Admission:  Personal Goals and Risk Factors at Admission - 03/25/23 1110       Core Components/Risk Factors/Patient Goals on Admission    Weight Management Yes;Weight Maintenance    Intervention Weight Management: Develop a combined nutrition and exercise program designed to reach desired caloric intake, while maintaining appropriate intake of nutrient and fiber, sodium and fats, and appropriate energy expenditure required for the weight goal.;Weight Management: Provide education and appropriate resources to help participant work on and attain dietary goals.;Weight Management/Obesity: Establish reasonable short term and long term weight goals.    Expected Outcomes Short Term: Continue to assess and modify interventions until short term weight is achieved;Long Term: Adherence to nutrition and physical activity/exercise program aimed toward attainment of established weight goal;Weight Maintenance: Understanding of the daily nutrition guidelines, which includes 25-35% calories from fat, 7% or less cal from saturated fats, less than 200mg  cholesterol, less than 1.5gm of sodium, & 5 or more servings of fruits and vegetables daily;Understanding recommendations for meals to include 15-35% energy as protein, 25-35% energy from fat, 35-60% energy from carbohydrates, less than 200mg  of dietary cholesterol, 20-35 gm of total fiber daily;Understanding of  distribution of calorie intake throughout the day with  the consumption of 4-5 meals/snacks    Heart Failure Yes    Intervention Provide a combined exercise and nutrition program that is supplemented with education, support and counseling about heart failure. Directed toward relieving symptoms such as shortness of breath, decreased exercise tolerance, and extremity edema.    Expected Outcomes Improve functional capacity of life;Short term: Attendance in program 2-3 days a week with increased exercise capacity. Reported lower sodium intake. Reported increased fruit and vegetable intake. Reports medication compliance.;Short term: Daily weights obtained and reported for increase. Utilizing diuretic protocols set by physician.;Long term: Adoption of self-care skills and reduction of barriers for early signs and symptoms recognition and intervention leading to self-care maintenance.    Hypertension Yes    Intervention Provide education on lifestyle modifcations including regular physical activity/exercise, weight management, moderate sodium restriction and increased consumption of fresh fruit, vegetables, and low fat dairy, alcohol moderation, and smoking cessation.;Monitor prescription use compliance.    Expected Outcomes Short Term: Continued assessment and intervention until BP is < 140/64mm HG in hypertensive participants. < 130/64mm HG in hypertensive participants with diabetes, heart failure or chronic kidney disease.;Long Term: Maintenance of blood pressure at goal levels.    Lipids Yes    Intervention Provide education and support for participant on nutrition & aerobic/resistive exercise along with prescribed medications to achieve LDL 70mg , HDL >40mg .    Expected Outcomes Short Term: Participant states understanding of desired cholesterol values and is compliant with medications prescribed. Participant is following exercise prescription and nutrition guidelines.;Long Term: Cholesterol controlled with medications as prescribed, with individualized exercise  RX and with personalized nutrition plan. Value goals: LDL < 70mg , HDL > 40 mg.             Education:Diabetes - Individual verbal and written instruction to review signs/symptoms of diabetes, desired ranges of glucose level fasting, after meals and with exercise. Acknowledge that pre and post exercise glucose checks will be done for 3 sessions at entry of program.   Core Components/Risk Factors/Patient Goals Review:    Core Components/Risk Factors/Patient Goals at Discharge (Final Review):    ITP Comments:  ITP Comments     Row Name 03/25/23 1109 04/04/23 1539         ITP Comments Virtual Visit completed. Patient informed on EP and RD appointment and 6 Minute walk test. Patient also informed of patient health questionnaires on My Chart. Patient Verbalizes understanding. Visit diagnosis can be found in [facility] 02/15/2023. Completed and gym orientation. Initial ITP created and sent for review to Dr. Bethann Punches, Medical Director.               Comments: Initial ITP

## 2023-04-08 ENCOUNTER — Encounter: Payer: Medicare Other | Admitting: *Deleted

## 2023-04-08 DIAGNOSIS — I213 ST elevation (STEMI) myocardial infarction of unspecified site: Secondary | ICD-10-CM

## 2023-04-08 DIAGNOSIS — Z955 Presence of coronary angioplasty implant and graft: Secondary | ICD-10-CM

## 2023-04-08 DIAGNOSIS — I252 Old myocardial infarction: Secondary | ICD-10-CM | POA: Diagnosis not present

## 2023-04-08 DIAGNOSIS — Z48812 Encounter for surgical aftercare following surgery on the circulatory system: Secondary | ICD-10-CM | POA: Diagnosis not present

## 2023-04-08 NOTE — Progress Notes (Signed)
Daily Session Note  Patient Details  Name: Karina Freeman MRN: [id number] Date of Birth: 14-Jun-1951 Referring Provider:   Flowsheet Row Cardiac Rehab from 04/04/2023 in [facility] Cardiac and Pulmonary Rehab  Referring Provider Dr. Lorine Bears, MD       Encounter Date: 04/08/2023  Check In:  Session Check In - 04/08/23 1541       Check-In   Supervising physician immediately available to respond to emergencies See telemetry face sheet for immediately available ER MD    Location [facility]-Cardiac & Pulmonary Rehab    Staff Present Maxon Suzzette Righter, , Exercise Physiologist;Jadien [place] Jewel Baize, RN BSN;Joseph Reino Kent, RCP,RRT,BSRT    Virtual Visit No    Medication changes reported     No    Fall or balance concerns reported    No    Tobacco Cessation Use Increase    Warm-up and Cool-down Performed on first and last piece of equipment    Resistance Training Performed Yes    VAD Patient? No      Pain Assessment   Currently in Pain? No/denies                Social History   Tobacco Use  Smoking Status Former   Current packs/day: 0.00   Types: Cigarettes   Quit date: 07/16/1973   Years since quitting: 49.7  Smokeless Tobacco Never    Goals Met:  Independence with exercise equipment Exercise tolerated well No report of concerns or symptoms today Strength training completed today  Goals Unmet:  Not Applicable  Comments: First full day of exercise!  Patient was oriented to gym and equipment including functions, settings, policies, and procedures.  Patient's individual exercise prescription and treatment plan were reviewed.  All starting workloads were established based on the results of the 6 minute walk test done at initial orientation visit.  The plan for exercise progression was also introduced and progression will be customized based on patient's performance and goals.    Dr. Bethann Punches is Medical Director for [facility] Cardiac Rehabilitation.  Dr. Vida Rigger is Medical  Director for [facility] Pulmonary Rehabilitation.

## 2023-04-11 ENCOUNTER — Encounter: Payer: Medicare Other | Admitting: *Deleted

## 2023-04-11 DIAGNOSIS — Z48812 Encounter for surgical aftercare following surgery on the circulatory system: Secondary | ICD-10-CM | POA: Diagnosis not present

## 2023-04-11 DIAGNOSIS — Z955 Presence of coronary angioplasty implant and graft: Secondary | ICD-10-CM | POA: Diagnosis not present

## 2023-04-11 DIAGNOSIS — I252 Old myocardial infarction: Secondary | ICD-10-CM | POA: Diagnosis not present

## 2023-04-11 DIAGNOSIS — I213 ST elevation (STEMI) myocardial infarction of unspecified site: Secondary | ICD-10-CM

## 2023-04-11 NOTE — Progress Notes (Signed)
Daily Session Note  Patient Details  Name: Karina Freeman MRN: [id number] Date of Birth: [birth year] Referring Provider:   Flowsheet Row Cardiac Rehab from 04/04/2023 in [facility] Cardiac and Pulmonary Rehab  Referring Provider Dr. Lorine Bears, MD       Encounter Date: 04/11/2023  Check In:  Session Check In - 04/11/23 1536       Check-In   Supervising physician immediately available to respond to emergencies See telemetry face sheet for immediately available ER MD    Location [facility]-Cardiac & Pulmonary Rehab    Staff Present Elige Ko, RCP,RRT,BSRT;Maximilliano Kersh Jewel Baize, RN Atilano Median, RN, ADN    Virtual Visit No    Medication changes reported     No    Fall or balance concerns reported    No    Warm-up and Cool-down Performed on first and last piece of equipment    Resistance Training Performed Yes    VAD Patient? No                Social History   Tobacco Use  Smoking Status Former   Current packs/day: 0.00   Types: Cigarettes   Quit date: 07/16/1973   Years since quitting: 49.7  Smokeless Tobacco Never    Goals Met:  Independence with exercise equipment Exercise tolerated well No report of concerns or symptoms today Strength training completed today  Goals Unmet:  Not Applicable  Comments: Pt able to follow exercise prescription today without complaint.  Will continue to monitor for progression.    Dr. Bethann Punches is Medical Director for [facility] Cardiac Rehabilitation.  Dr. Vida Rigger is Medical Director for [facility] Pulmonary Rehabilitation.

## 2023-04-17 ENCOUNTER — Encounter: Payer: Self-pay | Admitting: *Deleted

## 2023-04-17 DIAGNOSIS — I213 ST elevation (STEMI) myocardial infarction of unspecified site: Secondary | ICD-10-CM

## 2023-04-17 DIAGNOSIS — Z955 Presence of coronary angioplasty implant and graft: Secondary | ICD-10-CM

## 2023-04-17 NOTE — Progress Notes (Signed)
Cardiac Individual Treatment Plan  Patient Details  Name: Karina Freeman MRN: [id number] Date of Birth: 11-28-1950 Referring Provider:   Flowsheet Row Cardiac Rehab from 04/04/2023 in [facility] Cardiac and Pulmonary Rehab  Referring Provider Dr. Lorine Bears, MD       Initial Encounter Date:  Flowsheet Row Cardiac Rehab from 04/04/2023 in [facility] Cardiac and Pulmonary Rehab  Date 04/04/23       Visit Diagnosis: ST elevation myocardial infarction (STEMI), unspecified artery [facility])  Status post coronary artery stent placement  Patient's Home Medications on Admission:  Current Outpatient Medications:    amLODipine (NORVASC) 5 MG tablet, Take 1 tablet (5 mg total) by mouth at bedtime., Disp: 90 tablet, Rfl: 1   aspirin 81 MG chewable tablet, Chew 1 tablet (81 mg total) by mouth daily., Disp: 90 tablet, Rfl: 3   atorvastatin (LIPITOR) 80 MG tablet, Take 1 tablet (80 mg total) by mouth daily., Disp: 90 tablet, Rfl: 3   carvedilol (COREG) 12.5 MG tablet, Take 1 tablet (12.5 mg total) by mouth 2 (two) times daily with a meal., Disp: 180 tablet, Rfl: 3   losartan (COZAAR) 50 MG tablet, Take 1 tablet (50 mg total) by mouth daily., Disp: 90 tablet, Rfl: 0   nitroGLYCERIN (NITROSTAT) 0.4 MG SL tablet, Place 1 tablet (0.4 mg total) under the tongue every 5 (five) minutes as needed for chest pain., Disp: 100 tablet, Rfl: 3   ticagrelor (BRILINTA) 90 MG TABS tablet, Take 1 tablet (90 mg total) by mouth 2 (two) times daily., Disp: 60 tablet, Rfl: 11  Past Medical History: Past Medical History:  Diagnosis Date   Abnormal Pap smear of cervix 11/11/2015   Acute HFrEF (heart failure with reduced ejection fraction) (HCC) 02/22/2023   Acute ST elevation myocardial infarction (STEMI) of anterior wall (HCC) 02/15/2023   Anxiety    CVA (cerebral infarction)    Depressive disorder    GERD (gastroesophageal reflux disease)    Hyperlipidemia    Hypertension    IFG (impaired fasting glucose)     Osteoporosis    ST elevation myocardial infarction (STEMI) (HCC) 02/17/2023    Tobacco Use: Social History   Tobacco Use  Smoking Status Former   Current packs/day: 0.00   Types: Cigarettes   Quit date: 07/16/1973   Years since quitting: 49.7  Smokeless Tobacco Never    Labs: Review Flowsheet  More data may exist      Latest Ref Rng & Units 04/26/2015 11/11/2015 06/25/2016 02/15/2023 03/14/2023  Labs for ITP Cardiac and Pulmonary Rehab  Cholestrol 100 - 199 mg/dL 951  884  166  063  016   LDL (calc) 0 - 99 mg/dL - 74  92  010  56   HDL-C >39 mg/dL - 61  63  60  52   Trlycerides 0 - 149 mg/dL 95  932  355  84  86   Hemoglobin A1c 4.8 - 5.6 % 5.6  - - 5.4  -    Details             Exercise Target Goals: Exercise Program Goal: Individual exercise prescription set using results from initial 6 min walk test and THRR while considering  patient's activity barriers and safety.   Exercise Prescription Goal: Initial exercise prescription builds to 30-45 minutes a day of aerobic activity, 2-3 days per week.  Home exercise guidelines will be given to patient during program as part of exercise prescription that the participant will acknowledge.   Education: Aerobic  Exercise: - Group verbal and visual presentation on the components of exercise prescription. Introduces F.I.T.T principle from ACSM for exercise prescriptions.  Reviews F.I.T.T. principles of aerobic exercise including progression. Written material given at graduation. Flowsheet Row Cardiac Rehab from 04/04/2023 in [facility] Cardiac and Pulmonary Rehab  Education need identified 04/04/23       Education: Resistance Exercise: - Group verbal and visual presentation on the components of exercise prescription. Introduces F.I.T.T principle from ACSM for exercise prescriptions  Reviews F.I.T.T. principles of resistance exercise including progression. Written material given at graduation.    Education: Exercise & Equipment  Safety: - Individual verbal instruction and demonstration of equipment use and safety with use of the equipment. Flowsheet Row Cardiac Rehab from 04/04/2023 in [facility] Cardiac and Pulmonary Rehab  Date 03/25/23  Educator [facility]  Instruction Review Code 1- Verbalizes Understanding       Education: Exercise Physiology & General Exercise Guidelines: - Group verbal and written instruction with models to review the exercise physiology of the cardiovascular system and associated critical values. Provides general exercise guidelines with specific guidelines to those with heart or lung disease.    Education: Flexibility, Balance, Mind/Body Relaxation: - Group verbal and visual presentation with interactive activity on the components of exercise prescription. Introduces F.I.T.T principle from ACSM for exercise prescriptions. Reviews F.I.T.T. principles of flexibility and balance exercise training including progression. Also discusses the mind body connection.  Reviews various relaxation techniques to help reduce and manage stress (i.e. Deep breathing, progressive muscle relaxation, and visualization). Balance handout provided to take home. Written material given at graduation.   Activity Barriers & Risk Stratification:  Activity Barriers & Cardiac Risk Stratification - 04/04/23 1541       Activity Barriers & Cardiac Risk Stratification   Activity Barriers Other (comment)    Comments osteoporosis    Cardiac Risk Stratification High             6 Minute Walk:  6 Minute Walk     Row Name 04/04/23 1539         6 Minute Walk   Phase Initial     Distance 970 feet     Walk Time 6 minutes     # of Rest Breaks 0     MPH 1.84     METS 2.47     RPE 13     Perceived Dyspnea  1     VO2 Peak 8.66     Symptoms No     Resting HR 65 bpm     Resting BP 132/76     Resting Oxygen Saturation  99 %     Exercise Oxygen Saturation  during 6 min walk 98 %     Max Ex. HR 93 bpm     Max Ex. BP 132/76      2 Minute Post BP 130/72              Oxygen Initial Assessment:   Oxygen Re-Evaluation:   Oxygen Discharge (Final Oxygen Re-Evaluation):   Initial Exercise Prescription:  Initial Exercise Prescription - 04/04/23 1500       Date of Initial Exercise RX and Referring Provider   Date 04/04/23    Referring Provider Dr. Lorine Bears, MD      Oxygen   Maintain Oxygen Saturation 88% or higher      Treadmill   MPH 1.6    Grade 0    Minutes 15    METs 2.23      NuStep  Level 2    SPM 80    Minutes 15    METs 2.47      Biostep-RELP   Level 1    SPM 50    Minutes 15    METs 2.47      Track   Laps 25    Minutes 15    METs 2.36      Prescription Details   Frequency (times per week) 2    Duration Progress to 30 minutes of continuous aerobic without signs/symptoms of physical distress      Intensity   THRR 40-80% of Max Heartrate 98-131    Ratings of Perceived Exertion 11-13    Perceived Dyspnea 0-4      Progression   Progression Continue to progress workloads to maintain intensity without signs/symptoms of physical distress.      Resistance Training   Training Prescription Yes    Weight 2 lb    Reps 10-15             Perform Capillary Blood Glucose checks as needed.  Exercise Prescription Changes:   Exercise Prescription Changes     Row Name 04/04/23 1500             Response to Exercise   Blood Pressure (Admit) 132/76       Blood Pressure (Exercise) 146/76       Blood Pressure (Exit) 130/72       Heart Rate (Admit) 65 bpm       Heart Rate (Exercise) 93 bpm       Heart Rate (Exit) 68 bpm       Oxygen Saturation (Admit) 99 %       Oxygen Saturation (Exercise) 98 %       Rating of Perceived Exertion (Exercise) 13       Perceived Dyspnea (Exercise) 1       Symptoms none       Comments Results                Exercise Comments:   Exercise Comments     Row Name 04/08/23 1542           Exercise Comments First full  day of exercise!  Patient was oriented to gym and equipment including functions, settings, policies, and procedures.  Patient's individual exercise prescription and treatment plan were reviewed.  All starting workloads were established based on the results of the 6 minute walk test done at initial orientation visit.  The plan for exercise progression was also introduced and progression will be customized based on patient's performance and goals.                Exercise Goals and Review:   Exercise Goals     Row Name 04/04/23 1541             Exercise Goals   Increase Physical Activity Yes       Intervention Provide advice, education, support and counseling about physical activity/exercise needs.;Develop an individualized exercise prescription for aerobic and resistive training based on initial evaluation findings, risk stratification, comorbidities and participant's personal goals.       Expected Outcomes Short Term: Attend rehab on a regular basis to increase amount of physical activity.;Long Term: Exercising regularly at least 3-5 days a week.;Long Term: Add in home exercise to make exercise part of routine and to increase amount of physical activity.       Increase Strength and Stamina Yes  Intervention Provide advice, education, support and counseling about physical activity/exercise needs.;Develop an individualized exercise prescription for aerobic and resistive training based on initial evaluation findings, risk stratification, comorbidities and participant's personal goals.       Expected Outcomes Short Term: Increase workloads from initial exercise prescription for resistance, speed, and METs.;Short Term: Perform resistance training exercises routinely during rehab and add in resistance training at home;Long Term: Improve cardiorespiratory fitness, muscular endurance and strength as measured by increased METs and functional capacity ( )       Able to understand and use rate  of perceived exertion (RPE) scale Yes       Intervention Provide education and explanation on how to use RPE scale       Expected Outcomes Short Term: Able to use RPE daily in rehab to express subjective intensity level;Long Term:  Able to use RPE to guide intensity level when exercising independently       Able to understand and use Dyspnea scale Yes       Intervention Provide education and explanation on how to use Dyspnea scale       Expected Outcomes Long Term: Able to use Dyspnea scale to guide intensity level when exercising independently;Short Term: Able to use Dyspnea scale daily in rehab to express subjective sense of shortness of breath during exertion       Knowledge and understanding of Target Heart Rate Range (THRR) Yes       Intervention Provide education and explanation of THRR including how the numbers were predicted and where they are located for reference       Expected Outcomes Short Term: Able to state/look up THRR;Long Term: Able to use THRR to govern intensity when exercising independently;Short Term: Able to use daily as guideline for intensity in rehab       Able to check pulse independently Yes       Intervention Provide education and demonstration on how to check pulse in carotid and radial arteries.;Review the importance of being able to check your own pulse for safety during independent exercise       Expected Outcomes Short Term: Able to explain why pulse checking is important during independent exercise;Long Term: Able to check pulse independently and accurately       Understanding of Exercise Prescription Yes       Intervention Provide education, explanation, and written materials on patient's individual exercise prescription       Expected Outcomes Short Term: Able to explain program exercise prescription;Long Term: Able to explain home exercise prescription to exercise independently                Exercise Goals Re-Evaluation :  Exercise Goals Re-Evaluation      Row Name 04/08/23 1543             Exercise Goal Re-Evaluation   Exercise Goals Review Able to understand and use rate of perceived exertion (RPE) scale;Increase Physical Activity;Knowledge and understanding of Target Heart Rate Range (THRR);Understanding of Exercise Prescription;Increase Strength and Stamina;Able to check pulse independently       Comments Reviewed RPE and dyspnea scale, THR and program prescription with pt today.  Pt voiced understanding and was given a copy of goals to take home.       Expected Outcomes Short: Use RPE daily to regulate intensity.  Long: Follow program prescription in THR.                Discharge Exercise Prescription (Final Exercise Prescription Changes):  Exercise Prescription Changes - 04/04/23 1500       Response to Exercise   Blood Pressure (Admit) 132/76    Blood Pressure (Exercise) 146/76    Blood Pressure (Exit) 130/72    Heart Rate (Admit) 65 bpm    Heart Rate (Exercise) 93 bpm    Heart Rate (Exit) 68 bpm    Oxygen Saturation (Admit) 99 %    Oxygen Saturation (Exercise) 98 %    Rating of Perceived Exertion (Exercise) 13    Perceived Dyspnea (Exercise) 1    Symptoms none    Comments Results             Nutrition:  Target Goals: Understanding of nutrition guidelines, daily intake of sodium 1500mg , cholesterol 200mg , calories 30% from fat and 7% or less from saturated fats, daily to have 5 or more servings of fruits and vegetables.  Education: All About Nutrition: -Group instruction provided by verbal, written material, interactive activities, discussions, models, and posters to present general guidelines for heart healthy nutrition including fat, fiber, MyPlate, the role of sodium in heart healthy nutrition, utilization of the nutrition label, and utilization of this knowledge for meal planning. Follow up email sent as well. Written material given at graduation. Flowsheet Row Cardiac Rehab from 04/04/2023 in [facility]  Cardiac and Pulmonary Rehab  Education need identified 04/04/23       Biometrics:  Pre Biometrics - 04/04/23 1542       Pre Biometrics   Height 5' 7.5" (1.715 m)    Weight 154 lb 6.4 oz (70 kg)    Waist Circumference 34 inches    Hip Circumference 41 inches    Waist to Hip Ratio 0.83 %    BMI (Calculated) 23.81    Single Leg Stand 7.8 seconds              Nutrition Therapy Plan and Nutrition Goals:  Nutrition Therapy & Goals - 04/04/23 1555       Nutrition Therapy   RD appointment deferred Yes      Intervention Plan   Intervention Prescribe, educate and counsel regarding individualized specific dietary modifications aiming towards targeted core components such as weight, hypertension, lipid management, diabetes, heart failure and other comorbidities.    Expected Outcomes Short Term Goal: Understand basic principles of dietary content, such as calories, fat, sodium, cholesterol and nutrients.;Short Term Goal: A plan has been developed with personal nutrition goals set during dietitian appointment.;Long Term Goal: Adherence to prescribed nutrition plan.             Nutrition Assessments:  MEDIFICTS Score Key: >=70 Need to make dietary changes  40-70 Heart Healthy Diet <= 40 Therapeutic Level Cholesterol Diet  Flowsheet Row Cardiac Rehab from 04/04/2023 in [facility] Cardiac and Pulmonary Rehab  Picture Your Plate Total Score on Admission 72      Picture Your Plate Scores: <16 Unhealthy dietary pattern with much room for improvement. 41-50 Dietary pattern unlikely to meet recommendations for good health and room for improvement. 51-60 More healthful dietary pattern, with some room for improvement.  >60 Healthy dietary pattern, although there may be some specific behaviors that could be improved.    Nutrition Goals Re-Evaluation:   Nutrition Goals Discharge (Final Nutrition Goals Re-Evaluation):   Psychosocial: Target Goals: Acknowledge presence or absence of  significant depression and/or stress, maximize coping skills, provide positive support system. Participant is able to verbalize types and ability to use techniques and skills needed for reducing stress and depression.  Education: Stress, Anxiety, and Depression - Group verbal and visual presentation to define topics covered.  Reviews how body is impacted by stress, anxiety, and depression.  Also discusses healthy ways to reduce stress and to treat/manage anxiety and depression.  Written material given at graduation. Flowsheet Row Cardiac Rehab from 04/04/2023 in [facility] Cardiac and Pulmonary Rehab  Education need identified 04/04/23       Education: Sleep Hygiene -Provides group verbal and written instruction about how sleep can affect your health.  Define sleep hygiene, discuss sleep cycles and impact of sleep habits. Review good sleep hygiene tips.    Initial Review & Psychosocial Screening:  Initial Psych Review & Screening - 03/25/23 1110       Initial Review   Current issues with Current Sleep Concerns      Family Dynamics   Good Support System? Yes    Comments Destinii has trouble sleeping at night worring about things in life. She can look to her husband, daughter and church friends for support.      Barriers   Psychosocial barriers to participate in program The patient should benefit from training in stress management and relaxation.;There are no identifiable barriers or psychosocial needs.      Screening Interventions   Interventions Encouraged to exercise;To provide support and resources with identified psychosocial needs;Provide feedback about the scores to participant    Expected Outcomes Short Term goal: Utilizing psychosocial counselor, staff and physician to assist with identification of specific Stressors or current issues interfering with healing process. Setting desired goal for each stressor or current issue identified.;Long Term Goal: Stressors or current issues are  controlled or eliminated.;Short Term goal: Identification and review with participant of any Quality of Life or Depression concerns found by scoring the questionnaire.;Long Term goal: The participant improves quality of Life and PHQ9 Scores as seen by post scores and/or verbalization of changes             Quality of Life Scores:   Quality of Life - 04/04/23 1558       Quality of Life   Select Quality of Life      Quality of Life Scores   Health/Function Pre 25.6 %    Socioeconomic Pre 27.88 %    Psych/Spiritual Pre 30 %    Family Pre 27.6 %    GLOBAL Pre 27.29 %            Scores of 19 and below usually indicate a poorer quality of life in these areas.  A difference of  2-3 points is a clinically meaningful difference.  A difference of 2-3 points in the total score of the Quality of Life Index has been associated with significant improvement in overall quality of life, self-image, physical symptoms, and general health in studies assessing change in quality of life.  PHQ-9: Review Flowsheet       04/04/2023 03/26/2023 11/11/2015  Depression screen PHQ 2/9  Decreased Interest 0 1 0  Down, Depressed, Hopeless 0 0 0  PHQ - 2 Score 0 1 0  Altered sleeping 1 1 -  Tired, decreased energy 1 1 -  Change in appetite 0 0 -  Feeling bad or failure about yourself  0 0 -  Trouble concentrating 1 1 -  Moving slowly or fidgety/restless 0 1 -  Suicidal thoughts 0 0 -  PHQ-9 Score 3 5 -  Difficult doing work/chores Not difficult at all Not difficult at all -    Details  Interpretation of Total Score  Total Score Depression Severity:  1-4 = Minimal depression, 5-9 = Mild depression, 10-14 = Moderate depression, 15-19 = Moderately severe depression, 20-27 = Severe depression   Psychosocial Evaluation and Intervention:  Psychosocial Evaluation - 03/25/23 1113       Psychosocial Evaluation & Interventions   Interventions Encouraged to exercise with the program and  follow exercise prescription;Relaxation education;Stress management education    Comments Sydna has trouble sleeping at night worring about things in life. She can look to her husband, daughter and church friends for support.    Expected Outcomes Short: Start HeartTrack to help with mood. Long: Maintain a healthy mental state    Continue Psychosocial Services  Follow up required by staff             Psychosocial Re-Evaluation:   Psychosocial Discharge (Final Psychosocial Re-Evaluation):   Vocational Rehabilitation: Provide vocational rehab assistance to qualifying candidates.   Vocational Rehab Evaluation & Intervention:   Education: Education Goals: Education classes will be provided on a variety of topics geared toward better understanding of heart health and risk factor modification. Participant will state understanding/return demonstration of topics presented as noted by education test scores.  Learning Barriers/Preferences:  Learning Barriers/Preferences - 03/25/23 1110       Learning Barriers/Preferences   Learning Barriers None    Learning Preferences None             General Cardiac Education Topics:  AED/CPR: - Group verbal and written instruction with the use of models to demonstrate the basic use of the AED with the basic ABC's of resuscitation.   Anatomy and Cardiac Procedures: - Group verbal and visual presentation and models provide information about basic cardiac anatomy and function. Reviews the testing methods done to diagnose heart disease and the outcomes of the test results. Describes the treatment choices: Medical Management, Angioplasty, or Coronary Bypass Surgery for treating various heart conditions including Myocardial Infarction, Angina, Valve Disease, and Cardiac Arrhythmias.  Written material given at graduation.   Medication Safety: - Group verbal and visual instruction to review commonly prescribed medications for heart and lung  disease. Reviews the medication, class of the drug, and side effects. Includes the steps to properly store meds and maintain the prescription regimen.  Written material given at graduation.   Intimacy: - Group verbal instruction through game format to discuss how heart and lung disease can affect sexual intimacy. Written material given at graduation..   Know Your Numbers and Heart Failure: - Group verbal and visual instruction to discuss disease risk factors for cardiac and pulmonary disease and treatment options.  Reviews associated critical values for Overweight/Obesity, Hypertension, Cholesterol, and Diabetes.  Discusses basics of heart failure: signs/symptoms and treatments.  Introduces Heart Failure Zone chart for action plan for heart failure.  Written material given at graduation.   Infection Prevention: - Provides verbal and written material to individual with discussion of infection control including proper hand washing and proper equipment cleaning during exercise session. Flowsheet Row Cardiac Rehab from 04/04/2023 in [facility] Cardiac and Pulmonary Rehab  Date 03/25/23  Educator [facility]  Instruction Review Code 1- Verbalizes Understanding       Falls Prevention: - Provides verbal and written material to individual with discussion of falls prevention and safety. Flowsheet Row Cardiac Rehab from 04/04/2023 in [facility] Cardiac and Pulmonary Rehab  Date 03/25/23  Educator [facility]  Instruction Review Code 1- Verbalizes Understanding       Other: -Provides group and verbal instruction on  various topics (see comments)   Knowledge Questionnaire Score:  Knowledge Questionnaire Score - 04/04/23 1552       Knowledge Questionnaire Score   Pre Score 23/26             Core Components/Risk Factors/Patient Goals at Admission:  Personal Goals and Risk Factors at Admission - 03/25/23 1110       Core Components/Risk Factors/Patient Goals on Admission    Weight Management Yes;Weight  Maintenance    Intervention Weight Management: Develop a combined nutrition and exercise program designed to reach desired caloric intake, while maintaining appropriate intake of nutrient and fiber, sodium and fats, and appropriate energy expenditure required for the weight goal.;Weight Management: Provide education and appropriate resources to help participant work on and attain dietary goals.;Weight Management/Obesity: Establish reasonable short term and long term weight goals.    Expected Outcomes Short Term: Continue to assess and modify interventions until short term weight is achieved;Long Term: Adherence to nutrition and physical activity/exercise program aimed toward attainment of established weight goal;Weight Maintenance: Understanding of the daily nutrition guidelines, which includes 25-35% calories from fat, 7% or less cal from saturated fats, less than 200mg  cholesterol, less than 1.5gm of sodium, & 5 or more servings of fruits and vegetables daily;Understanding recommendations for meals to include 15-35% energy as protein, 25-35% energy from fat, 35-60% energy from carbohydrates, less than 200mg  of dietary cholesterol, 20-35 gm of total fiber daily;Understanding of distribution of calorie intake throughout the day with the consumption of 4-5 meals/snacks    Heart Failure Yes    Intervention Provide a combined exercise and nutrition program that is supplemented with education, support and counseling about heart failure. Directed toward relieving symptoms such as shortness of breath, decreased exercise tolerance, and extremity edema.    Expected Outcomes Improve functional capacity of life;Short term: Attendance in program 2-3 days a week with increased exercise capacity. Reported lower sodium intake. Reported increased fruit and vegetable intake. Reports medication compliance.;Short term: Daily weights obtained and reported for increase. Utilizing diuretic protocols set by physician.;Long term:  Adoption of self-care skills and reduction of barriers for early signs and symptoms recognition and intervention leading to self-care maintenance.    Hypertension Yes    Intervention Provide education on lifestyle modifcations including regular physical activity/exercise, weight management, moderate sodium restriction and increased consumption of fresh fruit, vegetables, and low fat dairy, alcohol moderation, and smoking cessation.;Monitor prescription use compliance.    Expected Outcomes Short Term: Continued assessment and intervention until BP is < 140/1mm HG in hypertensive participants. < 130/98mm HG in hypertensive participants with diabetes, heart failure or chronic kidney disease.;Long Term: Maintenance of blood pressure at goal levels.    Lipids Yes    Intervention Provide education and support for participant on nutrition & aerobic/resistive exercise along with prescribed medications to achieve LDL 70mg , HDL >40mg .    Expected Outcomes Short Term: Participant states understanding of desired cholesterol values and is compliant with medications prescribed. Participant is following exercise prescription and nutrition guidelines.;Long Term: Cholesterol controlled with medications as prescribed, with individualized exercise RX and with personalized nutrition plan. Value goals: LDL < 70mg , HDL > 40 mg.             Education:Diabetes - Individual verbal and written instruction to review signs/symptoms of diabetes, desired ranges of glucose level fasting, after meals and with exercise. Acknowledge that pre and post exercise glucose checks will be done for 3 sessions at entry of program.   Core Components/Risk Factors/Patient Goals Review:  Core Components/Risk Factors/Patient Goals at Discharge (Final Review):    ITP Comments:  ITP Comments     Row Name 03/25/23 1109 04/04/23 1539 04/08/23 1542 04/17/23 1207     ITP Comments Virtual Visit completed. Patient informed on EP and RD  appointment and 6 Minute walk test. Patient also informed of patient health questionnaires on My Chart. Patient Verbalizes understanding. Visit diagnosis can be found in [facility] 02/15/2023. Completed and gym orientation. Initial ITP created and sent for review to Dr. Bethann Punches, Medical Director. First full day of exercise!  Patient was oriented to gym and equipment including functions, settings, policies, and procedures.  Patient's individual exercise prescription and treatment plan were reviewed.  All starting workloads were established based on the results of the 6 minute walk test done at initial orientation visit.  The plan for exercise progression was also introduced and progression will be customized based on patient's performance and goals. 30 Day review completed. Medical Director ITP review done, changes made as directed, and signed approval by Medical Director.    new to program             Comments:

## 2023-04-22 ENCOUNTER — Encounter: Payer: Self-pay | Admitting: *Deleted

## 2023-04-22 DIAGNOSIS — I213 ST elevation (STEMI) myocardial infarction of unspecified site: Secondary | ICD-10-CM

## 2023-04-22 DIAGNOSIS — Z955 Presence of coronary angioplasty implant and graft: Secondary | ICD-10-CM

## 2023-04-22 NOTE — Progress Notes (Signed)
Discharge Summary:  Karina Freeman  (DOB:  05/08/1951)  Pt discharged early due to she did not with to continue rehab at this time. [name] completed 3/36 sessions.    6 Minute Walk     Row Name 04/04/23 1539         6 Minute Walk   Phase Initial     Distance 970 feet     Walk Time 6 minutes     # of Rest Breaks 0     MPH 1.84     METS 2.47     RPE 13     Perceived Dyspnea  1     VO2 Peak 8.66     Symptoms No     Resting HR 65 bpm     Resting BP 132/76     Resting Oxygen Saturation  99 %     Exercise Oxygen Saturation  during 6 min walk 98 %     Max Ex. HR 93 bpm     Max Ex. BP 132/76     2 Minute Post BP 130/72

## 2023-04-22 NOTE — Progress Notes (Signed)
Cardiac Individual Treatment Plan  Patient Details  Name: Karina Freeman MRN: [id number] Date of Birth: 1951-06-06 Referring Provider:   Flowsheet Row Cardiac Rehab from 04/04/2023 in [facility] Cardiac and Pulmonary Rehab  Referring Provider Dr. Lorine Bears, MD       Initial Encounter Date:  Flowsheet Row Cardiac Rehab from 04/04/2023 in [facility] Cardiac and Pulmonary Rehab  Date 04/04/23       Visit Diagnosis: ST elevation myocardial infarction (STEMI), unspecified artery [facility])  Status post coronary artery stent placement  Patient's Home Medications on Admission:  Current Outpatient Medications:    amLODipine (NORVASC) 5 MG tablet, Take 1 tablet (5 mg total) by mouth at bedtime., Disp: 90 tablet, Rfl: 1   aspirin 81 MG chewable tablet, Chew 1 tablet (81 mg total) by mouth daily., Disp: 90 tablet, Rfl: 3   atorvastatin (LIPITOR) 80 MG tablet, Take 1 tablet (80 mg total) by mouth daily., Disp: 90 tablet, Rfl: 3   carvedilol (COREG) 12.5 MG tablet, Take 1 tablet (12.5 mg total) by mouth 2 (two) times daily with a meal., Disp: 180 tablet, Rfl: 3   losartan (COZAAR) 50 MG tablet, Take 1 tablet (50 mg total) by mouth daily., Disp: 90 tablet, Rfl: 0   nitroGLYCERIN (NITROSTAT) 0.4 MG SL tablet, Place 1 tablet (0.4 mg total) under the tongue every 5 (five) minutes as needed for chest pain., Disp: 100 tablet, Rfl: 3   ticagrelor (BRILINTA) 90 MG TABS tablet, Take 1 tablet (90 mg total) by mouth 2 (two) times daily., Disp: 60 tablet, Rfl: 11  Past Medical History: Past Medical History:  Diagnosis Date   Abnormal Pap smear of cervix 11/11/2015   Acute HFrEF (heart failure with reduced ejection fraction) (HCC) 02/22/2023   Acute ST elevation myocardial infarction (STEMI) of anterior wall (HCC) 02/15/2023   Anxiety    CVA (cerebral infarction)    Depressive disorder    GERD (gastroesophageal reflux disease)    Hyperlipidemia    Hypertension    IFG (impaired fasting glucose)     Osteoporosis    ST elevation myocardial infarction (STEMI) (HCC) 02/17/2023    Tobacco Use: Social History   Tobacco Use  Smoking Status Former   Current packs/day: 0.00   Types: Cigarettes   Quit date: 07/16/1973   Years since quitting: 49.8  Smokeless Tobacco Never    Labs: Review Flowsheet  More data may exist      Latest Ref Rng & Units 04/26/2015 11/11/2015 06/25/2016 02/15/2023 03/14/2023  Labs for ITP Cardiac and Pulmonary Rehab  Cholestrol 100 - 199 mg/dL 132  440  102  725  366   LDL (calc) 0 - 99 mg/dL - 74  92  440  56   HDL-C >39 mg/dL - 61  63  60  52   Trlycerides 0 - 149 mg/dL 95  347  425  84  86   Hemoglobin A1c 4.8 - 5.6 % 5.6  - - 5.4  -    Details             Exercise Target Goals: Exercise Program Goal: Individual exercise prescription set using results from initial 6 min walk test and THRR while considering  patient's activity barriers and safety.   Exercise Prescription Goal: Initial exercise prescription builds to 30-45 minutes a day of aerobic activity, 2-3 days per week.  Home exercise guidelines will be given to patient during program as part of exercise prescription that the participant will acknowledge.   Education: Aerobic  Exercise: - Group verbal and visual presentation on the components of exercise prescription. Introduces F.I.T.T principle from ACSM for exercise prescriptions.  Reviews F.I.T.T. principles of aerobic exercise including progression. Written material given at graduation. Flowsheet Row Cardiac Rehab from 04/04/2023 in [facility] Cardiac and Pulmonary Rehab  Education need identified 04/04/23       Education: Resistance Exercise: - Group verbal and visual presentation on the components of exercise prescription. Introduces F.I.T.T principle from ACSM for exercise prescriptions  Reviews F.I.T.T. principles of resistance exercise including progression. Written material given at graduation.    Education: Exercise & Equipment  Safety: - Individual verbal instruction and demonstration of equipment use and safety with use of the equipment. Flowsheet Row Cardiac Rehab from 04/04/2023 in [facility] Cardiac and Pulmonary Rehab  Date 03/25/23  Educator [facility]  Instruction Review Code 1- Verbalizes Understanding       Education: Exercise Physiology & General Exercise Guidelines: - Group verbal and written instruction with models to review the exercise physiology of the cardiovascular system and associated critical values. Provides general exercise guidelines with specific guidelines to those with heart or lung disease.    Education: Flexibility, Balance, Mind/Body Relaxation: - Group verbal and visual presentation with interactive activity on the components of exercise prescription. Introduces F.I.T.T principle from ACSM for exercise prescriptions. Reviews F.I.T.T. principles of flexibility and balance exercise training including progression. Also discusses the mind body connection.  Reviews various relaxation techniques to help reduce and manage stress (i.e. Deep breathing, progressive muscle relaxation, and visualization). Balance handout provided to take home. Written material given at graduation.   Activity Barriers & Risk Stratification:  Activity Barriers & Cardiac Risk Stratification - 04/04/23 1541       Activity Barriers & Cardiac Risk Stratification   Activity Barriers Other (comment)    Comments osteoporosis    Cardiac Risk Stratification High             6 Minute Walk:  6 Minute Walk     Row Name 04/04/23 1539         6 Minute Walk   Phase Initial     Distance 970 feet     Walk Time 6 minutes     # of Rest Breaks 0     MPH 1.84     METS 2.47     RPE 13     Perceived Dyspnea  1     VO2 Peak 8.66     Symptoms No     Resting HR 65 bpm     Resting BP 132/76     Resting Oxygen Saturation  99 %     Exercise Oxygen Saturation  during 6 min walk 98 %     Max Ex. HR 93 bpm     Max Ex. BP 132/76      2 Minute Post BP 130/72              Oxygen Initial Assessment:   Oxygen Re-Evaluation:   Oxygen Discharge (Final Oxygen Re-Evaluation):   Initial Exercise Prescription:  Initial Exercise Prescription - 04/04/23 1500       Date of Initial Exercise RX and Referring Provider   Date 04/04/23    Referring Provider Dr. Lorine Bears, MD      Oxygen   Maintain Oxygen Saturation 88% or higher      Treadmill   MPH 1.6    Grade 0    Minutes 15    METs 2.23      NuStep  Level 2    SPM 80    Minutes 15    METs 2.47      Biostep-RELP   Level 1    SPM 50    Minutes 15    METs 2.47      Track   Laps 25    Minutes 15    METs 2.36      Prescription Details   Frequency (times per week) 2    Duration Progress to 30 minutes of continuous aerobic without signs/symptoms of physical distress      Intensity   THRR 40-80% of Max Heartrate 98-131    Ratings of Perceived Exertion 11-13    Perceived Dyspnea 0-4      Progression   Progression Continue to progress workloads to maintain intensity without signs/symptoms of physical distress.      Resistance Training   Training Prescription Yes    Weight 2 lb    Reps 10-15             Perform Capillary Blood Glucose checks as needed.  Exercise Prescription Changes:   Exercise Prescription Changes     Row Name 04/04/23 1500             Response to Exercise   Blood Pressure (Admit) 132/76       Blood Pressure (Exercise) 146/76       Blood Pressure (Exit) 130/72       Heart Rate (Admit) 65 bpm       Heart Rate (Exercise) 93 bpm       Heart Rate (Exit) 68 bpm       Oxygen Saturation (Admit) 99 %       Oxygen Saturation (Exercise) 98 %       Rating of Perceived Exertion (Exercise) 13       Perceived Dyspnea (Exercise) 1       Symptoms none       Comments Results                Exercise Comments:   Exercise Comments     Row Name 04/08/23 1542           Exercise Comments First full  day of exercise!  Patient was oriented to gym and equipment including functions, settings, policies, and procedures.  Patient's individual exercise prescription and treatment plan were reviewed.  All starting workloads were established based on the results of the 6 minute walk test done at initial orientation visit.  The plan for exercise progression was also introduced and progression will be customized based on patient's performance and goals.                Exercise Goals and Review:   Exercise Goals     Row Name 04/04/23 1541             Exercise Goals   Increase Physical Activity Yes       Intervention Provide advice, education, support and counseling about physical activity/exercise needs.;Develop an individualized exercise prescription for aerobic and resistive training based on initial evaluation findings, risk stratification, comorbidities and participant's personal goals.       Expected Outcomes Short Term: Attend rehab on a regular basis to increase amount of physical activity.;Long Term: Exercising regularly at least 3-5 days a week.;Long Term: Add in home exercise to make exercise part of routine and to increase amount of physical activity.       Increase Strength and Stamina Yes  Intervention Provide advice, education, support and counseling about physical activity/exercise needs.;Develop an individualized exercise prescription for aerobic and resistive training based on initial evaluation findings, risk stratification, comorbidities and participant's personal goals.       Expected Outcomes Short Term: Increase workloads from initial exercise prescription for resistance, speed, and METs.;Short Term: Perform resistance training exercises routinely during rehab and add in resistance training at home;Long Term: Improve cardiorespiratory fitness, muscular endurance and strength as measured by increased METs and functional capacity ( )       Able to understand and use rate  of perceived exertion (RPE) scale Yes       Intervention Provide education and explanation on how to use RPE scale       Expected Outcomes Short Term: Able to use RPE daily in rehab to express subjective intensity level;Long Term:  Able to use RPE to guide intensity level when exercising independently       Able to understand and use Dyspnea scale Yes       Intervention Provide education and explanation on how to use Dyspnea scale       Expected Outcomes Long Term: Able to use Dyspnea scale to guide intensity level when exercising independently;Short Term: Able to use Dyspnea scale daily in rehab to express subjective sense of shortness of breath during exertion       Knowledge and understanding of Target Heart Rate Range (THRR) Yes       Intervention Provide education and explanation of THRR including how the numbers were predicted and where they are located for reference       Expected Outcomes Short Term: Able to state/look up THRR;Long Term: Able to use THRR to govern intensity when exercising independently;Short Term: Able to use daily as guideline for intensity in rehab       Able to check pulse independently Yes       Intervention Provide education and demonstration on how to check pulse in carotid and radial arteries.;Review the importance of being able to check your own pulse for safety during independent exercise       Expected Outcomes Short Term: Able to explain why pulse checking is important during independent exercise;Long Term: Able to check pulse independently and accurately       Understanding of Exercise Prescription Yes       Intervention Provide education, explanation, and written materials on patient's individual exercise prescription       Expected Outcomes Short Term: Able to explain program exercise prescription;Long Term: Able to explain home exercise prescription to exercise independently                Exercise Goals Re-Evaluation :  Exercise Goals Re-Evaluation      Row Name 04/08/23 1543             Exercise Goal Re-Evaluation   Exercise Goals Review Able to understand and use rate of perceived exertion (RPE) scale;Increase Physical Activity;Knowledge and understanding of Target Heart Rate Range (THRR);Understanding of Exercise Prescription;Increase Strength and Stamina;Able to check pulse independently       Comments Reviewed RPE and dyspnea scale, THR and program prescription with pt today.  Pt voiced understanding and was given a copy of goals to take home.       Expected Outcomes Short: Use RPE daily to regulate intensity.  Long: Follow program prescription in THR.                Discharge Exercise Prescription (Final Exercise Prescription Changes):  Exercise Prescription Changes - 04/04/23 1500       Response to Exercise   Blood Pressure (Admit) 132/76    Blood Pressure (Exercise) 146/76    Blood Pressure (Exit) 130/72    Heart Rate (Admit) 65 bpm    Heart Rate (Exercise) 93 bpm    Heart Rate (Exit) 68 bpm    Oxygen Saturation (Admit) 99 %    Oxygen Saturation (Exercise) 98 %    Rating of Perceived Exertion (Exercise) 13    Perceived Dyspnea (Exercise) 1    Symptoms none    Comments Results             Nutrition:  Target Goals: Understanding of nutrition guidelines, daily intake of sodium 1500mg , cholesterol 200mg , calories 30% from fat and 7% or less from saturated fats, daily to have 5 or more servings of fruits and vegetables.  Education: All About Nutrition: -Group instruction provided by verbal, written material, interactive activities, discussions, models, and posters to present general guidelines for heart healthy nutrition including fat, fiber, MyPlate, the role of sodium in heart healthy nutrition, utilization of the nutrition label, and utilization of this knowledge for meal planning. Follow up email sent as well. Written material given at graduation. Flowsheet Row Cardiac Rehab from 04/04/2023 in [facility]  Cardiac and Pulmonary Rehab  Education need identified 04/04/23       Biometrics:  Pre Biometrics - 04/04/23 1542       Pre Biometrics   Height 5' 7.5" (1.715 m)    Weight 154 lb 6.4 oz (70 kg)    Waist Circumference 34 inches    Hip Circumference 41 inches    Waist to Hip Ratio 0.83 %    BMI (Calculated) 23.81    Single Leg Stand 7.8 seconds              Nutrition Therapy Plan and Nutrition Goals:  Nutrition Therapy & Goals - 04/04/23 1555       Nutrition Therapy   RD appointment deferred Yes      Intervention Plan   Intervention Prescribe, educate and counsel regarding individualized specific dietary modifications aiming towards targeted core components such as weight, hypertension, lipid management, diabetes, heart failure and other comorbidities.    Expected Outcomes Short Term Goal: Understand basic principles of dietary content, such as calories, fat, sodium, cholesterol and nutrients.;Short Term Goal: A plan has been developed with personal nutrition goals set during dietitian appointment.;Long Term Goal: Adherence to prescribed nutrition plan.             Nutrition Assessments:  MEDIFICTS Score Key: >=70 Need to make dietary changes  40-70 Heart Healthy Diet <= 40 Therapeutic Level Cholesterol Diet  Flowsheet Row Cardiac Rehab from 04/04/2023 in [facility] Cardiac and Pulmonary Rehab  Picture Your Plate Total Score on Admission 72      Picture Your Plate Scores: <91 Unhealthy dietary pattern with much room for improvement. 41-50 Dietary pattern unlikely to meet recommendations for good health and room for improvement. 51-60 More healthful dietary pattern, with some room for improvement.  >60 Healthy dietary pattern, although there may be some specific behaviors that could be improved.    Nutrition Goals Re-Evaluation:   Nutrition Goals Discharge (Final Nutrition Goals Re-Evaluation):   Psychosocial: Target Goals: Acknowledge presence or absence of  significant depression and/or stress, maximize coping skills, provide positive support system. Participant is able to verbalize types and ability to use techniques and skills needed for reducing stress and depression.  Education: Stress, Anxiety, and Depression - Group verbal and visual presentation to define topics covered.  Reviews how body is impacted by stress, anxiety, and depression.  Also discusses healthy ways to reduce stress and to treat/manage anxiety and depression.  Written material given at graduation. Flowsheet Row Cardiac Rehab from 04/04/2023 in [facility] Cardiac and Pulmonary Rehab  Education need identified 04/04/23       Education: Sleep Hygiene -Provides group verbal and written instruction about how sleep can affect your health.  Define sleep hygiene, discuss sleep cycles and impact of sleep habits. Review good sleep hygiene tips.    Initial Review & Psychosocial Screening:  Initial Psych Review & Screening - 03/25/23 1110       Initial Review   Current issues with Current Sleep Concerns      Family Dynamics   Good Support System? Yes    Comments Loan has trouble sleeping at night worring about things in life. She can look to her husband, daughter and church friends for support.      Barriers   Psychosocial barriers to participate in program The patient should benefit from training in stress management and relaxation.;There are no identifiable barriers or psychosocial needs.      Screening Interventions   Interventions Encouraged to exercise;To provide support and resources with identified psychosocial needs;Provide feedback about the scores to participant    Expected Outcomes Short Term goal: Utilizing psychosocial counselor, staff and physician to assist with identification of specific Stressors or current issues interfering with healing process. Setting desired goal for each stressor or current issue identified.;Long Term Goal: Stressors or current issues are  controlled or eliminated.;Short Term goal: Identification and review with participant of any Quality of Life or Depression concerns found by scoring the questionnaire.;Long Term goal: The participant improves quality of Life and PHQ9 Scores as seen by post scores and/or verbalization of changes             Quality of Life Scores:   Quality of Life - 04/04/23 1558       Quality of Life   Select Quality of Life      Quality of Life Scores   Health/Function Pre 25.6 %    Socioeconomic Pre 27.88 %    Psych/Spiritual Pre 30 %    Family Pre 27.6 %    GLOBAL Pre 27.29 %            Scores of 19 and below usually indicate a poorer quality of life in these areas.  A difference of  2-3 points is a clinically meaningful difference.  A difference of 2-3 points in the total score of the Quality of Life Index has been associated with significant improvement in overall quality of life, self-image, physical symptoms, and general health in studies assessing change in quality of life.  PHQ-9: Review Flowsheet       04/04/2023 03/26/2023 11/11/2015  Depression screen PHQ 2/9  Decreased Interest 0 1 0  Down, Depressed, Hopeless 0 0 0  PHQ - 2 Score 0 1 0  Altered sleeping 1 1 -  Tired, decreased energy 1 1 -  Change in appetite 0 0 -  Feeling bad or failure about yourself  0 0 -  Trouble concentrating 1 1 -  Moving slowly or fidgety/restless 0 1 -  Suicidal thoughts 0 0 -  PHQ-9 Score 3 5 -  Difficult doing work/chores Not difficult at all Not difficult at all -    Details  Interpretation of Total Score  Total Score Depression Severity:  1-4 = Minimal depression, 5-9 = Mild depression, 10-14 = Moderate depression, 15-19 = Moderately severe depression, 20-27 = Severe depression   Psychosocial Evaluation and Intervention:  Psychosocial Evaluation - 03/25/23 1113       Psychosocial Evaluation & Interventions   Interventions Encouraged to exercise with the program and  follow exercise prescription;Relaxation education;Stress management education    Comments Jaszmine has trouble sleeping at night worring about things in life. She can look to her husband, daughter and church friends for support.    Expected Outcomes Short: Start HeartTrack to help with mood. Long: Maintain a healthy mental state    Continue Psychosocial Services  Follow up required by staff             Psychosocial Re-Evaluation:   Psychosocial Discharge (Final Psychosocial Re-Evaluation):   Vocational Rehabilitation: Provide vocational rehab assistance to qualifying candidates.   Vocational Rehab Evaluation & Intervention:   Education: Education Goals: Education classes will be provided on a variety of topics geared toward better understanding of heart health and risk factor modification. Participant will state understanding/return demonstration of topics presented as noted by education test scores.  Learning Barriers/Preferences:  Learning Barriers/Preferences - 03/25/23 1110       Learning Barriers/Preferences   Learning Barriers None    Learning Preferences None             General Cardiac Education Topics:  AED/CPR: - Group verbal and written instruction with the use of models to demonstrate the basic use of the AED with the basic ABC's of resuscitation.   Anatomy and Cardiac Procedures: - Group verbal and visual presentation and models provide information about basic cardiac anatomy and function. Reviews the testing methods done to diagnose heart disease and the outcomes of the test results. Describes the treatment choices: Medical Management, Angioplasty, or Coronary Bypass Surgery for treating various heart conditions including Myocardial Infarction, Angina, Valve Disease, and Cardiac Arrhythmias.  Written material given at graduation.   Medication Safety: - Group verbal and visual instruction to review commonly prescribed medications for heart and lung  disease. Reviews the medication, class of the drug, and side effects. Includes the steps to properly store meds and maintain the prescription regimen.  Written material given at graduation.   Intimacy: - Group verbal instruction through game format to discuss how heart and lung disease can affect sexual intimacy. Written material given at graduation..   Know Your Numbers and Heart Failure: - Group verbal and visual instruction to discuss disease risk factors for cardiac and pulmonary disease and treatment options.  Reviews associated critical values for Overweight/Obesity, Hypertension, Cholesterol, and Diabetes.  Discusses basics of heart failure: signs/symptoms and treatments.  Introduces Heart Failure Zone chart for action plan for heart failure.  Written material given at graduation.   Infection Prevention: - Provides verbal and written material to individual with discussion of infection control including proper hand washing and proper equipment cleaning during exercise session. Flowsheet Row Cardiac Rehab from 04/04/2023 in [facility] Cardiac and Pulmonary Rehab  Date 03/25/23  Educator [facility]  Instruction Review Code 1- Verbalizes Understanding       Falls Prevention: - Provides verbal and written material to individual with discussion of falls prevention and safety. Flowsheet Row Cardiac Rehab from 04/04/2023 in [facility] Cardiac and Pulmonary Rehab  Date 03/25/23  Educator [facility]  Instruction Review Code 1- Verbalizes Understanding       Other: -Provides group and verbal instruction on  various topics (see comments)   Knowledge Questionnaire Score:  Knowledge Questionnaire Score - 04/04/23 1552       Knowledge Questionnaire Score   Pre Score 23/26             Core Components/Risk Factors/Patient Goals at Admission:  Personal Goals and Risk Factors at Admission - 03/25/23 1110       Core Components/Risk Factors/Patient Goals on Admission    Weight Management Yes;Weight  Maintenance    Intervention Weight Management: Develop a combined nutrition and exercise program designed to reach desired caloric intake, while maintaining appropriate intake of nutrient and fiber, sodium and fats, and appropriate energy expenditure required for the weight goal.;Weight Management: Provide education and appropriate resources to help participant work on and attain dietary goals.;Weight Management/Obesity: Establish reasonable short term and long term weight goals.    Expected Outcomes Short Term: Continue to assess and modify interventions until short term weight is achieved;Long Term: Adherence to nutrition and physical activity/exercise program aimed toward attainment of established weight goal;Weight Maintenance: Understanding of the daily nutrition guidelines, which includes 25-35% calories from fat, 7% or less cal from saturated fats, less than 200mg  cholesterol, less than 1.5gm of sodium, & 5 or more servings of fruits and vegetables daily;Understanding recommendations for meals to include 15-35% energy as protein, 25-35% energy from fat, 35-60% energy from carbohydrates, less than 200mg  of dietary cholesterol, 20-35 gm of total fiber daily;Understanding of distribution of calorie intake throughout the day with the consumption of 4-5 meals/snacks    Heart Failure Yes    Intervention Provide a combined exercise and nutrition program that is supplemented with education, support and counseling about heart failure. Directed toward relieving symptoms such as shortness of breath, decreased exercise tolerance, and extremity edema.    Expected Outcomes Improve functional capacity of life;Short term: Attendance in program 2-3 days a week with increased exercise capacity. Reported lower sodium intake. Reported increased fruit and vegetable intake. Reports medication compliance.;Short term: Daily weights obtained and reported for increase. Utilizing diuretic protocols set by physician.;Long term:  Adoption of self-care skills and reduction of barriers for early signs and symptoms recognition and intervention leading to self-care maintenance.    Hypertension Yes    Intervention Provide education on lifestyle modifcations including regular physical activity/exercise, weight management, moderate sodium restriction and increased consumption of fresh fruit, vegetables, and low fat dairy, alcohol moderation, and smoking cessation.;Monitor prescription use compliance.    Expected Outcomes Short Term: Continued assessment and intervention until BP is < 140/49mm HG in hypertensive participants. < 130/39mm HG in hypertensive participants with diabetes, heart failure or chronic kidney disease.;Long Term: Maintenance of blood pressure at goal levels.    Lipids Yes    Intervention Provide education and support for participant on nutrition & aerobic/resistive exercise along with prescribed medications to achieve LDL 70mg , HDL >40mg .    Expected Outcomes Short Term: Participant states understanding of desired cholesterol values and is compliant with medications prescribed. Participant is following exercise prescription and nutrition guidelines.;Long Term: Cholesterol controlled with medications as prescribed, with individualized exercise RX and with personalized nutrition plan. Value goals: LDL < 70mg , HDL > 40 mg.             Education:Diabetes - Individual verbal and written instruction to review signs/symptoms of diabetes, desired ranges of glucose level fasting, after meals and with exercise. Acknowledge that pre and post exercise glucose checks will be done for 3 sessions at entry of program.   Core Components/Risk Factors/Patient Goals Review:  Core Components/Risk Factors/Patient Goals at Discharge (Final Review):    ITP Comments:  ITP Comments     Row Name 03/25/23 1109 04/04/23 1539 04/08/23 1542 04/17/23 1207 04/22/23 1441   ITP Comments Virtual Visit completed. Patient informed on  EP and RD appointment and 6 Minute walk test. Patient also informed of patient health questionnaires on My Chart. Patient Verbalizes understanding. Visit diagnosis can be found in [facility] 02/15/2023. Completed and gym orientation. Initial ITP created and sent for review to Dr. Bethann Punches, Medical Director. First full day of exercise!  Patient was oriented to gym and equipment including functions, settings, policies, and procedures.  Patient's individual exercise prescription and treatment plan were reviewed.  All starting workloads were established based on the results of the 6 minute walk test done at initial orientation visit.  The plan for exercise progression was also introduced and progression will be customized based on patient's performance and goals. 30 Day review completed. Medical Director ITP review done, changes made as directed, and signed approval by Medical Director.    new to program [place] called rehab and asked to discharge from the program. She does not wish to continue at this time.            Comments: Discharge ITP

## 2023-05-02 NOTE — Progress Notes (Deleted)
Cardiology Office Note:  .   Date:  05/02/2023  ID:  Karina Freeman, DOB 07/23/1950, MRN [id number] PCP: Reubin Milan, MD  [facility] Providers Cardiologist:  Lorine Bears, MD { Click to update primary MD,subspecialty MD or APP then REFRESH:1}   History of Present Illness: .   Karina Freeman is a 72 y.o. female with a past medical history of CAD status post anterior STEMI and LAD PCI, HFrEF/ICM, hypertension, hyperlipidemia, CVA, GERD, depression/anxiety, and osteoporosis, who is here today for follow-up.  She previously presented to the [facility] emergency department on 02/15/23 with complaints of chest discomfort dependent anterior STEMI, high-sensitivity troponin peaked at 1235.  Emergent cardiac catheterization was indicated and revealed significant two-vessel CAD, 99% stenosis to the mid LAD, 80% stenosis to the proximal/mid RCA she underwent successful PCI/DES to the mid LAD, long stent was used to cover second LAD lesion after second diagonal.  It was recommended she have staged RCA PCI outpatient in a few weeks.  Following MI, her LVEF was 30-25% by echocardiogram and she was discharged after being initiated on GDMT on 02/17/23.  On 02/20/2023 she had sudden onset of substernal chest pressure and heaviness not relieved with aspirin and nitroglycerin.  She stated that it resolved after 7 hours.  On 8/8 she underwent repeat LHC that indicated a focal dissection at the proximal edge of the proximal/mid LAD, successful PCI/DES to the proximal LAD has dissection of that overlap with previous stent placement.  Recommend continuing DAPT with aspirin and Brilinta for minimum of 12 months.  Repeat echocardiogram revealed an LVEF of 65-70% with normal LV wall function.  She was started on spironolactone and her losartan was increased during admission and she was subsequently able to be discharged on 02/22/2023.  Follow-up email on 02/28/2023 indicated increase in serum creatinine 1.31 up from a  baseline of 0.87.  She was instructed to hold spironolactone and decrease the losartan.  She was last seen in clinic 03/07/2023.  At that time she reported doing fairly well she had stage her activity significantly following discharge.  But denied any chest pain, shortness of breath, lower extremity edema, or palpitations.  Reported that her blood pressures have been elevated 140s to 160s systolic.  She was referred to cardiac rehab, the female was rechecked, losartan was increased to 50 mg.  She returns to clinic today  ROS: 10 point review of systems has been reviewed and considered negative with exception of what is been listed in the HPI  Studies Reviewed: .       Limited TTE 02/22/23 1. Left ventricular ejection fraction, by estimation, is 65 to 70%. The  left ventricle has normal function.   2. Right ventricular systolic function is normal. The right ventricular  size is normal.   3. The mitral valve is normal in structure. Mild mitral valve  regurgitation.   4. The inferior vena cava is normal in size with greater than 50%  respiratory variability, suggesting right atrial pressure of 3 mmHg.   LHC 02/21/23 Conclusions: Multivessel coronary artery disease, as detailed below.  Mid/distal LAD, LCx, and RCA disease is similar to last catheterization.  Moderate to severe RCA disease (previously described as up to 80% stenosis, is not hemodynamically significant; RFR = 0.98). Focal dissection at proximal edge of recently placed proximal/mid LAD stent extending into the media. Normal left ventricular filling pressure (LVEDP 10 mmHg). Successful OCT-guided PCI to proximal LAD edge-dissection using Onyx Frontier 3.0 x 8 mm drug-eluting  stent (overlaps with previously placed stent) with 0% residual stenosis and TIMI-3 flow.   Recommendations: Continue dual antiplatelet therapy with aspirin and ticagrelor for at least 12 months. Aggressive secondary prevention of coronary artery disease.  Defer  staged PCI to RCA given reassuring functional assessment (RFR = 0.98). Repeat echo to ensure LVEF has not dropped further.  TTE 02/15/23 1. Left ventricular ejection fraction, by estimation, is 30 to 35%. Left  ventricular ejection fraction by 2D MOD biplane is 33.8 %. The left  ventricle has moderate to severely decreased function. The left ventricle  demonstrates regional wall motion  abnormalities (see scoring diagram/findings for description). There is  mild left ventricular hypertrophy. Left ventricular diastolic parameters  are consistent with Grade I diastolic dysfunction (impaired relaxation).  There is akinesis of the left  ventricular, entire anteroseptal wall. There is akinesis of the left  ventricular, mid-apical anterior segment.   2. Right ventricular systolic function is normal. The right ventricular  size is normal.   3. The mitral valve is normal in structure. Mild mitral valve  regurgitation.   4. The aortic valve is calcified. Aortic valve regurgitation is not  visualized.   LHC 02/15/23   Prox RCA to Mid RCA lesion is 80% stenosed.   Ost Cx to Prox Cx lesion is 30% stenosed.   LPAV lesion is 50% stenosed.   Mid LAD-1 lesion is 99% stenosed.   Mid LAD-2 lesion is 70% stenosed.   2nd Diag lesion is 40% stenosed.   Dist LAD lesion is 50% stenosed.   Mid LAD-3 lesion is 30% stenosed.   A drug-eluting stent was successfully placed using a STENT ONYX FRONTIER 3.0X34.   Post intervention, there is a 0% residual stenosis.   Post intervention, there is a 0% residual stenosis.   There is moderate left ventricular systolic dysfunction.   LV end diastolic pressure is normal.   The left ventricular ejection fraction is 35-45% by visual estimate.   As long as the patient continues to meet low risk STEMI criteria and in the absence of any other complications or medical issues, we expect the patient to be ready for discharge on 02/16/2023.   Recommend uninterrupted dual  antiplatelet therapy with Aspirin 81mg  daily and Ticagrelor 90mg  twice daily for a minimum of 12 months (ACS-Class I recommendation).   1.  Significant two-vessel coronary artery disease.  The culprit for anterior STEMI is 99% stenosis in the mid LAD.  There is also 80% stenosis in the proximal/mid right coronary artery. 2.  Moderately reduced LV systolic function with normal left ventricular end-diastolic pressure. 3.  Successful angioplasty and drug-eluting stent placement to the mid LAD.  A long stent was used to cover the second lesion shortly after second diagonal which was jailed by the stent but had normal TIMI-3 flow.   Recommendations: Dual antiplatelet therapy for 12 months. Aggressive treatment of risk factors. Staged RCA PCI is recommended in the outpatient in few weeks. The patient is a candidate for accelerated discharge in 24-36 hours if her EF is greater than 35% by echo. Risk Assessment/Calculations:     No BP recorded.  {Refresh Note OR Click here to enter BP  :1}***       Physical Exam:   VS:  LMP  (LMP Unknown)    Wt Readings from Last 3 Encounters:  04/04/23 154 lb 6.4 oz (70 kg)  03/26/23 158 lb (71.7 kg)  03/07/23 162 lb (73.5 kg)    GEN: Well nourished, well  developed in no acute distress NECK: No JVD; No carotid bruits CARDIAC: ***RRR, no murmurs, rubs, gallops RESPIRATORY:  Clear to auscultation without rales, wheezing or rhonchi  ABDOMEN: Soft, non-tender, non-distended EXTREMITIES:  No edema; No deformity   ASSESSMENT AND PLAN: .   ***    {Are you ordering a CV Procedure (e.g. stress test, cath, DCCV, TEE, etc)?   Press F2        :[id number]}  Dispo: ***  Signed, Lateefa Crosby, NP

## 2023-05-03 ENCOUNTER — Ambulatory Visit: Payer: Self-pay

## 2023-05-03 ENCOUNTER — Emergency Department: Payer: Medicare Other

## 2023-05-03 ENCOUNTER — Emergency Department (EMERGENCY_DEPARTMENT_HOSPITAL)
Admission: EM | Admit: 2023-05-03 | Discharge: 2023-05-04 | Disposition: A | Discharge status: Other Acute Inpatient Hospital | Payer: Medicare Other | Source: Home / Self Care | Attending: Emergency Medicine | Admitting: Emergency Medicine

## 2023-05-03 ENCOUNTER — Other Ambulatory Visit: Payer: Self-pay

## 2023-05-03 DIAGNOSIS — N189 Chronic kidney disease, unspecified: Secondary | ICD-10-CM | POA: Insufficient documentation

## 2023-05-03 DIAGNOSIS — F330 Major depressive disorder, recurrent, mild: Secondary | ICD-10-CM | POA: Insufficient documentation

## 2023-05-03 DIAGNOSIS — R4182 Altered mental status, unspecified: Secondary | ICD-10-CM | POA: Insufficient documentation

## 2023-05-03 DIAGNOSIS — F411 Generalized anxiety disorder: Secondary | ICD-10-CM

## 2023-05-03 DIAGNOSIS — I129 Hypertensive chronic kidney disease with stage 1 through stage 4 chronic kidney disease, or unspecified chronic kidney disease: Secondary | ICD-10-CM | POA: Insufficient documentation

## 2023-05-03 DIAGNOSIS — I6782 Cerebral ischemia: Secondary | ICD-10-CM | POA: Diagnosis not present

## 2023-05-03 DIAGNOSIS — I2510 Atherosclerotic heart disease of native coronary artery without angina pectoris: Secondary | ICD-10-CM | POA: Insufficient documentation

## 2023-05-03 DIAGNOSIS — F32A Depression, unspecified: Secondary | ICD-10-CM | POA: Diagnosis not present

## 2023-05-03 DIAGNOSIS — F419 Anxiety disorder, unspecified: Secondary | ICD-10-CM | POA: Diagnosis not present

## 2023-05-03 DIAGNOSIS — F322 Major depressive disorder, single episode, severe without psychotic features: Secondary | ICD-10-CM | POA: Diagnosis present

## 2023-05-03 DIAGNOSIS — R45851 Suicidal ideations: Secondary | ICD-10-CM

## 2023-05-03 DIAGNOSIS — F05 Delirium due to known physiological condition: Secondary | ICD-10-CM | POA: Diagnosis not present

## 2023-05-03 DIAGNOSIS — Z8673 Personal history of transient ischemic attack (TIA), and cerebral infarction without residual deficits: Secondary | ICD-10-CM

## 2023-05-03 LAB — URINALYSIS, ROUTINE W REFLEX MICROSCOPIC
Bilirubin Urine: NEGATIVE
Glucose, UA: NEGATIVE mg/dL
Ketones, ur: NEGATIVE mg/dL
Nitrite: NEGATIVE
Protein, ur: NEGATIVE mg/dL
Specific Gravity, Urine: 1.01 (ref 1.005–1.030)
pH: 6 (ref 5.0–8.0)

## 2023-05-03 LAB — URINE DRUG SCREEN, QUALITATIVE (ARMC ONLY)
Amphetamines, Ur Screen: NOT DETECTED
Barbiturates, Ur Screen: NOT DETECTED
Benzodiazepine, Ur Scrn: NOT DETECTED
Cannabinoid 50 Ng, Ur ~~LOC~~: NOT DETECTED
Cocaine Metabolite,Ur ~~LOC~~: NOT DETECTED
MDMA (Ecstasy)Ur Screen: NOT DETECTED
Methadone Scn, Ur: NOT DETECTED
Opiate, Ur Screen: NOT DETECTED
Phencyclidine (PCP) Ur S: NOT DETECTED
Tricyclic, Ur Screen: NOT DETECTED

## 2023-05-03 LAB — CBC WITH DIFFERENTIAL/PLATELET
Abs Immature Granulocytes: 0.02 10*3/uL (ref 0.00–0.07)
Basophils Absolute: 0.1 10*3/uL (ref 0.0–0.1)
Basophils Relative: 1 %
Eosinophils Absolute: 0.4 10*3/uL (ref 0.0–0.5)
Eosinophils Relative: 4 %
HCT: 39.5 % (ref 36.0–46.0)
Hemoglobin: 12.8 g/dL (ref 12.0–15.0)
Immature Granulocytes: 0 %
Lymphocytes Relative: 20 %
Lymphs Abs: 1.9 10*3/uL (ref 0.7–4.0)
MCH: 30.7 pg (ref 26.0–34.0)
MCHC: 32.4 g/dL (ref 30.0–36.0)
MCV: 94.7 fL (ref 80.0–100.0)
Monocytes Absolute: 0.4 10*3/uL (ref 0.1–1.0)
Monocytes Relative: 4 %
Neutro Abs: 6.7 10*3/uL (ref 1.7–7.7)
Neutrophils Relative %: 71 %
Platelets: 269 10*3/uL (ref 150–400)
RBC: 4.17 MIL/uL (ref 3.87–5.11)
RDW: 12.8 % (ref 11.5–15.5)
WBC: 9.5 10*3/uL (ref 4.0–10.5)
nRBC: 0 % (ref 0.0–0.2)

## 2023-05-03 LAB — COMPREHENSIVE METABOLIC PANEL
ALT: 20 U/L (ref 0–44)
AST: 19 U/L (ref 15–41)
Albumin: 4.1 g/dL (ref 3.5–5.0)
Alkaline Phosphatase: 72 U/L (ref 38–126)
Anion gap: 10 (ref 5–15)
BUN: 11 mg/dL (ref 8–23)
CO2: 27 mmol/L (ref 22–32)
Calcium: 8.7 mg/dL — ABNORMAL LOW (ref 8.9–10.3)
Chloride: 103 mmol/L (ref 98–111)
Creatinine, Ser: 1 mg/dL (ref 0.44–1.00)
GFR, Estimated: 60 mL/min — ABNORMAL LOW (ref >60)
Glucose, Bld: 108 mg/dL — ABNORMAL HIGH (ref 70–99)
Potassium: 3.6 mmol/L (ref 3.5–5.1)
Sodium: 140 mmol/L (ref 135–145)
Total Bilirubin: 1 mg/dL (ref 0.3–1.2)
Total Protein: 7.8 g/dL (ref 6.5–8.1)

## 2023-05-03 LAB — ETHANOL: Alcohol, Ethyl (B): <10 mg/dL (ref <10)

## 2023-05-03 LAB — ACETAMINOPHEN LEVEL: Acetaminophen (Tylenol), Serum: <10 ug/mL — ABNORMAL LOW (ref 10–30)

## 2023-05-03 LAB — SALICYLATE LEVEL: Salicylate Lvl: <7 mg/dL — ABNORMAL LOW (ref 7.0–30.0)

## 2023-05-03 MED ORDER — CARVEDILOL 12.5 MG PO TABS
12.5000 mg | ORAL_TABLET | Freq: Two times a day (BID) | ORAL | Status: DC
  Administered 2023-05-04: 12.5 mg via ORAL
  Filled 2023-05-03: qty 1

## 2023-05-03 MED ORDER — TICAGRELOR 90 MG PO TABS
90.0000 mg | ORAL_TABLET | Freq: Two times a day (BID) | ORAL | Status: DC
  Administered 2023-05-03 – 2023-05-04 (×2): 90 mg via ORAL
  Filled 2023-05-03 (×2): qty 1

## 2023-05-03 MED ORDER — ATORVASTATIN CALCIUM 20 MG PO TABS
80.0000 mg | ORAL_TABLET | Freq: Every day | ORAL | Status: DC
  Administered 2023-05-04: 80 mg via ORAL
  Filled 2023-05-03: qty 4

## 2023-05-03 MED ORDER — LOSARTAN POTASSIUM 50 MG PO TABS
50.0000 mg | ORAL_TABLET | Freq: Every day | ORAL | Status: DC
  Administered 2023-05-04: 50 mg via ORAL
  Filled 2023-05-03: qty 1

## 2023-05-03 MED ORDER — ASPIRIN 81 MG PO CHEW
81.0000 mg | CHEWABLE_TABLET | Freq: Every day | ORAL | Status: DC
  Administered 2023-05-04: 81 mg via ORAL
  Filled 2023-05-03: qty 1

## 2023-05-03 MED ORDER — AMLODIPINE BESYLATE 5 MG PO TABS
5.0000 mg | ORAL_TABLET | Freq: Every day | ORAL | Status: DC
  Administered 2023-05-03 – 2023-05-04 (×2): 5 mg via ORAL
  Filled 2023-05-03 (×2): qty 1

## 2023-05-03 NOTE — ED Notes (Signed)
Called for security escort to [facility]
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
## 2023-05-03 NOTE — ED Provider Notes (Addendum)
Care assumed of patient from outgoing provider.  See their note for initial history, exam and plan.  CT scan of the head was read as no acute findings.  Patient medically cleared.  Currently waiting for psychiatric evaluation.  Evaluated by psychiatry.  Recommended admission for inpatient [facility]      Corena Herter, MD 05/03/23 [id number]    Corena Herter, MD 05/03/23 2155
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
## 2023-05-03 NOTE — Telephone Encounter (Signed)
Noted pt Is going to the ED.  KP

## 2023-05-03 NOTE — ED Notes (Signed)
Dress out: White socks White tennis shoes Wedding ring Gold watch Psychologist, occupational top Jeans Nude underwear Red bra   All pt's belongings sent home with pt's husband  Jalicia Schwartzenberge -   [phone]

## 2023-05-03 NOTE — ED Triage Notes (Signed)
ARrives with C/O 2 week histoyr of anxiety vs forgetful ness.  Husband describes patient forgetting how to do things intermittently.  AAOx3.  Skin warm and dry. NAD

## 2023-05-03 NOTE — ED Notes (Signed)
MD medically cleared patient at this time.
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
## 2023-05-03 NOTE — ED Notes (Addendum)
TTS in progress. Will move patient to [facility] when complete

## 2023-05-03 NOTE — ED Notes (Signed)
Report given to [place], RN in [place].
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
## 2023-05-03 NOTE — ED Notes (Signed)
Pt was placed under IVC so that she would be unable to leave. Low suspicion for SI. Two family members at bedside. Pt in room near nurses station with door open. MD aware and comfortable with plan. Charge RN aware. Will place in psych room when medically cleared. Frequent rounding will be performed.

## 2023-05-03 NOTE — ED Notes (Signed)
Pt gone to CT at this time.

## 2023-05-04 ENCOUNTER — Inpatient Hospital Stay
Admission: AD | Admit: 2023-05-04 | Discharge: 2023-05-09 | DRG: 885 | Disposition: A | Discharge status: Home / Self Care | Payer: Medicare Other | Source: Intra-hospital | Attending: Psychiatry | Admitting: Psychiatry

## 2023-05-04 ENCOUNTER — Encounter: Payer: Self-pay | Admitting: Psychiatric/Mental Health

## 2023-05-04 DIAGNOSIS — I252 Old myocardial infarction: Secondary | ICD-10-CM | POA: Diagnosis not present

## 2023-05-04 DIAGNOSIS — Z8249 Family history of ischemic heart disease and other diseases of the circulatory system: Secondary | ICD-10-CM | POA: Diagnosis not present

## 2023-05-04 DIAGNOSIS — R45851 Suicidal ideations: Secondary | ICD-10-CM

## 2023-05-04 DIAGNOSIS — M810 Age-related osteoporosis without current pathological fracture: Secondary | ICD-10-CM | POA: Diagnosis present

## 2023-05-04 DIAGNOSIS — Z955 Presence of coronary angioplasty implant and graft: Secondary | ICD-10-CM

## 2023-05-04 DIAGNOSIS — I10 Essential (primary) hypertension: Secondary | ICD-10-CM | POA: Diagnosis present

## 2023-05-04 DIAGNOSIS — Z885 Allergy status to narcotic agent status: Secondary | ICD-10-CM

## 2023-05-04 DIAGNOSIS — F322 Major depressive disorder, single episode, severe without psychotic features: Principal | ICD-10-CM | POA: Diagnosis present

## 2023-05-04 DIAGNOSIS — F411 Generalized anxiety disorder: Secondary | ICD-10-CM | POA: Diagnosis present

## 2023-05-04 DIAGNOSIS — F05 Delirium due to known physiological condition: Secondary | ICD-10-CM

## 2023-05-04 DIAGNOSIS — Z79899 Other long term (current) drug therapy: Secondary | ICD-10-CM | POA: Diagnosis not present

## 2023-05-04 DIAGNOSIS — F32A Depression, unspecified: Principal | ICD-10-CM | POA: Diagnosis present

## 2023-05-04 DIAGNOSIS — Z803 Family history of malignant neoplasm of breast: Secondary | ICD-10-CM

## 2023-05-04 DIAGNOSIS — I5022 Chronic systolic (congestive) heart failure: Secondary | ICD-10-CM | POA: Diagnosis present

## 2023-05-04 DIAGNOSIS — E785 Hyperlipidemia, unspecified: Secondary | ICD-10-CM | POA: Diagnosis present

## 2023-05-04 DIAGNOSIS — Z818 Family history of other mental and behavioral disorders: Secondary | ICD-10-CM

## 2023-05-04 DIAGNOSIS — Z888 Allergy status to other drugs, medicaments and biological substances status: Secondary | ICD-10-CM | POA: Diagnosis not present

## 2023-05-04 DIAGNOSIS — Z8673 Personal history of transient ischemic attack (TIA), and cerebral infarction without residual deficits: Secondary | ICD-10-CM

## 2023-05-04 DIAGNOSIS — Z880 Allergy status to penicillin: Secondary | ICD-10-CM | POA: Diagnosis not present

## 2023-05-04 DIAGNOSIS — Z7982 Long term (current) use of aspirin: Secondary | ICD-10-CM

## 2023-05-04 DIAGNOSIS — Z87891 Personal history of nicotine dependence: Secondary | ICD-10-CM

## 2023-05-04 DIAGNOSIS — F419 Anxiety disorder, unspecified: Secondary | ICD-10-CM | POA: Diagnosis present

## 2023-05-04 DIAGNOSIS — I2510 Atherosclerotic heart disease of native coronary artery without angina pectoris: Secondary | ICD-10-CM | POA: Diagnosis present

## 2023-05-04 MED ORDER — CARVEDILOL 6.25 MG PO TABS
12.5000 mg | ORAL_TABLET | Freq: Two times a day (BID) | ORAL | Status: DC
  Administered 2023-05-04 – 2023-05-09 (×9): 12.5 mg via ORAL
  Filled 2023-05-04 (×10): qty 2

## 2023-05-04 MED ORDER — AMLODIPINE BESYLATE 5 MG PO TABS
5.0000 mg | ORAL_TABLET | Freq: Every day | ORAL | Status: DC
  Administered 2023-05-05 – 2023-05-09 (×5): 5 mg via ORAL
  Filled 2023-05-04 (×5): qty 1

## 2023-05-04 MED ORDER — ATORVASTATIN CALCIUM 80 MG PO TABS
80.0000 mg | ORAL_TABLET | Freq: Every day | ORAL | Status: DC
  Administered 2023-05-05 – 2023-05-09 (×5): 80 mg via ORAL
  Filled 2023-05-04 (×5): qty 1

## 2023-05-04 MED ORDER — DIPHENHYDRAMINE HCL 50 MG/ML IJ SOLN: 50.0000 mg | Freq: Three times a day (TID) | INTRAMUSCULAR | Status: DC | PRN

## 2023-05-04 MED ORDER — ALUM & MAG HYDROXIDE-SIMETH 200-200-20 MG/5ML PO SUSP: 30.0000 mL | ORAL | Status: DC | PRN

## 2023-05-04 MED ORDER — LORAZEPAM 2 MG/ML IJ SOLN: 2.0000 mg | Freq: Three times a day (TID) | INTRAMUSCULAR | Status: DC | PRN

## 2023-05-04 MED ORDER — MAGNESIUM HYDROXIDE 400 MG/5ML PO SUSP: 30.0000 mL | Freq: Every day | ORAL | Status: DC | PRN

## 2023-05-04 MED ORDER — TRAZODONE HCL 50 MG PO TABS
50.0000 mg | ORAL_TABLET | Freq: Every evening | ORAL | Status: DC | PRN
  Administered 2023-05-07 – 2023-05-08 (×2): 50 mg via ORAL
  Filled 2023-05-04 (×4): qty 1

## 2023-05-04 MED ORDER — HALOPERIDOL LACTATE 5 MG/ML IJ SOLN: 5.0000 mg | Freq: Three times a day (TID) | INTRAMUSCULAR | Status: DC | PRN

## 2023-05-04 MED ORDER — ACETAMINOPHEN 325 MG PO TABS
650.0000 mg | ORAL_TABLET | Freq: Four times a day (QID) | ORAL | Status: DC | PRN
  Administered 2023-05-08: 650 mg via ORAL
  Filled 2023-05-04: qty 2

## 2023-05-04 MED ORDER — HYDROXYZINE HCL 25 MG PO TABS
25.0000 mg | ORAL_TABLET | Freq: Three times a day (TID) | ORAL | Status: DC | PRN
  Filled 2023-05-04: qty 1

## 2023-05-04 MED ORDER — LOSARTAN POTASSIUM 25 MG PO TABS
50.0000 mg | ORAL_TABLET | Freq: Every day | ORAL | Status: DC
  Administered 2023-05-05 – 2023-05-09 (×5): 50 mg via ORAL
  Filled 2023-05-04 (×5): qty 2

## 2023-05-04 MED ORDER — HALOPERIDOL 5 MG PO TABS: 5.0000 mg | ORAL_TABLET | Freq: Three times a day (TID) | ORAL | Status: DC | PRN

## 2023-05-04 MED ORDER — ASPIRIN 81 MG PO CHEW
81.0000 mg | CHEWABLE_TABLET | Freq: Every day | ORAL | Status: DC
  Administered 2023-05-05 – 2023-05-09 (×5): 81 mg via ORAL
  Filled 2023-05-04 (×5): qty 1

## 2023-05-04 MED ORDER — LORAZEPAM 1 MG PO TABS: 2.0000 mg | ORAL_TABLET | Freq: Three times a day (TID) | ORAL | Status: DC | PRN

## 2023-05-04 MED ORDER — TICAGRELOR 90 MG PO TABS
90.0000 mg | ORAL_TABLET | Freq: Two times a day (BID) | ORAL | Status: DC
  Administered 2023-05-04 – 2023-05-09 (×10): 90 mg via ORAL
  Filled 2023-05-04 (×11): qty 1

## 2023-05-04 MED ORDER — DIPHENHYDRAMINE HCL 25 MG PO CAPS: 50.0000 mg | ORAL_CAPSULE | Freq: Three times a day (TID) | ORAL | Status: DC | PRN
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
## 2023-05-04 NOTE — ED Notes (Addendum)
Pt going to [place] unit, report called in to [employer]. Pt educated on inpatient admission and she agreed to it. Pt endorses frustration with her inability to remember things stating "it is so frustrating forgetting everything". Pt observed having difficulty and searching for words to express herself and staff has to be patient with her. EDP Dr Derrill Kay notified of elevated BP and he says ok for pt to transfer to inpatient bed as pt has already received BP meds this a.m. Stable, ambulatory without difficulty and in NAD. Transferred with all her personal belongings. Escorted by Harvin Hazel, NT via w/c and security.
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
## 2023-05-04 NOTE — Group Note (Signed)
Date:  05/04/2023 Time:  6:31 PM  Group Topic/Focus:  Coping With Mental Health Crisis:   The purpose of this group is to help patients identify strategies for coping with mental health crisis.  Group discusses possible causes of crisis and ways to manage them effectively.    Participation Level:  Active  Participation Quality:  Appropriate  Affect:  Appropriate  Cognitive:  Appropriate  Insight: Appropriate  Engagement in Group:  Engaged  Modes of Intervention:  Activity  Additional Comments:    Mary Sella Shyam Dawson 05/04/2023, 6:31 PM

## 2023-05-04 NOTE — ED Notes (Signed)
Breakfast was given 

## 2023-05-04 NOTE — ED Notes (Signed)
IVC [place] and Petition / First Examination was scanned and e-filed to [employer] the magistrate was only getting first exam said never got petition after the third try the magistrate said send it by an Technical sales engineer /papers picked up by sheriff patient moved to [employer] [facility]

## 2023-05-04 NOTE — ED Notes (Signed)
Sheriff was sent with copies of ivc came back said the magistrate needed the original copies I told them I had to have them back they came back said the magistrate said he had to have them to file I told them when we e file them they are not the original and when we were faxing them it was not the original

## 2023-05-04 NOTE — Group Note (Signed)
Date:  05/04/2023 Time:  4:31 PM  Group Topic/Focus:  Activity Group:  The focus of the group is to encourage patients to step outside in the courtyard to get some fresh air and some exercise for the benefit of their mental health.    Participation Level:  Did Not Attend   Karina Freeman 05/04/2023, 4:31 PM

## 2023-05-04 NOTE — Tx Team (Signed)
Initial Treatment Plan 05/04/2023 2:21 PM SHEANA ANDARY ZOX:[id number]    PATIENT STRESSORS: Health problems   Other: recent MI and onset of "forgetfulness"     PATIENT STRENGTHS: Average or above average intelligence  Financial means  Motivation for treatment/growth  Supportive family/friends    PATIENT IDENTIFIED PROBLEMS: On and off forgetfulness since MI August 2024                     DISCHARGE CRITERIA:  Motivation to continue treatment in a less acute level of care Verbal commitment to aftercare and medication compliance  PRELIMINARY DISCHARGE PLAN: Attend aftercare/continuing care group Return to previous living arrangement  PATIENT/FAMILY INVOLVEMENT: This treatment plan has been presented to and reviewed with the patient, [name], .  The patient and family have been given the opportunity to ask questions and make suggestions.  Velna Hatchet, RN 05/04/2023, 2:21 PM

## 2023-05-04 NOTE — Group Note (Signed)
Date:  05/04/2023 Time:  11:47 PM  Group Topic/Focus:  Developing a Wellness Toolbox:   The focus of this group is to help patients develop a "wellness toolbox" with skills and strategies to promote recovery upon discharge.    Participation Level:  Active  Participation Quality:  Appropriate  Affect:  Appropriate  Cognitive:  Appropriate  Insight: Appropriate  Engagement in Group:  Engaged  Modes of Intervention:  Education  Additional Comments:    Garry Heater 05/04/2023, 11:47 PM

## 2023-05-04 NOTE — ED Provider Notes (Signed)
Emergency Medicine Observation Re-evaluation Note  Karina Freeman is a 72 y.o. female, awaiting psych dispo  Physical Exam  BP 117/74   Pulse 67   Temp 98.6 F (37 C) (Oral)   Resp 18   Wt 70 kg   LMP  (LMP Unknown)   SpO2 98%   BMI 23.81 kg/m  Physical Exam General: resting comfortably   ED Course / MDM  EKG:   I have reviewed the labs performed to date as well as medications administered while in observation.  Plan  Current plan is for psych dispo.    Phineas Semen, MD 05/04/23 [phone]

## 2023-05-04 NOTE — Progress Notes (Signed)
Patient continued throughout shift to be calm and cooperative. Mostly sat at the table in the dayroom staring off with a flat expression.  Will answer questions when asked. Still denies SI, HI, AVH. Will continue to monitor. 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 Unsure if there was any residual to these strokes.

## 2023-05-04 NOTE — BHH Suicide Risk Assessment (Signed)
[facility] Admission Suicide Risk Assessment   Nursing information obtained from:  Patient Demographic factors:  Age 72 or older, Caucasian, Access to firearms Loss Factors:  Decline in physical health Historical Factors:  NA Risk Reduction Factors:  Sense of responsibility to family, Living with another person, especially a relative   Principal Problem: Depression Diagnosis:  Principal Problem:   Depression  Subjective Data: Karina Freeman presented to the ED complaining of anxiety.  Husband at bedside reports that patient has been feeling increasingly anxious for the past couple of weeks with some intermittent confusion.  He states that she has seemed to have difficulty with tasks that she typically has no problem with, such as doing the laundry   Continued Clinical Symptoms:    The "Alcohol Use Disorders Identification Test", Guidelines for Use in Primary Care, Second Edition.  World Science writer [facility]). Score between 0-7:  no or low risk or alcohol related problems. Score between 8-15:  moderate risk of alcohol related problems. Score between 16-19:  high risk of alcohol related problems. Score 20 or above:  warrants further diagnostic evaluation for alcohol dependence and treatment.   CLINICAL FACTORS:   Dysthymia   Musculoskeletal: Strength & Muscle Tone: within normal limits Gait & Station:  Programme researcher, broadcasting/film/video Patient leans: N/A  Psychiatric Specialty Exam:  Presentation  General Appearance:  Appropriate for Environment  Eye Contact: Fair  Speech: Slow; Other (comment) (Scanned)  Speech Volume: Decreased  Handedness: Right   Mood and Affect  Mood: -- (Fine)  Affect: Constricted   Thought Process  Thought Processes: Disorganized (Slowed, blocked)  Descriptions of Associations:Tangential  Orientation:Full (Time, Place and Person)  Thought Content:Scattered  History of Schizophrenia/Schizoaffective disorder:No  Duration of Psychotic  Symptoms:NA Hallucinations:Hallucinations: None  Ideas of Reference:None  Suicidal Thoughts:Suicidal Thoughts: No  Homicidal Thoughts:Homicidal Thoughts: No   Sensorium  Memory: Other (comment) (Impaired)  Judgment: Impaired  Insight: Fair   Art therapist  Concentration: Poor  Attention Span: Poor  Recall: Poor  Fund of Knowledge: Fair  Language: Fair   Psychomotor Activity  Psychomotor Activity: Psychomotor Activity: Decreased   Assets  Assets: Desire for Improvement; Social Support; Manufacturing systems engineer; Housing   Sleep  Sleep: Sleep: Fair    Physical Exam: Physical Exam Constitutional:      Appearance: Normal appearance.  HENT:     Head: Normocephalic and atraumatic.     Nose: Nose normal.  Eyes:     Pupils: Pupils are equal, round, and reactive to light.  Cardiovascular:     Rate and Rhythm: Regular rhythm.  Pulmonary:     Effort: Pulmonary effort is normal.  Skin:    General: Skin is warm.  Neurological:     General: No focal deficit present.     Mental Status: She is alert and oriented to person, place, and time.    Review of Systems  Constitutional:  Negative for chills and fever.  HENT:  Negative for congestion and sore throat.   Eyes:  Negative for blurred vision and double vision.  Respiratory:  Negative for cough and shortness of breath.   Cardiovascular:  Negative for chest pain and palpitations.  Gastrointestinal:  Negative for nausea and vomiting.  Neurological:  Negative for headaches.  Psychiatric/Behavioral:  Positive for depression and memory loss. Negative for hallucinations and suicidal ideas.    Blood pressure 131/82, pulse 61, temperature 97.8 F (36.6 C), temperature source Oral, resp. rate 16, height 5\' 7"  (1.702 m), weight 69.2 kg, SpO2 100%. Body mass index  is 23.88 kg/m.   COGNITIVE FEATURES THAT CONTRIBUTE TO RISK:  Loss of executive function and Thought constriction (tunnel vision)     SUICIDE RISK:   Moderate:  Frequent suicidal ideation with limited intensity, and duration, some specificity in terms of plans, no associated intent, good self-control, and identifiable protective factors, including available and accessible social support.  PLAN OF CARE: Per H&P  I certify that inpatient services furnished can reasonably be expected to improve the patient's condition.   Lewanda Rife, MD

## 2023-05-04 NOTE — ED Notes (Signed)
Pt in shower.  

## 2023-05-05 DIAGNOSIS — F322 Major depressive disorder, single episode, severe without psychotic features: Principal | ICD-10-CM | POA: Insufficient documentation

## 2023-05-05 MED ORDER — SERTRALINE HCL 25 MG PO TABS
25.0000 mg | ORAL_TABLET | Freq: Every day | ORAL | Status: DC
  Administered 2023-05-05 – 2023-05-07 (×3): 25 mg via ORAL
  Filled 2023-05-05 (×3): qty 1

## 2023-05-05 NOTE — Progress Notes (Signed)
   05/05/23 8921  15 Minute Checks  Location Dayroom  Visual Appearance Calm  Behavior Composed  Sleep (Behavioral Health Patients Only)  Calculate sleep? (Click Yes once per 24 hr at 0600 safety check) Yes  Documented sleep last 24 hours 5.25

## 2023-05-05 NOTE — Group Note (Signed)
Date:  05/05/2023 Time:  4:57 PM  Group Topic/Focus:  Activity Group:  The focus of the group is to promote activity for the patients and encourage them to go outside to the courtyard and get some fresh air and some exercise.    Participation Level:  Active  Participation Quality:  Appropriate  Affect:  Appropriate  Cognitive:  Appropriate  Insight: Appropriate  Engagement in Group:  Engaged  Modes of Intervention:  Activity  Additional Comments:    Mary Sella Lucille Witts 05/05/2023, 4:57 PM

## 2023-05-05 NOTE — Progress Notes (Signed)
Pt is alert and oriented at this time.Pt denies anxiety, depression, SI/HI/AVH and pain at this time.   Pt received scheduled medications as per MD orders. Support and encouragement provided . Frequent verbal made . Routine safety checks conducted q15.   No adverse drug reactions noted. Pt verbally contracts for safety at this time. Pt is complaint with medications and treatment pan. Pt interacts well with others on the unit. Pt remains safe at this time, Plan of care ongoing.

## 2023-05-05 NOTE — Plan of Care (Signed)
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
## 2023-05-05 NOTE — Progress Notes (Signed)
D- Patient alert and oriented. Flat and sad affect. Denies SI, HI, AVH, and pain.  A- Scheduled medications administered to patient, per MD orders. Support and encouragement provided.  Routine safety checks conducted every 15 minutes.  Patient informed to notify staff with problems or concerns. R- No adverse drug reactions noted. Patient contracts for safety at this time. Patient compliant with medications and treatment plan. Patient receptive, calm, and cooperative. Limited interaction with peers and staff. Visited with spouse. Patient remains safe at this time.

## 2023-05-05 NOTE — Progress Notes (Signed)
Patient is an involuntary admission to [facility] for MDD.  Patient has been calm, cooperative, in the day room most of the day coloring with her peers. She also joined them outside for a short time for group. Ambulates with her walker and seems more steady with her gait. Started on zoloft today by the doctor. Denies SI, HI, AVH, Anxiety and Depression. Will continue to monitor.

## 2023-05-05 NOTE — Group Note (Signed)
LCSW Group Therapy Note   Group Date: 05/05/2023 Start Time: 1240 End Time: 1330   Type of Therapy and Topic:  Group Therapy: Self-Care  Participation Level:  Active      Summary of Patient Progress:  The patient attended group. Patient proved open to input from peers and feedback from [facility]. The patient was respectful of peers and participated throughout the entire session. The patient participated during today's icebreaker questions. The patient stated that she uses coloring as her way to practice self-care.      Marshell Levan, LCSWA 05/05/2023  3:23 PM

## 2023-05-06 DIAGNOSIS — F322 Major depressive disorder, single episode, severe without psychotic features: Secondary | ICD-10-CM | POA: Diagnosis not present

## 2023-05-06 NOTE — Plan of Care (Signed)
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
## 2023-05-06 NOTE — Group Note (Signed)
Recreation Therapy Group Note   Group Topic:Emotion Expression  Group Date: 05/06/2023 Start Time: 1400 End Time: 1455 Facilitators: Rosina Lowenstein, LRT, CTRS Location: Courtyard  Group Description: Music Reminisce. LRT encouraged patients to think of their favorite song(s) that reminded them of a positive memory or time in their life. LRT encouraged patient to talk about that memory aloud to the group. LRT played the song through a speaker for all to hear. LRT and patients discussed how thinking of a positive memory or time in their life can be used as a coping skill in everyday life post discharge.   Goal Area(s) Addressed: Patient will increase verbal communication by conversing with peers. Patient will contribute to group discussion with minimal prompting. Patient will reminisce a positive memory or moment in their life.    Affect/Mood: N/A   Participation Level: Did not attend    Clinical Observations/Individualized Feedback: [facility] did not attend group.  Plan: Continue to engage patient in RT group sessions 2-3x/week.   Rosina Lowenstein, LRT, CTRS 05/06/2023 3:12 PM

## 2023-05-06 NOTE — Group Note (Signed)
  Date:  05/06/2023 Time:  3:39 AM  Group Topic/Focus:  Recovery Goals:   The focus of this group is to identify appropriate goals for recovery and establish a plan to achieve them.    Participation Level:  Active  Participation Quality:  Appropriate  Affect:  Appropriate  Cognitive:  Appropriate  Insight: Good  Engagement in Group:  Engaged  Modes of Intervention:  Discussion  Additional Comments:    [facility] 05/06/2023, 3:39 AM

## 2023-05-06 NOTE — Progress Notes (Signed)
Patient declined to come to dayroom for breakfast. Soft, slow speech. Flat, sad affect. Patient denied SI/HI and AVH.  Denies anxiety and depression.  Patient kept repeating "I just want to go home."   Patient came to dayroom for lunch, but did not eat.  Patient sat  with eyes closed.    Compliant with scheduled medications.  15 min checks in place for safety.  No  interaction with peers or staff.  Isolated to room.    Patient refused to leave room for phone calls or dinner despite attempts by multiple staff members. Dr. Marval Regal made aware.

## 2023-05-06 NOTE — Plan of Care (Signed)
  Problem: Pain Managment: Goal: General experience of comfort will improve Outcome: Progressing   Problem: Safety: Goal: Ability to remain free from injury will improve Outcome: Progressing   

## 2023-05-06 NOTE — BH IP Treatment Plan (Signed)
Interdisciplinary Treatment and Diagnostic Plan Update  05/06/2023 Time of Session: 9:49 AM  Karina Freeman MRN: [id number]  Principal Diagnosis: MDD (major depressive disorder), severe (HCC)  Secondary Diagnoses: Principal Problem:   MDD (major depressive disorder), severe (HCC) Active Problems:   Depression   Current Medications:  Current Facility-Administered Medications  Medication Dose Route Frequency Provider Last Rate Last Admin   acetaminophen (TYLENOL) tablet 650 mg  650 mg Oral Q6H PRN Jearld Lesch, NP       alum & mag hydroxide-simeth (MAALOX/MYLANTA) 200-200-20 MG/5ML suspension 30 mL  30 mL Oral Q4H PRN Durwin Nora, Rashaun M, NP       amLODipine (NORVASC) tablet 5 mg  5 mg Oral Daily Durwin Nora, Rashaun M, NP   5 mg at 05/06/23 1002   aspirin chewable tablet 81 mg  81 mg Oral Daily Dixon, Rashaun M, NP   81 mg at 05/06/23 1001   atorvastatin (LIPITOR) tablet 80 mg  80 mg Oral Daily Dixon, Rashaun M, NP   80 mg at 05/06/23 1002   carvedilol (COREG) tablet 12.5 mg  12.5 mg Oral BID WC Dixon, Rashaun M, NP   12.5 mg at 05/06/23 1001   diphenhydrAMINE (BENADRYL) capsule 50 mg  50 mg Oral TID PRN Jearld Lesch, NP       Or   diphenhydrAMINE (BENADRYL) injection 50 mg  50 mg Intramuscular TID PRN Jearld Lesch, NP       haloperidol (HALDOL) tablet 5 mg  5 mg Oral TID PRN Jearld Lesch, NP       Or   haloperidol lactate (HALDOL) injection 5 mg  5 mg Intramuscular TID PRN Jearld Lesch, NP       hydrOXYzine (ATARAX) tablet 25 mg  25 mg Oral TID PRN Jearld Lesch, NP       LORazepam (ATIVAN) tablet 2 mg  2 mg Oral TID PRN Jearld Lesch, NP       Or   LORazepam (ATIVAN) injection 2 mg  2 mg Intramuscular TID PRN Jearld Lesch, NP       losartan (COZAAR) tablet 50 mg  50 mg Oral Daily Dixon, Rashaun M, NP   50 mg at 05/06/23 1002   magnesium hydroxide (MILK OF MAGNESIA) suspension 30 mL  30 mL Oral Daily PRN Jearld Lesch, NP       sertraline (ZOLOFT) tablet 25  mg  25 mg Oral Daily Lewanda Rife, MD   25 mg at 05/06/23 1002   ticagrelor (BRILINTA) tablet 90 mg  90 mg Oral BID Lerry Liner M, NP   90 mg at 05/06/23 1001   traZODone (DESYREL) tablet 50 mg  50 mg Oral QHS PRN Jearld Lesch, NP       PTA Medications: Medications Prior to Admission  Medication Sig Dispense Refill Last Dose   amLODipine (NORVASC) 5 MG tablet Take 1 tablet (5 mg total) by mouth at bedtime. 90 tablet 1    aspirin 81 MG chewable tablet Chew 1 tablet (81 mg total) by mouth daily. 90 tablet 3    atorvastatin (LIPITOR) 80 MG tablet Take 1 tablet (80 mg total) by mouth daily. 90 tablet 3    carvedilol (COREG) 12.5 MG tablet Take 1 tablet (12.5 mg total) by mouth 2 (two) times daily with a meal. 180 tablet 3    losartan (COZAAR) 50 MG tablet Take 1 tablet (50 mg total) by mouth daily. 90 tablet 0    nitroGLYCERIN (  NITROSTAT) 0.4 MG SL tablet Place 1 tablet (0.4 mg total) under the tongue every 5 (five) minutes as needed for chest pain. 100 tablet 3    ticagrelor (BRILINTA) 90 MG TABS tablet Take 1 tablet (90 mg total) by mouth 2 (two) times daily. 60 tablet 11    traZODone (DESYREL) 50 MG tablet Take 25 mg by mouth at bedtime as needed for sleep.       Patient Stressors: Health problems   Other: recent MI and onset of "forgetfulness"    Patient Strengths: Average or above average intelligence  Financial means  Motivation for treatment/growth  Supportive family/friends   Treatment Modalities: Medication Management, Group therapy, Case management,  1 to 1 session with clinician, Psychoeducation, Recreational therapy.   Physician Treatment Plan for Primary Diagnosis: MDD (major depressive disorder), severe (HCC) Long Term Goal(s): Improvement in symptoms so as ready for discharge   Short Term Goals: Ability to identify changes in lifestyle to reduce recurrence of condition will improve Ability to verbalize feelings will improve Ability to disclose and discuss  suicidal ideas Ability to demonstrate self-control will improve Ability to identify and develop effective coping behaviors will improve Ability to maintain clinical measurements within normal limits will improve Compliance with prescribed medications will improve Ability to identify triggers associated with substance abuse/mental health issues will improve  Medication Management: Evaluate patient's response, side effects, and tolerance of medication regimen.  Therapeutic Interventions: 1 to 1 sessions, Unit Group sessions and Medication administration.  Evaluation of Outcomes: Not Progressing  Physician Treatment Plan for Secondary Diagnosis: Principal Problem:   MDD (major depressive disorder), severe (HCC) Active Problems:   Depression  Long Term Goal(s): Improvement in symptoms so as ready for discharge   Short Term Goals: Ability to identify changes in lifestyle to reduce recurrence of condition will improve Ability to verbalize feelings will improve Ability to disclose and discuss suicidal ideas Ability to demonstrate self-control will improve Ability to identify and develop effective coping behaviors will improve Ability to maintain clinical measurements within normal limits will improve Compliance with prescribed medications will improve Ability to identify triggers associated with substance abuse/mental health issues will improve     Medication Management: Evaluate patient's response, side effects, and tolerance of medication regimen.  Therapeutic Interventions: 1 to 1 sessions, Unit Group sessions and Medication administration.  Evaluation of Outcomes: Not Progressing   RN Treatment Plan for Primary Diagnosis: MDD (major depressive disorder), severe (HCC) Long Term Goal(s): Knowledge of disease and therapeutic regimen to maintain health will improve  Short Term Goals: Ability to remain free from injury will improve, Ability to verbalize frustration and anger  appropriately will improve, Ability to demonstrate self-control, Ability to participate in decision making will improve, Ability to verbalize feelings will improve, Ability to disclose and discuss suicidal ideas, Ability to identify and develop effective coping behaviors will improve, and Compliance with prescribed medications will improve  Medication Management: RN will administer medications as ordered by provider, will assess and evaluate patient's response and provide education to patient for prescribed medication. RN will report any adverse and/or side effects to prescribing provider.  Therapeutic Interventions: 1 on 1 counseling sessions, Psychoeducation, Medication administration, Evaluate responses to treatment, Monitor vital signs and CBGs as ordered, Perform/monitor CIWA, COWS, AIMS and Fall Risk screenings as ordered, Perform wound care treatments as ordered.  Evaluation of Outcomes: Not Progressing   LCSW Treatment Plan for Primary Diagnosis: MDD (major depressive disorder), severe (HCC) Long Term Goal(s): Safe transition to appropriate next level  of care at discharge, Engage patient in therapeutic group addressing interpersonal concerns.  Short Term Goals: Engage patient in aftercare planning with referrals and resources, Increase social support, Increase ability to appropriately verbalize feelings, Increase emotional regulation, Facilitate acceptance of mental health diagnosis and concerns, Facilitate patient progression through stages of change regarding substance use diagnoses and concerns, Identify triggers associated with mental health/substance abuse issues, and Increase skills for wellness and recovery  Therapeutic Interventions: Assess for all discharge needs, 1 to 1 time with Social worker, Explore available resources and support systems, Assess for adequacy in community support network, Educate family and significant other(s) on suicide prevention, Complete Psychosocial  Assessment, Interpersonal group therapy.  Evaluation of Outcomes: Not Progressing   Progress in Treatment: Attending groups: Yes. Participating in groups: Yes. Taking medication as prescribed: Yes. Toleration medication: Yes. Family/Significant other contact made: No, will contact:  CSW will contact if given permission  Patient understands diagnosis: Yes. Discussing patient identified problems/goals with staff: Yes. Medical problems stabilized or resolved: Yes. Denies suicidal/homicidal ideation: Yes. Issues/concerns per patient self-inventory: No. Other: None  New problem(s) identified: No, Describe:  None identified   New Short Term/Long Term Goal(s):  elimination of symptoms of psychosis, medication management for mood stabilization; elimination of SI thoughts; development of comprehensive mental wellness plan.   Patient Goals:  "I don't want to be here"   Discharge Plan or Barriers: CSW will assist with appropriate discharge planning   Reason for Continuation of Hospitalization: Depression Medication stabilization Suicidal ideation  Estimated Length of Stay: 1 to 7 days   Last 3 [place] Suicide Severity Risk Score: Flowsheet Row Admission (Current) from 05/04/2023 in [facility] ED from 05/03/2023 in [facility] ED to Hosp-Admission (Discharged) from 02/21/2023 in [facility]U  C-SSRS RISK CATEGORY No Risk No Risk No Risk       Last PHQ 2/9 Scores:    04/04/2023    3:55 PM 03/26/2023    1:59 PM 11/11/2015   10:11 AM  Depression screen PHQ 2/9  Decreased Interest 0 1 0  Down, Depressed, Hopeless 0 0 0  PHQ - 2 Score 0 1 0  Altered sleeping 1 1   Tired, decreased energy 1 1   Change in appetite 0 0   Feeling bad or failure about yourself  0 0   Trouble concentrating 1 1   Moving slowly or fidgety/restless 0 1   Suicidal thoughts 0 0   PHQ-9 Score 3 5   Difficult doing  work/chores Not difficult at all Not difficult at all     Scribe for Treatment Team: [name], [facility] 05/06/2023 10:42 AM

## 2023-05-06 NOTE — Group Note (Signed)
Date:  05/06/2023 Time:  10:44 PM  Group Topic/Focus:  Wrap-Up Group:   The focus of this group is to help patients review their daily goal of treatment and discuss progress on daily workbooks.    Participation Level:  Minimal  Participation Quality:  Attentive and Resistant  Affect:  Appropriate, Flat, and Tearful  Cognitive:  Alert  Insight: Good  Engagement in Group:  Limited and Poor  Modes of Intervention:  Discussion and Support  Additional Comments:     Maglione,Kathelene Rumberger E 05/06/2023, 10:44 PM

## 2023-05-06 NOTE — Progress Notes (Signed)
D: Pt alert and oriented. Pt denies experiencing any anxiety/depression at time of assessment, however, appears sad. Pt denies experiencing any pain at this time. Pt denies experiencing any SI/HI, or AVH at this time. Pt. Reported that visit with daughter this shift was welcoming. Pt has spent more than half of the shift laying in bed resting w/o any apparent distress noted.     A: Scheduled medications administered to pt, per MD orders. Support and encouragement provided. Routine safety checks conducted q15 minutes.   R: No adverse drug reactions noted. Pt verbally contracts for safety at this time. Pt complaint with medications. Pt interacts minimally with others on the unit. Pt remains safe at this time.

## 2023-05-07 ENCOUNTER — Ambulatory Visit: Payer: Medicare Other | Admitting: Cardiology

## 2023-05-07 DIAGNOSIS — F322 Major depressive disorder, single episode, severe without psychotic features: Secondary | ICD-10-CM | POA: Diagnosis not present

## 2023-05-07 MED ORDER — SERTRALINE HCL 50 MG PO TABS
50.0000 mg | ORAL_TABLET | Freq: Every day | ORAL | Status: DC
  Administered 2023-05-08 – 2023-05-09 (×2): 50 mg via ORAL
  Filled 2023-05-07 (×2): qty 1

## 2023-05-07 NOTE — Group Note (Signed)
Recreation Therapy Group Note   Group Topic:Problem Solving  Group Date: 05/07/2023 Start Time: 1400 End Time: 1450 Facilitators: Rosina Lowenstein, LRT, CTRS Location:  Day Room  Group Description: Life Boat. Patients were given the scenario that they are on a boat that is about to become shipwrecked, leaving them stranded on an [place]. They are asked to make a list of 15 different items that they want to take with them when they are stranded on the [place]. Patients are asked to rank their items from most important to least important, #1 being the most important and #15 being the least. Patients will work individually for the first round to come up with 15 items and then pair up with a peer(s) to condense their list and come up with one list of 15 items between the two of them. Patients or LRT will read aloud the 15 different items to the group after each round. LRT facilitated post-activity processing to discuss how this activity can be used in daily life post discharge.   Goal Area(s) Addressed:  Patient will identify priorities, wants and needs. Patient will communicate with LRT and peers. Patient will work collectively as a Administrator, Civil Service. Patient will work on Product manager.    Affect/Mood: Appropriate   Participation Level: Moderate   Participation Quality: Independent   Behavior: Calm and Cooperative   Speech/Thought Process: Coherent   Insight: Fair   Judgement: Fair    Modes of Intervention: Activity   Patient Response to Interventions:  Receptive   Education Outcome:  Acknowledges education   Clinical Observations/Individualized Feedback: Jaycelyn was active in their participation of session activities and group discussion. Pt identified "food, shelter, cloth, light, matches, ketchup and honey" as items she will bring with her on the [place]. Pt interacted well with LRT and peers duration of session.   Plan: Continue to engage patient in RT group sessions  2-3x/week.   Rosina Lowenstein, LRT, CTRS 05/07/2023 3:02 PM

## 2023-05-07 NOTE — Progress Notes (Signed)
Patient present in the dayroom for breakfast.  Sad affect, but brightens with interaction.  Denies SI/HI and AVH.  Denies anxiety and depression.  Reports a better appetite this morning.  Slept well.   Compliant with scheduled medications.  15 min checks in place for safety.  Patient present in the milieu throughout the day. Appropriate interaction with peers and staff.  Engaged in groups and coloring activities.   Smiling, bright affect this afternoon.

## 2023-05-07 NOTE — Group Note (Signed)
Date:  05/07/2023 Time:  6:33 PM  Group Topic/Focus:  Healthy Communication:   The focus of this group is to discuss communication, barriers to communication, as well as healthy ways to communicate with others.    Participation Level:  Active  Participation Quality:  Appropriate  Affect:  Appropriate  Cognitive:  Alert  Insight: Appropriate  Engagement in Group:  Developing/Improving  Modes of Intervention:  Activity  Additional Comments:    Ovie Cornelio 05/07/2023, 6:33 PM

## 2023-05-07 NOTE — [facility] Counselor (Signed)
CSW contacted Karina Freeman, [phone] to complete SPE.   [name] reports she had a heart attack in August that caused her to have anxiety and depression.    He states, "I want her home"   Reports they have guns and he will put them up before she comes home. He reports that pt does not know how to use the guns.   [name] reports he plans on visiting pt today.   Reynaldo Minium, MSW, [facility] 05/07/2023 10:44 AM

## 2023-05-07 NOTE — Group Note (Signed)
[facility] LCSW Group Therapy Note   Group Date: 05/07/2023 Start Time: 1315 End Time: 1400   Type of Therapy/Topic:  Group Therapy:  Emotion Regulation  Participation Level:  Minimal   Mood:  Description of Group:    The purpose of this group is to assist patients in learning to regulate negative emotions and experience positive emotions. Patients will be guided to discuss ways in which they have been vulnerable to their negative emotions. These vulnerabilities will be juxtaposed with experiences of positive emotions or situations, and patients challenged to use positive emotions to combat negative ones. Special emphasis will be placed on coping with negative emotions in conflict situations, and patients will process healthy conflict resolution skills.  Therapeutic Goals: Patient will identify two positive emotions or experiences to reflect on in order to balance out negative emotions:  Patient will label two or more emotions that they find the most difficult to experience:  Patient will be able to demonstrate positive conflict resolution skills through discussion or role plays:   Summary of Patient Progress:   Pt participated throughout group and was appropriate throughout.      Therapeutic Modalities:   Cognitive Behavioral Therapy Feelings Identification Dialectical Behavioral Therapy   [name], LCSWA

## 2023-05-07 NOTE — [facility] Suicide Risk Assessment (Signed)
[facility] INPATIENT:  Family/Significant Other Suicide Prevention Education  Suicide Prevention Education:  Education Completed; Jakylee Linberg [phone],  husband has been identified by the patient as the family member/significant other with whom the patient will be residing, and identified as the person(s) who will aid the patient in the event of a mental health crisis (suicidal ideations/suicide attempt).  With written consent from the patient, the family member/significant other has been provided the following suicide prevention education, prior to the and/or following the discharge of the patient.  The suicide prevention education provided includes the following: Suicide risk factors Suicide prevention and interventions National Suicide Hotline telephone number [facility] assessment telephone number [facility] Emergency Assistance 911 [facility] and/or Residential Mobile Crisis Unit telephone number  Request made of family/significant other to: Remove weapons (e.g., guns, rifles, knives), all items previously/currently identified as safety concern.   Remove drugs/medications (over-the-counter, prescriptions, illicit drugs), all items previously/currently identified as a safety concern.  The family member/significant other verbalizes understanding of the suicide prevention education information provided.  The family member/significant other agrees to remove the items of safety concern listed above.  [name] 05/07/2023, 10:45 AM

## 2023-05-07 NOTE — Progress Notes (Signed)
   05/07/23 1300  Spiritual Encounters  Type of Visit Initial  Care provided to: Patient  Referral source Chaplain assessment  Reason for visit Routine spiritual support  OnCall Visit No  Spiritual Framework  Presenting Themes Courage hope and growth;Impactful experiences and emotions  Community/Connection Family;Significant other  Patient Stress Factors Exhausted  Family Stress Factors None identified  Interventions  Spiritual Care Interventions Made Compassionate presence;Established relationship of care and support;Reflective listening;Encouragement  Intervention Outcomes  Outcomes Reduced isolation;Awareness around self/spiritual resourses  Spiritual Care Plan  Spiritual Care Issues Still Outstanding No further spiritual care needs at this time (see row info)   Chaplain met with patient and the dayroom to check on her progress. Patient stated that she was feeling much better since she has been in the hospital. Patient shared that her husband and daughter brought her to the hospital because they were worried about her being around weapons in the house because they felt she was suicidal. Patient shared that she is not feeling that way now and that she is looking forward to going home.

## 2023-05-07 NOTE — [facility] Counselor (Signed)
CSW contacted Karina Freeman, pt's daughter per request of of pt's husband to talk about discharge planning.   Pt's daughter states, "I think she's okay, I think the medication is helping a little bit with her anxiety. I think she's okay to come home"   CSW discussed the process of discharge with pt's daughter and informed pt's daughter that CSW will call pt's husband once discharge is confirmed.   Reynaldo Minium, MSW, [place] 05/07/2023 11:16 AM

## 2023-05-07 NOTE — Plan of Care (Signed)
  Problem: Nutrition: Goal: Adequate nutrition will be maintained Outcome: Progressing   Problem: Pain Managment: Goal: General experience of comfort will improve Outcome: Progressing   Problem: Skin Integrity: Goal: Risk for impaired skin integrity will decrease Outcome: Progressing   

## 2023-05-07 NOTE — Progress Notes (Signed)
[facility] MD Progress Note  05/07/2023 1:17 PM Karina Freeman  MRN:  [id number] Subjective: Karina Freeman is seen on rounds.  She came in with a history of depression and was placed on 25 mg of Zoloft.  She still very flat and depressed and isolative to her room.  Apparently, there was a gun involved which has been removed from the house.  This is the first meeting with her.  Talked to her about going up on her Zoloft.  She denies any suicidal or homicidal ideation.  Nurses report no issues. Principal Problem: MDD (major depressive disorder), severe (HCC) Diagnosis: Principal Problem:   MDD (major depressive disorder), severe (HCC) Active Problems:   Depression  Total Time spent with patient: 15 minutes  Past Psychiatric History: Unremarkable  Past Medical History:  Past Medical History:  Diagnosis Date   Abnormal Pap smear of cervix 11/11/2015   Acute HFrEF (heart failure with reduced ejection fraction) (HCC) 02/22/2023   Acute ST elevation myocardial infarction (STEMI) of anterior wall (HCC) 02/15/2023   Anxiety    CVA (cerebral infarction)    Depressive disorder    GERD (gastroesophageal reflux disease)    Hyperlipidemia    Hypertension    IFG (impaired fasting glucose)    Osteoporosis    ST elevation myocardial infarction (STEMI) (HCC) 02/17/2023    Past Surgical History:  Procedure Laterality Date   APPENDECTOMY     CORONARY IMAGING/OCT N/A 02/21/2023   Procedure: CORONARY IMAGING/OCT;  Surgeon: Yvonne Kendall, MD;  Location: [facility] IN[facility] CV [facility];  Service: Cardiovascular;  Laterality: N/A;   CORONARY PRESSURE/FFR STUDY N/A 02/21/2023   Procedure: CORONARY PRESSURE/FFR STUDY;  Surgeon: Yvonne Kendall, MD;  Location: [facility];  Service: Cardiovascular;  Laterality: N/A;   CORONARY STENT INTERVENTION N/A 02/21/2023   Procedure: CORONARY STENT INTERVENTION;  Surgeon: Yvonne Kendall, MD;  Location: [facility];  Service: Cardiovascular;  Laterality: N/A;    CORONARY/GRAFT ACUTE MI REVASCULARIZATION N/A 02/15/2023   Procedure: Coronary/Graft Acute MI Revascularization;  Surgeon: Iran Ouch, MD;  Location: [facility] CV [facility];  Service: Cardiovascular;  Laterality: N/A;   LEFT HEART CATH AND CORONARY ANGIOGRAPHY N/A 02/15/2023   Procedure: LEFT HEART CATH AND CORONARY ANGIOGRAPHY;  Surgeon: Iran Ouch, MD;  Location: [facility];  Service: Cardiovascular;  Laterality: N/A;   LEFT HEART CATH AND CORONARY ANGIOGRAPHY N/A 02/21/2023   Procedure: LEFT HEART CATH AND CORONARY ANGIOGRAPHY;  Surgeon: Yvonne Kendall, MD;  Location: [facility];  Service: Cardiovascular;  Laterality: N/A;   TONSILLECTOMY     TUBAL LIGATION     Family History:  Family History  Problem Relation Age of Onset   Heart disease Mother    Cancer Mother        breast   Heart disease Father    Heart disease Brother    Bipolar disorder Brother    Heart disease Sister    Breast cancer Neg Hx    Family Psychiatric  History: Unremarkable Social History:  Social History   Substance and Sexual Activity  Alcohol Use No   Alcohol/week: 0.0 standard drinks of alcohol     Social History   Substance and Sexual Activity  Drug Use No    Social History   Socioeconomic History   Marital status: Married    Spouse name: Karina Freeman   Number of children: 3   Years of education: Not on file   Highest education level: Not on file  Occupational History  Not on file  Tobacco Use   Smoking status: Former    Current packs/day: 0.00    Types: Cigarettes    Quit date: 07/16/1973    Years since quitting: 49.8   Smokeless tobacco: Never  Vaping Use   Vaping status: Never Used  Substance and Sexual Activity   Alcohol use: No    Alcohol/week: 0.0 standard drinks of alcohol   Drug use: No   Sexual activity: Yes  Other Topics Concern   Not on file  Social History Narrative   Not on file   Social Determinants of Health   Financial Resource Strain: Not on  file  Food Insecurity: No Food Insecurity (05/04/2023)   Hunger Vital Sign    Worried About Running Out of Food in the Last Year: Never true    Ran Out of Food in the Last Year: Never true  Transportation Needs: No Transportation Needs (05/04/2023)   PRAPARE - Administrator, Civil Service (Medical): No    Lack of Transportation (Non-Medical): No  Physical Activity: Not on file  Stress: Not on file  Social Connections: Not on file   Additional Social History:                         Sleep: Good  Appetite:  Good  Current Medications: Current Facility-Administered Medications  Medication Dose Route Frequency Provider Last Rate Last Admin   acetaminophen (TYLENOL) tablet 650 mg  650 mg Oral Q6H PRN Jearld Lesch, NP       alum & mag hydroxide-simeth (MAALOX/MYLANTA) 200-200-20 MG/5ML suspension 30 mL  30 mL Oral Q4H PRN Durwin Nora, Rashaun M, NP       amLODipine (NORVASC) tablet 5 mg  5 mg Oral Daily Durwin Nora, Rashaun M, NP   5 mg at 05/07/23 0454   aspirin chewable tablet 81 mg  81 mg Oral Daily Lerry Liner M, NP   81 mg at 05/07/23 0838   atorvastatin (LIPITOR) tablet 80 mg  80 mg Oral Daily Lerry Liner M, NP   80 mg at 05/07/23 0981   carvedilol (COREG) tablet 12.5 mg  12.5 mg Oral BID WC Dixon, Rashaun M, NP   12.5 mg at 05/07/23 1914   diphenhydrAMINE (BENADRYL) capsule 50 mg  50 mg Oral TID PRN Jearld Lesch, NP       Or   diphenhydrAMINE (BENADRYL) injection 50 mg  50 mg Intramuscular TID PRN Jearld Lesch, NP       haloperidol (HALDOL) tablet 5 mg  5 mg Oral TID PRN Jearld Lesch, NP       Or   haloperidol lactate (HALDOL) injection 5 mg  5 mg Intramuscular TID PRN Jearld Lesch, NP       hydrOXYzine (ATARAX) tablet 25 mg  25 mg Oral TID PRN Jearld Lesch, NP       LORazepam (ATIVAN) tablet 2 mg  2 mg Oral TID PRN Jearld Lesch, NP       Or   LORazepam (ATIVAN) injection 2 mg  2 mg Intramuscular TID PRN Jearld Lesch, NP        losartan (COZAAR) tablet 50 mg  50 mg Oral Daily Dixon, Rashaun M, NP   50 mg at 05/07/23 7829   magnesium hydroxide (MILK OF MAGNESIA) suspension 30 mL  30 mL Oral Daily PRN Jearld Lesch, NP       [START ON 05/08/2023] sertraline (ZOLOFT) tablet  50 mg  50 mg Oral Daily Sarina Ill, DO       ticagrelor [facility]) tablet 90 mg  90 mg Oral BID Lerry Liner M, NP   90 mg at 05/07/23 1324   traZODone (DESYREL) tablet 50 mg  50 mg Oral QHS PRN Jearld Lesch, NP        Lab Results: No results found for this or any previous visit (from the past 48 hour(s)).  Blood Alcohol level:  Lab Results  Component Value Date   ETH <10 05/03/2023    Metabolic Disorder Labs: Lab Results  Component Value Date   HGBA1C 5.4 02/15/2023   MPG 108.28 02/15/2023   No results found for: "PROLACTIN" Lab Results  Component Value Date   CHOL 125 03/14/2023   TRIG 86 03/14/2023   HDL 52 03/14/2023   CHOLHDL 2.4 03/14/2023   VLDL 17 02/15/2023   LDLCALC 56 03/14/2023   LDLCALC 164 (H) 02/15/2023    Physical Findings: AIMS:  , ,  ,  ,    CIWA:    COWS:     Musculoskeletal: Strength & Muscle Tone: within normal limits Gait & Station: normal Patient leans: N/A  Psychiatric Specialty Exam:  Presentation  General Appearance:  Appropriate for Environment  Eye Contact: Fair  Speech: Slow; Other (comment) (Scanned)  Speech Volume: Decreased  Handedness: Right   Mood and Affect  Mood: -- (Fine)  Affect: Constricted   Thought Process  Thought Processes: Disorganized (Slowed, blocked)  Descriptions of Associations:Tangential  Orientation:Full (Time, Place and Person)  Thought Content:Scattered  History of Schizophrenia/Schizoaffective disorder:No  Duration of Psychotic Symptoms:No data recorded Hallucinations:No data recorded Ideas of Reference:None  Suicidal Thoughts:No data recorded Homicidal Thoughts:No data recorded  Sensorium  Memory: Other  (comment) (Impaired)  Judgment: Impaired  Insight: Fair   Art therapist  Concentration: Poor  Attention Span: Poor  Recall: Poor  Fund of Knowledge: Fair  Language: Fair   Psychomotor Activity  Psychomotor Activity:No data recorded  Assets  Assets: Desire for Improvement; Social Support; Manufacturing systems engineer; Housing   Sleep  Sleep:No data recorded    Blood pressure (!) 135/91, pulse 74, temperature 98.2 F (36.8 C), resp. rate 18, height 5\' 7"  (1.702 m), weight 69.2 kg, SpO2 95%. Body mass index is 23.88 kg/m.   Treatment Plan Summary: Daily contact with patient to assess and evaluate symptoms and progress in treatment, Medication management, and Plan increase Zoloft to 50 mg/day.  Sarina Ill, DO 05/07/2023, 1:17 PM

## 2023-05-08 DIAGNOSIS — F322 Major depressive disorder, single episode, severe without psychotic features: Secondary | ICD-10-CM | POA: Diagnosis not present

## 2023-05-08 NOTE — Progress Notes (Signed)
D: Pt alert and oriented. Pt denies experiencing any anxiety/depression at time of assessment. Pt denies experiencing any pain at this time. Pt denies experiencing any SI/HI, or AVH at this time. Pt visited with husband this shift, appeared to be in good spirit this shift. Spent  at least 40% of shift in dayroom interacting w/ peers.      A: Scheduled medications administered to pt, per MD orders. Prn Trazodone 50 mg offered for insomnia. Support and encouragement provided. Frequent verbal contact made. Routine safety checks conducted q15 minutes.   R: No adverse drug reactions noted. Pt verbally contracts for safety at this time. Pt complaint with medications. Pt interacted appropriately with others on the unit. Pt remains safe at this time, will continue with plan of care.

## 2023-05-08 NOTE — Progress Notes (Signed)
   05/08/23 1221  Psych Admission Type (Psych Patients Only)  Admission Status Involuntary  Psychosocial Assessment  Patient Complaints Worrying;Sadness  Eye Contact Fair  Facial Expression Flat  Affect Flat  Speech Logical/coherent  Interaction Minimal  Motor Activity Slow  Appearance/Hygiene Unremarkable  Behavior Characteristics Cooperative  Mood Pleasant  Thought Process  Coherency WDL  Content WDL  Delusions None reported or observed  Perception WDL  Hallucination None reported or observed  Judgment Impaired  Confusion None  Danger to Self  Current suicidal ideation? Denies  Danger to Others  Danger to Others None reported or observed

## 2023-05-08 NOTE — Group Note (Signed)
Date:  05/08/2023 Time:  10:03 PM  Group Topic/Focus:  Identifying Needs:   The focus of this group is to help patients identify their personal needs that have been historically problematic and identify healthy behaviors to address their needs.    Participation Level:  Active  Participation Quality:  Appropriate  Affect:  Appropriate  Cognitive:  Appropriate  Insight: Appropriate  Engagement in Group:  Engaged  Modes of Intervention:  Education  Additional Comments:    Garry Heater 05/08/2023, 10:03 PM

## 2023-05-08 NOTE — Plan of Care (Signed)
  Problem: Education: Goal: Knowledge of General Education information will improve Description: Including pain rating scale, medication(s)/side effects and non-pharmacologic comfort measures Outcome: Progressing   Problem: Health Behavior/Discharge Planning: Goal: Ability to manage health-related needs will improve Outcome: Progressing  Scheduled medications administered per Provider order. Support and encouragement provided. Routine safety checks conducted every 15 minutes. Patient notified to inform staff with problems or concerns.  No adverse drug reactions noted. Patient contracts for safety at this time.

## 2023-05-08 NOTE — Group Note (Signed)
Recreation Therapy Group Note   Group Topic:General Recreation  Group Date: 05/08/2023 Start Time: 1400 End Time: 1455 Facilitators: Rosina Lowenstein, LRT, CTRS Location: Courtyard  Group Description: Outdoor Recreation. Patients had the option to play corn hole, ring toss, playing cards or listening to music while outside in the courtyard getting fresh air and sunlight. Marland Kitchen LRT and patients discussed things that they enjoy doing in their free time outside of the hospital. LRT encouraged patients to drink water after being active and getting their heart rate up.   Goal Area(s) Addressed: Patient will identify leisure interests.  Patient will practice healthy decision making. Patient will engage in recreation activity.   Affect/Mood: Appropriate   Participation Level: Active and Engaged   Participation Quality: Independent   Behavior: Calm and Cooperative   Speech/Thought Process: Coherent   Insight: Fair   Judgement: Good   Modes of Intervention: Activity   Patient Response to Interventions:  Attentive, Engaged, and Receptive   Education Outcome:  Acknowledges education   Clinical Observations/Individualized Feedback: Karina Freeman was active in their participation of session activities and group discussion. Pt chose to play cards with peers while outside in group. Pt interacted well with LRT and peers duration of session.   Plan: Continue to engage patient in RT group sessions 2-3x/week.   Rosina Lowenstein, LRT, CTRS 05/08/2023 3:34 PM

## 2023-05-08 NOTE — Progress Notes (Signed)
[facility] MD Progress Note  05/08/2023 12:01 PM Karina Freeman  MRN:  [id number] Subjective: Karina Freeman is seen on rounds.  She is interacting better with peers.  I told her that I went up on her Zoloft and she said that that was okay.  She still depressed but denies any suicidal ideation.  She said that she would be ready to go home tomorrow.  She says she slept well and her appetite is good.  Social work is going to talk with her husband. Principal Problem: MDD (major depressive disorder), severe (HCC) Diagnosis: Principal Problem:   MDD (major depressive disorder), severe (HCC) Active Problems:   Depression  Total Time spent with patient: 15 minutes  Past Psychiatric History: Depression  Past Medical History:  Past Medical History:  Diagnosis Date   Abnormal Pap smear of cervix 11/11/2015   Acute HFrEF (heart failure with reduced ejection fraction) (HCC) 02/22/2023   Acute ST elevation myocardial infarction (STEMI) of anterior wall (HCC) 02/15/2023   Anxiety    CVA (cerebral infarction)    Depressive disorder    GERD (gastroesophageal reflux disease)    Hyperlipidemia    Hypertension    IFG (impaired fasting glucose)    Osteoporosis    ST elevation myocardial infarction (STEMI) (HCC) 02/17/2023    Past Surgical History:  Procedure Laterality Date   APPENDECTOMY     CORONARY IMAGING/OCT N/A 02/21/2023   Procedure: CORONARY IMAGING/OCT;  Surgeon: Yvonne Kendall, MD;  Location: [facility];  Service: Cardiovascular;  Laterality: N/A;   CORONARY PRESSURE/FFR STUDY N/A 02/21/2023   Procedure: CORONARY PRESSURE/FFR STUDY;  Surgeon: Yvonne Kendall, MD;  Location: [facility];  Service: Cardiovascular;  Laterality: N/A;   CORONARY STENT INTERVENTION N/A 02/21/2023   Procedure: CORONARY STENT INTERVENTION;  Surgeon: Yvonne Kendall, MD;  Location: [facility];  Service: Cardiovascular;  Laterality: N/A;   CORONARY/GRAFT ACUTE MI REVASCULARIZATION N/A 02/15/2023    Procedure: Coronary/Graft Acute MI Revascularization;  Surgeon: Iran Ouch, MD;  Location: [facility];  Service: Cardiovascular;  Laterality: N/A;   LEFT HEART CATH AND CORONARY ANGIOGRAPHY N/A 02/15/2023   Procedure: LEFT HEART CATH AND CORONARY ANGIOGRAPHY;  Surgeon: Iran Ouch, MD;  Location: [facility];  Service: Cardiovascular;  Laterality: N/A;   LEFT HEART CATH AND CORONARY ANGIOGRAPHY N/A 02/21/2023   Procedure: LEFT HEART CATH AND CORONARY ANGIOGRAPHY;  Surgeon: Yvonne Kendall, MD;  Location: [facility];  Service: Cardiovascular;  Laterality: N/A;   TONSILLECTOMY     TUBAL LIGATION     Family History:  Family History  Problem Relation Age of Onset   Heart disease Mother    Cancer Mother        breast   Heart disease Father    Heart disease Brother    Bipolar disorder Brother    Heart disease Sister    Breast cancer Neg Hx    Family Psychiatric  History: Unremarkable Social History:  Social History   Substance and Sexual Activity  Alcohol Use No   Alcohol/week: 0.0 standard drinks of alcohol     Social History   Substance and Sexual Activity  Drug Use No    Social History   Socioeconomic History   Marital status: Married    Spouse name: [name]   Number of children: 3   Years of education: Not on file   Highest education level: Not on file  Occupational History   Not on file  Tobacco  Use   Smoking status: Former    Current packs/day: 0.00    Types: Cigarettes    Quit date: 07/16/1973    Years since quitting: 49.8   Smokeless tobacco: Never  Vaping Use   Vaping status: Never Used  Substance and Sexual Activity   Alcohol use: No    Alcohol/week: 0.0 standard drinks of alcohol   Drug use: No   Sexual activity: Yes  Other Topics Concern   Not on file  Social History Narrative   Not on file   Social Determinants of Health   Financial Resource Strain: Not on file  Food Insecurity: No Food Insecurity (05/04/2023)    Hunger Vital Sign    Worried About Running Out of Food in the Last Year: Never true    Ran Out of Food in the Last Year: Never true  Transportation Needs: No Transportation Needs (05/04/2023)   PRAPARE - Administrator, Civil Service (Medical): No    Lack of Transportation (Non-Medical): No  Physical Activity: Not on file  Stress: Not on file  Social Connections: Not on file   Additional Social History:                         Sleep: Good  Appetite:  Good  Current Medications: Current Facility-Administered Medications  Medication Dose Route Frequency Provider Last Rate Last Admin   acetaminophen (TYLENOL) tablet 650 mg  650 mg Oral Q6H PRN Jearld Lesch, NP       alum & mag hydroxide-simeth (MAALOX/MYLANTA) 200-200-20 MG/5ML suspension 30 mL  30 mL Oral Q4H PRN Durwin Nora, Rashaun M, NP       amLODipine (NORVASC) tablet 5 mg  5 mg Oral Daily Durwin Nora, Rashaun M, NP   5 mg at 05/08/23 9528   aspirin chewable tablet 81 mg  81 mg Oral Daily Lerry Liner M, NP   81 mg at 05/08/23 4132   atorvastatin (LIPITOR) tablet 80 mg  80 mg Oral Daily Lerry Liner M, NP   80 mg at 05/08/23 4401   carvedilol (COREG) tablet 12.5 mg  12.5 mg Oral BID WC Dixon, Rashaun M, NP   12.5 mg at 05/08/23 0272   diphenhydrAMINE (BENADRYL) capsule 50 mg  50 mg Oral TID PRN Jearld Lesch, NP       Or   diphenhydrAMINE (BENADRYL) injection 50 mg  50 mg Intramuscular TID PRN Jearld Lesch, NP       haloperidol (HALDOL) tablet 5 mg  5 mg Oral TID PRN Jearld Lesch, NP       Or   haloperidol lactate (HALDOL) injection 5 mg  5 mg Intramuscular TID PRN Jearld Lesch, NP       hydrOXYzine (ATARAX) tablet 25 mg  25 mg Oral TID PRN Jearld Lesch, NP       LORazepam (ATIVAN) tablet 2 mg  2 mg Oral TID PRN Jearld Lesch, NP       Or   LORazepam (ATIVAN) injection 2 mg  2 mg Intramuscular TID PRN Jearld Lesch, NP       losartan (COZAAR) tablet 50 mg  50 mg Oral Daily Dixon,  Rashaun M, NP   50 mg at 05/08/23 5366   magnesium hydroxide (MILK OF MAGNESIA) suspension 30 mL  30 mL Oral Daily PRN Jearld Lesch, NP       sertraline (ZOLOFT) tablet 50 mg  50 mg Oral Daily Marlou Porch,  Lanny Cramp, DO   50 mg at 05/08/23 1610   ticagrelor (BRILINTA) tablet 90 mg  90 mg Oral BID Lerry Liner M, NP   90 mg at 05/08/23 9604   traZODone (DESYREL) tablet 50 mg  50 mg Oral QHS PRN Jearld Lesch, NP   50 mg at 05/07/23 2150    Lab Results: No results found for this or any previous visit (from the past 48 hour(s)).  Blood Alcohol level:  Lab Results  Component Value Date   ETH <10 05/03/2023    Metabolic Disorder Labs: Lab Results  Component Value Date   HGBA1C 5.4 02/15/2023   MPG 108.28 02/15/2023   No results found for: "PROLACTIN" Lab Results  Component Value Date   CHOL 125 03/14/2023   TRIG 86 03/14/2023   HDL 52 03/14/2023   CHOLHDL 2.4 03/14/2023   VLDL 17 02/15/2023   LDLCALC 56 03/14/2023   LDLCALC 164 (H) 02/15/2023    Physical Findings: AIMS:  , ,  ,  ,    CIWA:    COWS:     Musculoskeletal: Strength & Muscle Tone: within normal limits Gait & Station: normal Patient leans: N/A  Psychiatric Specialty Exam:  Presentation  General Appearance:  Appropriate for Environment  Eye Contact: Fair  Speech: Slow; Other (comment) (Scanned)  Speech Volume: Decreased  Handedness: Right   Mood and Affect  Mood: -- (Fine)  Affect: Constricted   Thought Process  Thought Processes: Disorganized (Slowed, blocked)  Descriptions of Associations:Tangential  Orientation:Full (Time, Place and Person)  Thought Content:Scattered  History of Schizophrenia/Schizoaffective disorder:No  Duration of Psychotic Symptoms:No data recorded Hallucinations:No data recorded Ideas of Reference:None  Suicidal Thoughts:No data recorded Homicidal Thoughts:No data recorded  Sensorium  Memory: Other (comment)  (Impaired)  Judgment: Impaired  Insight: Fair   Art therapist  Concentration: Poor  Attention Span: Poor  Recall: Poor  Fund of Knowledge: Fair  Language: Fair   Psychomotor Activity  Psychomotor Activity:No data recorded  Assets  Assets: Desire for Improvement; Social Support; Manufacturing systems engineer; Housing   Sleep  Sleep:No data recorded    Blood pressure (!) 161/73, pulse 66, temperature 97.8 F (36.6 C), resp. rate 16, height 5\' 7"  (1.702 m), weight 69.2 kg, SpO2 96%. Body mass index is 23.88 kg/m.   Treatment Plan Summary: Daily contact with patient to assess and evaluate symptoms and progress in treatment, Medication management, and Plan continue current medications.  Sarina Ill, DO 05/08/2023, 12:01 PM

## 2023-05-09 ENCOUNTER — Other Ambulatory Visit: Payer: Self-pay | Admitting: Internal Medicine

## 2023-05-09 DIAGNOSIS — F322 Major depressive disorder, single episode, severe without psychotic features: Secondary | ICD-10-CM

## 2023-05-09 MED ORDER — SERTRALINE HCL 50 MG PO TABS: 50.0000 mg | ORAL_TABLET | Freq: Every day | ORAL | 3 refills | Status: DC

## 2023-05-09 MED ORDER — TRAZODONE HCL 50 MG PO TABS: 50.0000 mg | ORAL_TABLET | Freq: Every evening | ORAL | 3 refills | Status: DC | PRN

## 2023-05-09 NOTE — Progress Notes (Signed)
  [facility] Adult Case Management Discharge Plan :  Will you be returning to the same living situation after discharge:  Yes,  pt will return home  At discharge, do you have transportation home?: Yes,  pt's husband will pick he up  Do you have the ability to pay for your medications: Yes,  [employer] MEDICARE / [facility] MEDICARE  Release of information consent forms completed and in the chart;  Patient's signature needed at discharge.  Patient to Follow up at:  Follow-up Information     [facility]. Go on 07/22/2023.   Why: Your appointment is for at 1:30 PM. You will be mailed a packet to fill out and return to them.        [facility]. Go to.   Why: Hours of Operation: Monday-Friday, 8 a.m. - 5 p.m. Office closed for lunch, 12 p.m. - 1 p.m. The last Open Access walk-in will be taken at 3 p.m. each day Contact information: [address]  [address] [place] [address] [phone]                 Next level of care provider has access to [facility] Link:yes  Safety Planning and Suicide Prevention discussed: Yes,  Rachale Angelucci      Has patient been referred to the Quitline?: Patient does not use tobacco/nicotine products  Patient has been referred for addiction treatment: No known substance use disorder.  [address] LCSWA 05/09/2023, 1:07 PM

## 2023-05-09 NOTE — Care Management Important Message (Signed)
Important Message  Patient Details  Name: Karina Freeman MRN: [id number] Date of Birth: 1951/01/03   Important Message Given:  Yes - Medicare IM     [name], LCSWA 05/09/2023, 11:34 AM
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
## 2023-05-09 NOTE — Progress Notes (Signed)
Patient ID: Karina Freeman, female   DOB: 21-Jun-1951, 72 y.o.   MRN: [id number]  Patient was discharged from [facility] unit at approx 1420 escorted by staff. Patient denies SI/HI/AVH. Discharge packet to include printed AVS, Suicide Risk Assessment, and Transition Record reviewed with patient. Belongings to include eyeglasses (on person) returned and patient verified receipt with signature. Suicide safety plan completed with a copy kept in chart.

## 2023-05-09 NOTE — Progress Notes (Deleted)
  [facility] Adult Case Management Discharge Plan :  Will you be returning to the same living situation after discharge:  Yes,  pt will return home  At discharge, do you have transportation home?: Yes,  pt's husband will pick her up  Do you have the ability to pay for your medications: Yes,  [employer] MEDICARE / [facility] MEDICARE  Release of information consent forms completed and in the chart;  Patient's signature needed at discharge.  Patient to Follow up at:  Follow-up Information     [facility]. Go to.   Why: [facility] is making appointment out until 2025. For this reason, I am making an appointment for [facility] which will be listed below. This will bridge you until you are able to go to [facility]        [facility]. Go to.   Why: Hours of Operation: Monday-Friday, 8 a.m. - 5 p.m. Office closed for lunch, 12 p.m. - 1 p.m. The last Open Access walk-in will be taken at 3 p.m. each day Contact information: [address]  [address] [place] [address] [phone]                 Next level of care provider has access to [facility] Link:no  Safety Planning and Suicide Prevention discussed: Yes,  Irene Pap, husband      Has patient been referred to the Quitline?: Patient does not use tobacco/nicotine products  Patient has been referred for addiction treatment: No known substance use disorder.  [address], LCSWA 05/09/2023, 11:32 AM

## 2023-05-09 NOTE — BHH Suicide Risk Assessment (Signed)
[facility] Discharge Suicide Risk Assessment   Principal Problem: MDD (major depressive disorder), severe (HCC) Discharge Diagnoses: Principal Problem:   MDD (major depressive disorder), severe (HCC) Active Problems:   Depression   Total Time spent with patient: 1 hour  Musculoskeletal: Strength & Muscle Tone: within normal limits Gait & Station: normal Patient leans: N/A  Psychiatric Specialty Exam  Presentation  General Appearance:  Appropriate for Environment  Eye Contact: Fair  Speech: Slow; Other (comment) (Scanned)  Speech Volume: Decreased  Handedness: Right   Mood and Affect  Mood: -- (Fine)  Duration of Depression Symptoms: Greater than two weeks  Affect: Constricted   Thought Process  Thought Processes: Disorganized (Slowed, blocked)  Descriptions of Associations:Tangential  Orientation:Full (Time, Place and Person)  Thought Content:Scattered  History of Schizophrenia/Schizoaffective disorder:No  Duration of Psychotic Symptoms:No data recorded Hallucinations:No data recorded Ideas of Reference:None  Suicidal Thoughts:No data recorded Homicidal Thoughts:No data recorded  Sensorium  Memory: Other (comment) (Impaired)  Judgment: Impaired  Insight: Fair   Art therapist  Concentration: Poor  Attention Span: Poor  Recall: Poor  Fund of Knowledge: Fair  Language: Fair   Psychomotor Activity  Psychomotor Activity:No data recorded  Assets  Assets: Desire for Improvement; Social Support; Manufacturing systems engineer; Housing   Sleep  Sleep:No data recorded  MENTAL STATUS EXAM: Patient is alert and oriented x 3, pleasant and cooperative, good eye contact, speech is normal and not pressured, mood is depressed; affect is mostly euthymic, thought process: goal directed; thought content: no suicidal ideation; judgment is good, insight is good.  Denies SI, HI, auditory or visual hallucinations. Blood pressure 112/66, pulse 69,  temperature (!) 97.5 F (36.4 C), resp. rate 18, height 5\' 7"  (1.702 m), weight 69.2 kg, SpO2 97%. Body mass index is 23.88 kg/m.  Mental Status Per Nursing Assessment::   On Admission:  NA  Demographic Factors:  Age 57 or older and Caucasian  Loss Factors: NA  Historical Factors: NA  Risk Reduction Factors:   Sense of responsibility to family, Living with another person, especially a relative, Positive social support, Positive therapeutic relationship, and Positive coping skills or problem solving skills  Continued Clinical Symptoms:  Depression:   Anhedonia  Cognitive Features That Contribute To Risk:  None    Suicide Risk:  Minimal: No identifiable suicidal ideation.  Patients presenting with no risk factors but with morbid ruminations; may be classified as minimal risk based on the severity of the depressive symptoms    Plan Of Care/Follow-up recommendations: [facility]   Sarina Ill, DO 05/09/2023, 10:20 AM

## 2023-05-10 ENCOUNTER — Telehealth: Payer: Self-pay | Admitting: Internal Medicine

## 2023-05-10 NOTE — Telephone Encounter (Signed)
Copied from CRM [id number]. Topic: Medicare AWV >> May 10, 2023  9:27 AM Payton Doughty wrote: Reason for CRM: Called 05/10/2023 to sched Annual Wellness Visit - NO VOICEMAIL  Verlee Rossetti; Care Guide Ambulatory Clinical Support [place] [facility] Direct Dial: [phone]

## 2023-05-13 ENCOUNTER — Ambulatory Visit: Payer: Self-pay | Admitting: *Deleted

## 2023-05-13 ENCOUNTER — Telehealth: Payer: Self-pay | Admitting: *Deleted

## 2023-05-13 NOTE — Telephone Encounter (Signed)
Husband, Jack(DPR) Emergency planning/management officer Complaint: Husband has concerns about sertraline and trazodone interactions Symptoms: patient has foot twitching last night- believed to be caused by increased dose of trazodone- 50 mg last night( patient normally takes 25mg )  Pertinent Negatives: Patient denies other SE Disposition: [] ED /[] Urgent Care (no appt availability in office) / [] Appointment(In office/virtual)/ []  [facility]/ [] Home Care/ [] Refused Recommended Disposition /[] [facility]/ [x]  Follow-up with PCP Additional Notes: Patient's husband is calling to make PCP aware that patient has had additional antidepressant added to her medication list- she is doing well and her depression has improved. He is concerned about SE- and reports patient did have foot twitching last night only- and sone urinary incontinence with increased dose of trazodone.  He states she will keep the dose at 25 mg until she is seen in office- please let him know if patient needs to change anything before next visit.

## 2023-05-13 NOTE — Transitions of Care (Post Inpatient/ED Visit) (Signed)
05/13/2023  Name: Karina Freeman MRN: [id number] DOB: 02-Nov-1950  Today's TOC FU Call Status: Today's TOC FU Call Status:: Successful TOC FU Call Completed TOC FU Call Complete Date: 05/13/23 Patient's Name and Date of Birth confirmed.  Transition Care Management Follow-up Telephone Call Date of Discharge: 05/09/23 Discharge Facility: [facility]) [facility] behavioral health) Type of Discharge: Inpatient Admission Primary Inpatient Discharge Diagnosis:: Severe major depression without psychotic features How have you been since you were released from the hospital?: Better  Items Reviewed: Did you receive and understand the discharge instructions provided?: Yes Medications obtained,verified, and reconciled?: Yes (Medications Reviewed) Any new allergies since your discharge?: No Dietary orders reviewed?: No Do you have support at home?: Yes People in Home: spouse Name of Support/Comfort Primary Source: [name]  Medications Reviewed Today: Medications Reviewed Today     Reviewed by Luella Cook, RN (Case Manager) on 05/13/23 at 1203  Med List Status: <None>   Medication Order Taking? Sig Documenting Provider Last Dose Status Informant  amLODipine (NORVASC) 5 MG tablet [id number] Yes Take 1 tablet (5 mg total) by mouth at bedtime. Reather Littler D, NP Taking Active   aspirin 81 MG chewable tablet [id number] Yes Chew 1 tablet (81 mg total) by mouth daily. Reather Littler D, NP Taking Active   atorvastatin (LIPITOR) 80 MG tablet [id number] Yes Take 1 tablet (80 mg total) by mouth daily. Reather Littler D, NP Taking Active   carvedilol (COREG) 12.5 MG tablet [id number]  Take 1 tablet (12.5 mg total) by mouth 2 (two) times daily with a meal. Reather Littler D, NP  Expired 05/04/23 2359   losartan (COZAAR) 50 MG tablet [id number] Yes Take 1 tablet (50 mg total) by mouth daily. Reather Littler D, NP Taking Active   nitroGLYCERIN (NITROSTAT) 0.4 MG SL tablet [id number] Yes Place 1 tablet  (0.4 mg total) under the tongue every 5 (five) minutes as needed for chest pain. Ernestene Mention, MD Taking Active   sertraline (ZOLOFT) 50 MG tablet [id number] Yes Take 1 tablet (50 mg total) by mouth daily. Sarina Ill, DO Taking Active   ticagrelor (BRILINTA) 90 MG TABS tablet [id number] Yes Take 1 tablet (90 mg total) by mouth 2 (two) times daily. Ernestene Mention, MD Taking Active   traZODone (DESYREL) 50 MG tablet [id number] Yes Take 1 tablet (50 mg total) by mouth at bedtime as needed for sleep. Sarina Ill, DO Taking Active             Home Care and Equipment/Supplies: Were Home Health Services Ordered?: NA Any new equipment or medical supplies ordered?: NA  Functional Questionnaire: Do you need assistance with bathing/showering or dressing?: No Do you need assistance with meal preparation?: Yes Do you need assistance with eating?: No Do you have difficulty maintaining continence: No Do you need assistance with getting out of bed/getting out of a chair/moving?: No Do you have difficulty managing or taking your medications?: No  Follow up appointments reviewed: PCP Follow-up appointment confirmed?: Yes Date of PCP follow-up appointment?: 05/28/23 Follow-up Provider: [facility] Follow-up appointment confirmed?: Yes Date of Specialist follow-up appointment?: 07/22/23 Follow-Up Specialty Provider:: [facility] Do you need transportation to your follow-up appointment?: No Do you understand care options if your condition(s) worsen?: Yes-patient verbalized understanding  SDOH Interventions Today    Flowsheet Row Most Recent Value  SDOH Interventions   Food Insecurity Interventions Intervention Not Indicated  Housing Interventions Intervention Not Indicated  Transportation Interventions Intervention  Not Indicated, Patient Resources (Friends/Family)  Utilities Interventions Intervention Not Indicated       Interventions Today    Flowsheet Row Most Recent Value  Chronic Disease   Chronic disease during today's visit Other  [Depression]  General Interventions   General Interventions Discussed/Reviewed General Interventions Discussed, General Interventions Reviewed, Doctor Visits, Referral to Nurse  Halford Chessman to [facility] management for follow up.]  Doctor Visits Discussed/Reviewed Doctor Visits Discussed, Doctor Visits Reviewed  Education Interventions   Education Provided Provided Education  Provided Verbal Education On Community Resources  [medical alert system and [employer] meal plan]  Mental Health Interventions   Mental Health Discussed/Reviewed Mental Health Discussed, Mental Health Reviewed, Depression  Pharmacy Interventions   Pharmacy Dicussed/Reviewed Pharmacy Topics Discussed, Pharmacy Topics Reviewed  [Per husband and patient the Trazadone 50 mg makes her legs twitch and 25mg  she does good. The trazadone had been increased when discharged from hospital. Husband had put a call into the Dr,]  Safety Interventions   Safety Discussed/Reviewed Safety Discussed  [Rn discussed medical alert system and that it is possibly available to her free]      TOC Interventions Today    Flowsheet Row Most Recent Value  TOC Interventions   TOC Interventions Discussed/Reviewed TOC Interventions Discussed, TOC Interventions Reviewed     F/U appt with Susa Loffler [id number] 11:15  Gean Maidens BSN RN [facility] Care Management [phone]

## 2023-05-13 NOTE — Telephone Encounter (Signed)
Summary: Medication questions for nurse   Pt's husband has medication questions, concerned about the patient taking both zoloft in the am and trazodone in the pm.  Best contact: [phone]         Call to patient's husband- he states he has just spoken with nurse from hospital.  Reason for Disposition . [1] Caller has URGENT medicine question about med that PCP or specialist prescribed AND [2] triager unable to answer question  Answer Assessment - Initial Assessment Questions 1. NAME of MEDICINE: "What medicine(s) are you calling about?"     Sertraline and trazodone 2. QUESTION: "What is your question?" (e.g., double dose of medicine, side effect)     Concerned about taking these medications together- takes sertraline am and trazodone pm - patient is having benefit from medication  3. PRESCRIBER: "Who prescribed the medicine?" Reason: if prescribed by specialist, call should be referred to that group.     Hospital provider/PCP 4. SYMPTOMS: "Do you have any symptoms?" If Yes, ask: "What symptoms are you having?"  "How bad are the symptoms (e.g., mild, moderate, severe)     Insomnia, depression  Patient had twitching of foot last night with 50 mg Trazodone - patient normally takes 25 mg- only took 50 mg last because she felt she needed it.She will continue 25 mf at night for sleep.  Husband is concerned about the serotonin uptake syndrome and if PCP has concerns or if there is better alternative.  Protocols used: Medication Question Call-A-AH

## 2023-05-13 NOTE — Telephone Encounter (Signed)
Called and left patient VM informing unable to speak to [name] because there is not DPR on file to speak to [place]. Told her on VM we need to complete this at upcoming visit. Also, told her Dr Karn Cassis note and told her to call back with any further questions but she does need to get in with psychiatry.  - Shantee Hayne

## 2023-05-16 ENCOUNTER — Ambulatory Visit: Payer: Self-pay

## 2023-05-16 NOTE — Telephone Encounter (Signed)
  Chief Complaint: URI Symptoms: cough with congestion Frequency: this morning  Pertinent Negatives: Patient denies fever SOB or other sx  Disposition: [] ED /[] Urgent Care (no appt availability in office) / [] Appointment(In office/virtual)/ []  [facility]/ [x] Home Care/ [] Refused Recommended Disposition /[] [facility]/ []  Follow-up with PCP Additional Notes: Spoke with pt's husband, states pt sx started this morning. Wanting to try OTC first before scheduling an appt. Advised of care advice, recommended pt try Mucinex, Coricidin HBP and nasal spray to help with sx. If no improvement after 4-5 days or sx get worse to call back. [name] verbalized understanding.   Summary: med ? congestion   Pt's husband called in wants to know what pt can take for her congestion without it affecting her other meds she takes         Reason for Disposition  Cough with cold symptoms (e.g., runny nose, postnasal drip, throat clearing)  Answer Assessment - Initial Assessment Questions 1. ONSET: "When did the cough begin?"      This morning  2. SEVERITY: "How bad is the cough today?"      Mild to moderate  3. SPUTUM: "Describe the color of your sputum" (none, dry cough; clear, white, yellow, green)     White  5. DIFFICULTY BREATHING: "Are you having difficulty breathing?" If Yes, ask: "How bad is it?" (e.g., mild, moderate, severe)    - MILD: No SOB at rest, mild SOB with walking, speaks normally in sentences, can lie down, no retractions, pulse < 100.    - MODERATE: SOB at rest, SOB with minimal exertion and prefers to sit, cannot lie down flat, speaks in phrases, mild retractions, audible wheezing, pulse 100-120.    - SEVERE: Very SOB at rest, speaks in single words, struggling to breathe, sitting hunched forward, retractions, pulse > 120      Mild  6. FEVER: "Do you have a fever?" If Yes, ask: "What is your temperature, how was it measured, and when did it start?"     no 7. CARDIAC  HISTORY: "Do you have any history of heart disease?" (e.g., heart attack, congestive heart failure)      HTN  8. LUNG HISTORY: "Do you have any history of lung disease?"  (e.g., pulmonary embolus, asthma, emphysema)     Asthma  10. OTHER SYMPTOMS: "Do you have any other symptoms?" (e.g., runny nose, wheezing, chest pain)       Cough with congestion  Protocols used: Cough - Acute Productive-A-AH

## 2023-05-21 ENCOUNTER — Other Ambulatory Visit: Payer: Self-pay
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
## 2023-05-28 ENCOUNTER — Encounter: Payer: Self-pay | Admitting: Internal Medicine

## 2023-05-28 ENCOUNTER — Ambulatory Visit (INDEPENDENT_AMBULATORY_CARE_PROVIDER_SITE_OTHER): Payer: Medicare Other | Admitting: Internal Medicine

## 2023-05-28 VITALS — BP 118/62 | HR 86 | Ht 67.0 in | Wt 153.0 lb

## 2023-05-28 DIAGNOSIS — I25110 Atherosclerotic heart disease of native coronary artery with unstable angina pectoris: Secondary | ICD-10-CM | POA: Diagnosis not present

## 2023-05-28 DIAGNOSIS — F322 Major depressive disorder, single episode, severe without psychotic features: Secondary | ICD-10-CM | POA: Diagnosis not present

## 2023-05-28 DIAGNOSIS — I10 Essential (primary) hypertension: Secondary | ICD-10-CM | POA: Diagnosis not present

## 2023-05-28 DIAGNOSIS — I255 Ischemic cardiomyopathy: Secondary | ICD-10-CM

## 2023-05-28 DIAGNOSIS — F5101 Primary insomnia: Secondary | ICD-10-CM

## 2023-05-28 DIAGNOSIS — Z23 Encounter for immunization: Secondary | ICD-10-CM | POA: Diagnosis not present

## 2023-05-28 MED ORDER — SERTRALINE HCL 50 MG PO TABS: 50.0000 mg | ORAL_TABLET | Freq: Every day | ORAL | 1 refills | Status: DC

## 2023-05-28 MED ORDER — TICAGRELOR 90 MG PO TABS
90.0000 mg | ORAL_TABLET | Freq: Two times a day (BID) | ORAL | 1 refills | Status: DC
Start: 2023-05-28 — End: 2023-10-11

## 2023-05-28 MED ORDER — LOSARTAN POTASSIUM 50 MG PO TABS
50.0000 mg | ORAL_TABLET | Freq: Every day | ORAL | 1 refills | Status: DC
Start: 2023-05-28 — End: 2023-10-29

## 2023-05-28 NOTE — Assessment & Plan Note (Signed)
Continue Trazodone at bedtime prn

## 2023-05-28 NOTE — Assessment & Plan Note (Signed)
Recent hospitalization for suicidal ideation. Improved on Sertraline and Trazodone for sleep. Currently doing well.  No SI/HI.  Has psych appointment in 2 months.

## 2023-05-28 NOTE — Assessment & Plan Note (Signed)
Repeat ECHO 02/2023: 1. Left ventricular ejection fraction, by estimation, is 65 to 70% . The left ventricle has normal function. 2. Right ventricular systolic function is normal. The right ventricular size is normal. 3. The mitral valve is normal in structure. Mild mitral valve regurgitation. 4. The inferior vena cava is normal in size with greater than 50% respiratory variability, suggesting right atrial pressure of 3 mmHg.

## 2023-05-28 NOTE — Assessment & Plan Note (Signed)
Recommend follow up with Cardiology for missed appointment. Doing well from cardiac standpoint Home exercise program - discharged from Cardiac rehab

## 2023-05-28 NOTE — Progress Notes (Signed)
Date:  05/28/2023   Name:  Karina Freeman   DOB:  1951/04/14   MRN:  [id number]   Chief Complaint: Hypertension and Insomnia  Hypertension This is a chronic problem. The problem is controlled. Pertinent negatives include no chest pain, headaches, palpitations or shortness of breath. Past treatments include calcium channel blockers, beta blockers and angiotensin blockers.  Insomnia Primary symptoms: no sleep disturbance, difficulty falling asleep.   The onset quality is undetermined. The problem occurs every several days. The problem is unchanged. Treatments tried: trazodone. The treatment provided significant relief. PMH includes: depression.   Depression        This is a new problem.  The current episode started more than 1 month ago.   The problem has been gradually improving since onset.  Associated symptoms include insomnia.  Associated symptoms include no decreased concentration, no fatigue, no headaches and no suicidal ideas.  Past treatments include SSRIs - Selective serotonin reuptake inhibitors.  Compliance with treatment is good. CAD -  missed last cardiology appointment due to hospital stay.  BP has been slightly elevated at home. Normal here today.  She continues on Brilinta, statin and beta blocker.  Brilinta until  02/2024.  Recommend she reschedule her missed appointment.  Review of Systems  Constitutional:  Negative for fatigue and unexpected weight change.  HENT:  Negative for nosebleeds and trouble swallowing.   Eyes:  Negative for visual disturbance.  Respiratory:  Negative for cough, chest tightness, shortness of breath and wheezing.   Cardiovascular:  Negative for chest pain, palpitations and leg swelling.  Gastrointestinal:  Negative for abdominal pain, constipation and diarrhea.  Musculoskeletal:  Negative for gait problem and joint swelling.  Skin:  Negative for rash and wound.  Neurological:  Negative for dizziness, weakness, light-headedness and headaches.   Hematological:  Does not bruise/bleed easily.  Psychiatric/Behavioral:  Positive for depression and dysphoric mood. Negative for agitation, behavioral problems, decreased concentration, sleep disturbance and suicidal ideas. The patient is nervous/anxious and has insomnia.      Lab Results  Component Value Date   NA 140 05/03/2023   K 3.6 05/03/2023   CO2 27 05/03/2023   GLUCOSE 108 (H) 05/03/2023   BUN 11 05/03/2023   CREATININE 1.00 05/03/2023   CALCIUM 8.7 (L) 05/03/2023   EGFR 53 (L) 03/14/2023   GFRNONAA 60 (L) 05/03/2023   Lab Results  Component Value Date   CHOL 125 03/14/2023   HDL 52 03/14/2023   LDLCALC 56 03/14/2023   TRIG 86 03/14/2023   CHOLHDL 2.4 03/14/2023   Lab Results  Component Value Date   TSH 1.050 03/26/2023   Lab Results  Component Value Date   HGBA1C 5.4 02/15/2023   Lab Results  Component Value Date   WBC 9.5 05/03/2023   HGB 12.8 05/03/2023   HCT 39.5 05/03/2023   MCV 94.7 05/03/2023   PLT 269 05/03/2023   Lab Results  Component Value Date   ALT 20 05/03/2023   AST 19 05/03/2023   ALKPHOS 72 05/03/2023   BILITOT 1.0 05/03/2023   No results found for: "25OHVITD2", "25OHVITD3", "VD25OH"   Patient Active Problem List   Diagnosis Date Noted   Suicidal ideation 05/04/2023   Anxiety state 05/04/2023   Acute confusional state 05/04/2023   Insomnia 03/26/2023   Essential hypertension 02/21/2023   Coronary artery disease involving native coronary artery of native heart with unstable angina pectoris (HCC) 02/21/2023   Ischemic cardiomyopathy 02/16/2023   Chronic kidney disease, stage 3 (  HCC) 06/25/2016   History of stroke 02/10/2015   Osteoporosis 02/10/2015   Hyperlipidemia 02/10/2015   GERD (gastroesophageal reflux disease) 02/10/2015   Toenail fungus 02/10/2015   Severe major depression without psychotic features (HCC) 02/10/2015    Allergies  Allergen Reactions   Citalopram Hydrobromide Nausea And Vomiting   Codeine Diarrhea  and Nausea And Vomiting   Penicillin G Benzathine     Past Surgical History:  Procedure Laterality Date   APPENDECTOMY     CORONARY IMAGING/OCT N/A 02/21/2023   Procedure: CORONARY IMAGING/OCT;  Surgeon: Yvonne Kendall, MD;  Location: [facility] INVASIVE CV LAB;  Service: Cardiovascular;  Laterality: N/A;   CORONARY PRESSURE/FFR STUDY N/A 02/21/2023   Procedure: CORONARY PRESSURE/FFR STUDY;  Surgeon: Yvonne Kendall, MD;  Location: [facility] INVASIVE CV LAB;  Service: Cardiovascular;  Laterality: N/A;   CORONARY STENT INTERVENTION N/A 02/21/2023   Procedure: CORONARY STENT INTERVENTION;  Surgeon: Yvonne Kendall, MD;  Location: [facility] INVASIVE CV LAB;  Service: Cardiovascular;  Laterality: N/A;   CORONARY/GRAFT ACUTE MI REVASCULARIZATION N/A 02/15/2023   Procedure: Coronary/Graft Acute MI Revascularization;  Surgeon: Iran Ouch, MD;  Location: [facility] INVASIVE CV LAB;  Service: Cardiovascular;  Laterality: N/A;   LEFT HEART CATH AND CORONARY ANGIOGRAPHY N/A 02/15/2023   Procedure: LEFT HEART CATH AND CORONARY ANGIOGRAPHY;  Surgeon: Iran Ouch, MD;  Location: [facility] INVASIVE CV LAB;  Service: Cardiovascular;  Laterality: N/A;   LEFT HEART CATH AND CORONARY ANGIOGRAPHY N/A 02/21/2023   Procedure: LEFT HEART CATH AND CORONARY ANGIOGRAPHY;  Surgeon: Yvonne Kendall, MD;  Location: [facility] INVASIVE CV LAB;  Service: Cardiovascular;  Laterality: N/A;   TONSILLECTOMY     TUBAL LIGATION      Social History   Tobacco Use   Smoking status: Former    Current packs/day: 0.00    Types: Cigarettes    Quit date: 07/16/1973    Years since quitting: 49.9   Smokeless tobacco: Never  Vaping Use   Vaping status: Never Used  Substance Use Topics   Alcohol use: No    Alcohol/week: 0.0 standard drinks of alcohol   Drug use: No     Medication list has been reviewed and updated.  Current Meds  Medication Sig   amLODipine (NORVASC) 5 MG tablet Take 1 tablet (5 mg total) by mouth at bedtime.   aspirin 81 MG  chewable tablet Chew 1 tablet (81 mg total) by mouth daily.   atorvastatin (LIPITOR) 80 MG tablet Take 1 tablet (80 mg total) by mouth daily.   carvedilol (COREG) 12.5 MG tablet Take 1 tablet (12.5 mg total) by mouth 2 (two) times daily with a meal.   nitroGLYCERIN (NITROSTAT) 0.4 MG SL tablet Place 1 tablet (0.4 mg total) under the tongue every 5 (five) minutes as needed for chest pain.   traZODone (DESYREL) 50 MG tablet Take 1 tablet (50 mg total) by mouth at bedtime as needed for sleep.   [DISCONTINUED] losartan (COZAAR) 50 MG tablet Take 1 tablet (50 mg total) by mouth daily.   [DISCONTINUED] sertraline (ZOLOFT) 50 MG tablet Take 1 tablet (50 mg total) by mouth daily.   [DISCONTINUED] ticagrelor (BRILINTA) 90 MG TABS tablet Take 1 tablet (90 mg total) by mouth 2 (two) times daily.       05/28/2023    3:20 PM 03/26/2023    1:59 PM  GAD 7 : Generalized Anxiety Score  Nervous, Anxious, on Edge 0 0  Control/stop worrying 0 1  Worry too much - different things 0 1  Trouble relaxing 0 0  Restless 0 1  Easily annoyed or irritable 0 1  Afraid - awful might happen 0 0  Total GAD 7 Score 0 4  Anxiety Difficulty Not difficult at all Not difficult at all       05/28/2023    3:20 PM 04/04/2023    3:55 PM 03/26/2023    1:59 PM  Depression screen PHQ 2/9  Decreased Interest 0 0 1  Down, Depressed, Hopeless 0 0 0  PHQ - 2 Score 0 0 1  Altered sleeping 0 1 1  Tired, decreased energy 0 1 1  Change in appetite 0 0 0  Feeling bad or failure about yourself  0 0 0  Trouble concentrating 0 1 1  Moving slowly or fidgety/restless 0 0 1  Suicidal thoughts 0 0 0  PHQ-9 Score 0 3 5  Difficult doing work/chores Not difficult at all Not difficult at all Not difficult at all    BP Readings from Last 3 Encounters:  05/28/23 118/62  05/04/23 (!) 132/105  03/26/23 122/75    Physical Exam Vitals and nursing note reviewed.  Constitutional:      General: She is not in acute distress.     Appearance: Normal appearance. She is well-developed.  HENT:     Head: Normocephalic and atraumatic.  Neck:     Vascular: No carotid bruit.  Cardiovascular:     Rate and Rhythm: Normal rate and regular rhythm.     Pulses: Normal pulses.     Heart sounds: No murmur heard. Pulmonary:     Effort: Pulmonary effort is normal. No respiratory distress.     Breath sounds: No wheezing or rhonchi.  Abdominal:     General: Abdomen is flat.     Palpations: Abdomen is soft.  Musculoskeletal:     Cervical back: Normal range of motion.     Right lower leg: No edema.     Left lower leg: No edema.  Lymphadenopathy:     Cervical: No cervical adenopathy.  Skin:    General: Skin is warm and dry.     Capillary Refill: Capillary refill takes less than 2 seconds.     Findings: No rash.  Neurological:     General: No focal deficit present.     Mental Status: She is alert and oriented to person, place, and time.  Psychiatric:        Attention and Perception: Attention normal.        Mood and Affect: Mood normal.        Speech: Speech normal.        Behavior: Behavior normal.     Wt Readings from Last 3 Encounters:  05/28/23 153 lb (69.4 kg)  05/03/23 154 lb 5.2 oz (70 kg)  04/04/23 154 lb 6.4 oz (70 kg)    BP 118/62 (BP Location: Left Arm, Cuff Size: Normal)   Pulse 86   Ht 5\' 7"  (1.702 m)   Wt 153 lb (69.4 kg)   LMP  (LMP Unknown)   SpO2 97%   BMI 23.96 kg/m   Assessment and Plan:  Problem List Items Addressed This Visit       Unprioritized   Ischemic cardiomyopathy    Repeat ECHO 02/2023: 1. Left ventricular ejection fraction, by estimation, is 65 to 70% . The left ventricle has normal function. 2. Right ventricular systolic function is normal. The right ventricular size is normal. 3. The mitral valve is normal in structure. Mild mitral valve  regurgitation. 4. The inferior vena cava is normal in size with greater than 50% respiratory variability, suggesting right atrial pressure of  3 mmHg.      Relevant Medications   losartan (COZAAR) 50 MG tablet   Essential hypertension    Controlled BP with normal exam. Current regimen is amlodipine, coreg and losartan. Will continue same medications; encourage continued reduced sodium diet.       Relevant Medications   losartan (COZAAR) 50 MG tablet   Coronary artery disease involving native coronary artery of native heart with unstable angina pectoris (HCC)    Recommend follow up with Cardiology for missed appointment. Doing well from cardiac standpoint Home exercise program - discharged from Cardiac rehab      Relevant Medications   ticagrelor (BRILINTA) 90 MG TABS tablet   losartan (COZAAR) 50 MG tablet   Insomnia    Continue Trazodone at bedtime prn      Severe major depression without psychotic features (HCC) - Primary    Recent hospitalization for suicidal ideation. Improved on Sertraline and Trazodone for sleep. Currently doing well.  No SI/HI.  Has psych appointment in 2 months.      Relevant Medications   sertraline (ZOLOFT) 50 MG tablet   Other Visit Diagnoses     Need for vaccination for pneumococcus       Relevant Orders   Pneumococcal conjugate vaccine 20-valent (Completed)       Return in about 4 months (around 09/25/2023) for HTN.    Reubin Milan, MD [facility] Primary Care and Sports Medicine [place]

## 2023-05-28 NOTE — Assessment & Plan Note (Signed)
Controlled BP with normal exam. Current regimen is amlodipine, coreg and losartan. Will continue same medications; encourage continued reduced sodium diet.

## 2023-05-30 ENCOUNTER — Other Ambulatory Visit: Payer: Self-pay

## 2023-05-30 NOTE — Patient Outreach (Signed)
Care Management  Transitions of Care Program Transitions of Care Post-discharge week 3   05/30/2023 Name: Karina Freeman MRN: [id number] DOB: 1951-05-15  Subjective: Karina Freeman is a 72 y.o. year old female who is a primary care patient of Reubin Milan, MD. The Care Management team Engaged with patient Engaged with patient by telephone to assess and address transitions of care needs.   Consent to Services:  Patient was given information about care management services, agreed to services, and gave verbal consent to participate.   Assessment:     Patient voices no new complaints Patient has not developed/ reported any new Medical issues / Dx or acute changes.- since last follow-up call for most recent  Hospital stay    10/19-10/24  / 2024  She is doing very well . No SI, sleeping well with occ Trazadone use for Insomnia Had PCP follow-up with no new orders She has Cardiology f/y Jan 2025. Appt with [facility] 07/22/23, Mammogram 11/20 . She has had no medication changes.Back to usual routine.  Patient educated on red flags s/s to watch for and was encouraged to report any of these identified , any new symptoms , changes in baseline or  medication regimen,  change in health status  /  well-being, or safety concerns to PCP and / or the  [facility] Case Management team .        SDOH Interventions    Flowsheet Row Patient Outreach from 05/21/2023 in [place] POPULATION HE[facility] DEPARTMENT Telephone from 05/13/2023 in [facility] Community Care Coordination Office Visit from 03/26/2023 in [facility] Primary [facility] & [facility] Medicine at [facility]  SDOH Interventions     Food Insecurity Interventions Intervention Not Indicated Intervention Not Indicated --  Housing Interventions Intervention Not Indicated Intervention Not Indicated --  Transportation Interventions Intervention Not Indicated, Patient Resources (Friends/Family) Intervention Not Indicated, Patient Resources  (Friends/Family) --  Utilities Interventions Intervention Not Indicated Intervention Not Indicated --  Depression Interventions/Treatment  -- -- Counseling        Goals Addressed   None     Plan:  The patient has requested to complete the  [facility] Program. Condition is stable No further acute needs identified at this time. She and her spouse did not feel additional follow-up was needed at this time. Chronic conditions and ongoing care is  managed thru collaboration with  PCP,  Specialists and additional Healthcare Providers if indicated . Patient verbalized understanding of ongoing plan of care.  SDOH needs have been screened and interventions provided if identified.  Reviewed current home medications -- provided education as needed.  Discussed rationale of use, how/when to take medications. Patient is aware of potential side effects, and was encouraged to notify PCP for any changes in condition or signs / symptoms not relieved  with interventions.    Patient will call 911 for Medical Emergencies or Life -Threatening or report to a local emergency department or urgent care.   Patient was encouraged to Contact PCP  with any questions or concerns regarding ongoing  medical care, any  difficulty obtaining or picking up  prescriptions, any  changes or  worsening in  condition including signs / symptoms not relieved  with interventions  Patient had no additional questions or concerns at this time. Current needs addressed.     The patient has been provided with contact information for the care management team and has been advised to call with any health related questions or concerns.   [employer]  Sharon Seller , BSN, RN Care Management Coordinator [place]   [facility] [email or web address] Direct Dial: [phone]

## 2023-06-05 ENCOUNTER — Ambulatory Visit
Admission: RE | Admit: 2023-06-05 | Discharge: 2023-06-05 | Disposition: A | Discharge status: Home / Self Care | Payer: Medicare Other | Source: Ambulatory Visit | Attending: Internal Medicine | Admitting: Internal Medicine

## 2023-06-05 DIAGNOSIS — Z1231 Encounter for screening mammogram for malignant neoplasm of breast: Secondary | ICD-10-CM | POA: Insufficient documentation

## 2023-06-12 ENCOUNTER — Ambulatory Visit: Payer: Medicare Other | Admitting: Internal Medicine

## 2023-07-12 ENCOUNTER — Ambulatory Visit: Payer: Self-pay

## 2023-07-12 NOTE — Telephone Encounter (Signed)
Chief Complaint: Low Blood pressure  Symptoms: None Pertinent Negatives: Patient denies lightheadedness, weakness, or dizziness Disposition: [] ED /[] Urgent Care (no appt availability in office) / [] Appointment(In office/virtual)/ []  [place] [facility]/ [] Home Care/ [] Refused Recommended Disposition /[] [place] [facility]/ [x]  Follow-up with PCP Additional Notes: Patient's husband reported lower than usual blood pressures for the patient today. He reported a reading of 105/57 at 1341 and a second reading of 103/56 about 10 minutes later. Patient denies any symptoms of low blood pressure. Patient states she takes the following medicines for blood pressure control: Amlodipine 5 MG, Losartan 50 MG and Carvedilol 12.5 MG. Care advice was given and also recommended patient report blood pressure readings to Cardiologist as well. Patient and husband verbalized understanding and stated they would like PCP to know what the blood pressure readings are.  Summary: Low BP, questions for a nurse   Pt's husband called reporting that the patient has had 3 blood pressure readings that have been significantly lower than usual. Not below the red word threshold but low.  Best contact: [phone]     Reason for Disposition  Wants doctor to measure BP  Answer Assessment - Initial Assessment Questions 1. BLOOD PRESSURE: "What is the blood pressure?" "Did you take at least two measurements 5 minutes apart?"     1341 Bp-105/57 and BP-103/56 most recent  2. ONSET: "When did you take your blood pressure?"     Today  3. HOW: "How did you obtain the blood pressure?" (e.g., visiting nurse, automatic home BP monitor)     Automatic home BP Monitor  4. HISTORY: "Do you have a history of low blood pressure?" "What is your blood pressure normally?"     No, high blood pressure  5. MEDICINES: "Are you taking any medications for blood pressure?" If Yes, ask: "Have they been changed recently?"     Losartan, amlodipine,  Carvidol  6. PULSE RATE: "Do you know what your pulse rate is?"      67 7. OTHER SYMPTOMS: "Have you been sick recently?" "Have you had a recent injury?"     No  Protocols used: Blood Pressure - Low-A-AH

## 2023-07-15 NOTE — Telephone Encounter (Signed)
Spoke with patient and informed.  - Karina Freeman

## 2023-07-22 ENCOUNTER — Ambulatory Visit: Payer: Medicare Other | Admitting: Psychiatry

## 2023-07-31 ENCOUNTER — Ambulatory Visit: Payer: Medicare Other | Admitting: Psychiatry

## 2023-08-02 ENCOUNTER — Emergency Department (EMERGENCY_DEPARTMENT_HOSPITAL)
Admission: EM | Admit: 2023-08-02 | Discharge: 2023-08-03 | Disposition: A | Discharge status: Other Acute Inpatient Hospital | Payer: Medicare Other | Source: Home / Self Care | Attending: Emergency Medicine | Admitting: Emergency Medicine

## 2023-08-02 ENCOUNTER — Other Ambulatory Visit: Payer: Self-pay

## 2023-08-02 DIAGNOSIS — F322 Major depressive disorder, single episode, severe without psychotic features: Secondary | ICD-10-CM | POA: Diagnosis present

## 2023-08-02 DIAGNOSIS — M810 Age-related osteoporosis without current pathological fracture: Secondary | ICD-10-CM | POA: Diagnosis present

## 2023-08-02 DIAGNOSIS — F419 Anxiety disorder, unspecified: Secondary | ICD-10-CM | POA: Insufficient documentation

## 2023-08-02 DIAGNOSIS — F332 Major depressive disorder, recurrent severe without psychotic features: Secondary | ICD-10-CM | POA: Insufficient documentation

## 2023-08-02 DIAGNOSIS — Z1152 Encounter for screening for COVID-19: Secondary | ICD-10-CM | POA: Insufficient documentation

## 2023-08-02 DIAGNOSIS — I5021 Acute systolic (congestive) heart failure: Secondary | ICD-10-CM | POA: Insufficient documentation

## 2023-08-02 DIAGNOSIS — Z8673 Personal history of transient ischemic attack (TIA), and cerebral infarction without residual deficits: Secondary | ICD-10-CM

## 2023-08-02 DIAGNOSIS — R45851 Suicidal ideations: Secondary | ICD-10-CM | POA: Insufficient documentation

## 2023-08-02 DIAGNOSIS — I110 Hypertensive heart disease with heart failure: Secondary | ICD-10-CM | POA: Insufficient documentation

## 2023-08-02 DIAGNOSIS — F411 Generalized anxiety disorder: Secondary | ICD-10-CM | POA: Diagnosis present

## 2023-08-02 LAB — ACETAMINOPHEN LEVEL: Acetaminophen (Tylenol), Serum: <10 ug/mL — ABNORMAL LOW (ref 10–30)

## 2023-08-02 LAB — CBC
HCT: 39 % (ref 36.0–46.0)
Hemoglobin: 12.7 g/dL (ref 12.0–15.0)
MCH: 31.5 pg (ref 26.0–34.0)
MCHC: 32.6 g/dL (ref 30.0–36.0)
MCV: 96.8 fL (ref 80.0–100.0)
Platelets: 268 10*3/uL (ref 150–400)
RBC: 4.03 MIL/uL (ref 3.87–5.11)
RDW: 13 % (ref 11.5–15.5)
WBC: 12.4 10*3/uL — ABNORMAL HIGH (ref 4.0–10.5)
nRBC: 0 % (ref 0.0–0.2)

## 2023-08-02 LAB — ETHANOL: Alcohol, Ethyl (B): <10 mg/dL (ref <10)

## 2023-08-02 LAB — SALICYLATE LEVEL: Salicylate Lvl: <7 mg/dL — ABNORMAL LOW (ref 7.0–30.0)

## 2023-08-02 MED ORDER — ASPIRIN 81 MG PO CHEW
81.0000 mg | CHEWABLE_TABLET | Freq: Every day | ORAL | Status: DC
  Administered 2023-08-03: 81 mg via ORAL
  Filled 2023-08-02: qty 1

## 2023-08-02 MED ORDER — SERTRALINE HCL 50 MG PO TABS
50.0000 mg | ORAL_TABLET | Freq: Every day | ORAL | Status: DC
  Administered 2023-08-03: 50 mg via ORAL
  Filled 2023-08-02: qty 1

## 2023-08-02 MED ORDER — LOSARTAN POTASSIUM 50 MG PO TABS
50.0000 mg | ORAL_TABLET | Freq: Every day | ORAL | Status: DC
  Administered 2023-08-03: 50 mg via ORAL
  Filled 2023-08-02: qty 1

## 2023-08-02 MED ORDER — TICAGRELOR 90 MG PO TABS
90.0000 mg | ORAL_TABLET | Freq: Two times a day (BID) | ORAL | Status: DC
  Administered 2023-08-03 (×2): 90 mg via ORAL
  Filled 2023-08-02 (×2): qty 1

## 2023-08-02 MED ORDER — CARVEDILOL 6.25 MG PO TABS
12.5000 mg | ORAL_TABLET | Freq: Two times a day (BID) | ORAL | Status: DC
  Administered 2023-08-03: 12.5 mg via ORAL
  Filled 2023-08-02: qty 2

## 2023-08-02 MED ORDER — ATORVASTATIN CALCIUM 20 MG PO TABS
80.0000 mg | ORAL_TABLET | Freq: Every day | ORAL | Status: DC
  Administered 2023-08-03: 80 mg via ORAL
  Filled 2023-08-02: qty 4

## 2023-08-02 NOTE — ED Notes (Signed)
Husband took belongings home with him

## 2023-08-02 NOTE — ED Triage Notes (Signed)
Pt husband and daughter left pt at home to go run errands and found that pt right ear was bleeding when they returned home and found a wire clothes hanger on the bathroom floor. PT does have marks on her neck. RN asked pt if she attempted to kill herself today and pt sts " maybe".

## 2023-08-02 NOTE — ED Notes (Signed)
This tech along with Alger Simons, RN, assisted pt with dress out.  Pt belongings:   Actuary Pair of white-grey tennis shoes Pair of white socks Red and black pants Blue underwear

## 2023-08-02 NOTE — ED Notes (Addendum)
Karina Freeman(husband) [phone] Please call if patient is moved from ER and with any questions about patient
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
## 2023-08-03 ENCOUNTER — Encounter: Payer: Self-pay | Admitting: Psychiatry

## 2023-08-03 ENCOUNTER — Inpatient Hospital Stay
Admission: AD | Admit: 2023-08-03 | Discharge: 2023-08-28 | DRG: 885 | Disposition: A | Discharge status: Home / Self Care | Payer: Medicare Other | Source: Other Acute Inpatient Hospital | Attending: Psychiatry | Admitting: Psychiatry

## 2023-08-03 DIAGNOSIS — Z8249 Family history of ischemic heart disease and other diseases of the circulatory system: Secondary | ICD-10-CM

## 2023-08-03 DIAGNOSIS — I252 Old myocardial infarction: Secondary | ICD-10-CM | POA: Diagnosis not present

## 2023-08-03 DIAGNOSIS — Z9151 Personal history of suicidal behavior: Secondary | ICD-10-CM | POA: Diagnosis not present

## 2023-08-03 DIAGNOSIS — Z91148 Patient's other noncompliance with medication regimen for other reason: Secondary | ICD-10-CM | POA: Diagnosis not present

## 2023-08-03 DIAGNOSIS — F329 Major depressive disorder, single episode, unspecified: Principal | ICD-10-CM | POA: Diagnosis present

## 2023-08-03 DIAGNOSIS — I10 Essential (primary) hypertension: Secondary | ICD-10-CM | POA: Diagnosis present

## 2023-08-03 DIAGNOSIS — I6782 Cerebral ischemia: Secondary | ICD-10-CM | POA: Diagnosis not present

## 2023-08-03 DIAGNOSIS — Z7902 Long term (current) use of antithrombotics/antiplatelets: Secondary | ICD-10-CM | POA: Diagnosis not present

## 2023-08-03 DIAGNOSIS — G9340 Encephalopathy, unspecified: Secondary | ICD-10-CM | POA: Diagnosis not present

## 2023-08-03 DIAGNOSIS — Z888 Allergy status to other drugs, medicaments and biological substances status: Secondary | ICD-10-CM

## 2023-08-03 DIAGNOSIS — I429 Cardiomyopathy, unspecified: Secondary | ICD-10-CM | POA: Diagnosis not present

## 2023-08-03 DIAGNOSIS — N390 Urinary tract infection, site not specified: Secondary | ICD-10-CM | POA: Diagnosis present

## 2023-08-03 DIAGNOSIS — I5042 Chronic combined systolic (congestive) and diastolic (congestive) heart failure: Secondary | ICD-10-CM | POA: Diagnosis present

## 2023-08-03 DIAGNOSIS — I255 Ischemic cardiomyopathy: Secondary | ICD-10-CM | POA: Diagnosis present

## 2023-08-03 DIAGNOSIS — F411 Generalized anxiety disorder: Secondary | ICD-10-CM | POA: Diagnosis present

## 2023-08-03 DIAGNOSIS — N183 Chronic kidney disease, stage 3 unspecified: Secondary | ICD-10-CM | POA: Diagnosis present

## 2023-08-03 DIAGNOSIS — F322 Major depressive disorder, single episode, severe without psychotic features: Secondary | ICD-10-CM | POA: Diagnosis present

## 2023-08-03 DIAGNOSIS — Z880 Allergy status to penicillin: Secondary | ICD-10-CM | POA: Diagnosis not present

## 2023-08-03 DIAGNOSIS — I110 Hypertensive heart disease with heart failure: Secondary | ICD-10-CM | POA: Diagnosis present

## 2023-08-03 DIAGNOSIS — Z7401 Bed confinement status: Secondary | ICD-10-CM | POA: Diagnosis not present

## 2023-08-03 DIAGNOSIS — Z7982 Long term (current) use of aspirin: Secondary | ICD-10-CM

## 2023-08-03 DIAGNOSIS — E785 Hyperlipidemia, unspecified: Secondary | ICD-10-CM | POA: Diagnosis not present

## 2023-08-03 DIAGNOSIS — Z955 Presence of coronary angioplasty implant and graft: Secondary | ICD-10-CM | POA: Diagnosis not present

## 2023-08-03 DIAGNOSIS — Z87891 Personal history of nicotine dependence: Secondary | ICD-10-CM

## 2023-08-03 DIAGNOSIS — M810 Age-related osteoporosis without current pathological fracture: Secondary | ICD-10-CM | POA: Diagnosis present

## 2023-08-03 DIAGNOSIS — I671 Cerebral aneurysm, nonruptured: Secondary | ICD-10-CM | POA: Diagnosis not present

## 2023-08-03 DIAGNOSIS — Z79899 Other long term (current) drug therapy: Secondary | ICD-10-CM

## 2023-08-03 DIAGNOSIS — Z885 Allergy status to narcotic agent status: Secondary | ICD-10-CM | POA: Diagnosis not present

## 2023-08-03 DIAGNOSIS — K219 Gastro-esophageal reflux disease without esophagitis: Secondary | ICD-10-CM | POA: Diagnosis present

## 2023-08-03 DIAGNOSIS — I639 Cerebral infarction, unspecified: Secondary | ICD-10-CM | POA: Diagnosis not present

## 2023-08-03 DIAGNOSIS — R9082 White matter disease, unspecified: Secondary | ICD-10-CM | POA: Diagnosis not present

## 2023-08-03 DIAGNOSIS — I700 Atherosclerosis of aorta: Secondary | ICD-10-CM | POA: Diagnosis not present

## 2023-08-03 DIAGNOSIS — Z8673 Personal history of transient ischemic attack (TIA), and cerebral infarction without residual deficits: Secondary | ICD-10-CM

## 2023-08-03 DIAGNOSIS — R45851 Suicidal ideations: Secondary | ICD-10-CM

## 2023-08-03 DIAGNOSIS — F332 Major depressive disorder, recurrent severe without psychotic features: Principal | ICD-10-CM | POA: Diagnosis present

## 2023-08-03 DIAGNOSIS — Z818 Family history of other mental and behavioral disorders: Secondary | ICD-10-CM | POA: Diagnosis not present

## 2023-08-03 LAB — COMPREHENSIVE METABOLIC PANEL
ALT: 29 U/L (ref 0–44)
AST: 28 U/L (ref 15–41)
Albumin: 4.4 g/dL (ref 3.5–5.0)
Alkaline Phosphatase: 71 U/L (ref 38–126)
Anion gap: 11 (ref 5–15)
BUN: 16 mg/dL (ref 8–23)
CO2: 24 mmol/L (ref 22–32)
Calcium: 9 mg/dL (ref 8.9–10.3)
Chloride: 104 mmol/L (ref 98–111)
Creatinine, Ser: 0.96 mg/dL (ref 0.44–1.00)
GFR, Estimated: >60 mL/min (ref >60)
Glucose, Bld: 105 mg/dL — ABNORMAL HIGH (ref 70–99)
Potassium: 3.7 mmol/L (ref 3.5–5.1)
Sodium: 139 mmol/L (ref 135–145)
Total Bilirubin: 1.2 mg/dL (ref 0.0–1.2)
Total Protein: 8.1 g/dL (ref 6.5–8.1)

## 2023-08-03 LAB — URINALYSIS, ROUTINE W REFLEX MICROSCOPIC
Bilirubin Urine: NEGATIVE
Glucose, UA: NEGATIVE mg/dL
Hgb urine dipstick: NEGATIVE
Ketones, ur: NEGATIVE mg/dL
Nitrite: NEGATIVE
Protein, ur: 30 mg/dL — AB
Specific Gravity, Urine: 1.024 (ref 1.005–1.030)
pH: 5 (ref 5.0–8.0)

## 2023-08-03 LAB — URINE DRUG SCREEN, QUALITATIVE (ARMC ONLY)
Amphetamines, Ur Screen: NOT DETECTED
Barbiturates, Ur Screen: NOT DETECTED
Benzodiazepine, Ur Scrn: NOT DETECTED
Cannabinoid 50 Ng, Ur ~~LOC~~: POSITIVE — AB
Cocaine Metabolite,Ur ~~LOC~~: NOT DETECTED
MDMA (Ecstasy)Ur Screen: NOT DETECTED
Methadone Scn, Ur: NOT DETECTED
Opiate, Ur Screen: NOT DETECTED
Phencyclidine (PCP) Ur S: NOT DETECTED
Tricyclic, Ur Screen: NOT DETECTED

## 2023-08-03 LAB — RESP PANEL BY RT-PCR (RSV, FLU A&B, COVID)  RVPGX2
Influenza A by PCR: NEGATIVE
Influenza B by PCR: NEGATIVE
Resp Syncytial Virus by PCR: NEGATIVE
SARS Coronavirus 2 by RT PCR: NEGATIVE

## 2023-08-03 MED ORDER — MAGNESIUM HYDROXIDE 400 MG/5ML PO SUSP: 30.0000 mL | Freq: Every day | ORAL | Status: DC | PRN

## 2023-08-03 MED ORDER — TICAGRELOR 90 MG PO TABS
90.0000 mg | ORAL_TABLET | Freq: Two times a day (BID) | ORAL | Status: DC
  Administered 2023-08-03 – 2023-08-28 (×49): 90 mg via ORAL
  Filled 2023-08-03 (×51): qty 1

## 2023-08-03 MED ORDER — DIPHENHYDRAMINE HCL 25 MG PO CAPS: 50.0000 mg | ORAL_CAPSULE | Freq: Three times a day (TID) | ORAL | Status: DC | PRN

## 2023-08-03 MED ORDER — ACETAMINOPHEN 325 MG PO TABS
650.0000 mg | ORAL_TABLET | Freq: Four times a day (QID) | ORAL | Status: DC | PRN
  Administered 2023-08-04: 650 mg via ORAL
  Filled 2023-08-03: qty 2

## 2023-08-03 MED ORDER — ATORVASTATIN CALCIUM 80 MG PO TABS
80.0000 mg | ORAL_TABLET | Freq: Every day | ORAL | Status: DC
  Administered 2023-08-04 – 2023-08-28 (×25): 80 mg via ORAL
  Filled 2023-08-03 (×25): qty 1

## 2023-08-03 MED ORDER — HYDROXYZINE HCL 25 MG PO TABS
25.0000 mg | ORAL_TABLET | Freq: Three times a day (TID) | ORAL | Status: DC | PRN
  Administered 2023-08-13 – 2023-08-28 (×7): 25 mg via ORAL
  Filled 2023-08-03 (×8): qty 1

## 2023-08-03 MED ORDER — LOSARTAN POTASSIUM 25 MG PO TABS
50.0000 mg | ORAL_TABLET | Freq: Every day | ORAL | Status: DC
  Administered 2023-08-04 – 2023-08-28 (×25): 50 mg via ORAL
  Filled 2023-08-03 (×25): qty 2

## 2023-08-03 MED ORDER — LORAZEPAM 2 MG/ML IJ SOLN
2.0000 mg | Freq: Three times a day (TID) | INTRAMUSCULAR | Status: DC | PRN
Start: 2023-08-03 — End: 2023-08-28

## 2023-08-03 MED ORDER — TRAZODONE HCL 50 MG PO TABS
50.0000 mg | ORAL_TABLET | Freq: Every evening | ORAL | Status: DC | PRN
  Administered 2023-08-03 – 2023-08-27 (×15): 50 mg via ORAL
  Filled 2023-08-03 (×15): qty 1

## 2023-08-03 MED ORDER — CARVEDILOL 6.25 MG PO TABS
12.5000 mg | ORAL_TABLET | Freq: Two times a day (BID) | ORAL | Status: DC
  Administered 2023-08-03 – 2023-08-28 (×46): 12.5 mg via ORAL
  Filled 2023-08-03 (×48): qty 2

## 2023-08-03 MED ORDER — LORAZEPAM 2 MG/ML IJ SOLN: 2.0000 mg | Freq: Three times a day (TID) | INTRAMUSCULAR | Status: DC | PRN

## 2023-08-03 MED ORDER — ALUM & MAG HYDROXIDE-SIMETH 200-200-20 MG/5ML PO SUSP
30.0000 mL | ORAL | Status: DC | PRN
  Administered 2023-08-22: 30 mL via ORAL
  Filled 2023-08-03: qty 30

## 2023-08-03 MED ORDER — HALOPERIDOL LACTATE 5 MG/ML IJ SOLN
10.0000 mg | Freq: Three times a day (TID) | INTRAMUSCULAR | Status: DC | PRN
Start: 2023-08-03 — End: 2023-08-28

## 2023-08-03 MED ORDER — HALOPERIDOL LACTATE 5 MG/ML IJ SOLN: 5.0000 mg | Freq: Three times a day (TID) | INTRAMUSCULAR | Status: DC | PRN

## 2023-08-03 MED ORDER — SERTRALINE HCL 50 MG PO TABS
50.0000 mg | ORAL_TABLET | Freq: Every day | ORAL | Status: DC
  Administered 2023-08-04 – 2023-08-05 (×2): 50 mg via ORAL
  Filled 2023-08-03 (×2): qty 1

## 2023-08-03 MED ORDER — DIPHENHYDRAMINE HCL 50 MG/ML IJ SOLN
50.0000 mg | Freq: Three times a day (TID) | INTRAMUSCULAR | Status: DC | PRN
Start: 2023-08-03 — End: 2023-08-28

## 2023-08-03 MED ORDER — ASPIRIN 81 MG PO CHEW
81.0000 mg | CHEWABLE_TABLET | Freq: Every day | ORAL | Status: DC
  Administered 2023-08-04 – 2023-08-28 (×25): 81 mg via ORAL
  Filled 2023-08-03 (×25): qty 1

## 2023-08-03 MED ORDER — HALOPERIDOL 5 MG PO TABS: 5.0000 mg | ORAL_TABLET | Freq: Three times a day (TID) | ORAL | Status: DC | PRN

## 2023-08-03 MED ORDER — DIPHENHYDRAMINE HCL 50 MG/ML IJ SOLN: 50.0000 mg | Freq: Three times a day (TID) | INTRAMUSCULAR | Status: DC | PRN

## 2023-08-03 NOTE — BH Assessment (Addendum)
Patient is to be admitted to [facility] by Psychiatric Nurse Practitioner Lerry Liner.  Attending Physician will be Dr. Marlou Porch.   Patient has been assigned to room L36, by [facility] Charge Nurse Talbert Forest.    ER staff is aware of the admission: [facility] ER Secretary   Dr. Fanny Bien, ER MD  Bosie Clos Patient's Nurse   Patient's husband Jadalise Delaine [phone] has been contacted and informed of the patient transferring to [facility]. Husband has been given unit phone number.

## 2023-08-03 NOTE — Progress Notes (Signed)
Patient declined to eat dinner.

## 2023-08-03 NOTE — Progress Notes (Signed)
 Patient given her code and asked to call her husband if she would like him to be able to call and visit.  Patient currently on the phone.

## 2023-08-03 NOTE — ED Notes (Signed)
Pt IVC/recommend inpatient psychiatric hospitalization when medically cleared.

## 2023-08-03 NOTE — ED Notes (Signed)
Report given to [facility], states can take pt at 1000-1030 this morning.

## 2023-08-03 NOTE — Group Note (Signed)
Date:  08/03/2023 Time:  8:43 PM  Group Topic/Focus:  Overcoming Stress:   The focus of this group is to define stress and help patients assess their triggers.    Participation Level:  None  Participation Quality:  Inattentive  Affect:  Flat  Cognitive:  Lacking  Insight: None  Engagement in Group:  Lacking  Modes of Intervention:  Orientation  Additional Comments:    Garry Heater 08/03/2023, 8:43 PM

## 2023-08-03 NOTE — Consult Note (Signed)
 Karina Freeman Initial  Patient Name: .Karina Freeman  MRN: [id number]  DOB: 1951-06-11  Freeman Order details:  Orders (From admission, onward)     Start     Ordered   08/02/23 2345  IP Freeman TO PSYCHIATRY       Ordering Provider: Sharyn Creamer, MD  Provider:  (Not yet assigned)  Question Answer Comment  Place call to: psych md   Reason for Freeman Freeman   Diagnosis/Clinical Info for Freeman: suicidal ideations, ivc      08/02/23 2345   08/02/23 2225  Freeman TO CALL ACT TEAM       Ordering Provider: Sharyn Creamer, MD  Provider:  (Not yet assigned)  Question:  Reason for Freeman?  Answer:  SI   08/02/23 2224             Mode of Visit: Tele-visit Virtual Statement:TELE PSYCHIATRY ATTESTATION & CONSENT As the provider for this telehealth Freeman, I attest that I verified the patient's identity using two separate identifiers, introduced myself to the patient, provided my credentials, disclosed my location, and performed this encounter via a HIPAA-compliant, real-time, face-to-face, two-way, interactive audio and video platform and with the full consent and agreement of the patient (or guardian as applicable.) Patient physical location: [facility]. Telehealth provider physical location: home office in state of [place].   Video start time: 200 Video end time: 245    Psychiatry Freeman Evaluation  Service Date: August 03, 2023 LOS:  LOS: 0 days  Chief Complaint SA  Primary Psychiatric Diagnoses  Active Hospital problems: Principal Problem:   Suicidal ideation Active Problems:   History of stroke   Osteoporosis   Anxiety state   Severe major depression without psychotic features (HCC) Assessment  Karina Freeman is a 73 y.o. female admitted: Presented to the EDfor 08/02/2023 10:53 PM for depression and suicide attempt. She carries the psychiatric diagnoses of severe major depression disorder.     Her current presentation of depression and Si is most consistent  with her MDD diagnosis. She meets criteria for inpatient hospitalization  based on current suicide attempt.  Current outpatient psychotropic medications include trazodone 50mg  and sertraline 50mg  and historically she has had a positive response to these medications. She was not compliant with medications prior to admission as evidenced by current psychiatric decline. On initial examination, patient was not willing to engage in assessment. Please see plan below for detailed recommendations.   Diagnoses:  Active Hospital problems: Principal Problem:   Suicidal ideation Active Problems:   History of stroke   Osteoporosis   Anxiety state   Severe major depression without psychotic features (HCC)    Plan   ## Psychiatric Medication Recommendations:  Restart home meds once reconciled  ## Medical Decision Making Capacity: Not specifically addressed in this encounter  ## Further Work-up:  Karina Freeman was admitted to [facility] under the service of Sharyn Creamer, MD for Suicidal ideation, crisis management, and stabilization. Routine labs ordered, which include  Lab Orders         Comprehensive metabolic panel         Ethanol         Salicylate level         Acetaminophen level         cbc         Urine Drug Screen, Qualitative         Urinalysis, Routine w reflex microscopic -Urine, Clean Catch    Medication  Management: Medications started  aspirin  81 mg Oral Daily   atorvastatin  80 mg Oral Daily   carvedilol  12.5 mg Oral BID WC   losartan  50 mg Oral Daily   sertraline  50 mg Oral Daily   ticagrelor  90 mg Oral BID   Will maintain observation checks every 15 minutes for safety. Psychosocial education regarding relapse prevention and self-care; social and communication  Social work will Freeman with family for collateral information and discuss discharge and follow up plan.   ## Disposition:-- We recommend inpatient psychiatric hospitalization when  medically cleared. Patient is under voluntary admission status at this time; please IVC if attempts to leave hospital.  ## Behavioral / Environmental: - No specific recommendations at this time.     ## Safety and Observation Level:  - Based on my clinical evaluation, I estimate the patient to be at high risk of self harm in the current setting. - At this time, we recommend  routine. This decision is based on my review of the chart including patient's history and current presentation, interview of the patient, mental status examination, and consideration of suicide risk including evaluating suicidal ideation, plan, intent, suicidal or self-harm behaviors, risk factors, and protective factors. This judgment is based on our ability to directly address suicide risk, implement suicide prevention strategies, and develop a safety plan while the patient is in the clinical setting. Please contact our team if there is a concern that risk level has changed.  CSSR Risk Category:C-SSRS RISK CATEGORY: High Risk  Suicide Risk Assessment: Patient has following modifiable risk factors for suicide: active suicidal ideation, which we are addressing by recommending inpatient. Patient has the following protective factors against suicide: Access to outpatient mental health care, Supportive family, and Supportive friends  Thank you for this Freeman request. Recommendations have been communicated to the primary team.  We will recommend inpatient  at this time.   Jearld Lesch, NP       History of Present Illness  Relevant Aspects of Hospital ED Course:  Admitted on 08/02/2023 for suicidal ideation.  Patient Report:  At this tome patient is a poor historian and refused to talk. Patient would not answer if she was still suicidal.  She just remained laying in bed staring at the ceiling.   Per triage note Pt husband and daughter left pt at home to go run errands and found that pt right ear was bleeding when they  returned home and found a wire clothes hanger on the bathroom floor. PT does have marks on her neck. RN asked pt if she attempted to kill herself today and pt sts " maybe".     Psych ROS:  Depression: yes   Collateral information:  TTS Contacted patients husband at 3:00am on 08/02/2023 who reports "I went out for a couple of hours and I was bringing her back some food, this was around 7pm, I came in and she wasn't on the couch so I called for her but she didn't answer, I went in her bathroom and I thought she had fell because she was on the floor, then I saw she had a wire hanger on her neck and I asked what she was trying to do but she wouldn't tell me." "I called her [name] and she came over and tried talking to her, she was only giving her little bits, I asked if she was trying to hurt herself and she said yeah, she said she didn't want  to live anymore, her daughter asked her why but she wouldn't tell us." "She's been in the hospital before in October and they put her on Sertraline and Zoloft, I don't know if this is a adverse reaction to that, but when she got discharged she was doing good, but lately these past few weeks she has been withdrawn, she hasn't been reading her bible or doing her word searches." Patient's husband reports that the patient is not currently seeing a outpatient provider because "I had to re-schedule the appointment."   ROS   Psychiatric and Social History  Psychiatric History:  Information collected from husband  Prev Dx/Sx: MDD Current Psych Provider: trazodone and Zoloft Home Meds (current): Zoloft Previous Med Trials: Zoloft    Prior Psych Hospitalization: October 2024     Exam Findings  Physical Exam:  Vital Signs:  Temp:  [98.3 F (36.8 C)] 98.3 F (36.8 C) (01/17 2222) Pulse Rate:  [70] 70 (01/17 2222) Resp:  [18] 18 (01/17 2222) BP: (170)/(93) 170/93 (01/17 2222) SpO2:  [99 %] 99 % (01/17 2222) Weight:  [61.2 kg] 61.2 kg (01/17  2223) Blood pressure (!) 170/93, pulse 70, temperature 98.3 F (36.8 C), temperature source Oral, resp. rate 18, height 5\' 7"  (1.702 m), weight 61.2 kg, SpO2 99%. Body mass index is 21.14 kg/m.  Physical Exam Constitutional:      General: She is in acute distress.  HENT:     Head: Normocephalic and atraumatic.     Nose: Nose normal.     Mouth/Throat:     Pharynx: Oropharynx is clear.  Eyes:     Pupils: Pupils are equal, round, and reactive to light.  Pulmonary:     Effort: Pulmonary effort is normal.  Musculoskeletal:        General: Normal range of motion.     Cervical back: Normal range of motion.  Skin:    General: Skin is dry.  Neurological:     General: No focal deficit present.     Mental Status: She is oriented to person, place, and time.  Psychiatric:        Attention and Perception: She is inattentive.        Mood and Affect: Mood is depressed. Affect is flat and inappropriate.        Speech: She is noncommunicative.        Behavior: Behavior is uncooperative and withdrawn.        Thought Content: Thought content includes suicidal ideation. Thought content includes suicidal plan.        Cognition and Memory: Cognition is impaired.        Judgment: Judgment is impulsive and inappropriate.     Mental Status Exam: General Appearance: Guarded  Orientation:  Full (Time, Place, and Person)  Memory:  Immediate;   Poor  Concentration:  Concentration: Poor and Attention Span: Poor  Recall:  Poor  Attention  Poor  Eye Contact:  None  Speech:  Blocked  Language:  Fair  Volume:  Decreased  Mood: sad/ depressed  Affect:  Depressed, Flat, and Restricted  Thought Process:  Disorganized  Thought Content:  NA  Suicidal Thoughts:  Yes.  with intent/plan  Homicidal Thoughts:  No  Judgement:  Impaired  Insight:  Shallow  Psychomotor Activity:  Decreased  Akathisia:  NA  Fund of Knowledge:  Poor      Assets:  Health and safety inspector Housing Physical  Health Social Support  Cognition: unable to assess  ADL's:  Intact  AIMS (if  indicated):        Other History   These have been pulled in through the EMR, reviewed, and updated if appropriate.  Family History:  The patient's family history includes Bipolar disorder in her brother; Cancer in her mother; Heart disease in her brother, father, mother, and sister.  Medical History: Past Medical History:  Diagnosis Date   Abnormal Pap smear of cervix 11/11/2015   Acute HFrEF (heart failure with reduced ejection fraction) (HCC) 02/22/2023   Acute ST elevation myocardial infarction (STEMI) of anterior wall (HCC) 02/15/2023   Anxiety    CVA (cerebral infarction)    Depressive disorder    GERD (gastroesophageal reflux disease)    Hyperlipidemia    Hypertension    IFG (impaired fasting glucose)    Osteoporosis    ST elevation myocardial infarction (STEMI) (HCC) 02/17/2023    Surgical History: Past Surgical History:  Procedure Laterality Date   APPENDECTOMY     CORONARY IMAGING/OCT N/A 02/21/2023   Procedure: CORONARY IMAGING/OCT;  Surgeon: Yvonne Kendall, MD;  Location: [facility];  Service: Cardiovascular;  Laterality: N/A;   CORONARY PRESSURE/FFR STUDY N/A 02/21/2023   Procedure: CORONARY PRESSURE/FFR STUDY;  Surgeon: Yvonne Kendall, MD;  Location: [facility];  Service: Cardiovascular;  Laterality: N/A;   CORONARY STENT INTERVENTION N/A 02/21/2023   Procedure: CORONARY STENT INTERVENTION;  Surgeon: Yvonne Kendall, MD;  Location: [facility];  Service: Cardiovascular;  Laterality: N/A;   CORONARY/GRAFT ACUTE MI REVASCULARIZATION N/A 02/15/2023   Procedure: Coronary/Graft Acute MI Revascularization;  Surgeon: Iran Ouch, MD;  Location: [facility];  Service: Cardiovascular;  Laterality: N/A;   LEFT HEART CATH AND CORONARY ANGIOGRAPHY N/A 02/15/2023   Procedure: LEFT HEART CATH AND CORONARY ANGIOGRAPHY;  Surgeon: Iran Ouch, MD;  Location:  [facility];  Service: Cardiovascular;  Laterality: N/A;   LEFT HEART CATH AND CORONARY ANGIOGRAPHY N/A 02/21/2023   Procedure: LEFT HEART CATH AND CORONARY ANGIOGRAPHY;  Surgeon: Yvonne Kendall, MD;  Location: [facility];  Service: Cardiovascular;  Laterality: N/A;   TONSILLECTOMY     TUBAL LIGATION       Medications:   Current Facility-Administered Medications:    aspirin chewable tablet 81 mg, 81 mg, Oral, Daily, Quale, Mark, MD   atorvastatin (LIPITOR) tablet 80 mg, 80 mg, Oral, Daily, Quale, Mark, MD   carvedilol (COREG) tablet 12.5 mg, 12.5 mg, Oral, BID WC, Quale, Mark, MD   losartan (COZAAR) tablet 50 mg, 50 mg, Oral, Daily, Quale, Mark, MD   sertraline (ZOLOFT) tablet 50 mg, 50 mg, Oral, Daily, Quale, Mark, MD   ticagrelor (BRILINTA) tablet 90 mg, 90 mg, Oral, BID, Sharyn Creamer, MD, 90 mg at 08/03/23 0014  Current Outpatient Medications:    amLODipine (NORVASC) 5 MG tablet, Take 1 tablet (5 mg total) by mouth at bedtime., Disp: 90 tablet, Rfl: 1   aspirin 81 MG chewable tablet, Chew 1 tablet (81 mg total) by mouth daily., Disp: 90 tablet, Rfl: 3   atorvastatin (LIPITOR) 80 MG tablet, Take 1 tablet (80 mg total) by mouth daily., Disp: 90 tablet, Rfl: 3   carvedilol (COREG) 12.5 MG tablet, Take 1 tablet (12.5 mg total) by mouth 2 (two) times daily with a meal., Disp: 180 tablet, Rfl: 3   losartan (COZAAR) 50 MG tablet, Take 1 tablet (50 mg total) by mouth daily., Disp: 90 tablet, Rfl: 1   nitroGLYCERIN (NITROSTAT) 0.4 MG SL tablet, Place 1 tablet (0.4 mg total) under  the tongue every 5 (five) minutes as needed for chest pain., Disp: 100 tablet, Rfl: 3   sertraline (ZOLOFT) 50 MG tablet, Take 1 tablet (50 mg total) by mouth daily., Disp: 90 tablet, Rfl: 1   ticagrelor (BRILINTA) 90 MG TABS tablet, Take 1 tablet (90 mg total) by mouth 2 (two) times daily., Disp: 180 tablet, Rfl: 1   traZODone (DESYREL) 50 MG tablet, Take 1 tablet (50 mg total) by mouth at bedtime as  needed for sleep., Disp: 30 tablet, Rfl: 3  Allergies: Allergies  Allergen Reactions   Citalopram Hydrobromide Nausea And Vomiting   Codeine Diarrhea and Nausea And Vomiting   Penicillin G Benzathine     Costas Sena Damaris Hippo, NP

## 2023-08-03 NOTE — Progress Notes (Signed)
Patient declined lunch tray

## 2023-08-03 NOTE — ED Notes (Signed)
Pt's husband Karina Freeman updated on plan of care as previously requested in chart. Pt gave verbal permission.

## 2023-08-03 NOTE — ED Notes (Signed)
Pt reluctantly agreeable to taking medication.  Pt repeatedly asking "what's this about?". Attempted to explain plan of care and that we are here to help pt, pt stating "no you're not, you're not helping me"

## 2023-08-03 NOTE — Plan of Care (Signed)
  Problem: Coping: Goal: Coping ability will improve Outcome: Not Progressing   Problem: Self-Concept: Goal: Ability to disclose and discuss suicidal ideas will improve Outcome: Not Progressing   Patient new to unit.

## 2023-08-03 NOTE — ED Notes (Signed)
Breakfast tray given. °

## 2023-08-03 NOTE — Tx Team (Signed)
 Initial Treatment Plan 08/03/2023 11:07 AM [name] [id number]    PATIENT STRESSORS: Marital or family conflict     PATIENT STRENGTHS: Capable of independent living  Supportive family/friends    PATIENT IDENTIFIED PROBLEMS: None identified by paitent                     DISCHARGE CRITERIA:  Ability to meet basic life and health needs Safe-care adequate arrangements made  PRELIMINARY DISCHARGE PLAN: Return to previous living arrangement  PATIENT/FAMILY INVOLVEMENT: This treatment plan has been presented to and reviewed with the patient, Karina Freeman.  The patient and family have been given the opportunity to ask questions and make suggestions.  Lenny Pastel, RN 08/03/2023, 11:07 AM
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
## 2023-08-03 NOTE — Progress Notes (Addendum)
 Admission Note:  Karina Freeman was admitted under IVC from the [facility] Emergency Department.  Patient was brought to the ER after her family found the patient with a wire hanger around her neck.   Patient had a prior suicide attempt in October.  Patient was irritated with admission process, but cooperative. One word answerers given for all questions.  Flat affect.  Denies SI/HI and AVH.  Denies feelings of anxiety and depression. Denies pain.  Patient stated she is mad at her husband, family and friends.  "Just wait until my husband gets here."    Patient lives in a mobile home with her husband. No history of falls.  Denies history of smoking or consumption of alcohol.  No assistive devices.  Patient is wearing glasses and has top dentures.    No belongings sent with patient. Per report, belongings sent home with husband.   Patient oriented to unit.  Questions answered. Lunch tray ordered.

## 2023-08-03 NOTE — ED Notes (Signed)
Pt ambulatory to restroom with encouragement

## 2023-08-03 NOTE — Group Note (Signed)
Date:  08/03/2023 Time:  10:33 AM  Group Topic/Focus:  Gratitude Group: The purpose of this group is to have patients work together on worksheets that ask what they are grateful for in their lives.      Participation Level:    Participation Quality:    Affect:    Cognitive:   Insight:   Engagement in Group:    Modes of Intervention:    Additional Comments:  Pt not on unit yet   Karina Freeman T Noe Gens 08/03/2023, 10:33 AM

## 2023-08-03 NOTE — ED Notes (Signed)
   Patient is to be admitted to [facility] by Psychiatric Nurse Practitioner Lerry Liner.  Attending Physician will be Dr. Marlou Porch.   Patient has been assigned to room L36, by [facility] Charge Nurse Talbert Forest.

## 2023-08-04 DIAGNOSIS — F332 Major depressive disorder, recurrent severe without psychotic features: Secondary | ICD-10-CM | POA: Diagnosis not present

## 2023-08-04 NOTE — Progress Notes (Signed)
 RN attempted again to complete EKG.   Patient states she doesn't understand why it is necessary.  RN explained it is routine and important  to have a baseline, especially if new medications will be started.  Patient appears confused and continues to refuse.

## 2023-08-04 NOTE — Group Note (Signed)
LCSW Group Therapy Note  Group Date: 08/04/2023 Start Time: 1400 End Time: 1445   Type of Therapy and Topic:  Group Therapy - Healthy vs Unhealthy Coping Skills  Participation Level:  minimal   Description of Group The focus of this group was to determine what unhealthy coping techniques typically are used by group members and what healthy coping techniques would be helpful in coping with various problems. Patients were guided in becoming aware of the differences between healthy and unhealthy coping techniques. Patients were asked to identify 2-3 healthy coping skills they would like to learn to use more effectively.  Therapeutic Goals Patients learned that coping is what human beings do all day long to deal with various situations in their lives Patients defined and discussed healthy vs unhealthy coping techniques Patients identified their preferred coping techniques and identified whether these were healthy or unhealthy Patients determined 2-3 healthy coping skills they would like to become more familiar with and use more often. Patients provided support and ideas to each other   Summary of Patient Progress:  During group, Dereka expressed minimal interest but was pleasant. Patient proved open to input from peers and feedback from CSW. Patient demonstrated minimal insight into the subject matter, was respectful of peers, and participated throughout the entire session.   Therapeutic Modalities Cognitive Behavioral Therapy Motivational Interviewing  Alanda Amass 08/04/2023  3:18 PM

## 2023-08-04 NOTE — Progress Notes (Signed)
   08/03/23 2000  Psych Admission Type (Psych Patients Only)  Admission Status Voluntary  Psychosocial Assessment  Patient Complaints Depression  Eye Contact Brief  Facial Expression Flat  Affect Flat  Speech Slow;Soft (whisper)  Interaction Guarded  Motor Activity Slow  Appearance/Hygiene In scrubs  Behavior Characteristics Guarded  Mood Depressed  Thought Process  Coherency WDL  Content WDL  Delusions None reported or observed  Perception WDL  Hallucination None reported or observed  Judgment Impaired  Confusion None  Danger to Self  Current suicidal ideation? Denies   Pt is alert and oriented. Pleasant on approach. Flat affect. Guarded. Limited interaction with peers and staff. Visit with spouse. Showered. Po med compliant. C/o restless leg. Tylenol 650mg  po given as ordered for pain/discomfort. Denies SI/HI/AVH.

## 2023-08-04 NOTE — Progress Notes (Signed)
 Patient unwilling to allow EKG at this time.  Education and encouragement provided by this Clinical research associate.  Will attempt again prior to end of shift.

## 2023-08-04 NOTE — Progress Notes (Signed)
   08/04/23 0615  15 Minute Checks  Location Dayroom  Visual Appearance Calm  Behavior Composed  Sleep (Behavioral Health Patients Only)  Calculate sleep? (Click Yes once per 24 hr at 0600 safety check) Yes  Documented sleep last 24 hours 7.25

## 2023-08-04 NOTE — Group Note (Signed)
Date:  08/04/2023 Time:  9:04 PM  Group Topic/Focus:  Wrap-Up Group:   The focus of this group is to help patients review their daily goal of treatment and discuss progress on daily workbooks.    Participation Level:  Did Not Attend  Lenore Cordia 08/04/2023, 9:04 PM
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
## 2023-08-04 NOTE — Plan of Care (Signed)
  Problem: Education: Goal: Ability to make informed decisions regarding treatment will improve Outcome: Progressing   Problem: Coping: Goal: Coping ability will improve Outcome: Progressing   Problem: Medication: Goal: Compliance with prescribed medication regimen will improve Outcome: Progressing   Problem: Self-Concept: Goal: Ability to disclose and discuss suicidal ideas will improve Outcome: Progressing

## 2023-08-04 NOTE — Group Note (Signed)
Date:  08/04/2023 Time:  5:24 PM  Group Topic/Focus:  Self Care:   The focus of this group is to help patients understand the importance of self-care in order to improve or restore emotional, physical, spiritual, interpersonal, and financial health.    Participation Level:  Active  Participation Quality:  Appropriate, Attentive, Sharing, and Supportive  Affect:  Appropriate  Cognitive:  Alert, Appropriate, and Oriented  Insight: Appropriate, Good, and Improving  Engagement in Group:  Engaged  Modes of Intervention:  Activity  Additional Comments:     Alexis Frock 08/04/2023, 5:24 PM

## 2023-08-04 NOTE — H&P (Signed)
Psychiatric Admission Assessment Adult  Patient Identification: Karina Freeman MRN:  [id number] Date of Evaluation:  08/04/2023 Chief Complaint:  MDD (major depressive disorder) [F32.9]   History of Present Illness: Patient is a 73 year old female presenting to [facility] ED under IVC. Per triage note Pt husband and daughter left pt at home to go run errands and found that pt right ear was bleeding when they returned home and found a wire clothes hanger on the bathroom floor.  Patient was evaluated in the emergency room and admitted to [facility] due to psych unit.  Patient was admitted to the unit in October 2020 for for depression and suicide attempt by overdose.  Today on interview patient reports that she does not know why she did it but keeps repeating "I wanted to be done with it, I could not do it ".  When provider asked her the reason for her suicide gesture patient is unable to describe her mood and the stressors.  When provider asked if she has been depressed she responds "I am not sure ".  She denies feeling hopeless or helpless, denies worthlessness, reports fair appetite and sleep.  When asked about anhedonia she reports she used to enjoy doing stepper at home.  Throughout the interview it is noted that the patient has some cognitive impairments as she is looking for words to describe her emotions and she was unable to talk about her feelings.  She reports she used to feel anxious.  When asked about panic attacks she could not give a proper answer and said she breathes heavily sometimes.  She denies having racing thoughts and is not displaying any grandiose delusions.  She denies having nightmares or flashbacks about any traumatic events.  When asked about her previous admission she stays "I got the gun but I am not sure why, I kept rotating it ".  When provider asked her the timeline of the incident with the gun she could not recall and she told the provider to talk to her husband.  When asked about her  family she said she lives with her husband and initially said no children.  When the provider reminded her of her daughter, she apologized and said yes I have 2 sons and a daughter.  When provider asked her about the ages of her children she smiled and said "they are older ,, as my husband ".  She continues to endorse statements "I want to be done with it "but unable to describe what it means to her.  She denies homicidal ideations.  She denies auditory/visual hallucinations.  Collaterals obtained from husband Karina Freeman [phone].  Husband reports that patient was doing well since her last discharge in October.  Has been informed the sequence of events that happened in her life in the last 1 year.  Patient went through heart attack in August and stents were put in her heart circulation.  After that patient started becoming depressed and had her suicidal attempt in October where she was admitted to [facility].  She was discharged on Zoloft and trazodone.  Husband reports the patient did very well until few weeks ago where he noticed that she is more withdrawn.  Husband reports that during Christmas time patient was getting more quiet but was still going to church with him, doing word puzzles.  In the last 2 weeks has been started noticing that patient is more withdrawn.  Husband reports that he kept checking on her and asked if she was feeling  suicidal but patient denied any thoughts at that time. Provider shared the concern about cognitive impairment and possible dementia.  Given the complications of recent heart attack onset of depression and cognitive impairment discussed doing some tests on her leg EKG and a possible CT scan head.  Has been agreed with the plan.   Total Time spent with patient: 1 hour  Past Psychiatric History:  Psychiatric History:  Information collected from patient  Prev Dx/Sx: Depression and anxiety Current Psych Provider: Unable to recall Home Meds (current): None  currently Previous Med Trials: Zoloft and trazodone Therapy: None currently  Prior Psych Hospitalization: Was recently discharged from [facility] in October Prior Self Harm: Yes did overdose on medicine Prior Violence: Denies  Family Psych History: Brother with bipolar disorder Family Hx suicide: Bio brother died by suicide  Social History:  Developmental Hx: Normal development Educational Hx: High school 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 Yes.    Has the patient been a risk to self in the past 6 months? Yes.    Has the patient been a risk to self within the distant past? Yes.    Is the patient a risk to others? No.  Has the patient been a risk to others in the past 6 months? No.  Has the patient been a risk to others within the distant past? No.   [place] Scale:  Flowsheet Row Admission (Current) from 08/03/2023 in [facility] BEHAVIORAL MEDICINE ED from 08/02/2023 in [facility] Emergency Department at [facility] Admission (Discharged) from 05/04/2023 in [facility] BE[facility]IORAL [facility]ICINE  C-SSRS RISK CATEGORY High Risk High Risk No Risk        Past Medical History:  Past Medical History:  Diagnosis Date   Abnormal Pap smear of cervix 11/11/2015   Acute HFrEF (heart failure with reduced ejection fraction) (HCC) 02/22/2023   Acute ST elevation myocardial infarction (STEMI) of anterior wall (HCC) 02/15/2023   Anxiety    CVA (cerebral infarction)     Depressive disorder    GERD (gastroesophageal reflux disease)    Hyperlipidemia    Hypertension    IFG (impaired fasting glucose)    Osteoporosis    ST elevation myocardial infarction (STEMI) (HCC) 02/17/2023    Past Surgical History:  Procedure Laterality Date   APPENDECTOMY     CORONARY IMAGING/OCT N/A 02/21/2023   Procedure: CORONARY IMAGING/OCT;  Surgeon: Yvonne Kendall, MD;  Location: [facility] INVASIVE CV LAB;  Service: Cardiovascular;  Laterality: N/A;   CORONARY PRESSURE/FFR STUDY N/A 02/21/2023   Procedure: CORONARY PRESSURE/FFR STUDY;  Surgeon: Yvonne Kendall, MD;  Location: [facility] INVASIVE CV LAB;  Service: Cardiovascular;  Laterality: N/A;   CORONARY STENT INTERVENTION N/A 02/21/2023   Procedure: CORONARY STENT INTERVENTION;  Surgeon: Yvonne Kendall, MD;  Location: [facility] INVASIVE CV LAB;  Service: Cardiovascular;  Laterality: N/A;   CORONARY/GRAFT ACUTE MI REVASCULARIZATION N/A 02/15/2023   Procedure: Coronary/Graft Acute MI Revascularization;  Surgeon: Iran Ouch, MD;  Location: [facility] INVASIVE CV LAB;  Service: Cardiovascular;  Laterality:  N/A;   LEFT HEART CATH AND CORONARY ANGIOGRAPHY N/A 02/15/2023   Procedure: LEFT HEART CATH AND CORONARY ANGIOGRAPHY;  Surgeon: Iran Ouch, MD;  Location: [facility] INVASIVE CV LAB;  Service: Cardiovascular;  Laterality: N/A;   LEFT HEART CATH AND CORONARY ANGIOGRAPHY N/A 02/21/2023   Procedure: LEFT HEART CATH AND CORONARY ANGIOGRAPHY;  Surgeon: Yvonne Kendall, MD;  Location: [facility] INVASIVE CV LAB;  Service: Cardiovascular;  Laterality: N/A;   TONSILLECTOMY     TUBAL LIGATION     Family History:  Family History  Problem Relation Age of Onset   Heart disease Mother    Cancer Mother        breast   Heart disease Father    Heart disease Brother    Bipolar disorder Brother    Heart disease Sister    Breast cancer Neg Hx     Social History:  Social History   Substance and Sexual Activity  Alcohol Use No   Alcohol/week: 0.0  standard drinks of alcohol     Social History   Substance and Sexual Activity  Drug Use No    Additional Social History: Marital status: Married Number of Years Married: 16 What types of issues is patient dealing with in the relationship?: No reported issues Additional relationship information: None Does patient have children?: Yes How many children?: 3 How is patient's relationship with their children?: Unknown                         Allergies:   Allergies  Allergen Reactions   Citalopram Hydrobromide Nausea And Vomiting   Codeine Diarrhea and Nausea And Vomiting   Penicillin G Benzathine    Lab Results:  Results for orders placed or performed during the hospital encounter of 08/02/23 (from the past 48 hours)  Comprehensive metabolic panel     Status: Abnormal   Collection Time: 08/02/23 10:25 PM  Result Value Ref Range   Sodium 139 135 - 145 mmol/L   Potassium 3.7 3.5 - 5.1 mmol/L   Chloride 104 98 - 111 mmol/L   CO2 24 22 - 32 mmol/L   Glucose, Bld 105 (H) 70 - 99 mg/dL    Comment: Glucose reference range applies only to samples taken after fasting for at least 8 hours.   BUN 16 8 - 23 mg/dL   Creatinine, Ser 5.27 0.44 - 1.00 mg/dL   Calcium 9.0 8.9 - 78.2 mg/dL   Total Protein 8.1 6.5 - 8.1 g/dL   Albumin 4.4 3.5 - 5.0 g/dL   AST 28 15 - 41 U/L   ALT 29 0 - 44 U/L   Alkaline Phosphatase 71 38 - 126 U/L   Total Bilirubin 1.2 0.0 - 1.2 mg/dL   GFR, Estimated >42 >35 mL/min    Comment: (NOTE) Calculated using the CKD-EPI Creatinine Equation (2021)    Anion gap 11 5 - 15    Comment: Performed at [facility], [address] [place], [place] [address]  Ethanol     Status: None   Collection Time: 08/02/23 10:25 PM  Result Value Ref Range   Alcohol, Ethyl (B) <10 <10 mg/dL    Comment: (NOTE) Lowest detectable limit for serum alcohol is 10 mg/dL.  For medical purposes only. Performed at [facility], [address],  [place], [place] [address]   Salicylate level     Status: Abnormal   Collection Time: 08/02/23 10:25 PM  Result Value Ref Range  Salicylate Lvl <7.0 (L) 7.0 - 30.0 mg/dL    Comment: Performed at [facility], [address] [place], [place] [address]  Acetaminophen level     Status: Abnormal   Collection Time: 08/02/23 10:25 PM  Result Value Ref Range   Acetaminophen (Tylenol), Serum <10 (L) 10 - 30 ug/mL    Comment: (NOTE) Therapeutic concentrations vary significantly. A range of 10-30 ug/mL  may be an effective concentration for many patients. However, some  are best treated at concentrations outside of this range. Acetaminophen concentrations >150 ug/mL at 4 hours after ingestion  and >50 ug/mL at 12 hours after ingestion are often associated with  toxic reactions.  Performed at [facility], [address] [place], [place] [address]   cbc     Status: Abnormal   Collection Time: 08/02/23 10:25 PM  Result Value Ref Range   WBC 12.4 (H) 4.0 - 10.5 K/uL   RBC 4.03 3.87 - 5.11 MIL/uL   Hemoglobin 12.7 12.0 - 15.0 g/dL   HCT 09.8 11.9 - 14.7 %   MCV 96.8 80.0 - 100.0 fL   MCH 31.5 26.0 - 34.0 pg   MCHC 32.6 30.0 - 36.0 g/dL   RDW 82.9 56.2 - 13.0 %   Platelets 268 150 - 400 K/uL   nRBC 0.0 0.0 - 0.2 %    Comment: Performed at [facility], [address], [place], [place] [address]  Resp panel by RT-PCR (RSV, Flu A&B, Covid) Anterior Nasal Swab     Status: None   Collection Time: 08/03/23  4:44 AM   Specimen: Anterior Nasal Swab  Result Value Ref Range   SARS Coronavirus 2 by RT PCR NEGATIVE NEGATIVE    Comment: (NOTE) SARS-CoV-2 target nucleic acids are NOT DETECTED.  The SARS-CoV-2 RNA is generally detectable in upper respiratory specimens during the acute phase of infection. The lowest concentration of SARS-CoV-2 viral copies this assay can detect is 138 copies/mL. A negative result does not preclude SARS-Cov-2 infection and should not be  used as the sole basis for treatment or other patient management decisions. A negative result may occur with  improper specimen collection/handling, submission of specimen other than nasopharyngeal swab, presence of viral mutation(s) within the areas targeted by this assay, and inadequate number of viral copies(<138 copies/mL). A negative result must be combined with clinical observations, patient history, and epidemiological information. The expected result is Negative.  Fact Sheet for Patients:  BloggerCourse.com  Fact Sheet for Healthcare Providers:  SeriousBroker.it  This test is no t yet approved or cleared by the [place] FDA and  has been authorized for detection and/or diagnosis of SARS-CoV-2 by FDA under an Emergency Use Authorization (EUA). This EUA will remain  in effect (meaning this test can be used) for the duration of the COVID-19 declaration under Section 564(b)(1) of the Act, 21 U.S.C.section 360bbb-3(b)(1), unless the authorization is terminated  or revoked sooner.       Influenza A by PCR NEGATIVE NEGATIVE   Influenza B by PCR NEGATIVE NEGATIVE    Comment: (NOTE) The Xpert Xpress SARS-CoV-2/FLU/RSV plus assay is intended as an aid in the diagnosis of influenza from Nasopharyngeal swab specimens and should not be used as a sole basis for treatment. Nasal washings and aspirates are unacceptable for Xpert Xpress SARS-CoV-2/FLU/RSV testing.  Fact Sheet for Patients: BloggerCourse.com  Fact Sheet for Healthcare Providers: SeriousBroker.it  This test is not yet approved or cleared by the [place] FDA and has been  authorized for detection and/or diagnosis of SARS-CoV-2 by FDA under an Emergency Use Authorization (EUA). This EUA will remain in effect (meaning this test can be used) for the duration of the COVID-19 declaration under Section 564(b)(1) of the  Act, 21 U.S.C. section 360bbb-3(b)(1), unless the authorization is terminated or revoked.     Resp Syncytial Virus by PCR NEGATIVE NEGATIVE    Comment: (NOTE) Fact Sheet for Patients: BloggerCourse.com  Fact Sheet for Healthcare Providers: SeriousBroker.it  This test is not yet approved or cleared by the [place] FDA and has been authorized for detection and/or diagnosis of SARS-CoV-2 by FDA under an Emergency Use Authorization (EUA). This EUA will remain in effect (meaning this test can be used) for the duration of the COVID-19 declaration under Section 564(b)(1) of the Act, 21 U.S.C. section 360bbb-3(b)(1), unless the authorization is terminated or revoked.  Performed at [facility], [address] [place], [place] [address]   Urine Drug Screen, Qualitative     Status: Abnormal   Collection Time: 08/03/23  8:06 AM  Result Value Ref Range   Tricyclic, Ur Screen NONE DETECTED NONE DETECTED   Amphetamines, Ur Screen NONE DETECTED NONE DETECTED   MDMA (Ecstasy)Ur Screen NONE DETECTED NONE DETECTED   Cocaine Metabolite,Ur [place] NONE DETECTED NONE DETECTED   Opiate, Ur Screen NONE DETECTED NONE DETECTED   Phencyclidine (PCP) Ur S NONE DETECTED NONE DETECTED   Cannabinoid 50 Ng, Ur  POSITIVE (A) NONE DETECTED   Barbiturates, Ur Screen NONE DETECTED NONE DETECTED   Benzodiazepine, Ur Scrn NONE DETECTED NONE DETECTED   Methadone Scn, Ur NONE DETECTED NONE DETECTED    Comment: (NOTE) Tricyclics + metabolites, urine    Cutoff 1000 ng/mL Amphetamines + metabolites, urine  Cutoff 1000 ng/mL MDMA (Ecstasy), urine              Cutoff 500 ng/mL Cocaine Metabolite, urine          Cutoff 300 ng/mL Opiate + metabolites, urine        Cutoff 300 ng/mL Phencyclidine (PCP), urine         Cutoff 25 ng/mL Cannabinoid, urine                 Cutoff 50 ng/mL Barbiturates + metabolites, urine  Cutoff 200 ng/mL Benzodiazepine, urine               Cutoff 200 ng/mL Methadone, urine                   Cutoff 300 ng/mL  The urine drug screen provides only a preliminary, unconfirmed analytical test result and should not be used for non-medical purposes. Clinical consideration and professional judgment should be applied to any positive drug screen result due to possible interfering substances. A more specific alternate chemical method must be used in order to obtain a confirmed analytical result. Gas chromatography / mass spectrometry (GC/MS) is the preferred confirm atory method. Performed at [facility], [address] [place], [place] [address]   Urinalysis, Routine w reflex microscopic -Urine, Clean Catch     Status: Abnormal   Collection Time: 08/03/23  8:06 AM  Result Value Ref Range   Color, Urine YELLOW (A) YELLOW   APPearance HAZY (A) CLEAR   Specific Gravity, Urine 1.024 1.005 - 1.030   pH 5.0 5.0 - 8.0   Glucose, UA NEGATIVE NEGATIVE mg/dL   Hgb urine dipstick NEGATIVE NEGATIVE   Bilirubin Urine NEGATIVE NEGATIVE   Ketones, ur NEGATIVE NEGATIVE  mg/dL   Protein, ur 30 (A) NEGATIVE mg/dL   Nitrite NEGATIVE NEGATIVE   Leukocytes,Ua TRACE (A) NEGATIVE   RBC / HPF 6-10 0 - 5 RBC/hpf   WBC, UA 11-20 0 - 5 WBC/hpf   Bacteria, UA FEW (A) NONE SEEN   Squamous Epithelial / HPF 11-20 0 - 5 /HPF   Mucus PRESENT     Comment: Performed at [facility], [address] [place], [place] [address]    Blood Alcohol level:  Lab Results  Component Value Date   [facility] <10 08/02/2023   ETH <10 05/03/2023    Metabolic Disorder Labs:  Lab Results  Component Value Date   HGBA1C 5.4 02/15/2023   MPG 108.28 02/15/2023   No results found for: "PROLACTIN" Lab Results  Component Value Date   CHOL 125 03/14/2023   TRIG 86 03/14/2023   HDL 52 03/14/2023   CHOLHDL 2.4 03/14/2023   VLDL 17 02/15/2023   LDLCALC 56 03/14/2023   LDLCALC 164 (H) 02/15/2023    Current Medications: Current  Facility-Administered Medications  Medication Dose Route Frequency Provider Last Rate Last Admin   acetaminophen (TYLENOL) tablet 650 mg  650 mg Oral Q6H PRN Jearld Lesch, NP   650 mg at 08/04/23 0148   alum & mag hydroxide-simeth (MAALOX/MYLANTA) 200-200-20 MG/5ML suspension 30 mL  30 mL Oral Q4H PRN Jearld Lesch, NP       aspirin chewable tablet 81 mg  81 mg Oral Daily Dixon, Rashaun M, NP   81 mg at 08/04/23 0925   atorvastatin (LIPITOR) tablet 80 mg  80 mg Oral Daily Lerry Liner M, NP   80 mg at 08/04/23 0981   carvedilol (COREG) tablet 12.5 mg  12.5 mg Oral BID WC Dixon, Rashaun M, NP   12.5 mg at 08/04/23 0754   haloperidol (HALDOL) tablet 5 mg  5 mg Oral TID PRN Jearld Lesch, NP       And   diphenhydrAMINE (BENADRYL) capsule 50 mg  50 mg Oral TID PRN Jearld Lesch, NP       haloperidol lactate (HALDOL) injection 5 mg  5 mg Intramuscular TID PRN Jearld Lesch, NP       And   diphenhydrAMINE (BENADRYL) injection 50 mg  50 mg Intramuscular TID PRN Jearld Lesch, NP       And   LORazepam (ATIVAN) injection 2 mg  2 mg Intramuscular TID PRN Jearld Lesch, NP       haloperidol lactate (HALDOL) injection 10 mg  10 mg Intramuscular TID PRN Jearld Lesch, NP       And   diphenhydrAMINE (BENADRYL) injection 50 mg  50 mg Intramuscular TID PRN Jearld Lesch, NP       And   LORazepam (ATIVAN) injection 2 mg  2 mg Intramuscular TID PRN Jearld Lesch, NP       hydrOXYzine (ATARAX) tablet 25 mg  25 mg Oral TID PRN Jearld Lesch, NP       losartan (COZAAR) tablet 50 mg  50 mg Oral Daily Dixon, Rashaun M, NP   50 mg at 08/04/23 0925   magnesium hydroxide (MILK OF MAGNESIA) suspension 30 mL  30 mL Oral Daily PRN Jearld Lesch, NP       sertraline (ZOLOFT) tablet 50 mg  50 mg Oral Daily Dixon, Rashaun M, NP   50 mg at 08/04/23 0925   ticagrelor (BRILINTA) tablet 90 mg  90 mg Oral  BID Lerry Liner M, NP   90 mg at 08/04/23 4540   traZODone (DESYREL) tablet 50 mg   50 mg Oral QHS PRN Jearld Lesch, NP   50 mg at 08/03/23 2127   PTA Medications: Medications Prior to Admission  Medication Sig Dispense Refill Last Dose/Taking   amLODipine (NORVASC) 5 MG tablet Take 1 tablet (5 mg total) by mouth at bedtime. 90 tablet 1    aspirin 81 MG chewable tablet Chew 1 tablet (81 mg total) by mouth daily. 90 tablet 3    atorvastatin (LIPITOR) 80 MG tablet Take 1 tablet (80 mg total) by mouth daily. 90 tablet 3    carvedilol (COREG) 12.5 MG tablet Take 1 tablet (12.5 mg total) by mouth 2 (two) times daily with a meal. 180 tablet 3    losartan (COZAAR) 50 MG tablet Take 1 tablet (50 mg total) by mouth daily. 90 tablet 1    nitroGLYCERIN (NITROSTAT) 0.4 MG SL tablet Place 1 tablet (0.4 mg total) under the tongue every 5 (five) minutes as needed for chest pain. 100 tablet 3    sertraline (ZOLOFT) 50 MG tablet Take 1 tablet (50 mg total) by mouth daily. 90 tablet 1    ticagrelor (BRILINTA) 90 MG TABS tablet Take 1 tablet (90 mg total) by mouth 2 (two) times daily. 180 tablet 1    traZODone (DESYREL) 50 MG tablet Take 1 tablet (50 mg total) by mouth at bedtime as needed for sleep. 30 tablet 3     Psychiatric Specialty Exam:  Presentation  General Appearance:  Appropriate for Environment  Eye Contact: Fleeting  Speech: Slow  Speech Volume: Decreased  Handedness: Right   Mood and Affect  Mood: Depressed; Anxious  Affect: Flat; Depressed   Thought Process  Thought Processes: Disorganized  Descriptions of Associations:Tangential  Orientation:Partial  Thought Content:Illogical; Perseveration  Hallucinations:Hallucinations: None  Ideas of Reference:None  Suicidal Thoughts:Suicidal Thoughts: Yes, Passive SI Passive Intent and/or Plan: Without Intent; Without Plan  Homicidal Thoughts:No data recorded  Sensorium  Memory: Immediate Poor; Recent Poor; Remote Poor  Judgment: Impaired  Insight: Shallow   Executive Functions   Concentration: Poor  Attention Span: Poor  Recall: Poor  Fund of Knowledge: Fair  Language: Fair   Psychomotor Activity  Psychomotor Activity: Psychomotor Activity: Normal   Assets  Assets: Desire for Improvement; Resilience; Social Support   Sleep  Sleep: Sleep: Fair  Musculoskeletal: Strength & Muscle Tone: within normal limits Gait & Station: normal  Physical Exam: Physical Exam Vitals and nursing note reviewed.  HENT:     Head: Normocephalic.     Nose: Nose normal.     Mouth/Throat:     Mouth: Mucous membranes are moist.  Eyes:     Pupils: Pupils are equal, round, and reactive to light.  Cardiovascular:     Rate and Rhythm: Normal rate.     Pulses: Normal pulses.  Pulmonary:     Effort: Pulmonary effort is normal.     Breath sounds: Normal breath sounds.  Abdominal:     General: Bowel sounds are normal.  Skin:    General: Skin is warm.  Neurological:     General: No focal deficit present.     Mental Status: She is alert.    Review of Systems  Constitutional: Negative.   HENT: Negative.    Eyes: Negative.   Respiratory: Negative.    Cardiovascular: Negative.   Gastrointestinal: Negative.   Musculoskeletal: Negative.   Skin: Negative.   Neurological: Negative.  Blood pressure 123/74, pulse 73, temperature 97.8 F (36.6 C), resp. rate 17, height 5\' 7"  (1.702 m), weight 64.6 kg, SpO2 97%. Body mass index is 22.32 kg/m.  Principal Diagnosis: MDD (major depressive disorder) Diagnosis:  Principal Problem:   MDD (major depressive disorder)  Clinical Decision Making: Patient is a 73 year old female with history of depression admitted to the inpatient unit after a suicide attempt of trying to strangulate herself with a cord around her neck.  Patient displays significant symptoms of dementia/confusion and is a poor historian.  She had previous suicide attempts and is unable to identify her triggers.  She remains a chronic moderate risk for  suicide.  Treatment Plan Summary: Daily contact with patient to assess and evaluate symptoms and progress in treatment Medication Management: Patient was started on Zoloft 50 mg daily and trazodone 50 mg nightly as needed in the emergency room.  Will continue that Informed consent: Patient gave consent to the above medications Side effects to medications: Discussed all the side effects to the medications and patient acknowledged the side effects and consent Recommend to participate in the group and milieu therapy Patient is on every 15 checks Observation Level/Precautions:  15 minute checks  Laboratory:  CBC HbAIC UDS  Psychotherapy:    Medications:    Consultations:    Discharge Concerns:    Estimated LOS:  Other:     Physician Treatment Plan for Primary Diagnosis: MDD (major depressive disorder) Long Term Goal(s): Improvement in symptoms so as ready for discharge  Short Term Goals: Ability to disclose and discuss suicidal ideas, Ability to identify and develop effective coping behaviors will improve, and Compliance with prescribed medications will improve  Physician Treatment Plan for Secondary Diagnosis: Principal Problem:   MDD (major depressive disorder)  Long Term Goal(s): Improvement in symptoms so as ready for discharge  Short Term Goals: Ability to verbalize feelings will improve, Ability to disclose and discuss suicidal ideas, Ability to demonstrate self-control will improve, Ability to identify and develop effective coping behaviors will improve, Ability to maintain clinical measurements within normal limits will improve, Compliance with prescribed medications will improve, and Ability to identify triggers associated with substance abuse/mental health issues will improve  I certify that inpatient services furnished can reasonably be expected to improve the patient's condition.    Verner Chol, MD 1/19/20251:51 PM

## 2023-08-04 NOTE — Progress Notes (Signed)
 Patient pleasant and cooperative. Flat affect.  Denies SI/HI and AVH.  Denies anxiety.  States she is unsure if she is sad/ depressed.  Denies pain.  Reports she slept well.   Compliant with scheduled mediations.  15 min checks in place for safety.  Patient is present in the milieu.  Minimal interaction with peers and staff.  Patient present in the dayroom for groups and meals.

## 2023-08-04 NOTE — B[facility] Suicide Risk Assessment (Signed)
[facility] Admission Suicide Risk Assessment   Nursing information obtained from:  Patient Demographic factors:  Age 73 or older, Caucasian Current Mental Status:  NA Loss Factors:  NA Historical Factors:  Prior suicide attempts Risk Reduction Factors:  Living with another person, especially a relative  Total Time spent with patient: 30 minutes Principal Problem: MDD (major depressive disorder) Diagnosis:  Principal Problem:   MDD (major depressive disorder)  Subjective Data: Patient is admitted to the geropsych unit for trying to put a wire around her neck as an attempt of suicide.  Patient is noted to be confused and display symptoms of dementia.  Continued Clinical Symptoms:  Alcohol Use Disorder Identification Test Final Score (AUDIT): 0 The "Alcohol Use Disorders Identification Test", Guidelines for Use in Primary Care, Second Edition.  World Science writer [facility]). Score between 0-7:  no or low risk or alcohol related problems. Score between 8-15:  moderate risk of alcohol related problems. Score between 16-19:  high risk of alcohol related problems. Score 20 or above:  warrants further diagnostic evaluation for alcohol dependence and treatment.   CLINICAL FACTORS:   Previous Psychiatric Diagnoses and Treatments   Musculoskeletal: Strength & Muscle Tone: within normal limits Gait & Station: normal Patient leans: N/A  Psychiatric Specialty Exam:  Presentation  General Appearance:  Appropriate for Environment  Eye Contact: Fair  Speech: Slow; Other (comment) (Scanned)  Speech Volume: Decreased  Handedness: Right   Mood and Affect  Mood: -- (Fine)  Affect: Constricted   Thought Process  Thought Processes: Disorganized (Slowed, blocked)  Descriptions of Associations:Tangential  Orientation:Full (Time, Place and Person)  Thought Content:Scattered  History of Schizophrenia/Schizoaffective disorder:No  Hallucinations: Denies hallucinations Ideas  of Reference:None  Suicidal Thoughts: Continues to endorse passive suicidal thoughts Homicidal Thoughts: None reported  Sensorium  Memory: Other (comment) (Impaired)  Judgment: Impaired  Insight: Impaired   Executive Functions  Concentration: Poor  Attention Span: Poor  Recall: Poor  Fund of Knowledge: Fair  Language: Fair   Psychomotor Activity  Psychomotor Activity: Psychomotor retardation  Assets  Assets: Desire for Improvement; Social Support; Manufacturing systems engineer; Housing   Sleep  Sleep: Fair   Physical Exam: Physical Exam ROS Blood pressure 123/74, pulse 73, temperature 97.8 F (36.6 C), resp. rate 17, height 5\' 7"  (1.702 m), weight 64.6 kg, SpO2 97%. Body mass index is 22.32 kg/m.   COGNITIVE FEATURES THAT CONTRIBUTE TO RISK:  Loss of executive function    SUICIDE RISK:   Moderate:  Frequent suicidal ideation with limited intensity, and duration, some specificity in terms of plans, no associated intent, good self-control, limited dysphoria/symptomatology, some risk factors present, and identifiable protective factors, including available and accessible social support.  PLAN OF CARE: Continue inpatient hospitalization, provide every 15 minute safety checks, provide medication management, work with the multidisciplinary team for safe for discharge planning  I certify that inpatient services furnished can reasonably be expected to improve the patient's condition.   Verner Chol, MD 08/04/2023, 1:46 PM

## 2023-08-04 NOTE — Group Note (Signed)
Date:  08/04/2023 Time:  3:19 PM  Group Topic/Focus:  Movement therapy    Participation Level:  Did Not Attend    Rodena Goldmann 08/04/2023, 3:19 PM

## 2023-08-04 NOTE — Plan of Care (Signed)
  Problem: Coping: Goal: Coping ability will improve Outcome: Progressing   Problem: Medication: Goal: Compliance with prescribed medication regimen will improve Outcome: Progressing   

## 2023-08-04 NOTE — Progress Notes (Signed)
Patient refused vital signs. RN explained purpose of the vitals signs, and explained that vitals signs are taken once per shifts and sometimes as needed for baseline and changes. Patient still refused.

## 2023-08-05 DIAGNOSIS — F332 Major depressive disorder, recurrent severe without psychotic features: Secondary | ICD-10-CM | POA: Diagnosis not present

## 2023-08-05 MED ORDER — ENSURE ENLIVE PO LIQD
237.0000 mL | Freq: Two times a day (BID) | ORAL | Status: DC
  Administered 2023-08-06 – 2023-08-28 (×29): 237 mL via ORAL

## 2023-08-05 MED ORDER — ADULT MULTIVITAMIN W/MINERALS CH
1.0000 | ORAL_TABLET | Freq: Every day | ORAL | Status: DC
  Administered 2023-08-05 – 2023-08-28 (×24): 1 via ORAL
  Filled 2023-08-05 (×23): qty 1

## 2023-08-05 MED ORDER — SULFAMETHOXAZOLE-TRIMETHOPRIM 800-160 MG PO TABS
1.0000 | ORAL_TABLET | Freq: Two times a day (BID) | ORAL | Status: AC
  Administered 2023-08-05 – 2023-08-08 (×6): 1 via ORAL
  Filled 2023-08-05 (×6): qty 1

## 2023-08-05 MED ORDER — SERTRALINE HCL 50 MG PO TABS
75.0000 mg | ORAL_TABLET | Freq: Every day | ORAL | Status: DC
  Administered 2023-08-06 – 2023-08-10 (×5): 75 mg via ORAL
  Filled 2023-08-05 (×5): qty 1

## 2023-08-05 NOTE — Progress Notes (Signed)
NUTRITION ASSESSMENT  Pt identified as at risk on the Malnutrition Screen Tool  INTERVENTION:  -Continue regular diet -Ensure Enlive po BID, each supplement provides 350 kcal and 20 grams of protein.  -MVI with minerals daily   NUTRITION DIAGNOSIS: Unintentional weight loss related to sub-optimal intake as evidenced by pt report.   Goal: Pt to meet >/= 90% of their estimated nutrition needs.  Monitor:  PO intake  Assessment:  Pt admitted under IVC. She has depression and suicide attempt by overdose.   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 Family also noticed pt becoming more quiet and withdrawn since Christmas especially over the past 2 weeks.  Per MD notes, concern for depression, cognitive impairment, and dementia.    Pt reports poor appetite. 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   Reviewed wt hx; pt has experienced a 7.7% wt loss over the past 3 months, which is significant for time frame. Pt is at high risk for malnutrition, however, unable to identify at this time. Pt would greatly benefit from addition of oral nutrition supplements.   Pt has been participating in group sessions.   Labs reviewed.   73 y.o. female  Height: Ht Readings from Last 1 Encounters:  08/03/23 5\' 7"  (1.702 m)    Weight: Wt Readings from Last 1 Encounters:  08/03/23 64.6 kg    Weight Hx: Wt Readings from Last 10 Encounters:  08/03/23 64.6 kg  08/02/23 61.2 kg  05/28/23 69.4 kg  05/03/23 70 kg  04/04/23 70 kg  03/26/23 71.7 kg  03/07/23 73.5 kg  02/21/23 73.9 kg  02/15/23 74.1 kg  06/25/16 82.3 kg    BMI:  Body mass index is 22.32 kg/m. BMI WDL.   Estimated Nutritional Needs: Kcal: 25-30 kcal/kg Protein: > 1 gram protein/kg Fluid: 1 ml/kcal  Diet Order:  Diet Order             Diet regular Room service appropriate? Yes; Fluid consistency: Thin  Diet effective  now                  Pt is also offered choice of unit snacks mid-morning and mid-afternoon.  Pt is eating as desired.   Lab results and medications reviewed.   Levada Schilling, RD, LDN, CDCES Registered Dietitian III Certified Diabetes Care and Education Specialist If unable to reach this RD, please use "RD Inpatient" group chat on secure chat between hours of 8am-4 pm daily

## 2023-08-05 NOTE — Group Note (Signed)
Recreation Therapy Group Note   Group Topic:Communication  Group Date: 08/05/2023 Start Time: 1500 End Time: 1550 Facilitators: Rosina Lowenstein, LRT, CTRS Location:  Dayroom  Group Description: Emotional Check in. Patient sat and talked with LRT about how they are doing and whatever else is on their mind. LRT provided active listening, reassurance and encouragement. Pts were given the opportunity to listen to music or color mandalas while they talk.    Goal Area(s) Addressed: Patient will engage in conversation with LRT. Patient will communicate their wants, needs, or questions.  Patient will practice a new coping skill of "talking to someone".   Affect/Mood: Flat   Participation Level: Minimal   Participation Quality: Independent   Behavior: Disinterested and Guarded   Speech/Thought Process: Delusional   Insight: Limited   Judgement: Limited   Modes of Intervention: Open Conversation, Rapport Building, Socialization, and Support   Patient Response to Interventions:  Disengaged   Education Outcome:  In group clarification offered    Clinical Observations/Individualized Feedback: Chastelyn was minimally active in their participation of session activities and group discussion. Pt identified "what year is it?". Pt minimally interacted with LRT and peers, however, was observed coloring appropriately.    Plan: Continue to engage patient in RT group sessions 2-3x/week.   Rosina Lowenstein, LRT, CTRS 08/05/2023 4:52 PM

## 2023-08-05 NOTE — Progress Notes (Signed)
   08/04/23 2300  Psych Admission Type (Psych Patients Only)  Admission Status Involuntary  Psychosocial Assessment  Patient Complaints Crying spells;Sadness  Eye Contact Fair  Facial Expression Flat  Affect Flat  Speech Logical/coherent  Interaction Isolative;Guarded  Motor Activity Slow  Appearance/Hygiene Unremarkable  Behavior Characteristics Cooperative;Appropriate to situation  Mood Depressed  Thought Process  Coherency WDL  Content WDL  Delusions None reported or observed  Perception WDL  Hallucination None reported or observed  Judgment Impaired  Confusion None  Danger to Self  Current suicidal ideation? Denies  Danger to Others  Danger to Others None reported or observed

## 2023-08-05 NOTE — Progress Notes (Signed)
   08/05/23 0600  15 Minute Checks  Location Bedroom  Visual Appearance Calm  Behavior Sleeping  Sleep (Behavioral Health Patients Only)  Calculate sleep? (Click Yes once per 24 hr at 0600 safety check) Yes  Documented sleep last 24 hours 7.75

## 2023-08-05 NOTE — Progress Notes (Signed)
   08/05/23 2300  Psych Admission Type (Psych Patients Only)  Admission Status Involuntary  Psychosocial Assessment  Patient Complaints Irritability;Depression  Eye Contact Fair  Facial Expression Flat  Affect Flat  Speech Soft  Interaction Assertive  Motor Activity Slow  Appearance/Hygiene Unremarkable  Behavior Characteristics Cooperative;Appropriate to situation  Mood Depressed  Thought Process  Coherency WDL  Content WDL  Delusions None reported or observed  Perception WDL  Hallucination None reported or observed  Judgment Impaired  Confusion None  Danger to Self  Current suicidal ideation? Denies  Danger to Others  Danger to Others None reported or observed

## 2023-08-05 NOTE — Plan of Care (Signed)
  Problem: Education: Goal: Ability to make informed decisions regarding treatment will improve Outcome: Not Progressing   Problem: Coping: Goal: Coping ability will improve Outcome: Not Progressing   

## 2023-08-05 NOTE — Progress Notes (Signed)
   08/05/23 1100  Psych Admission Type (Psych Patients Only)  Admission Status Involuntary  Psychosocial Assessment  Patient Complaints Depression  Eye Contact Fair  Facial Expression Flat  Affect Flat  Speech Logical/coherent  Interaction Isolative;Guarded  Motor Activity Slow  Appearance/Hygiene Unremarkable  Behavior Characteristics Cooperative  Mood Depressed  Thought Process  Coherency WDL  Content WDL  Delusions None reported or observed  Perception WDL  Hallucination None reported or observed  Judgment Impaired  Confusion None  Danger to Self  Current suicidal ideation? Denies  Danger to Others  Danger to Others None reported or observed

## 2023-08-05 NOTE — BH IP Treatment Plan (Signed)
Interdisciplinary Treatment and Diagnostic Plan Update  08/05/2023 Time of Session: 3:10 AM  Karina Freeman MRN: [id number]  Principal Diagnosis: MDD (major depressive disorder)  Secondary Diagnoses: Principal Problem:   MDD (major depressive disorder)   Current Medications:  Current Facility-Administered Medications  Medication Dose Route Frequency Provider Last Rate Last Admin   acetaminophen (TYLENOL) tablet 650 mg  650 mg Oral Q6H PRN Jearld Lesch, NP   650 mg at 08/04/23 0148   alum & mag hydroxide-simeth (MAALOX/MYLANTA) 200-200-20 MG/5ML suspension 30 mL  30 mL Oral Q4H PRN Jearld Lesch, NP       aspirin chewable tablet 81 mg  81 mg Oral Daily Dixon, Rashaun M, NP   81 mg at 08/05/23 0272   atorvastatin (LIPITOR) tablet 80 mg  80 mg Oral Daily Lerry Liner M, NP   80 mg at 08/05/23 5366   carvedilol (COREG) tablet 12.5 mg  12.5 mg Oral BID WC Dixon, Rashaun M, NP   12.5 mg at 08/05/23 4403   haloperidol (HALDOL) tablet 5 mg  5 mg Oral TID PRN Jearld Lesch, NP       And   diphenhydrAMINE (BENADRYL) capsule 50 mg  50 mg Oral TID PRN Jearld Lesch, NP       haloperidol lactate (HALDOL) injection 5 mg  5 mg Intramuscular TID PRN Jearld Lesch, NP       And   diphenhydrAMINE (BENADRYL) injection 50 mg  50 mg Intramuscular TID PRN Jearld Lesch, NP       And   LORazepam (ATIVAN) injection 2 mg  2 mg Intramuscular TID PRN Jearld Lesch, NP       haloperidol lactate (HALDOL) injection 10 mg  10 mg Intramuscular TID PRN Jearld Lesch, NP       And   diphenhydrAMINE (BENADRYL) injection 50 mg  50 mg Intramuscular TID PRN Jearld Lesch, NP       And   LORazepam (ATIVAN) injection 2 mg  2 mg Intramuscular TID PRN Dixon, Rashaun M, NP       feeding supplement (ENSURE ENLIVE / ENSURE PLUS) liquid 237 mL  237 mL Oral BID BM Lewanda Rife, MD       hydrOXYzine (ATARAX) tablet 25 mg  25 mg Oral TID PRN Jearld Lesch, NP       losartan (COZAAR) tablet 50  mg  50 mg Oral Daily Dixon, Rashaun M, NP   50 mg at 08/05/23 4742   magnesium hydroxide (MILK OF MAGNESIA) suspension 30 mL  30 mL Oral Daily PRN Jearld Lesch, NP       multivitamin with minerals tablet 1 tablet  1 tablet Oral Daily Lewanda Rife, MD   1 tablet at 08/05/23 1000   [START ON 08/06/2023] sertraline (ZOLOFT) tablet 75 mg  75 mg Oral Daily Massengill, Nathan, MD       sulfamethoxazole-trimethoprim (BACTRIM DS) 800-160 MG per tablet 1 tablet  1 tablet Oral Q12H Massengill, Nathan, MD       ticagrelor (BRILINTA) tablet 90 mg  90 mg Oral BID Lerry Liner M, NP   90 mg at 08/05/23 5956   traZODone (DESYREL) tablet 50 mg  50 mg Oral QHS PRN Jearld Lesch, NP   50 mg at 08/03/23 2127   PTA Medications: Medications Prior to Admission  Medication Sig Dispense Refill Last Dose/Taking   amLODipine (NORVASC) 5 MG tablet Take 1 tablet (5 mg total) by mouth at  bedtime. 90 tablet 1    aspirin 81 MG chewable tablet Chew 1 tablet (81 mg total) by mouth daily. 90 tablet 3    atorvastatin (LIPITOR) 80 MG tablet Take 1 tablet (80 mg total) by mouth daily. 90 tablet 3    carvedilol (COREG) 12.5 MG tablet Take 1 tablet (12.5 mg total) by mouth 2 (two) times daily with a meal. 180 tablet 3    losartan (COZAAR) 50 MG tablet Take 1 tablet (50 mg total) by mouth daily. 90 tablet 1    nitroGLYCERIN (NITROSTAT) 0.4 MG SL tablet Place 1 tablet (0.4 mg total) under the tongue every 5 (five) minutes as needed for chest pain. 100 tablet 3    sertraline (ZOLOFT) 50 MG tablet Take 1 tablet (50 mg total) by mouth daily. 90 tablet 1    ticagrelor (BRILINTA) 90 MG TABS tablet Take 1 tablet (90 mg total) by mouth 2 (two) times daily. 180 tablet 1    traZODone (DESYREL) 50 MG tablet Take 1 tablet (50 mg total) by mouth at bedtime as needed for sleep. 30 tablet 3     Patient Stressors: Marital or family conflict    Patient Strengths: Capable of independent living  Supportive family/friends   Treatment  Modalities: Medication Management, Group therapy, Case management,  1 to 1 session with clinician, Psychoeducation, Recreational therapy.   Physician Treatment Plan for Primary Diagnosis: MDD (major depressive disorder) Long Term Goal(s): Improvement in symptoms so as ready for discharge   Short Term Goals: Ability to verbalize feelings will improve Ability to disclose and discuss suicidal ideas Ability to demonstrate self-control will improve Ability to identify and develop effective coping behaviors will improve Ability to maintain clinical measurements within normal limits will improve Compliance with prescribed medications will improve Ability to identify triggers associated with substance abuse/mental health issues will improve  Medication Management: Evaluate patient's response, side effects, and tolerance of medication regimen.  Therapeutic Interventions: 1 to 1 sessions, Unit Group sessions and Medication administration.  Evaluation of Outcomes: Progressing  Physician Treatment Plan for Secondary Diagnosis: Principal Problem:   MDD (major depressive disorder)  Long Term Goal(s): Improvement in symptoms so as ready for discharge   Short Term Goals: Ability to verbalize feelings will improve Ability to disclose and discuss suicidal ideas Ability to demonstrate self-control will improve Ability to identify and develop effective coping behaviors will improve Ability to maintain clinical measurements within normal limits will improve Compliance with prescribed medications will improve Ability to identify triggers associated with substance abuse/mental health issues will improve     Medication Management: Evaluate patient's response, side effects, and tolerance of medication regimen.  Therapeutic Interventions: 1 to 1 sessions, Unit Group sessions and Medication administration.  Evaluation of Outcomes: Not Progressing   RN Treatment Plan for Primary Diagnosis: MDD (major  depressive disorder) Long Term Goal(s): Knowledge of disease and therapeutic regimen to maintain health will improve  Short Term Goals: Ability to remain free from injury will improve, Ability to verbalize frustration and anger appropriately will improve, Ability to demonstrate self-control, Ability to participate in decision making will improve, Ability to verbalize feelings will improve, Ability to disclose and discuss suicidal ideas, Ability to identify and develop effective coping behaviors will improve, and Compliance with prescribed medications will improve  Medication Management: RN will administer medications as ordered by provider, will assess and evaluate patient's response and provide education to patient for prescribed medication. RN will report any adverse and/or side effects to prescribing provider.  Therapeutic  Interventions: 1 on 1 counseling sessions, Psychoeducation, Medication administration, Evaluate responses to treatment, Monitor vital signs and CBGs as ordered, Perform/monitor CIWA, COWS, AIMS and Fall Risk screenings as ordered, Perform wound care treatments as ordered.  Evaluation of Outcomes: Not Progressing   LCSW Treatment Plan for Primary Diagnosis: MDD (major depressive disorder) Long Term Goal(s): Safe transition to appropriate next level of care at discharge, Engage patient in therapeutic group addressing interpersonal concerns.  Short Term Goals: Engage patient in aftercare planning with referrals and resources, Increase social support, Increase ability to appropriately verbalize feelings, Increase emotional regulation, Facilitate acceptance of mental health diagnosis and concerns, Facilitate patient progression through stages of change regarding substance use diagnoses and concerns, Identify triggers associated with mental health/substance abuse issues, and Increase skills for wellness and recovery  Therapeutic Interventions: Assess for all discharge needs, 1 to 1  time with Social worker, Explore available resources and support systems, Assess for adequacy in community support network, Educate family and significant other(s) on suicide prevention, Complete Psychosocial Assessment, Interpersonal group therapy.  Evaluation of Outcomes: Not Progressing   Progress in Treatment: Attending groups: No. Participating in groups: No. Taking medication as prescribed: Yes. Toleration medication: Yes. Family/Significant other contact made: No, will contact:  CSW will contact husband Nyveah Jungmann  Patient understands diagnosis: Yes. Discussing patient identified problems/goals with staff: No. Medical problems stabilized or resolved: Yes. Denies suicidal/homicidal ideation: Yes. Issues/concerns per patient self-inventory: No. Other: None   New problem(s) identified: No, Describe:  None identified   New Short Term/Long Term Goal(s): elimination of symptoms of psychosis, medication management for mood stabilization; elimination of SI thoughts; development of comprehensive mental wellness plan.   Patient Goals:  Pt declined identifying goals at this time  Discharge Plan or Barriers: CSW will assist with appropriate discharge planning   Reason for Continuation of Hospitalization: Depression Medication stabilization  Estimated Length of Stay: 1 to 7 days   Last 3 [place] Suicide Severity Risk Score: Flowsheet Row Admission (Current) from 08/03/2023 in [facility] ED from 08/02/2023 in [facility] Emergency Department at [facility] Admission (Discharged) from 05/04/2023 in [facility] BE[facility]  C-SSRS RISK CATEGORY High Risk High Risk No Risk       Last PHQ 2/9 Scores:    05/28/2023    3:20 PM 04/04/2023    3:55 PM 03/26/2023    1:59 PM  Depression screen PHQ 2/9  Decreased Interest 0 0 1  Down, Depressed, Hopeless 0 0 0  PHQ - 2 Score 0 0 1  Altered sleeping 0 1 1  Tired, decreased energy 0 1 1  Change in  appetite 0 0 0  Feeling bad or failure about yourself  0 0 0  Trouble concentrating 0 1 1  Moving slowly or fidgety/restless 0 0 1  Suicidal thoughts 0 0 0  PHQ-9 Score 0 3 5  Difficult doing work/chores Not difficult at all Not difficult at all Not difficult at all    Scribe for Treatment Team: [name], [facility] 08/05/2023 3:19 PM

## 2023-08-05 NOTE — Group Note (Signed)
Recreation Therapy Group Note   Group Topic:Health and Wellness  Group Date: 08/05/2023 Start Time: 1100 End Time: 1145 Facilitators: Rosina Lowenstein, LRT, CTRS Location:  Dayroom  Activity Description/Intervention: Therapeutic Drumming. Patients with peers and staff were given the opportunity to engage in a leader facilitated [employer] Group Empowerment Drumming Circle with staff from the [employer], in partnership with [employer]. Teaching laboratory technician and trained [employer], Theodoro Doing leading with LRT observing and documenting intervention and pt response. This evidenced-based practice targets 7 areas of health and wellbeing in the human experience including: stress-reduction, exercise, self-expression, camaraderie/support, nurturing, spirituality, and music-making (leisure).   Goal Area(s) Addresses:  Patient will engage in pro-social way in music group.  Patient will follow directions of drum leader on the first prompt. Patient will demonstrate no behavioral issues during group.  Patient will identify if a reduction in stress level occurs as a result of participation in therapeutic drum circle.    Education: Leisure exposure, Pharmacologist, Musical expression, Discharge Planning    Affect/Mood: N/A   Participation Level: Did not attend     Plan: Continue to engage patient in RT group sessions 2-3x/week.   [address], LRT, CTRS 08/05/2023 1:43 PM

## 2023-08-05 NOTE — Progress Notes (Signed)
[facility] MD Progress Note  08/05/2023 3:18 PM Karina Freeman  MRN:  [id number]  Subjective:    Patient is a 73 year old female presenting to [facility] ED under IVC. Per triage note Pt husband and daughter left pt at home to go run errands and found that pt right ear was bleeding when they returned home and found a wire clothes hanger on the bathroom floor.  Patient was evaluated in the emergency room and admitted to [facility] due to psych unit.  Patient was admitted to the unit in October 2020 for for depression and suicide attempt by overdose.   On exam today, patient is very depressed.  She is relating clothes in her bed, motionless.  She almost appears catatonic but has no other motor symptoms. She is severely depressed.  Tearful.  Reports poor sleep.  Reports low appetite and poor concentration.  Reports passive suicidal thoughts come and go to the hospital, is able contract for safety.  Denies HI.  Denies psychotic symptoms. Patient reports tolerating Zoloft well without side effects. I discussed with her that if she does not respond well to medication she might require ECT due to the severity of her depression. Patient reports having urinary frequency, dysuria, and nocturia. I discussed patient we will start her antibiotic for UTI, with her symptoms in combination with her concerning UA.  Principal Problem: MDD (major depressive disorder) Diagnosis: Principal Problem:   MDD (major depressive disorder)  Total Time spent with patient: 20 minutes  Past Psychiatric History:   Prev Dx/Sx: Depression and anxiety Current Psych Provider: Unable to recall Home Meds (current): None currently Previous Med Trials: Zoloft and trazodone Therapy: None currently   Prior Psych Hospitalization: Was recently discharged from [facility] in October Prior Self Harm: Yes did overdose on medicine Prior Violence: Denies   Family Psych History: Brother with bipolar disorder Family Hx suicide: Bio brother died by  suicide  Past Medical History:  Past Medical History:  Diagnosis Date   Abnormal Pap smear of cervix 11/11/2015   Acute HFrEF (heart failure with reduced ejection fraction) (HCC) 02/22/2023   Acute ST elevation myocardial infarction (STEMI) of anterior wall (HCC) 02/15/2023   Anxiety    CVA (cerebral infarction)    Depressive disorder    GERD (gastroesophageal reflux disease)    Hyperlipidemia    Hypertension    IFG (impaired fasting glucose)    Osteoporosis    ST elevation myocardial infarction (STEMI) (HCC) 02/17/2023    Past Surgical History:  Procedure Laterality Date   APPENDECTOMY     CORONARY IMAGING/OCT N/A 02/21/2023   Procedure: CORONARY IMAGING/OCT;  Surgeon: Yvonne Kendall, MD;  Location: [facility] INVASIVE CV LAB;  Service: Cardiovascular;  Laterality: N/A;   CORONARY PRESSURE/FFR STUDY N/A 02/21/2023   Procedure: CORONARY PRESSURE/FFR STUDY;  Surgeon: Yvonne Kendall, MD;  Location: [facility] INVASIVE CV LAB;  Service: Cardiovascular;  Laterality: N/A;   CORONARY STENT INTERVENTION N/A 02/21/2023   Procedure: CORONARY STENT INTERVENTION;  Surgeon: Yvonne Kendall, MD;  Location: [facility] INVASIVE CV LAB;  Service: Cardiovascular;  Laterality: N/A;   CORONARY/GRAFT ACUTE MI REVASCULARIZATION N/A 02/15/2023   Procedure: Coronary/Graft Acute MI Revascularization;  Surgeon: Iran Ouch, MD;  Location: [facility] INVASIVE CV LAB;  Service: Cardiovascular;  Laterality: N/A;   LEFT HEART CATH AND CORONARY ANGIOGRAPHY N/A 02/15/2023   Procedure: LEFT HEART CATH AND CORONARY ANGIOGRAPHY;  Surgeon: Iran Ouch, MD;  Location: [facility] INVASIVE CV LAB;  Service: Cardiovascular;  Laterality: N/A;   LEFT HEART CATH  AND CORONARY ANGIOGRAPHY N/A 02/21/2023   Procedure: LEFT HEART CATH AND CORONARY ANGIOGRAPHY;  Surgeon: Yvonne Kendall, MD;  Location: [facility] INVASIVE CV LAB;  Service: Cardiovascular;  Laterality: N/A;   TONSILLECTOMY     TUBAL LIGATION     Family History:  Family History  Problem  Relation Age of Onset   Heart disease Mother    Cancer Mother        breast   Heart disease Father    Heart disease Brother    Bipolar disorder Brother    Heart disease Sister    Breast cancer Neg Hx     Social History:  Social History   Substance and Sexual Activity  Alcohol Use No   Alcohol/week: 0.0 standard drinks of alcohol     Social History   Substance and Sexual Activity  Drug Use No    Social History   Socioeconomic History   Marital status: Married    Spouse name: [name]   Number of children: 3   Years of education: Not on file   Highest education level: Not on file  Occupational History   Not on file  Tobacco Use   Smoking status: Former    Current packs/day: 0.00    Types: Cigarettes    Quit date: 07/16/1973    Years since quitting: 50.0   Smokeless tobacco: Never  Vaping Use   Vaping status: Never Used  Substance and Sexual Activity   Alcohol use: No    Alcohol/week: 0.0 standard drinks of alcohol   Drug use: No   Sexual activity: Yes  Other Topics Concern   Not on file  Social History Narrative   Not on file   Social Drivers of Health   Financial Resource Strain: Not on file  Food Insecurity: No Food Insecurity (08/03/2023)   Hunger Vital Sign    Worried About Running Out of Food in the Last Year: Never true    Ran Out of Food in the Last Year: Never true  Transportation Needs: No Transportation Needs (08/03/2023)   PRAPARE - Administrator, Civil Service (Medical): No    Lack of Transportation (Non-Medical): No  Physical Activity: Not on file  Stress: Not on file  Social Connections: Moderately Integrated (08/03/2023)   Social Connection and Isolation Panel [NHANES]    Frequency of Communication with Friends and Family: Once a week    Frequency of Social Gatherings with Friends and Family: Once a week    Attends Religious Services: 1 to 4 times per year    Active Member of [employer] or Organizations: Yes    Attends Tax inspector Meetings: 1 to 4 times per year    Marital Status: Married   Additional Social History:                           Current Medications: Current Facility-Administered Medications  Medication Dose Route Frequency Provider Last Rate Last Admin   acetaminophen (TYLENOL) tablet 650 mg  650 mg Oral Q6H PRN Jearld Lesch, NP   650 mg at 08/04/23 0148   alum & mag hydroxide-simeth (MAALOX/MYLANTA) 200-200-20 MG/5ML suspension 30 mL  30 mL Oral Q4H PRN Jearld Lesch, NP       aspirin chewable tablet 81 mg  81 mg Oral Daily Dixon, Rashaun M, NP   81 mg at 08/05/23 0953   atorvastatin (LIPITOR) tablet 80 mg  80 mg Oral Daily Dixon, Rashaun  M, NP   80 mg at 08/05/23 1191   carvedilol (COREG) tablet 12.5 mg  12.5 mg Oral BID WC Dixon, Rashaun M, NP   12.5 mg at 08/05/23 4782   haloperidol (HALDOL) tablet 5 mg  5 mg Oral TID PRN Jearld Lesch, NP       And   diphenhydrAMINE (BENADRYL) capsule 50 mg  50 mg Oral TID PRN Jearld Lesch, NP       haloperidol lactate (HALDOL) injection 5 mg  5 mg Intramuscular TID PRN Jearld Lesch, NP       And   diphenhydrAMINE (BENADRYL) injection 50 mg  50 mg Intramuscular TID PRN Jearld Lesch, NP       And   LORazepam (ATIVAN) injection 2 mg  2 mg Intramuscular TID PRN Jearld Lesch, NP       haloperidol lactate (HALDOL) injection 10 mg  10 mg Intramuscular TID PRN Jearld Lesch, NP       And   diphenhydrAMINE (BENADRYL) injection 50 mg  50 mg Intramuscular TID PRN Jearld Lesch, NP       And   LORazepam (ATIVAN) injection 2 mg  2 mg Intramuscular TID PRN Jearld Lesch, NP       feeding supplement (ENSURE ENLIVE / ENSURE PLUS) liquid 237 mL  237 mL Oral BID BM Lewanda Rife, MD       hydrOXYzine (ATARAX) tablet 25 mg  25 mg Oral TID PRN Jearld Lesch, NP       losartan (COZAAR) tablet 50 mg  50 mg Oral Daily Dixon, Rashaun M, NP   50 mg at 08/05/23 9562   magnesium hydroxide (MILK OF MAGNESIA) suspension 30  mL  30 mL Oral Daily PRN Jearld Lesch, NP       multivitamin with minerals tablet 1 tablet  1 tablet Oral Daily Lewanda Rife, MD   1 tablet at 08/05/23 1000   [START ON 08/06/2023] sertraline (ZOLOFT) tablet 75 mg  75 mg Oral Daily Elva Breaker, MD       sulfamethoxazole-trimethoprim (BACTRIM DS) 800-160 MG per tablet 1 tablet  1 tablet Oral Q12H Saleen Peden, MD       ticagrelor [facility]) tablet 90 mg  90 mg Oral BID Lerry Liner M, NP   90 mg at 08/05/23 0953   traZODone (DESYREL) tablet 50 mg  50 mg Oral QHS PRN Jearld Lesch, NP   50 mg at 08/03/23 2127    Lab Results: No results found for this or any previous visit (from the past 48 hours).  Blood Alcohol level:  Lab Results  Component Value Date   ETH <10 08/02/2023   ETH <10 05/03/2023    Metabolic Disorder Labs: Lab Results  Component Value Date   HGBA1C 5.4 02/15/2023   MPG 108.28 02/15/2023   No results found for: "PROLACTIN" Lab Results  Component Value Date   CHOL 125 03/14/2023   TRIG 86 03/14/2023   HDL 52 03/14/2023   CHOLHDL 2.4 03/14/2023   VLDL 17 02/15/2023   LDLCALC 56 03/14/2023   LDLCALC 164 (H) 02/15/2023    Physical Findings: AIMS:  , ,  ,  ,    CIWA:    COWS:     Musculoskeletal: Strength & Muscle Tone: Laying in bed Gait & Station: Laying in bed Patient leans: Laying in bed  Psychiatric Specialty Exam:  Presentation  General Appearance:  -- (laying in bed motionless)  Eye Contact:  Fair  Speech: Slow  Speech Volume: Decreased  Handedness: Right   Mood and Affect  Mood: Depressed  Affect: Depressed; Flat   Thought Process  Thought Processes: Linear  Descriptions of Associations:Intact  Orientation:Full (Time, Place and Person)  Thought Content:Logical  History of Schizophrenia/Schizoaffective disorder:No  Duration of Psychotic Symptoms:No data recorded Hallucinations:Hallucinations: None  Ideas of Reference:None  Suicidal  Thoughts:Suicidal Thoughts: Yes, Passive SI Passive Intent and/or Plan: Without Intent; Without Plan  Homicidal Thoughts:Homicidal Thoughts: No   Sensorium  Memory: Immediate Fair; Recent Poor; Remote Fair  Judgment: Impaired  Insight: Lacking   Executive Functions  Concentration: Fair  Attention Span: Poor  Recall: Poor  Fund of Knowledge: Fair  Language: Fair   Psychomotor Activity  Psychomotor Activity: Psychomotor Activity: Normal   Assets  Assets: Desire for Improvement; Resilience; Social Support   Sleep  Sleep: Sleep: Poor    Physical Exam: Physical Exam Vitals reviewed.  Constitutional:      General: She is not in acute distress.    Appearance: She is not toxic-appearing.  Pulmonary:     Effort: Pulmonary effort is normal.  Neurological:     Mental Status: She is alert.  Psychiatric:        Behavior: Behavior normal.    Review of Systems  Constitutional:  Negative for chills and fever.  Cardiovascular:  Negative for chest pain and palpitations.  Neurological:  Negative for dizziness, tingling, tremors and headaches.  Psychiatric/Behavioral:  Positive for depression. Negative for hallucinations, memory loss, substance abuse and suicidal ideas. The patient has insomnia. The patient is not nervous/anxious.   All other systems reviewed and are negative.  Blood pressure (!) 160/83, pulse 72, temperature 98.4 F (36.9 C), resp. rate 18, height 5\' 7"  (1.702 m), weight 64.6 kg, SpO2 95%. Body mass index is 22.32 kg/m.   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 Psychiatric Diagnoses and Treatment:    -Increase Zoloft from 50 mg once daily to 75 mg once daily for depression.  She has a history of MI and Zoloft is preferred antidepressant.  However she is extremely depressed.  She might require a more robust antidepressant.  She might require ECT.  She has profound psychomotor retardation.  Rule out catatonia, could benefit from a trial of Ativan.  If she had not had the MI, I would consider her for a stimulant.  -Start  Bactrim for UTI  --  The risks/benefits/side-effects/alternatives to this medication were discussed in detail with the patient and time was given for questions. The patient consents to medication trial.    -- Metabolic profile and EKG monitoring obtained while on an atypical antipsychotic (BMI: Lipid Panel: HbgA1c: QTc:)   -- Encouraged patient to participate in unit milieu and in scheduled group therapies     3. Medical Issues Being Addressed:    On Brilinta for MI  4. Discharge Planning:   -- Social work and case management to assist with discharge planning and identification of hospital follow-up needs prior to discharge  -- Estimated LOS: 5-7 days  -- Discharge Concerns: Need to establish a safety plan; Medication compliance and effectiveness  -- Discharge Goals: Return home with outpatient referrals for mental health follow-up including medication management/psychotherapy   Phineas Inches, MD 08/05/2023, 3:18 PM  Total Time Spent in Direct Patient Care:  I personally spent 35 minutes on the unit in direct patient care. The direct patient care time included face-to-face time with the  patient, reviewing the patient's chart, communicating with other professionals, and coordinating care. Greater than 50% of this time was spent in counseling or coordinating care with the patient regarding goals of hospitalization, psycho-education, and discharge planning needs.   Phineas Inches, MD Psychiatrist

## 2023-08-06 ENCOUNTER — Ambulatory Visit: Payer: Medicare Other | Admitting: Cardiology

## 2023-08-06 DIAGNOSIS — F332 Major depressive disorder, recurrent severe without psychotic features: Secondary | ICD-10-CM | POA: Diagnosis not present

## 2023-08-06 NOTE — Group Note (Signed)
Recreation Therapy Group Note   Group Topic:Self-Esteem  Group Date: 08/06/2023 Start Time: 1100 End Time: 1140 Facilitators: Rosina Lowenstein, LRT, CTRS Location:  Dayroom  Group Description: Positive Focus. Patients are given a handout that has 9 different boxes on it. Each box has a different prompt on it that requires you to identify and add something positive about themselves. LRT encourages pts to share at least two of their boxes to the group. LRT and pts discuss the importance of "thinking positive", self-esteem and how they can apply it to their everyday life post-discharge.   Goal Area(s) Addressed: Patient will identify positive qualities about themselves. Patient will identify definition of "self-esteem". Patients will identify the difference between positive and negative self-esteem.  Patient will recite positive qualities and affirmations aloud to the group.   Affect/Mood: Flat   Participation Level: Moderate   Participation Quality: Independent   Behavior: Cooperative   Speech/Thought Process: Loose association   Insight: Fair   Judgement: Fair    Modes of Intervention: Education, Guided Discussion, and Worksheet   Patient Response to Interventions:  Receptive   Education Outcome:  In group clarification offered    Clinical Observations/Individualized Feedback: Karina Freeman was mostly active in their participation of session activities and group discussion. Pt identified that she was proud when she had long hair and a way she takes care of herself is by washing her hands. Pt minimally interacted with LRT and peers duration of session.    Plan: Continue to engage patient in RT group sessions 2-3x/week.   Rosina Lowenstein, LRT, CTRS 08/06/2023 1:32 PM

## 2023-08-06 NOTE — Group Note (Signed)
Recreation Therapy Group Note   Group Topic:Other  Group Date: 08/06/2023 Start Time: 1500 End Time: 1600 Facilitators: Rosina Lowenstein, LRT, CTRS Location:  Dayroom  Group Description: Positive Affirmation Bingo. LRT and patients played multiple games of Positive Affirmation Bingo with music playing in the background. LRT and pts discussed what a positive affirmation is, the importance of speaking kindly to yourself, and the use of this as a coping skill.   Goal Area(s) Addressed: Patient will learn positive affirmations.  Patient will engage in recreation activity.  Patient will increase communication.  Patient will identify a new coping skill.    Affect/Mood: Appropriate and Flat   Participation Level: Engaged   Participation Quality: Independent   Behavior: Cooperative   Speech/Thought Process: Directed   Insight: Fair   Judgement: Fair    Modes of Intervention: Competitive Play, Education, Exploration, and Guided Discussion   Patient Response to Interventions:  Engaged   Education Outcome:  In group clarification offered    Clinical Observations/Individualized Feedback: Karina Freeman was mostly active in their participation of session activities and group discussion. Pt minimally interacted with LRT and peers while present in group. Pt appropriately played multiple ronds of bingo with minimal assistance.    Plan: Continue to engage patient in RT group sessions 2-3x/week.   Rosina Lowenstein, LRT, CTRS 08/06/2023 4:57 PM

## 2023-08-06 NOTE — Progress Notes (Signed)
   08/06/23 2300  Psych Admission Type (Psych Patients Only)  Admission Status Involuntary  Psychosocial Assessment  Patient Complaints Depression  Eye Contact Fair  Facial Expression Flat  Affect Flat  Speech Soft  Interaction Guarded  Motor Activity Slow  Appearance/Hygiene Unremarkable  Behavior Characteristics Cooperative  Mood Depressed  Thought Process  Coherency WDL  Content WDL  Delusions None reported or observed  Perception WDL  Hallucination None reported or observed  Judgment Impaired  Confusion None  Danger to Self  Current suicidal ideation? Denies  Danger to Others  Danger to Others None reported or observed

## 2023-08-06 NOTE — Group Note (Signed)
[facility] LCSW Group Therapy Note   Group Date: 08/06/2023 Start Time: 1300 End Time: 1400   Type of Therapy/Topic:  Group Therapy:  Emotion Regulation  Participation Level:  Active   Mood:  Description of Group:    The purpose of this group is to assist patients in learning to regulate negative emotions and experience positive emotions. Patients will be guided to discuss ways in which they have been vulnerable to their negative emotions. These vulnerabilities will be juxtaposed with experiences of positive emotions or situations, and patients challenged to use positive emotions to combat negative ones. Special emphasis will be placed on coping with negative emotions in conflict situations, and patients will process healthy conflict resolution skills.  Therapeutic Goals: Patient will identify two positive emotions or experiences to reflect on in order to balance out negative emotions:  Patient will label two or more emotions that they find the most difficult to experience:  Patient will be able to demonstrate positive conflict resolution skills through discussion or role plays:   Summary of Patient Progress:   Pt able to identify multiple coping skills to assist with emotional regulation     Therapeutic Modalities:   Cognitive Behavioral Therapy Feelings Identification Dialectical Behavioral Therapy   Karina Freeman, LCSWA

## 2023-08-06 NOTE — Progress Notes (Signed)
   08/06/23 1300  Psych Admission Type (Psych Patients Only)  Admission Status Involuntary  Psychosocial Assessment  Patient Complaints Anger;Irritability  Eye Contact Fair  Facial Expression Flat  Affect Flat  Speech Soft  Interaction Guarded  Motor Activity Slow  Appearance/Hygiene Unremarkable  Behavior Characteristics Cooperative  Mood Depressed  Thought Process  Coherency WDL  Content WDL  Delusions None reported or observed  Perception WDL  Hallucination None reported or observed  Judgment Impaired  Confusion None  Danger to Self  Current suicidal ideation? Denies  Danger to Others  Danger to Others None reported or observed

## 2023-08-06 NOTE — Group Note (Deleted)
Recreation Therapy Group Note   Group Topic:Problem Solving  Group Date: 08/06/2023 Start Time: 1000 End Time: 1050 Facilitators: Rosina Lowenstein, LRT Location:  Craft Room  Group Description: Life Boat. Patients were given the scenario that they are on a boat that is about to become shipwrecked, leaving them stranded on an [place]. They are asked to make a list of 15 different items that they want to take with them when they are stranded on the [place]. Patients are asked to rank their items from most important to least important, #1 being the most important and #15 being the least. Patients will work individually for the first round to come up with 15 items and then pair up with a peer(s) to condense their list and come up with one list of 15 items between the two of them. Patients or LRT will read aloud the 15 different items to the group after each round. LRT facilitated post-activity processing to discuss how this activity can be used in daily life post discharge.   Goal Area(s) Addressed:  Patient will identify priorities, wants and needs. Patient will communicate with LRT and peers. Patient will work collectively as a Administrator, Civil Service. Patient will work on Product manager.        Affect/Mood: {RT [facility] Affect/Mood:26271}   Participation Level: {RT [facility] Participation Level:26267}   Participation Quality: {RT [facility] Participation Quality:26268}   Behavior: {RT [facility] Group Behavior:26269}   Speech/Thought Process: {RT [facility] Speech/Thought:26276}   Insight: {RT B[facility] Insight:26272}   Judgement: {RT [facility] Judgement:26278}   Modes of Intervention: {RT B[facility] Modes of Intervention:26277}   Patient Response to Interventions:  {RT B[facility] Patient Response to Intervention:26274}   Education Outcome:  {RT [facility] Education Outcome:26279}   Clinical Observations/Individualized Feedback: *** was *** in their participation of session activities and group discussion. Pt identified ***   Plan: {RT BHH Tx  ONGE:95284}   Rosina Lowenstein, LRT,  08/06/2023 12:31 PM

## 2023-08-06 NOTE — [facility] Suicide Risk Assessment (Signed)
[facility] INPATIENT:  Family/Significant Other Suicide Prevention Education  Suicide Prevention Education:  Education Completed; Sharon Caress, [phone] ,  (name of family member/significant other) has been identified by the patient as the family member/significant other with whom the patient will be residing, and identified as the person(s) who will aid the patient in the event of a mental health crisis (suicidal ideations/suicide attempt).  With written consent from the patient, the family member/significant other has been provided the following suicide prevention education, prior to the and/or following the discharge of the patient.  The suicide prevention education provided includes the following: Suicide risk factors Suicide prevention and interventions National Suicide Hotline telephone number [facility] assessment telephone number [facility] Emergency Assistance 911 [facility] and/or Residential Mobile Crisis Unit telephone number  Request made of family/significant other to: Remove weapons (e.g., guns, rifles, knives), all items previously/currently identified as safety concern.   Remove drugs/medications (over-the-counter, prescriptions, illicit drugs), all items previously/currently identified as a safety concern.  The family member/significant other verbalizes understanding of the suicide prevention education information provided.  The family member/significant other agrees to remove the items of safety concern listed above.  Karina Freeman 08/06/2023, 2:14 PM

## 2023-08-06 NOTE — Group Note (Signed)
Date:  08/06/2023 Time:  10:35 PM  Group Topic/Focus:  Wrap-Up Group:   The focus of this group is to help patients review their daily goal of treatment and discuss progress on daily workbooks.    Participation Level:  Active  Participation Quality:  Appropriate  Affect:  Appropriate  Cognitive:  Appropriate  Insight: Improving  Engagement in Group:  Improving  Modes of Intervention:  Discussion  Additional Comments:    Karina Freeman 08/06/2023, 10:35 PM

## 2023-08-06 NOTE — [facility] Counselor (Signed)
CSW received call from pt's husband Kennya Ojo.   Pt's husband reports he wants an update on how pt is doing on the unit.   CSW provided brief updated regarding group participation and medication adherence according to notes in the chart.   [name] requests call from provider to discuss pt.   CSW informed Dr. Marval Regal husband is wanting a call.   Reynaldo Minium, MSW, [place] 08/06/2023 1:09 PM

## 2023-08-06 NOTE — Progress Notes (Signed)
[facility] MD Progress Note  08/06/2023 12:31 PM Karina Freeman  MRN:  [id number]  Subjective:    Patient is a 73 year old female presenting to [facility] ED under IVC. Per triage note Pt husband and daughter left pt at home to go run errands and found that pt right ear was bleeding when they returned home and found a wire clothes hanger on the bathroom floor.  Patient was evaluated in the emergency room and admitted to [facility] due to psych unit.  Patient was admitted to the unit in October 2020 for for depression and suicide attempt by overdose.   Chart reviewed, case discussed in multidisciplinary meeting, patient seen during rounds.  Per staff report patient is staying depressed on the unit.  She has refused to EKG.  On examination, patient continues to report depressed mood. Reports poor sleep.  Reports low appetite and poor concentration.  Denies HI.  Denies psychotic symptoms. Patient reports tolerating Zoloft well without side effects.  Principal Problem: MDD (major depressive disorder) Diagnosis: Principal Problem:   MDD (major depressive disorder)  Total Time spent with patient: 20 minutes  Past Psychiatric History:   Prev Dx/Sx: Depression and anxiety Current Psych Provider: Unable to recall Home Meds (current): None currently Previous Med Trials: Zoloft and trazodone Therapy: None currently   Prior Psych Hospitalization: Was recently discharged from [facility] in October Prior Self Harm: Yes did overdose on medicine Prior Violence: Denies   Family Psych History: Brother with bipolar disorder Family Hx suicide: Bio brother died by suicide  Past Medical History:  Past Medical History:  Diagnosis Date   Abnormal Pap smear of cervix 11/11/2015   Acute HFrEF (heart failure with reduced ejection fraction) (HCC) 02/22/2023   Acute ST elevation myocardial infarction (STEMI) of anterior wall (HCC) 02/15/2023   Anxiety    CVA (cerebral infarction)    Depressive disorder    GERD (gastroesophageal  reflux disease)    Hyperlipidemia    Hypertension    IFG (impaired fasting glucose)    Osteoporosis    ST elevation myocardial infarction (STEMI) (HCC) 02/17/2023    Past Surgical History:  Procedure Laterality Date   APPENDECTOMY     CORONARY IMAGING/OCT N/A 02/21/2023   Procedure: CORONARY IMAGING/OCT;  Surgeon: Yvonne Kendall, MD;  Location: [facility] INVASIVE CV LAB;  Service: Cardiovascular;  Laterality: N/A;   CORONARY PRESSURE/FFR STUDY N/A 02/21/2023   Procedure: CORONARY PRESSURE/FFR STUDY;  Surgeon: Yvonne Kendall, MD;  Location: [facility] INVASIVE CV LAB;  Service: Cardiovascular;  Laterality: N/A;   CORONARY STENT INTERVENTION N/A 02/21/2023   Procedure: CORONARY STENT INTERVENTION;  Surgeon: Yvonne Kendall, MD;  Location: [facility] INVASIVE CV LAB;  Service: Cardiovascular;  Laterality: N/A;   CORONARY/GRAFT ACUTE MI REVASCULARIZATION N/A 02/15/2023   Procedure: Coronary/Graft Acute MI Revascularization;  Surgeon: Iran Ouch, MD;  Location: [facility] INVASIVE CV LAB;  Service: Cardiovascular;  Laterality: N/A;   LEFT HEART CATH AND CORONARY ANGIOGRAPHY N/A 02/15/2023   Procedure: LEFT HEART CATH AND CORONARY ANGIOGRAPHY;  Surgeon: Iran Ouch, MD;  Location: [facility] INVASIVE CV LAB;  Service: Cardiovascular;  Laterality: N/A;   LEFT HEART CATH AND CORONARY ANGIOGRAPHY N/A 02/21/2023   Procedure: LEFT HEART CATH AND CORONARY ANGIOGRAPHY;  Surgeon: Yvonne Kendall, MD;  Location: [facility] INVASIVE CV LAB;  Service: Cardiovascular;  Laterality: N/A;   TONSILLECTOMY     TUBAL LIGATION     Family History:  Family History  Problem Relation Age of Onset   Heart disease Mother    Cancer  Mother        breast   Heart disease Father    Heart disease Brother    Bipolar disorder Brother    Heart disease Sister    Breast cancer Neg Hx     Social History:  Social History   Substance and Sexual Activity  Alcohol Use No   Alcohol/week: 0.0 standard drinks of alcohol     Social History    Substance and Sexual Activity  Drug Use No    Social History   Socioeconomic History   Marital status: Married    Spouse name: [name]   Number of children: 3   Years of education: Not on file   Highest education level: Not on file  Occupational History   Not on file  Tobacco Use   Smoking status: Former    Current packs/day: 0.00    Types: Cigarettes    Quit date: 07/16/1973    Years since quitting: 50.0   Smokeless tobacco: Never  Vaping Use   Vaping status: Never Used  Substance and Sexual Activity   Alcohol use: No    Alcohol/week: 0.0 standard drinks of alcohol   Drug use: No   Sexual activity: Yes  Other Topics Concern   Not on file  Social History Narrative   Not on file   Social Drivers of Health   Financial Resource Strain: Not on file  Food Insecurity: No Food Insecurity (08/03/2023)   Hunger Vital Sign    Worried About Running Out of Food in the Last Year: Never true    Ran Out of Food in the Last Year: Never true  Transportation Needs: No Transportation Needs (08/03/2023)   PRAPARE - Administrator, Civil Service (Medical): No    Lack of Transportation (Non-Medical): No  Physical Activity: Not on file  Stress: Not on file  Social Connections: Moderately Integrated (08/03/2023)   Social Connection and Isolation Panel [NHANES]    Frequency of Communication with Friends and Family: Once a week    Frequency of Social Gatherings with Friends and Family: Once a week    Attends Religious Services: 1 to 4 times per year    Active Member of [employer] or Organizations: Yes    Attends Banker Meetings: 1 to 4 times per year    Marital Status: Married   Additional Social History:                           Current Medications: Current Facility-Administered Medications  Medication Dose Route Frequency Provider Last Rate Last Admin   acetaminophen (TYLENOL) tablet 650 mg  650 mg Oral Q6H PRN Jearld Lesch, NP   650 mg at 08/04/23  0148   alum & mag hydroxide-simeth (MAALOX/MYLANTA) 200-200-20 MG/5ML suspension 30 mL  30 mL Oral Q4H PRN Jearld Lesch, NP       aspirin chewable tablet 81 mg  81 mg Oral Daily Dixon, Rashaun M, NP   81 mg at 08/06/23 0916   atorvastatin (LIPITOR) tablet 80 mg  80 mg Oral Daily Dixon, Rashaun M, NP   80 mg at 08/06/23 0916   carvedilol (COREG) tablet 12.5 mg  12.5 mg Oral BID WC Dixon, Rashaun M, NP   12.5 mg at 08/06/23 0916   haloperidol (HALDOL) tablet 5 mg  5 mg Oral TID PRN Jearld Lesch, NP       And   diphenhydrAMINE (BENADRYL) capsule 50 mg  50 mg Oral TID PRN Jearld Lesch, NP       haloperidol lactate (HALDOL) injection 5 mg  5 mg Intramuscular TID PRN Jearld Lesch, NP       And   diphenhydrAMINE (BENADRYL) injection 50 mg  50 mg Intramuscular TID PRN Jearld Lesch, NP       And   LORazepam (ATIVAN) injection 2 mg  2 mg Intramuscular TID PRN Jearld Lesch, NP       haloperidol lactate (HALDOL) injection 10 mg  10 mg Intramuscular TID PRN Jearld Lesch, NP       And   diphenhydrAMINE (BENADRYL) injection 50 mg  50 mg Intramuscular TID PRN Jearld Lesch, NP       And   LORazepam (ATIVAN) injection 2 mg  2 mg Intramuscular TID PRN Jearld Lesch, NP       feeding supplement (ENSURE ENLIVE / ENSURE PLUS) liquid 237 mL  237 mL Oral BID BM Lewanda Rife, MD   237 mL at 08/06/23 1013   hydrOXYzine (ATARAX) tablet 25 mg  25 mg Oral TID PRN Jearld Lesch, NP       losartan (COZAAR) tablet 50 mg  50 mg Oral Daily Dixon, Rashaun M, NP   50 mg at 08/06/23 1610   magnesium hydroxide (MILK OF MAGNESIA) suspension 30 mL  30 mL Oral Daily PRN Jearld Lesch, NP       multivitamin with minerals tablet 1 tablet  1 tablet Oral Daily Lewanda Rife, MD   1 tablet at 08/06/23 0916   sertraline (ZOLOFT) tablet 75 mg  75 mg Oral Daily Massengill, Harrold Donath, MD   75 mg at 08/06/23 0915   sulfamethoxazole-trimethoprim (BACTRIM DS) 800-160 MG per tablet 1 tablet  1  tablet Oral Q12H Massengill, Harrold Donath, MD   1 tablet at 08/06/23 0915   ticagrelor (BRILINTA) tablet 90 mg  90 mg Oral BID Lerry Liner M, NP   90 mg at 08/06/23 0916   traZODone (DESYREL) tablet 50 mg  50 mg Oral QHS PRN Jearld Lesch, NP   50 mg at 08/05/23 2121    Lab Results: No results found for this or any previous visit (from the past 48 hours).  Blood Alcohol level:  Lab Results  Component Value Date   ETH <10 08/02/2023   ETH <10 05/03/2023    Metabolic Disorder Labs: Lab Results  Component Value Date   HGBA1C 5.4 02/15/2023   MPG 108.28 02/15/2023   No results found for: "PROLACTIN" Lab Results  Component Value Date   CHOL 125 03/14/2023   TRIG 86 03/14/2023   HDL 52 03/14/2023   CHOLHDL 2.4 03/14/2023   VLDL 17 02/15/2023   LDLCALC 56 03/14/2023   LDLCALC 164 (H) 02/15/2023    Physical Findings: AIMS:  , ,  ,  ,    CIWA:    COWS:     Musculoskeletal: Strength & Muscle Tone: WNL Gait & Station: Normal Patient leans: NA  Psychiatric Specialty Exam:  Presentation  General Appearance:  Appropriate  Eye Contact: Fair  Speech: Slow  Speech Volume: Decreased  Handedness: Right   Mood and Affect  Mood: Depressed  Affect: Depressed; Flat   Thought Process  Thought Processes: Linear  Descriptions of Associations:Intact  Orientation:Full (Time, Place and Person)  Thought Content:Logical  History of Schizophrenia/Schizoaffective disorder:No  Duration of Psychotic Symptoms:NA Hallucinations:Hallucinations: None  Ideas of Reference:None  Suicidal Thoughts:Suicidal Thoughts: Yes, Passive SI Passive Intent  and/or Plan: Without Intent; Without Plan  Homicidal Thoughts:Homicidal Thoughts: No   Sensorium  Memory: Fair  Judgment: Impaired  Insight: Lacking   Executive Functions  Concentration: Fair  Attention Span: Poor  Recall: Poor  Fund of Knowledge: Fair  Language: Fair   Psychomotor Activity   Psychomotor Activity: Psychomotor Activity: Normal   Assets  Assets: Desire for Improvement; Resilience; Social Support   Sleep  Sleep: Sleep: Poor    Physical Exam: Physical Exam Vitals reviewed.  Constitutional:      General: She is not in acute distress.    Appearance: She is not toxic-appearing.  Pulmonary:     Effort: Pulmonary effort is normal.  Neurological:     Mental Status: She is alert.  Psychiatric:        Behavior: Behavior normal.    Review of Systems  Constitutional:  Negative for chills and fever.  Cardiovascular:  Negative for chest pain and palpitations.  Neurological:  Negative for dizziness, tingling, tremors and headaches.  Psychiatric/Behavioral:  Positive for depression. Negative for hallucinations, memory loss, substance abuse and suicidal ideas. The patient has insomnia. The patient is not nervous/anxious.   All other systems reviewed and are negative.  Blood pressure 131/77, pulse 63, temperature (!) 97.5 F (36.4 C), resp. rate 18, height 5\' 7"  (1.702 m), weight 64.6 kg, SpO2 98%. Body mass index is 22.32 kg/m.   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 Psychiatric Diagnoses and Treatment:    -Increased Zoloft from 50 mg once  daily to 75 mg once daily for depression. 08/05/23   -Bactrim for UTI  --  The risks/benefits/side-effects/alternatives to this medication were discussed in detail with the patient and time was given for questions. The patient consents to medication trial.    -- Metabolic profile and EKG monitoring obtained while on an atypical antipsychotic (BMI: Lipid Panel: HbgA1c: QTc:)   -- Encouraged patient to participate in unit milieu and in scheduled group therapies     3. Medical Issues Being Addressed:    On Brilinta for MI   4. Discharge Planning:   -- Social work and case management to assist with discharge planning and identification of hospital follow-up needs prior to discharge  -- Estimated LOS: 5-7 days  -- Discharge Concerns: Need to establish a safety plan; Medication compliance and effectiveness  -- Discharge Goals: Return home with outpatient referrals for mental health follow-up including medication management/psychotherapy   Lewanda Rife, MD 08/06/2023, 12:31 PM

## 2023-08-06 NOTE — BH Assessment (Signed)
Recreation Therapy Notes  INPATIENT RECREATION THERAPY ASSESSMENT  Patient Details Name: Karina Freeman MRN: [id number] DOB: 1950/11/18 Today's Date: 08/06/2023       Information Obtained From: Patient  Able to Participate in Assessment/Interview: No   LRT attempted to meet with Marchelle Folks. Every question LRT asked, pt responded that she was "trying to get things sorted out". LRT asked how she could best help pt. Pt said she was not sure. LRT suggested started with a monthly calendar of January and February printed out. Pt face lit up some and said that that would be helpful.   LRT provided pt with a printed handout of January and February. Pt asked what day she got to the unit. That date was also written on the calendar.   Pt said that things are "fuzzy" and that she has as hard time remembering.   Pt was thankful and receptive to LRT.   [id number] LRT, CTRS Tishie Altmann E Brynlea Spindler 08/06/2023, 5:03 PM

## 2023-08-07 DIAGNOSIS — F332 Major depressive disorder, recurrent severe without psychotic features: Secondary | ICD-10-CM | POA: Diagnosis not present

## 2023-08-07 MED ORDER — CLONAZEPAM 0.5 MG PO TABS
0.5000 mg | ORAL_TABLET | Freq: Every day | ORAL | Status: DC
  Administered 2023-08-07 – 2023-08-17 (×11): 0.5 mg via ORAL
  Filled 2023-08-07 (×11): qty 1

## 2023-08-07 NOTE — Group Note (Signed)
Date:  08/07/2023 Time:  9:07 PM  Group Topic/Focus:  Coping With Mental Health Crisis:   The purpose of this group is to help patients identify strategies for coping with mental health crisis.  Group discusses possible causes of crisis and ways to manage them effectively.    Participation Level:  Did not attend  Participation Quality:  Did not attend  Affect:   Did not attend  Cognitive:   Did not attend  Insight: None  Engagement in Group:  None  Modes of Intervention:   Did not attend  Additional Comments:    Garry Heater 08/07/2023, 9:07 PM

## 2023-08-07 NOTE — Progress Notes (Signed)
   08/07/23 1200  Psych Admission Type (Psych Patients Only)  Admission Status Involuntary  Psychosocial Assessment  Patient Complaints Depression  Eye Contact Fair  Facial Expression Flat  Affect Flat  Speech Soft  Interaction Guarded  Motor Activity Slow  Appearance/Hygiene Unremarkable  Behavior Characteristics Cooperative  Mood Depressed  Thought Process  Coherency WDL  Content WDL  Delusions None reported or observed  Perception WDL  Hallucination None reported or observed  Judgment Impaired  Confusion None  Danger to Self  Current suicidal ideation? Denies  Danger to Others  Danger to Others None reported or observed

## 2023-08-07 NOTE — Progress Notes (Signed)
[facility] MD Progress Note  08/07/2023  Karina Freeman  MRN:  [id number]  Subjective:    Patient is a 73 year old female presenting to [facility] ED under IVC. Per triage note Pt husband and daughter left pt at home to go run errands and found that pt right ear was bleeding when they returned home and found a wire clothes hanger on the bathroom floor.  Patient was evaluated in the emergency room and admitted to [facility] due to psych unit.  Patient was admitted to the unit in October 2020 for for depression and suicide attempt by overdose.   Chart reviewed, case discussed in multidisciplinary meeting, patient seen during rounds.  Per staff report patient is staying depressed on the unit.  On examination, patient reports "fine" mood today. Reports appetite and sleep has improved. Patient has slowed thought process. She said she is having "problems" with her thoughts. She feels anxious because of this. We discussed use of low dose of Klonopin .Patient agrees to try klonopin. Patient reports tolerating Zoloft well without side effects.  I spoke with patient's husband after getting a verbal consent from the patient. His questions were answered to his satisfaction, we discussed patient's progress and treatment plan.  Principal Problem: MDD (major depressive disorder) Diagnosis: Principal Problem:   MDD (major depressive disorder)    Past Psychiatric History:   Prev Dx/Sx: Depression and anxiety Current Psych Provider: Unable to recall Home Meds (current): None currently Previous Med Trials: Zoloft and trazodone Therapy: None currently   Prior Psych Hospitalization: Was recently discharged from [facility] in October Prior Self Harm: Yes did overdose on medicine Prior Violence: Denies   Family Psych History: Brother with bipolar disorder Family Hx suicide: Bio brother died by suicide  Past Medical History:  Past Medical History:  Diagnosis Date   Abnormal Pap smear of cervix 11/11/2015   Acute HFrEF (heart  failure with reduced ejection fraction) (HCC) 02/22/2023   Acute ST elevation myocardial infarction (STEMI) of anterior wall (HCC) 02/15/2023   Anxiety    CVA (cerebral infarction)    Depressive disorder    GERD (gastroesophageal reflux disease)    Hyperlipidemia    Hypertension    IFG (impaired fasting glucose)    Osteoporosis    ST elevation myocardial infarction (STEMI) (HCC) 02/17/2023    Past Surgical History:  Procedure Laterality Date   APPENDECTOMY     CORONARY IMAGING/OCT N/A 02/21/2023   Procedure: CORONARY IMAGING/OCT;  Surgeon: Yvonne Kendall, MD;  Location: [facility] INVASIVE CV LAB;  Service: Cardiovascular;  Laterality: N/A;   CORONARY PRESSURE/FFR STUDY N/A 02/21/2023   Procedure: CORONARY PRESSURE/FFR STUDY;  Surgeon: Yvonne Kendall, MD;  Location: [facility] INVASIVE CV LAB;  Service: Cardiovascular;  Laterality: N/A;   CORONARY STENT INTERVENTION N/A 02/21/2023   Procedure: CORONARY STENT INTERVENTION;  Surgeon: Yvonne Kendall, MD;  Location: [facility] INVASIVE CV LAB;  Service: Cardiovascular;  Laterality: N/A;   CORONARY/GRAFT ACUTE MI REVASCULARIZATION N/A 02/15/2023   Procedure: Coronary/Graft Acute MI Revascularization;  Surgeon: Iran Ouch, MD;  Location: [facility] INVASIVE CV LAB;  Service: Cardiovascular;  Laterality: N/A;   LEFT HEART CATH AND CORONARY ANGIOGRAPHY N/A 02/15/2023   Procedure: LEFT HEART CATH AND CORONARY ANGIOGRAPHY;  Surgeon: Iran Ouch, MD;  Location: [facility] INVASIVE CV LAB;  Service: Cardiovascular;  Laterality: N/A;   LEFT HEART CATH AND CORONARY ANGIOGRAPHY N/A 02/21/2023   Procedure: LEFT HEART CATH AND CORONARY ANGIOGRAPHY;  Surgeon: Yvonne Kendall, MD;  Location: [facility] INVASIVE CV LAB;  Service: Cardiovascular;  Laterality: N/A;   TONSILLECTOMY     TUBAL LIGATION     Family History:  Family History  Problem Relation Age of Onset   Heart disease Mother    Cancer Mother        breast   Heart disease Father    Heart disease Brother    Bipolar  disorder Brother    Heart disease Sister    Breast cancer Neg Hx     Social History:  Social History   Substance and Sexual Activity  Alcohol Use No   Alcohol/week: 0.0 standard drinks of alcohol     Social History   Substance and Sexual Activity  Drug Use No    Social History   Socioeconomic History   Marital status: Married    Spouse name: [name]   Number of children: 3   Years of education: Not on file   Highest education level: Not on file  Occupational History   Not on file  Tobacco Use   Smoking status: Former    Current packs/day: 0.00    Types: Cigarettes    Quit date: 07/16/1973    Years since quitting: 50.0   Smokeless tobacco: Never  Vaping Use   Vaping status: Never Used  Substance and Sexual Activity   Alcohol use: No    Alcohol/week: 0.0 standard drinks of alcohol   Drug use: No   Sexual activity: Yes  Other Topics Concern   Not on file  Social History Narrative   Not on file   Social Drivers of Health   Financial Resource Strain: Not on file  Food Insecurity: No Food Insecurity (08/03/2023)   Hunger Vital Sign    Worried About Running Out of Food in the Last Year: Never true    Ran Out of Food in the Last Year: Never true  Transportation Needs: No Transportation Needs (08/03/2023)   PRAPARE - Administrator, Civil Service (Medical): No    Lack of Transportation (Non-Medical): No  Physical Activity: Not on file  Stress: Not on file  Social Connections: Moderately Integrated (08/03/2023)   Social Connection and Isolation Panel [NHANES]    Frequency of Communication with Friends and Family: Once a week    Frequency of Social Gatherings with Friends and Family: Once a week    Attends Religious Services: 1 to 4 times per year    Active Member of [employer] or Organizations: Yes    Attends Banker Meetings: 1 to 4 times per year    Marital Status: Married   Additional Social History:                            Current Medications: Current Facility-Administered Medications  Medication Dose Route Frequency Provider Last Rate Last Admin   acetaminophen (TYLENOL) tablet 650 mg  650 mg Oral Q6H PRN Jearld Lesch, NP   650 mg at 08/04/23 0148   alum & mag hydroxide-simeth (MAALOX/MYLANTA) 200-200-20 MG/5ML suspension 30 mL  30 mL Oral Q4H PRN Jearld Lesch, NP       aspirin chewable tablet 81 mg  81 mg Oral Daily Dixon, Rashaun M, NP   81 mg at 08/07/23 1042   atorvastatin (LIPITOR) tablet 80 mg  80 mg Oral Daily Dixon, Rashaun M, NP   80 mg at 08/07/23 1042   carvedilol (COREG) tablet 12.5 mg  12.5 mg Oral BID WC Dixon, Elray Buba, NP   12.5  mg at 08/07/23 1042   haloperidol (HALDOL) tablet 5 mg  5 mg Oral TID PRN Jearld Lesch, NP       And   diphenhydrAMINE (BENADRYL) capsule 50 mg  50 mg Oral TID PRN Jearld Lesch, NP       haloperidol lactate (HALDOL) injection 5 mg  5 mg Intramuscular TID PRN Jearld Lesch, NP       And   diphenhydrAMINE (BENADRYL) injection 50 mg  50 mg Intramuscular TID PRN Jearld Lesch, NP       And   LORazepam (ATIVAN) injection 2 mg  2 mg Intramuscular TID PRN Jearld Lesch, NP       haloperidol lactate (HALDOL) injection 10 mg  10 mg Intramuscular TID PRN Jearld Lesch, NP       And   diphenhydrAMINE (BENADRYL) injection 50 mg  50 mg Intramuscular TID PRN Jearld Lesch, NP       And   LORazepam (ATIVAN) injection 2 mg  2 mg Intramuscular TID PRN Jearld Lesch, NP       feeding supplement (ENSURE ENLIVE / ENSURE PLUS) liquid 237 mL  237 mL Oral BID BM Sarina Ill, DO   237 mL at 08/07/23 1014   hydrOXYzine (ATARAX) tablet 25 mg  25 mg Oral TID PRN Jearld Lesch, NP       losartan (COZAAR) tablet 50 mg  50 mg Oral Daily Dixon, Rashaun M, NP   50 mg at 08/07/23 1042   magnesium hydroxide (MILK OF MAGNESIA) suspension 30 mL  30 mL Oral Daily PRN Jearld Lesch, NP       multivitamin with minerals tablet 1 tablet  1 tablet  Oral Daily Sarina Ill, DO   1 tablet at 08/07/23 1042   sertraline (ZOLOFT) tablet 75 mg  75 mg Oral Daily Massengill, Harrold Donath, MD   75 mg at 08/07/23 1042   sulfamethoxazole-trimethoprim (BACTRIM DS) 800-160 MG per tablet 1 tablet  1 tablet Oral Q12H Massengill, Harrold Donath, MD   1 tablet at 08/07/23 1042   ticagrelor (BRILINTA) tablet 90 mg  90 mg Oral BID Lerry Liner M, NP   90 mg at 08/07/23 1046   traZODone (DESYREL) tablet 50 mg  50 mg Oral QHS PRN Jearld Lesch, NP   50 mg at 08/06/23 2109    Lab Results: No results found for this or any previous visit (from the past 48 hours).  Blood Alcohol level:  Lab Results  Component Value Date   ETH <10 08/02/2023   ETH <10 05/03/2023    Metabolic Disorder Labs: Lab Results  Component Value Date   HGBA1C 5.4 02/15/2023   MPG 108.28 02/15/2023   No results found for: "PROLACTIN" Lab Results  Component Value Date   CHOL 125 03/14/2023   TRIG 86 03/14/2023   HDL 52 03/14/2023   CHOLHDL 2.4 03/14/2023   VLDL 17 02/15/2023   LDLCALC 56 03/14/2023   LDLCALC 164 (H) 02/15/2023    Physical Findings: AIMS:  , ,  ,  ,    CIWA:    COWS:     Musculoskeletal: Strength & Muscle Tone: WNL Gait & Station: Normal Patient leans: NA   Psychiatric Specialty Exam:   Presentation  General Appearance:  Appropriate   Eye Contact: Fair   Speech: Slow   Speech Volume: Decreased   Handedness: Right     Mood and Affect  Mood: Depressed   Affect: Depressed;  Flat     Thought Process  Thought Processes: Linear   Descriptions of Associations:Intact   Orientation:Full (Time, Place and Person)   Thought Content:Logical   History of Schizophrenia/Schizoaffective disorder:No   Duration of Psychotic Symptoms:NA Hallucinations:Hallucinations: None   Ideas of Reference:None   Suicidal Thoughts:Suicidal Thoughts: Yes, Passive SI Passive Intent and/or Plan: Without Intent; Without Plan   Homicidal  Thoughts:Homicidal Thoughts: No     Sensorium  Memory: Fair   Judgment: Impaired   Insight: Lacking     Executive Functions  Concentration: Fair   Attention Span: Poor   Recall: Poor   Fund of Knowledge: Fair   Language: Fair     Psychomotor Activity  Psychomotor Activity: Psychomotor Activity: Normal     Assets  Assets: Desire for Improvement; Resilience; Social Support     Sleep  Sleep: Sleep: Poor       Physical Exam: Physical Exam Vitals reviewed.  Constitutional:      General: She is not in acute distress.    Appearance: She is not toxic-appearing.  Pulmonary:     Effort: Pulmonary effort is normal.  Neurological:     Mental Status: She is alert.  Psychiatric:        Behavior: Behavior normal.      Review of Systems  Constitutional:  Negative for chills and fever.  Cardiovascular:  Negative for chest pain and palpitations.  Neurological:  Negative for dizziness, tingling, tremors and headaches.  Psychiatric/Behavioral:  Positive for depression. Negative for hallucinations, memory loss, substance abuse and suicidal ideas.   All other systems reviewed and are negative.  Blood pressure (!) 150/65, pulse 66, temperature (!) 97.5 F (36.4 C), resp. rate 18, height 5\' 7"  (1.702 m), weight 64.6 kg, SpO2 98%. Body mass index is 22.32 kg/m.   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 Psychiatric Diagnoses and Treatment:    -Increased Zoloft from 50 mg once daily to 75 mg once daily for depression. 08/05/23   -Bactrim for UTI  -- Encouraged patient to participate in unit milieu and in scheduled group therapies     3. Medical Issues Being Addressed:    On Brilinta for MI   4. Discharge Planning:   -- Social work and case management to assist with discharge planning and identification of hospital follow-up needs prior to discharge  -- Estimated LOS: 4-5 days  -- Discharge Goals: Return home with outpatient referrals for mental health follow-up including medication management/psychotherapy   Lewanda Rife, MD

## 2023-08-07 NOTE — Group Note (Signed)
Date:  08/07/2023 Time:  11:06 AM  Group Topic/Focus:  Anger Coping Skills and Techniques  The purpose of this group is to allow patients to learn the different coping skills to recognize when they are angry and use breathing techniques to calm down and use their coping skills in a positive way.     Participation Level:  Active  Participation Quality:  Appropriate  Affect:  Appropriate  Cognitive:  Appropriate  Insight: Appropriate  Engagement in Group:  Engaged  Modes of Intervention:  Activity and Discussion  Additional Comments:    Marta Antu 08/07/2023, 11:06 AM

## 2023-08-07 NOTE — Plan of Care (Signed)
  Problem: Education: Goal: Ability to make informed decisions regarding treatment will improve Outcome: Not Progressing   Problem: Coping: Goal: Coping ability will improve Outcome: Not Progressing   

## 2023-08-08 DIAGNOSIS — F332 Major depressive disorder, recurrent severe without psychotic features: Secondary | ICD-10-CM | POA: Diagnosis not present

## 2023-08-08 NOTE — Progress Notes (Signed)
[facility] MD Progress Note  08/08/2023  Karina Freeman  MRN:  [id number]  Subjective:    Patient is a 73 year old female presenting to [facility] ED under IVC. Per triage note Pt husband and daughter left pt at home to go run errands and found that pt right ear was bleeding when they returned home and found a wire clothes hanger on the bathroom floor.  Patient was evaluated in the emergency room and admitted to [facility] due to psych unit.  Patient was admitted to the unit in October 2020 for for depression and suicide attempt by overdose.   Chart reviewed, case discussed in multidisciplinary meeting, patient seen during rounds.  Per staff report patient is doing better on the unit.  On examination, patient reports "fine" mood today. Reports appetite and sleep has improved. Patient reports her thoughts are "better". Her responses are little more spontaneous.She has been more visible on the unit, later was observed coloring in the day areas with peers.  Patient reports tolerating Zoloft well without side effects.  Principal Problem: MDD (major depressive disorder) Diagnosis: Principal Problem:   MDD (major depressive disorder)    Past Psychiatric History:   Prev Dx/Sx: Depression and anxiety Current Psych Provider: Unable to recall Home Meds (current): None currently Previous Med Trials: Zoloft and trazodone Therapy: None currently   Prior Psych Hospitalization: Was recently discharged from [facility] in October Prior Self Harm: Yes did overdose on medicine Prior Violence: Denies   Family Psych History: Brother with bipolar disorder Family Hx suicide: Bio brother died by suicide  Past Medical History:  Past Medical History:  Diagnosis Date   Abnormal Pap smear of cervix 11/11/2015   Acute HFrEF (heart failure with reduced ejection fraction) (HCC) 02/22/2023   Acute ST elevation myocardial infarction (STEMI) of anterior wall (HCC) 02/15/2023   Anxiety    CVA (cerebral infarction)    Depressive  disorder    GERD (gastroesophageal reflux disease)    Hyperlipidemia    Hypertension    IFG (impaired fasting glucose)    Osteoporosis    ST elevation myocardial infarction (STEMI) (HCC) 02/17/2023    Past Surgical History:  Procedure Laterality Date   APPENDECTOMY     CORONARY IMAGING/OCT N/A 02/21/2023   Procedure: CORONARY IMAGING/OCT;  Surgeon: Yvonne Kendall, MD;  Location: [facility] INVASIVE CV LAB;  Service: Cardiovascular;  Laterality: N/A;   CORONARY PRESSURE/FFR STUDY N/A 02/21/2023   Procedure: CORONARY PRESSURE/FFR STUDY;  Surgeon: Yvonne Kendall, MD;  Location: [facility] INVASIVE CV LAB;  Service: Cardiovascular;  Laterality: N/A;   CORONARY STENT INTERVENTION N/A 02/21/2023   Procedure: CORONARY STENT INTERVENTION;  Surgeon: Yvonne Kendall, MD;  Location: [facility] INVASIVE CV LAB;  Service: Cardiovascular;  Laterality: N/A;   CORONARY/GRAFT ACUTE MI REVASCULARIZATION N/A 02/15/2023   Procedure: Coronary/Graft Acute MI Revascularization;  Surgeon: Iran Ouch, MD;  Location: [facility] INVASIVE CV LAB;  Service: Cardiovascular;  Laterality: N/A;   LEFT HEART CATH AND CORONARY ANGIOGRAPHY N/A 02/15/2023   Procedure: LEFT HEART CATH AND CORONARY ANGIOGRAPHY;  Surgeon: Iran Ouch, MD;  Location: [facility] INVASIVE CV LAB;  Service: Cardiovascular;  Laterality: N/A;   LEFT HEART CATH AND CORONARY ANGIOGRAPHY N/A 02/21/2023   Procedure: LEFT HEART CATH AND CORONARY ANGIOGRAPHY;  Surgeon: Yvonne Kendall, MD;  Location: [facility] INVASIVE CV LAB;  Service: Cardiovascular;  Laterality: N/A;   TONSILLECTOMY     TUBAL LIGATION     Family History:  Family History  Problem Relation Age of Onset   Heart disease  Mother    Cancer Mother        breast   Heart disease Father    Heart disease Brother    Bipolar disorder Brother    Heart disease Sister    Breast cancer Neg Hx     Social History:  Social History   Substance and Sexual Activity  Alcohol Use No   Alcohol/week: 0.0 standard drinks of  alcohol     Social History   Substance and Sexual Activity  Drug Use No    Social History   Socioeconomic History   Marital status: Married    Spouse name: [name]   Number of children: 3   Years of education: Not on file   Highest education level: Not on file  Occupational History   Not on file  Tobacco Use   Smoking status: Former    Current packs/day: 0.00    Types: Cigarettes    Quit date: 07/16/1973    Years since quitting: 50.0   Smokeless tobacco: Never  Vaping Use   Vaping status: Never Used  Substance and Sexual Activity   Alcohol use: No    Alcohol/week: 0.0 standard drinks of alcohol   Drug use: No   Sexual activity: Yes  Other Topics Concern   Not on file  Social History Narrative   Not on file   Social Drivers of Health   Financial Resource Strain: Not on file  Food Insecurity: No Food Insecurity (08/03/2023)   Hunger Vital Sign    Worried About Running Out of Food in the Last Year: Never true    Ran Out of Food in the Last Year: Never true  Transportation Needs: No Transportation Needs (08/03/2023)   PRAPARE - Administrator, Civil Service (Medical): No    Lack of Transportation (Non-Medical): No  Physical Activity: Not on file  Stress: Not on file  Social Connections: Moderately Integrated (08/03/2023)   Social Connection and Isolation Panel [NHANES]    Frequency of Communication with Friends and Family: Once a week    Frequency of Social Gatherings with Friends and Family: Once a week    Attends Religious Services: 1 to 4 times per year    Active Member of [employer] or Organizations: Yes    Attends Banker Meetings: 1 to 4 times per year    Marital Status: Married                            Current Medications: Current Facility-Administered Medications  Medication Dose Route Frequency Provider Last Rate Last Admin   acetaminophen (TYLENOL) tablet 650 mg  650 mg Oral Q6H PRN Jearld Lesch, NP   650 mg at 08/04/23  0148   alum & mag hydroxide-simeth (MAALOX/MYLANTA) 200-200-20 MG/5ML suspension 30 mL  30 mL Oral Q4H PRN Jearld Lesch, NP       aspirin chewable tablet 81 mg  81 mg Oral Daily Dixon, Rashaun M, NP   81 mg at 08/08/23 1047   atorvastatin (LIPITOR) tablet 80 mg  80 mg Oral Daily Dixon, Rashaun M, NP   80 mg at 08/08/23 1047   carvedilol (COREG) tablet 12.5 mg  12.5 mg Oral BID WC Dixon, Rashaun M, NP   12.5 mg at 08/08/23 1735   clonazePAM (KLONOPIN) tablet 0.5 mg  0.5 mg Oral Daily Lewanda Rife, MD   0.5 mg at 08/08/23 1047   haloperidol (HALDOL) tablet 5 mg  5 mg Oral TID PRN Jearld Lesch, NP       And   diphenhydrAMINE (BENADRYL) capsule 50 mg  50 mg Oral TID PRN Jearld Lesch, NP       haloperidol lactate (HALDOL) injection 5 mg  5 mg Intramuscular TID PRN Jearld Lesch, NP       And   diphenhydrAMINE (BENADRYL) injection 50 mg  50 mg Intramuscular TID PRN Jearld Lesch, NP       And   LORazepam (ATIVAN) injection 2 mg  2 mg Intramuscular TID PRN Jearld Lesch, NP       haloperidol lactate (HALDOL) injection 10 mg  10 mg Intramuscular TID PRN Jearld Lesch, NP       And   diphenhydrAMINE (BENADRYL) injection 50 mg  50 mg Intramuscular TID PRN Jearld Lesch, NP       And   LORazepam (ATIVAN) injection 2 mg  2 mg Intramuscular TID PRN Jearld Lesch, NP       feeding supplement (ENSURE ENLIVE / ENSURE PLUS) liquid 237 mL  237 mL Oral BID BM Massengill, Harrold Donath, MD   237 mL at 08/07/23 1405   hydrOXYzine (ATARAX) tablet 25 mg  25 mg Oral TID PRN Jearld Lesch, NP       losartan (COZAAR) tablet 50 mg  50 mg Oral Daily Dixon, Rashaun M, NP   50 mg at 08/08/23 1047   magnesium hydroxide (MILK OF MAGNESIA) suspension 30 mL  30 mL Oral Daily PRN Jearld Lesch, NP       multivitamin with minerals tablet 1 tablet  1 tablet Oral Daily Massengill, Nathan, MD   1 tablet at 08/08/23 1047   sertraline (ZOLOFT) tablet 75 mg  75 mg Oral Daily Massengill, Harrold Donath, MD    75 mg at 08/08/23 0754   ticagrelor (BRILINTA) tablet 90 mg  90 mg Oral BID Lerry Liner M, NP   90 mg at 08/08/23 1047   traZODone (DESYREL) tablet 50 mg  50 mg Oral QHS PRN Jearld Lesch, NP   50 mg at 08/06/23 2109    Lab Results: No results found for this or any previous visit (from the past 48 hours).  Blood Alcohol level:  Lab Results  Component Value Date   ETH <10 08/02/2023   ETH <10 05/03/2023    Metabolic Disorder Labs: Lab Results  Component Value Date   HGBA1C 5.4 02/15/2023   MPG 108.28 02/15/2023   No results found for: "PROLACTIN" Lab Results  Component Value Date   CHOL 125 03/14/2023   TRIG 86 03/14/2023   HDL 52 03/14/2023   CHOLHDL 2.4 03/14/2023   VLDL 17 02/15/2023   LDLCALC 56 03/14/2023   LDLCALC 164 (H) 02/15/2023     Musculoskeletal: Strength & Muscle Tone: WNL Gait & Station: Normal Patient leans: NA   Psychiatric Specialty Exam:   Presentation  General Appearance:  Appropriate   Eye Contact: Fair   Speech: Little improvement   Speech Volume: Decreased   Handedness: Right     Mood and Affect  Mood: "Fine"   Affect: Restricted     Thought Process  Thought Processes: Linear   Descriptions of Associations:Intact   Orientation:Full (Time, Place and Person)   Thought Content:Logical   History of Schizophrenia/Schizoaffective disorder:No   Duration of Psychotic Symptoms:NA Hallucinations:Hallucinations: None   Ideas of Reference:None   Suicidal Thoughts::Denies   Homicidal Thoughts:Homicidal Thoughts: No     Sensorium  Memory: Fair   Judgment: Impaired   Insight: Lacking     Executive Functions  Concentration: Fair   Attention Span: Poor   Recall: Poor   Fund of Knowledge: Fair   Language: Fair     Psychomotor Activity  Psychomotor Activity: Psychomotor Activity: Normal     Assets  Assets: Desire for Improvement; Resilience; Social Support     Sleep  Sleep: Sleep:  Poor       Physical Exam: Physical Exam Vitals reviewed.  Constitutional:      General: She is not in acute distress.    Appearance: She is not toxic-appearing.  Pulmonary:     Effort: Pulmonary effort is normal.  Neurological:     Mental Status: She is alert.  Psychiatric:        Behavior: Behavior normal.      Review of Systems  Constitutional:  Negative for chills and fever.  Cardiovascular:  Negative for chest pain and palpitations.  Neurological:  Negative for dizziness, tingling, tremors and headaches.  Psychiatric/Behavioral:  Positive for depression. Negative for hallucinations, memory loss, substance abuse and suicidal ideas.   All other systems reviewed and are negative.  Blood pressure 116/75, pulse 70, temperature 97.7 F (36.5 C), resp. rate 18, height 5\' 7"  (1.702 m), weight 64.6 kg, SpO2 97%. Body mass index is 22.32 kg/m.   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 Psychiatric Diagnoses and Treatment:    -Increased Zoloft from 50 mg once daily to 75 mg once daily for depression. 08/05/23   -Bactrim for UTI  -- Encouraged patient to participate in unit milieu and in scheduled group therapies     3. Medical Issues Being Addressed:    On Brilinta  for MI   4. Discharge Planning:   -- Social work and case management to assist with discharge planning and identification of hospital follow-up needs prior to discharge  -- Estimated LOS: 4-5 days  -- Discharge Goals: Return home with outpatient referrals for mental health follow-up including medication management/psychotherapy   Lewanda Rife, MD

## 2023-08-08 NOTE — Plan of Care (Signed)
  Problem: Education: Goal: Ability to make informed decisions regarding treatment will improve Outcome: Progressing   Problem: Coping: Goal: Coping ability will improve Outcome: Progressing   Problem: Health Behavior/Discharge Planning: Goal: Identification of resources available to assist in meeting health care needs will improve Outcome: Progressing   Problem: Medication: Goal: Compliance with prescribed medication regimen will improve Outcome: Progressing   Problem: Self-Concept: Goal: Ability to disclose and discuss suicidal ideas will improve Outcome: Progressing   

## 2023-08-08 NOTE — Progress Notes (Signed)
Patient admitted IVC on 08/03/23 to [facility] for what appeared to be an attempted suicide via a wire clothes hanger.  Patient is minimal in speech and interaction. She denies SI/HI/AVH. Patient prefers to have medications adm 1 pill at a time whole. She isolates in her room but will appear for meals. Patient continues to refuse having an EKG taken. Provider is aware.  Q15 minute unit checks in place.

## 2023-08-08 NOTE — Group Note (Signed)
Recreation Therapy Group Note   Group Topic:Health and Wellness  Group Date: 08/08/2023 Start Time: 1100 End Time: 1140 Facilitators: Rosina Lowenstein, LRT, CTRS Location:  Dayroom  Group Description: Seated Exercise. LRT discussed the mental and physical benefits of exercise. LRT and group discussed how physical activity can be used as a coping skill. Pt's and LRT followed along to an exercise video on the TV screen that provided a visual representation and audio description of every exercise performed. Pt's encouraged to listen to their bodies and stop at any time if they experience feelings of discomfort or pain. Pts were encouraged to drink water and stay hydrated.   Goal Area(s) Addressed: Patient will learn benefits of physical activity. Patient will identify exercise as a coping skill.  Patient will follow multistep directions. Patient will try a new leisure interest.    Affect/Mood: N/A   Participation Level: Did not attend    Clinical Observations/Individualized Feedback: Patient did not attend group.   Plan: Continue to engage patient in RT group sessions 2-3x/week.   [address] LRT, CTRS 08/08/2023 2:02 PM

## 2023-08-08 NOTE — Plan of Care (Signed)
  Problem: Education: Goal: Ability to make informed decisions regarding treatment will improve Outcome: Not Progressing   Problem: Coping: Goal: Coping ability will improve Outcome: Not Progressing   

## 2023-08-08 NOTE — Progress Notes (Signed)
   08/07/23 2000  Psych Admission Type (Psych Patients Only)  Admission Status Involuntary  Psychosocial Assessment  Patient Complaints Depression  Eye Contact Fair  Facial Expression Flat  Affect Flat  Speech Soft  Interaction Guarded  Motor Activity Slow  Appearance/Hygiene Unremarkable  Behavior Characteristics Cooperative  Mood Depressed  Thought Process  Coherency WDL  Content WDL  Delusions None reported or observed  Perception WDL  Hallucination None reported or observed  Judgment Impaired  Confusion None  Danger to Self  Current suicidal ideation? Denies   Pt is alert and oriented. Pt remain in bed throughout the shift with eyes closed. RR even and unlabored. Po med compliant. ABT therapy maintained for UTI. No adverse reaction noted. Did not attend group. Denies SI/HI/AVH. Q 15 min checks provided. No c/o pain/discomfort noted.

## 2023-08-08 NOTE — Group Note (Signed)
Date:  08/08/2023 Time:  8:14 PM  Group Topic/Focus:  Personal Choices and Values:   The focus of this group is to help patients assess and explore the importance of values in their lives, how their values affect their decisions, how they express their values and what opposes their expression.    Participation Level:  Active  Participation Quality:  Appropriate  Affect:  Appropriate  Cognitive:  Appropriate  Insight: Good  Engagement in Group:  Engaged  Modes of Intervention:  Discussion  Additional Comments:    Burt Ek 08/08/2023, 8:14 PM

## 2023-08-08 NOTE — Group Note (Signed)
Recreation Therapy Group Note   Group Topic:Emotion Expression  Group Date: 08/08/2023 Start Time: 1500 End Time: 1600 Facilitators: Rosina Lowenstein, LRT, CTRS Location:  Dayroom  Group Description: Painting. LRT passed out blank canvases with masking tape covering the canvas in different random patterns. Pts were provided with watercolor paint, paintbrushes, water cups, and paper towels. Pts were encouraged to paint the canvas and then remove the paint once the canvas is dry. LRT and pts discussed how art is a healthy way to express yourself and can serve as a coping skill.   Goal Area(s) Addressed:  Patient will identify a new coping skill. Patient will participate in a leisure activity.  Patient will express their emotions through art.    Affect/Mood: Appropriate and Flat   Participation Level: Active   Participation Quality: Independent   Behavior: Appropriate, Calm, and Cooperative   Speech/Thought Process: Coherent   Insight: Fair   Judgement: Fair    Modes of Intervention: Art, Education, Exploration, Guided Discussion, and Open Conversation   Patient Response to Interventions:  Receptive   Education Outcome:  Acknowledges education   Clinical Observations/Individualized Feedback: Maribela was mostly active in their participation of session activities and group discussion. Pt shared that she doesn't usually paint but colors often. Pt minimally interacted with LRT and peers while present in group.    Plan: Continue to engage patient in RT group sessions 2-3x/week.   [address], LRT, CTRS 08/08/2023 5:20 PM

## 2023-08-09 DIAGNOSIS — F332 Major depressive disorder, recurrent severe without psychotic features: Secondary | ICD-10-CM | POA: Diagnosis not present

## 2023-08-09 NOTE — Group Note (Signed)
LCSW Group Therapy Note  Group Date: 08/08/2023 Start Time: 1300 End Time: 1330   Type of Therapy and Topic:  Group Therapy - Healthy vs Unhealthy Coping Skills  Participation Level:  Did Not Attend   Description of Group The focus of this group was to determine what unhealthy coping techniques typically are used by group members and what healthy coping techniques would be helpful in coping with various problems. Patients were guided in becoming aware of the differences between healthy and unhealthy coping techniques. Patients were asked to identify 2-3 healthy coping skills they would like to learn to use more effectively.  Therapeutic Goals Patients learned that coping is what human beings do all day long to deal with various situations in their lives Patients defined and discussed healthy vs unhealthy coping techniques Patients identified their preferred coping techniques and identified whether these were healthy or unhealthy Patients determined 2-3 healthy coping skills they would like to become more familiar with and use more often. Patients provided support and ideas to each other   Summary of Patient Progress: X  Therapeutic Modalities Cognitive Behavioral Therapy Motivational Interviewing  [name], [place] 08/09/2023  1:46 PM

## 2023-08-09 NOTE — Progress Notes (Addendum)
   08/09/23 2100  Psych Admission Type (Psych Patients Only)  Admission Status Involuntary  Psychosocial Assessment  Patient Complaints Malaise  Eye Contact Fair  Facial Expression Flat  Affect Flat  Speech Soft;Slow  Interaction Minimal  Appearance/Hygiene Unremarkable  Behavior Characteristics Cooperative  Mood Anxious;Fearful  Thought Process  Coherency WDL  Content WDL  Delusions None reported or observed  Perception WDL  Hallucination None reported or observed  Judgment Impaired  Confusion None  Danger to Self  Current suicidal ideation? Denies  Danger to Others  Danger to Others None reported or observed   Progress note   D: Pt seen in room. Pt denies SI, HI, AVH. Pt rates pain  0/10. Pt very soft-spoken. Flat affect. States she hasn't felt good today but doesn't specify whether physically or mentally. Depressed mood. States she spoke to her husband today and conversation went well. Minimal. No other concerns noted at this time.  A: Pt provided support and encouragement. Pt given scheduled medication as prescribed. PRNs as appropriate. Q15 min checks for safety.   R: Pt safe on the unit. Will continue to monitor.

## 2023-08-09 NOTE — Progress Notes (Signed)
[facility] MD Progress Note  08/09/2023  Karina Freeman  MRN:  [id number]  Subjective:    Patient is a 73 year old female presenting to [facility] ED under IVC. Per triage note Pt husband and daughter left pt at home to go run errands and found that pt right ear was bleeding when they returned home and found a wire clothes hanger on the bathroom floor.  Patient was evaluated in the emergency room and admitted to [facility] due to psych unit.  Patient was admitted to the unit in October 2020 for for depression and suicide attempt by overdose.   Chart reviewed, case discussed in multidisciplinary meeting, patient seen during rounds.  Per staff report patient is doing better on the unit.  Reportedly patient is more active and visible on the unit during afternoon hours.  During assessment today, patient reports "fine" mood. Reports appetite and sleep has improved.Her responses are  more spontaneous.She has been more visible on the unit. Patient said she had a good visit with her husband yesterday.Patient reports tolerating Zoloft well without side effects.  Principal Problem: MDD (major depressive disorder) Diagnosis: Principal Problem:   MDD (major depressive disorder)    Past Psychiatric History:   Prev Dx/Sx: Depression and anxiety Current Psych Provider: Unable to recall Home Meds (current): None currently Previous Med Trials: Zoloft and trazodone Therapy: None currently   Prior Psych Hospitalization: Was recently discharged from [facility] in October Prior Self Harm: Yes did overdose on medicine Prior Violence: Denies   Family Psych History: Brother with bipolar disorder Family Hx suicide: Bio brother died by suicide  Past Medical History:  Past Medical History:  Diagnosis Date   Abnormal Pap smear of cervix 11/11/2015   Acute HFrEF (heart failure with reduced ejection fraction) (HCC) 02/22/2023   Acute ST elevation myocardial infarction (STEMI) of anterior wall (HCC) 02/15/2023   Anxiety    CVA  (cerebral infarction)    Depressive disorder    GERD (gastroesophageal reflux disease)    Hyperlipidemia    Hypertension    IFG (impaired fasting glucose)    Osteoporosis    ST elevation myocardial infarction (STEMI) (HCC) 02/17/2023    Past Surgical History:  Procedure Laterality Date   APPENDECTOMY     CORONARY IMAGING/OCT N/A 02/21/2023   Procedure: CORONARY IMAGING/OCT;  Surgeon: Yvonne Kendall, MD;  Location: [facility] INVASIVE CV LAB;  Service: Cardiovascular;  Laterality: N/A;   CORONARY PRESSURE/FFR STUDY N/A 02/21/2023   Procedure: CORONARY PRESSURE/FFR STUDY;  Surgeon: Yvonne Kendall, MD;  Location: [facility] INVASIVE CV LAB;  Service: Cardiovascular;  Laterality: N/A;   CORONARY STENT INTERVENTION N/A 02/21/2023   Procedure: CORONARY STENT INTERVENTION;  Surgeon: Yvonne Kendall, MD;  Location: [facility] INVASIVE CV LAB;  Service: Cardiovascular;  Laterality: N/A;   CORONARY/GRAFT ACUTE MI REVASCULARIZATION N/A 02/15/2023   Procedure: Coronary/Graft Acute MI Revascularization;  Surgeon: Iran Ouch, MD;  Location: [facility] INVASIVE CV LAB;  Service: Cardiovascular;  Laterality: N/A;   LEFT HEART CATH AND CORONARY ANGIOGRAPHY N/A 02/15/2023   Procedure: LEFT HEART CATH AND CORONARY ANGIOGRAPHY;  Surgeon: Iran Ouch, MD;  Location: [facility] INVASIVE CV LAB;  Service: Cardiovascular;  Laterality: N/A;   LEFT HEART CATH AND CORONARY ANGIOGRAPHY N/A 02/21/2023   Procedure: LEFT HEART CATH AND CORONARY ANGIOGRAPHY;  Surgeon: Yvonne Kendall, MD;  Location: [facility] INVASIVE CV LAB;  Service: Cardiovascular;  Laterality: N/A;   TONSILLECTOMY     TUBAL LIGATION     Family History:  Family History  Problem Relation Age  of Onset   Heart disease Mother    Cancer Mother        breast   Heart disease Father    Heart disease Brother    Bipolar disorder Brother    Heart disease Sister    Breast cancer Neg Hx     Social History:  Social History   Substance and Sexual Activity  Alcohol Use No    Alcohol/week: 0.0 standard drinks of alcohol     Social History   Substance and Sexual Activity  Drug Use No    Social History   Socioeconomic History   Marital status: Married    Spouse name: [name]   Number of children: 3   Years of education: Not on file   Highest education level: Not on file  Occupational History   Not on file  Tobacco Use   Smoking status: Former    Current packs/day: 0.00    Types: Cigarettes    Quit date: 07/16/1973    Years since quitting: 50.0   Smokeless tobacco: Never  Vaping Use   Vaping status: Never Used  Substance and Sexual Activity   Alcohol use: No    Alcohol/week: 0.0 standard drinks of alcohol   Drug use: No   Sexual activity: Yes  Other Topics Concern   Not on file  Social History Narrative   Not on file   Social Drivers of Health   Financial Resource Strain: Not on file  Food Insecurity: No Food Insecurity (08/03/2023)   Hunger Vital Sign    Worried About Running Out of Food in the Last Year: Never true    Ran Out of Food in the Last Year: Never true  Transportation Needs: No Transportation Needs (08/03/2023)   PRAPARE - Administrator, Civil Service (Medical): No    Lack of Transportation (Non-Medical): No  Physical Activity: Not on file  Stress: Not on file  Social Connections: Moderately Integrated (08/03/2023)   Social Connection and Isolation Panel [NHANES]    Frequency of Communication with Friends and Family: Once a week    Frequency of Social Gatherings with Friends and Family: Once a week    Attends Religious Services: 1 to 4 times per year    Active Member of [employer] or Organizations: Yes    Attends Banker Meetings: 1 to 4 times per year    Marital Status: Married                            Current Medications: Current Facility-Administered Medications  Medication Dose Route Frequency Provider Last Rate Last Admin   acetaminophen (TYLENOL) tablet 650 mg  650 mg Oral Q6H PRN  Jearld Lesch, NP   650 mg at 08/04/23 0148   alum & mag hydroxide-simeth (MAALOX/MYLANTA) 200-200-20 MG/5ML suspension 30 mL  30 mL Oral Q4H PRN Jearld Lesch, NP       aspirin chewable tablet 81 mg  81 mg Oral Daily Dixon, Rashaun M, NP   81 mg at 08/09/23 1028   atorvastatin (LIPITOR) tablet 80 mg  80 mg Oral Daily Dixon, Rashaun M, NP   80 mg at 08/09/23 1028   carvedilol (COREG) tablet 12.5 mg  12.5 mg Oral BID WC Dixon, Rashaun M, NP   12.5 mg at 08/09/23 0810   clonazePAM (KLONOPIN) tablet 0.5 mg  0.5 mg Oral Daily Lewanda Rife, MD   0.5 mg at 08/09/23 1028  haloperidol (HALDOL) tablet 5 mg  5 mg Oral TID PRN Jearld Lesch, NP       And   diphenhydrAMINE (BENADRYL) capsule 50 mg  50 mg Oral TID PRN Jearld Lesch, NP       haloperidol lactate (HALDOL) injection 5 mg  5 mg Intramuscular TID PRN Jearld Lesch, NP       And   diphenhydrAMINE (BENADRYL) injection 50 mg  50 mg Intramuscular TID PRN Jearld Lesch, NP       And   LORazepam (ATIVAN) injection 2 mg  2 mg Intramuscular TID PRN Jearld Lesch, NP       haloperidol lactate (HALDOL) injection 10 mg  10 mg Intramuscular TID PRN Jearld Lesch, NP       And   diphenhydrAMINE (BENADRYL) injection 50 mg  50 mg Intramuscular TID PRN Jearld Lesch, NP       And   LORazepam (ATIVAN) injection 2 mg  2 mg Intramuscular TID PRN Jearld Lesch, NP       feeding supplement (ENSURE ENLIVE / ENSURE PLUS) liquid 237 mL  237 mL Oral BID BM Massengill, Harrold Donath, MD   237 mL at 08/09/23 1029   hydrOXYzine (ATARAX) tablet 25 mg  25 mg Oral TID PRN Jearld Lesch, NP       losartan (COZAAR) tablet 50 mg  50 mg Oral Daily Dixon, Rashaun M, NP   50 mg at 08/09/23 1028   magnesium hydroxide (MILK OF MAGNESIA) suspension 30 mL  30 mL Oral Daily PRN Jearld Lesch, NP       multivitamin with minerals tablet 1 tablet  1 tablet Oral Daily Massengill, Nathan, MD   1 tablet at 08/09/23 1028   sertraline (ZOLOFT) tablet 75 mg   75 mg Oral Daily Massengill, Harrold Donath, MD   75 mg at 08/09/23 0810   ticagrelor (BRILINTA) tablet 90 mg  90 mg Oral BID Lerry Liner M, NP   90 mg at 08/09/23 1028   traZODone (DESYREL) tablet 50 mg  50 mg Oral QHS PRN Jearld Lesch, NP   50 mg at 08/06/23 2109    Lab Results: No results found for this or any previous visit (from the past 48 hours).  Blood Alcohol level:  Lab Results  Component Value Date   ETH <10 08/02/2023   ETH <10 05/03/2023    Metabolic Disorder Labs: Lab Results  Component Value Date   HGBA1C 5.4 02/15/2023   MPG 108.28 02/15/2023   No results found for: "PROLACTIN" Lab Results  Component Value Date   CHOL 125 03/14/2023   TRIG 86 03/14/2023   HDL 52 03/14/2023   CHOLHDL 2.4 03/14/2023   VLDL 17 02/15/2023   LDLCALC 56 03/14/2023   LDLCALC 164 (H) 02/15/2023     Musculoskeletal: Strength & Muscle Tone: WNL Gait & Station: Normal Patient leans: NA   Psychiatric Specialty Exam:   Presentation  General Appearance:  Appropriate   Eye Contact: Fair   Speech: Little improvement   Speech Volume: Decreased   Handedness: Right     Mood and Affect  Mood: "Fine"   Affect: Restricted     Thought Process  Thought Processes: Linear   Descriptions of Associations:Intact   Orientation:Full (Time, Place and Person)   Thought Content:Logical   History of Schizophrenia/Schizoaffective disorder:No   Duration of Psychotic Symptoms:NA Hallucinations:Hallucinations: None   Ideas of Reference:None   Suicidal Thoughts::Denies   Homicidal Thoughts:Homicidal Thoughts:  No     Sensorium  Memory: Fair   Judgment: Impaired   Insight: Lacking     Executive Functions  Concentration: Fair   Attention Span: Poor   Recall: Poor   Fund of Knowledge: Fair   Language: Fair     Psychomotor Activity  Psychomotor Activity: Psychomotor Activity: Normal     Assets  Assets: Desire for Improvement; Resilience;  Social Support     Sleep  Sleep: Sleep: Poor       Physical Exam: Physical Exam Vitals reviewed.  Constitutional:      General: She is not in acute distress.    Appearance: She is not toxic-appearing.  Pulmonary:     Effort: Pulmonary effort is normal.  Neurological:     Mental Status: She is alert.  Psychiatric:        Behavior: Behavior normal.      Review of Systems  Constitutional:  Negative for chills and fever.  Cardiovascular:  Negative for chest pain and palpitations.  Neurological:  Negative for dizziness, tingling, tremors and headaches.  Psychiatric/Behavioral:  Positive for depression. Negative for hallucinations, memory loss, substance abuse and suicidal ideas.   All other systems reviewed and are negative.  Blood pressure 137/75, pulse 72, temperature 98.1 F (36.7 C), resp. rate 18, height 5\' 7"  (1.702 m), weight 64.6 kg, SpO2 97%. Body mass index is 22.32 kg/m.   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 Psychiatric Diagnoses and Treatment:    -Increased Zoloft from 50 mg once daily to 75 mg once daily for depression. 08/05/23   -Bactrim for UTI  -- Encouraged patient to participate in unit milieu and in scheduled group therapies     3.  Medical Issues Being Addressed:    On Brilinta for MI   4. Discharge Planning:   -- Social work and case management to assist with discharge planning and identification of hospital follow-up needs prior to discharge  -- Estimated LOS: 4-5 days  -- Discharge Goals: Return home with outpatient referrals for mental health follow-up including medication management/psychotherapy   Lewanda Rife, MD

## 2023-08-09 NOTE — Progress Notes (Signed)
   08/09/23 0615  15 Minute Checks  Location Bedroom  Visual Appearance Calm  Behavior Sleeping  Sleep (Behavioral Health Patients Only)  Calculate sleep? (Click Yes once per 24 hr at 0600 safety check) Yes  Documented sleep last 24 hours 10.25

## 2023-08-09 NOTE — Plan of Care (Signed)
Problem: Education: Goal: Ability to make informed decisions regarding treatment will improve Outcome: Progressing   Problem: Coping: Goal: Coping ability will improve Outcome: Progressing   Problem: Medication: Goal: Compliance with prescribed medication regimen will improve Outcome: Progressing   Problem: Self-Concept: Goal: Ability to disclose and discuss suicidal ideas will improve Outcome: Progressing

## 2023-08-09 NOTE — Group Note (Signed)
Recreation Therapy Group Note   Group Topic:Coping Skills  Group Date: 08/09/2023 Start Time: 1500 End Time: 1600 Facilitators: Rosina Lowenstein, LRT, CTRS Location:  Dayroom  Group Description: Coping A-Z. LRT and patients engage in a guided discussion on what coping skills are and gave specific examples. LRT passed out a handout labeled Coping A-Z with blank spaces beside each letter. LRT prompted patients to come up with a coping skill for each of the letters. LRT and patients went over the handout and gave ideas for each letter if anyone had any blanks left on their paper. Patients kept this handout with them that listed 26 different coping skills.   Goal Area(s) Addressed: Patients will be able to define "coping skills". Patient will identify new coping skills.  Patient will increase communication.   Affect/Mood: N/A   Participation Level: Did not attend    Clinical Observations/Individualized Feedback: Wednesday did not attend group.   Plan: Continue to engage patient in RT group sessions 2-3x/week.   Rosina Lowenstein, LRT, CTRS 08/09/2023 4:45 PM

## 2023-08-09 NOTE — Plan of Care (Signed)
D: Pt alert and oriented. Pt reports experiencing anxiety however, denies experiencing any depression at this time. Pt denies experiencing any pain at this time. Pt denies experiencing any SI/HI, or AVH at this time.   A: Scheduled medications administered to pt, per MD orders. Support and encouragement provided. Frequent verbal contact made. Routine safety checks conducted q15 minutes.   R: No adverse drug reactions noted. Pt verbally contracts for safety at this time. Pt compliant with medications and treatment plan. Pt interacts minimally with others on the unit. Pt remains safe at this time. Plan of care ongoing.  Pt refused 1400 ensure, pt had not finished first ensure and did not want another one at this time.   Problem: Coping: Goal: Coping ability will improve Outcome: Not Progressing   Problem: Medication: Goal: Compliance with prescribed medication regimen will improve Outcome: Progressing

## 2023-08-09 NOTE — Group Note (Signed)
Recreation Therapy Group Note   Group Topic:Leisure Education  Group Date: 08/09/2023 Start Time: 1100 End Time: 1135 Facilitators: Rosina Lowenstein, LRT, CTRS Location:  Dayroom  Group Description: Music. Patients encouraged to name their favorite song(s) for LRT to play song through speaker for group to hear. Patient educated on the definition of leisure and the importance of having different leisure interests outside of the hospital. Group discussed how leisure activities can often be used as Pharmacologist and that listening to music is one example.   Goal Area(s) Addressed:  Patient will identify a current leisure interest.  Patient will practice making a positive decision. Patient will have the opportunity to try a new leisure activity.   Affect/Mood: N/A   Participation Level: Did not attend    Clinical Observations/Individualized Feedback: Patient did not attend group.   Plan: Continue to engage patient in RT group sessions 2-3x/week.   Rosina Lowenstein, LRT, CTRS 08/09/2023 1:39 PM

## 2023-08-09 NOTE — Progress Notes (Signed)
   08/08/23 2100  Psych Admission Type (Psych Patients Only)  Admission Status Involuntary  Psychosocial Assessment  Patient Complaints Depression  Eye Contact Fair  Facial Expression Flat  Affect Flat  Speech Soft  Interaction Guarded;Minimal  Motor Activity Slow  Appearance/Hygiene Unremarkable  Behavior Characteristics Cooperative  Mood Sad;Depressed  Thought Process  Coherency WDL  Content WDL  Delusions None reported or observed  Perception WDL  Hallucination None reported or observed  Judgment Impaired  Confusion None  Danger to Self  Current suicidal ideation? Denies

## 2023-08-09 NOTE — Group Note (Signed)
Date:  08/09/2023 Time:  8:44 PM  Group Topic/Focus:  Wrap-Up Group:   The focus of this group is to help patients review their daily goal of treatment and discuss progress on daily workbooks.    Participation Level:  Active  Participation Quality:  Appropriate  Affect:  Appropriate  Cognitive:  Appropriate  Insight: Appropriate  Engagement in Group:  Engaged  Modes of Intervention:  Discussion    Lenore Cordia 08/09/2023, 8:44 PM

## 2023-08-09 NOTE — Group Note (Deleted)
Recreation Therapy Group Note   Group Topic:Leisure Education  Group Date: 08/09/2023 Start Time: 1000 End Time: 1100 Facilitators: Rosina Lowenstein, LRT Location:  Craft Room  Group Description: Leisure. Patients were given the option to choose from singing karaoke, coloring mandalas, using oil pastels, journaling, or playing with play-doh. LRT and pts discussed the meaning of leisure, the importance of participating in leisure during their free time/when they're outside of the hospital, as well as how our leisure interests can also serve as coping skills.   Goal Area(s) Addressed:  Patient will identify a current leisure interest.  Patient will learn the definition of "leisure". Patient will practice making a positive decision. Patient will have the opportunity to try a new leisure activity. Patient will communicate with peers and LRT.        Affect/Mood: {RT BHH Affect/Mood:26271}   Participation Level: {RT BHH Participation Level:26267}   Participation Quality: {RT BHH Participation Quality:26268}   Behavior: {RT BHH Group Behavior:26269}   Speech/Thought Process: {RT BHH Speech/Thought:26276}   Insight: {RT BHH Insight:26272}   Judgement: {RT BHH Judgement:26278}   Modes of Intervention: {RT BHH Modes of Intervention:26277}   Patient Response to Interventions:  {RT BHH Patient Response to Intervention:26274}   Education Outcome:  {RT BHH Education Outcome:26279}   Clinical Observations/Individualized Feedback: *** was *** in their participation of session activities and group discussion. Pt identified ***   Plan: {RT BHH Tx UUVO:53664}   Rosina Lowenstein, LRT,  08/09/2023 12:06 PM

## 2023-08-10 DIAGNOSIS — F332 Major depressive disorder, recurrent severe without psychotic features: Secondary | ICD-10-CM | POA: Diagnosis not present

## 2023-08-10 MED ORDER — SERTRALINE HCL 50 MG PO TABS
100.0000 mg | ORAL_TABLET | Freq: Every day | ORAL | Status: DC
  Administered 2023-08-11 – 2023-08-28 (×18): 100 mg via ORAL
  Filled 2023-08-10 (×18): qty 2

## 2023-08-10 NOTE — Plan of Care (Signed)
D: Pt alert and oriented. Pt reports experiencing anxiety however denies experiencing any depression at this time. Pt denies experiencing any pain at this time. Pt denies experiencing any SI/HI, or AVH at this time.   A: Scheduled medications administered to pt, per MD orders. Support and encouragement provided. Frequent verbal contact made. Routine safety checks conducted q15 minutes.   R: No adverse drug reactions noted. Pt verbally contracts for safety at this time. Pt compliant with medications. Pt interacts minimally with others on the unit mostly self isolating to her room. Pt must be encouraged to come out for meals. Pt remains safe at this time. Plan of care ongoing.  Pt refused ensures today. Pt had not finished all of ensure from yesterday morning. Pt encouraged to be more present in dayroom today.   Problem: Coping: Goal: Coping ability will improve Outcome: Not Progressing   Problem: Self-Concept: Goal: Will verbalize positive feelings about self Outcome: Not Progressing

## 2023-08-10 NOTE — Group Note (Signed)
Date:  08/10/2023 Time:  9:27 PM  Group Topic/Focus:  Coping    Participation Level:  Active  Participation Quality:  Appropriate  Affect:  Appropriate  Cognitive:  Appropriate  Insight: Good  Engagement in Group:  Supportive  Modes of Intervention:  Support  Additional Comments:    Lorenda Ishihara 08/10/2023, 9:27 PM

## 2023-08-10 NOTE — Plan of Care (Signed)
Problem: Coping: Goal: Coping ability will improve Outcome: Progressing

## 2023-08-10 NOTE — Progress Notes (Signed)
[facility] MD Progress Note  08/10/2023  Karina Freeman  MRN:  [id number]  Subjective:    Patient is a 73 year old female presenting to [facility] ED under IVC. Per triage note Pt husband and daughter left pt at home to go run errands and found that pt right ear was bleeding when they returned home and found a wire clothes hanger on the bathroom floor.  Patient was evaluated in the emergency room and admitted to [facility] due to psych unit.  Patient was admitted to the unit in October 2020 for for depression and suicide attempt by overdose.   Chart reviewed, case discussed in multidisciplinary meeting, patient seen during rounds.  Per staff report patient is doing better on the unit.  Reportedly patient is more active and visible on the unit during afternoon hours.  During assessment today, patient reports "fine" mood. Reports appetite and sleep has improved.Her responses are  more spontaneous.She has been more visible on the unit. Patient said she had a good visit with her husband yesterday.Patient reports tolerating Zoloft well without side effects. Patient said she made a comment to divorce her husband because she is upset with him for putting her in the hospital. Patient said "I didn't mean it, I don't want to divorce him, I was upset". We discussed the reasons why her husband was concerned about patient's safety and he did the right thing to seek help. Patient agreed with the reasons.  I spoke with patient's husband via phone today.  Husband reports that patient has  "good days and not good days".  He said that couple of days ago, during visitation the patient was talking about getting a divorce.  He mentioned that next day during visitation she was smiling and was  "better".  His questions were answered to his satisfaction.  He was provided with support and reassurance.  Principal Problem: MDD (major depressive disorder) Diagnosis: Principal Problem:   MDD (major depressive disorder)    Past Psychiatric  History:   Prev Dx/Sx: Depression and anxiety Current Psych Provider: Unable to recall Home Meds (current): None currently Previous Med Trials: Zoloft and trazodone Therapy: None currently   Prior Psych Hospitalization: Was recently discharged from [facility] in October Prior Self Harm: Yes did overdose on medicine Prior Violence: Denies   Family Psych History: Brother with bipolar disorder Family Hx suicide: Bio brother died by suicide  Past Medical History:  Past Medical History:  Diagnosis Date   Abnormal Pap smear of cervix 11/11/2015   Acute HFrEF (heart failure with reduced ejection fraction) (HCC) 02/22/2023   Acute ST elevation myocardial infarction (STEMI) of anterior wall (HCC) 02/15/2023   Anxiety    CVA (cerebral infarction)    Depressive disorder    GERD (gastroesophageal reflux disease)    Hyperlipidemia    Hypertension    IFG (impaired fasting glucose)    Osteoporosis    ST elevation myocardial infarction (STEMI) (HCC) 02/17/2023    Past Surgical History:  Procedure Laterality Date   APPENDECTOMY     CORONARY IMAGING/OCT N/A 02/21/2023   Procedure: CORONARY IMAGING/OCT;  Surgeon: Yvonne Kendall, MD;  Location: [facility] INVASIVE CV LAB;  Service: Cardiovascular;  Laterality: N/A;   CORONARY PRESSURE/FFR STUDY N/A 02/21/2023   Procedure: CORONARY PRESSURE/FFR STUDY;  Surgeon: Yvonne Kendall, MD;  Location: [facility] INVASIVE CV LAB;  Service: Cardiovascular;  Laterality: N/A;   CORONARY STENT INTERVENTION N/A 02/21/2023   Procedure: CORONARY STENT INTERVENTION;  Surgeon: Yvonne Kendall, MD;  Location: [facility] INVASIVE CV LAB;  Service: Cardiovascular;  Laterality: N/A;   CORONARY/GRAFT ACUTE MI REVASCULARIZATION N/A 02/15/2023   Procedure: Coronary/Graft Acute MI Revascularization;  Surgeon: Iran Ouch, MD;  Location: [facility] INVASIVE CV LAB;  Service: Cardiovascular;  Laterality: N/A;   LEFT HEART CATH AND CORONARY ANGIOGRAPHY N/A 02/15/2023   Procedure: LEFT HEART CATH AND  CORONARY ANGIOGRAPHY;  Surgeon: Iran Ouch, MD;  Location: [facility] INVASIVE CV LAB;  Service: Cardiovascular;  Laterality: N/A;   LEFT HEART CATH AND CORONARY ANGIOGRAPHY N/A 02/21/2023   Procedure: LEFT HEART CATH AND CORONARY ANGIOGRAPHY;  Surgeon: Yvonne Kendall, MD;  Location: [facility] INVASIVE CV LAB;  Service: Cardiovascular;  Laterality: N/A;   TONSILLECTOMY     TUBAL LIGATION     Family History:  Family History  Problem Relation Age of Onset   Heart disease Mother    Cancer Mother        breast   Heart disease Father    Heart disease Brother    Bipolar disorder Brother    Heart disease Sister    Breast cancer Neg Hx     Social History:  Social History   Substance and Sexual Activity  Alcohol Use No   Alcohol/week: 0.0 standard drinks of alcohol     Social History   Substance and Sexual Activity  Drug Use No    Social History   Socioeconomic History   Marital status: Married    Spouse name: [name]   Number of children: 3   Years of education: Not on file   Highest education level: Not on file  Occupational History   Not on file  Tobacco Use   Smoking status: Former    Current packs/day: 0.00    Types: Cigarettes    Quit date: 07/16/1973    Years since quitting: 50.1   Smokeless tobacco: Never  Vaping Use   Vaping status: Never Used  Substance and Sexual Activity   Alcohol use: No    Alcohol/week: 0.0 standard drinks of alcohol   Drug use: No   Sexual activity: Yes  Other Topics Concern   Not on file  Social History Narrative   Not on file   Social Drivers of Health   Financial Resource Strain: Not on file  Food Insecurity: No Food Insecurity (08/03/2023)   Hunger Vital Sign    Worried About Running Out of Food in the Last Year: Never true    Ran Out of Food in the Last Year: Never true  Transportation Needs: No Transportation Needs (08/03/2023)   PRAPARE - Administrator, Civil Service (Medical): No    Lack of Transportation  (Non-Medical): No  Physical Activity: Not on file  Stress: Not on file  Social Connections: Moderately Integrated (08/03/2023)   Social Connection and Isolation Panel [NHANES]    Frequency of Communication with Friends and Family: Once a week    Frequency of Social Gatherings with Friends and Family: Once a week    Attends Religious Services: 1 to 4 times per year    Active Member of [employer] or Organizations: Yes    Attends Banker Meetings: 1 to 4 times per year    Marital Status: Married                            Current Medications: Current Facility-Administered Medications  Medication Dose Route Frequency Provider Last Rate Last Admin   acetaminophen (TYLENOL) tablet 650 mg  650 mg Oral  Q6H PRN Jearld Lesch, NP   650 mg at 08/04/23 0148   alum & mag hydroxide-simeth (MAALOX/MYLANTA) 200-200-20 MG/5ML suspension 30 mL  30 mL Oral Q4H PRN Jearld Lesch, NP       aspirin chewable tablet 81 mg  81 mg Oral Daily Dixon, Rashaun M, NP   81 mg at 08/10/23 1055   atorvastatin (LIPITOR) tablet 80 mg  80 mg Oral Daily Dixon, Rashaun M, NP   80 mg at 08/10/23 1055   carvedilol (COREG) tablet 12.5 mg  12.5 mg Oral BID WC Dixon, Rashaun M, NP   12.5 mg at 08/10/23 0840   clonazePAM (KLONOPIN) tablet 0.5 mg  0.5 mg Oral Daily Lewanda Rife, MD   0.5 mg at 08/10/23 1056   haloperidol (HALDOL) tablet 5 mg  5 mg Oral TID PRN Jearld Lesch, NP       And   diphenhydrAMINE (BENADRYL) capsule 50 mg  50 mg Oral TID PRN Jearld Lesch, NP       haloperidol lactate (HALDOL) injection 5 mg  5 mg Intramuscular TID PRN Jearld Lesch, NP       And   diphenhydrAMINE (BENADRYL) injection 50 mg  50 mg Intramuscular TID PRN Jearld Lesch, NP       And   LORazepam (ATIVAN) injection 2 mg  2 mg Intramuscular TID PRN Jearld Lesch, NP       haloperidol lactate (HALDOL) injection 10 mg  10 mg Intramuscular TID PRN Jearld Lesch, NP       And   diphenhydrAMINE  (BENADRYL) injection 50 mg  50 mg Intramuscular TID PRN Jearld Lesch, NP       And   LORazepam (ATIVAN) injection 2 mg  2 mg Intramuscular TID PRN Durwin Nora, Rashaun M, NP       feeding supplement (ENSURE ENLIVE / ENSURE PLUS) liquid 237 mL  237 mL Oral BID BM Massengill, Harrold Donath, MD   237 mL at 08/09/23 1029   hydrOXYzine (ATARAX) tablet 25 mg  25 mg Oral TID PRN Jearld Lesch, NP       losartan (COZAAR) tablet 50 mg  50 mg Oral Daily Dixon, Rashaun M, NP   50 mg at 08/10/23 1055   magnesium hydroxide (MILK OF MAGNESIA) suspension 30 mL  30 mL Oral Daily PRN Jearld Lesch, NP       multivitamin with minerals tablet 1 tablet  1 tablet Oral Daily Massengill, Nathan, MD   1 tablet at 08/10/23 1054   sertraline (ZOLOFT) tablet 75 mg  75 mg Oral Daily Massengill, Harrold Donath, MD   75 mg at 08/10/23 0840   ticagrelor (BRILINTA) tablet 90 mg  90 mg Oral BID Lerry Liner M, NP   90 mg at 08/10/23 1055   traZODone (DESYREL) tablet 50 mg  50 mg Oral QHS PRN Jearld Lesch, NP   50 mg at 08/06/23 2109    Lab Results: No results found for this or any previous visit (from the past 48 hours).  Blood Alcohol level:  Lab Results  Component Value Date   ETH <10 08/02/2023   ETH <10 05/03/2023    Metabolic Disorder Labs: Lab Results  Component Value Date   HGBA1C 5.4 02/15/2023   MPG 108.28 02/15/2023   No results found for: "PROLACTIN" Lab Results  Component Value Date   CHOL 125 03/14/2023   TRIG 86 03/14/2023   HDL 52 03/14/2023   CHOLHDL 2.4 03/14/2023  VLDL 17 02/15/2023   LDLCALC 56 03/14/2023   LDLCALC 164 (H) 02/15/2023     Musculoskeletal: Strength & Muscle Tone: WNL Gait & Station: Normal Patient leans: NA   Psychiatric Specialty Exam:   Presentation  General Appearance:  Appropriate   Eye Contact: Fair   Speech: Little improvement   Speech Volume: Decreased   Handedness: Right     Mood and Affect  Mood: "Fine"   Affect: Restricted     Thought  Process  Thought Processes: Linear   Descriptions of Associations:Intact   Orientation:Full (Time, Place and Person)   Thought Content:Logical   History of Schizophrenia/Schizoaffective disorder:No   Duration of Psychotic Symptoms:NA Hallucinations:Hallucinations: None   Ideas of Reference:None   Suicidal Thoughts::Denies   Homicidal Thoughts:Homicidal Thoughts: No     Sensorium  Memory: Fair   Judgment: Limited   Insight: Shallow     Executive Functions  Concentration: Fair   Attention Span: Fair   Fund of Knowledge: Fair   Language: Fair     Psychomotor Activity  Psychomotor Activity: Psychomotor Activity: Normal     Assets  Assets: Desire for Improvement; Resilience; Social Support     Sleep  Sleep: Sleep:Fair       Physical Exam: Physical Exam Vitals reviewed.  Constitutional:      General: She is not in acute distress.    Appearance: She is not toxic-appearing.  Pulmonary:     Effort: Pulmonary effort is normal.  Neurological:     Mental Status: She is alert.  Psychiatric:        Behavior: Behavior normal.      Review of Systems  Constitutional:  Negative for chills and fever.  Cardiovascular:  Negative for chest pain and palpitations.  Neurological:  Negative for dizziness, tingling, tremors and headaches.    Blood pressure (!) 169/81, pulse 63, temperature (!) 97.4 F (36.3 C), resp. rate 18, height 5\' 7"  (1.702 m), weight 64.6 kg, SpO2 97%. Body mass index is 22.32 kg/m.   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 Psychiatric Diagnoses and Treatment:    -Increase Zoloft from 75 mg once daily to 100 mg once daily for depression. 08/11/23   -Bactrim for UTI  -- Encouraged patient to participate in unit milieu and in scheduled group therapies     3. Medical Issues Being Addressed:    On Brilinta for MI   4. Discharge Planning:   -- Social work and case management to assist with discharge planning and identification of hospital follow-up needs prior to discharge  -- Estimated LOS: 4-5 days  -- Discharge Goals: Return home with outpatient referrals for mental health follow-up including medication management/psychotherapy   Lewanda Rife, MD

## 2023-08-10 NOTE — Group Note (Signed)
[facility] LCSW Group Therapy Note    Group Date: 08/10/2023 Start Time: 1400 End Time: 1500  Type of Therapy and Topic:  Group Therapy:  Overcoming Obstacles  Participation Level:  [facility] PARTICIPATION LEVEL: Minimal  Mood:  Description of Group:   In this group patients will be encouraged to explore what they see as obstacles to their own wellness and recovery. They will be guided to discuss their thoughts, feelings, and behaviors related to these obstacles. The group will process together ways to cope with barriers, with attention given to specific choices patients can make. Each patient will be challenged to identify changes they are motivated to make in order to overcome their obstacles. This group will be process-oriented, with patients participating in exploration of their own experiences as well as giving and receiving support and challenge from other group members.  Therapeutic Goals: 1. Patient will identify personal and current obstacles as they relate to admission. 2. Patient will identify barriers that currently interfere with their wellness or overcoming obstacles.  3. Patient will identify feelings, thought process and behaviors related to these barriers. 4. Patient will identify two changes they are willing to make to overcome these obstacles:    Summary of Patient Progress   Patient participated in activity but did not speak or engage in conversation.    Therapeutic Modalities:   Cognitive Behavioral Therapy Solution Focused Therapy Motivational Interviewing Relapse Prevention Therapy   Starleen Arms, LCSW

## 2023-08-10 NOTE — Progress Notes (Signed)
   08/10/23 0600  15 Minute Checks  Location Bedroom  Visual Appearance Calm  Behavior Sleeping  Sleep (Behavioral Health Patients Only)  Calculate sleep? (Click Yes once per 24 hr at 0600 safety check) Yes  Documented sleep last 24 hours 11

## 2023-08-11 DIAGNOSIS — F332 Major depressive disorder, recurrent severe without psychotic features: Secondary | ICD-10-CM | POA: Diagnosis not present

## 2023-08-11 NOTE — Plan of Care (Signed)
Problem: Health Behavior/Discharge Planning: Goal: Identification of resources available to assist in meeting health care needs will improve Outcome: Not Progressing   Problem: Medication: Goal: Compliance with prescribed medication regimen will improve Outcome: Not Progressing

## 2023-08-11 NOTE — Group Note (Signed)
Date:  08/11/2023 Time:  11:25 PM  Group Topic/Focus:  Wrap-Up Group:   The focus of this group is to help patients review their daily goal of treatment and discuss progress on daily workbooks.    Participation Level:  Active  Participation Quality:  Appropriate  Affect:  Appropriate  Cognitive:  Appropriate  Insight: Good  Engagement in Group:  Engaged  Modes of Intervention:  Discussion  Additional Comments:    Maeola Harman 08/11/2023, 11:25 PM

## 2023-08-11 NOTE — Plan of Care (Signed)
Pt. Took meds. No issues reported. Pt. Denies SI&HI

## 2023-08-11 NOTE — Progress Notes (Signed)
[facility] MD Progress Note  08/11/2023  Karina Freeman  MRN:  [id number]   Patient is a 73 year old female presenting to [facility] ED under IVC. Per triage note Pt husband and daughter left pt at home to go run errands and found that pt right ear was bleeding when they returned home and found a wire clothes hanger on the bathroom floor.  Patient was evaluated in the emergency room and admitted to [facility] due to psych unit.  Patient was admitted to the unit in October 2020 for for depression and suicide attempt by overdose.   Subjective:   Chart reviewed, case discussed in multidisciplinary meeting, patient seen during rounds.  Per staff report patient is doing better on the unit.  Reportedly patient is more active and visible on the unit during afternoon hours.  During assessment today, patient reports "fine" mood. Reports appetite and sleep has improved.Her responses are  more spontaneous.  Patient reports that she feels depressed because she is not able to do things she enjoys.  Patient said" I want to do things  I was doing before, but I am not able to do certain things which makes me depressed".  We discussed change in lifestyle as we grow older.  Patient was provided with support and reassurance.  Patient was encouraged to consider individual therapy upon discharge.  08/09/24 I spoke with patient's husband via phone today.  Husband reports that patient has  "good days and not good days".  He said that couple of days ago, during visitation the patient was talking about getting a divorce.  He mentioned that next day during visitation she was smiling and was  "better".  His questions were answered to his satisfaction.  He was provided with support and reassurance.  Principal Problem: MDD (major depressive disorder) Diagnosis: Principal Problem:   MDD (major depressive disorder)    Past Psychiatric History:   Prev Dx/Sx: Depression and anxiety Current Psych Provider: Unable to recall Home Meds (current):  None currently Previous Med Trials: Zoloft and trazodone Therapy: None currently   Prior Psych Hospitalization: Was recently discharged from [facility] in October Prior Self Harm: Yes did overdose on medicine Prior Violence: Denies   Family Psych History: Brother with bipolar disorder Family Hx suicide: Bio brother died by suicide  Past Medical History:  Past Medical History:  Diagnosis Date   Abnormal Pap smear of cervix 11/11/2015   Acute HFrEF (heart failure with reduced ejection fraction) (HCC) 02/22/2023   Acute ST elevation myocardial infarction (STEMI) of anterior wall (HCC) 02/15/2023   Anxiety    CVA (cerebral infarction)    Depressive disorder    GERD (gastroesophageal reflux disease)    Hyperlipidemia    Hypertension    IFG (impaired fasting glucose)    Osteoporosis    ST elevation myocardial infarction (STEMI) (HCC) 02/17/2023    Past Surgical History:  Procedure Laterality Date   APPENDECTOMY     CORONARY IMAGING/OCT N/A 02/21/2023   Procedure: CORONARY IMAGING/OCT;  Surgeon: Yvonne Kendall, MD;  Location: [facility] INVASIVE CV LAB;  Service: Cardiovascular;  Laterality: N/A;   CORONARY PRESSURE/FFR STUDY N/A 02/21/2023   Procedure: CORONARY PRESSURE/FFR STUDY;  Surgeon: Yvonne Kendall, MD;  Location: [facility] INVASIVE CV LAB;  Service: Cardiovascular;  Laterality: N/A;   CORONARY STENT INTERVENTION N/A 02/21/2023   Procedure: CORONARY STENT INTERVENTION;  Surgeon: Yvonne Kendall, MD;  Location: [facility] INVASIVE CV LAB;  Service: Cardiovascular;  Laterality: N/A;   CORONARY/GRAFT ACUTE MI REVASCULARIZATION N/A 02/15/2023  Procedure: Coronary/Graft Acute MI Revascularization;  Surgeon: Iran Ouch, MD;  Location: [facility] INVASIVE CV LAB;  Service: Cardiovascular;  Laterality: N/A;   LEFT HEART CATH AND CORONARY ANGIOGRAPHY N/A 02/15/2023   Procedure: LEFT HEART CATH AND CORONARY ANGIOGRAPHY;  Surgeon: Iran Ouch, MD;  Location: [facility] INVASIVE CV LAB;  Service: Cardiovascular;   Laterality: N/A;   LEFT HEART CATH AND CORONARY ANGIOGRAPHY N/A 02/21/2023   Procedure: LEFT HEART CATH AND CORONARY ANGIOGRAPHY;  Surgeon: Yvonne Kendall, MD;  Location: [facility] INVASIVE CV LAB;  Service: Cardiovascular;  Laterality: N/A;   TONSILLECTOMY     TUBAL LIGATION     Family History:  Family History  Problem Relation Age of Onset   Heart disease Mother    Cancer Mother        breast   Heart disease Father    Heart disease Brother    Bipolar disorder Brother    Heart disease Sister    Breast cancer Neg Hx     Social History:  Social History   Substance and Sexual Activity  Alcohol Use No   Alcohol/week: 0.0 standard drinks of alcohol     Social History   Substance and Sexual Activity  Drug Use No    Social History   Socioeconomic History   Marital status: Married    Spouse name: [name]   Number of children: 3   Years of education: Not on file   Highest education level: Not on file  Occupational History   Not on file  Tobacco Use   Smoking status: Former    Current packs/day: 0.00    Types: Cigarettes    Quit date: 07/16/1973    Years since quitting: 50.1   Smokeless tobacco: Never  Vaping Use   Vaping status: Never Used  Substance and Sexual Activity   Alcohol use: No    Alcohol/week: 0.0 standard drinks of alcohol   Drug use: No   Sexual activity: Yes  Other Topics Concern   Not on file  Social History Narrative   Not on file   Social Drivers of Health   Financial Resource Strain: Not on file  Food Insecurity: No Food Insecurity (08/03/2023)   Hunger Vital Sign    Worried About Running Out of Food in the Last Year: Never true    Ran Out of Food in the Last Year: Never true  Transportation Needs: No Transportation Needs (08/03/2023)   PRAPARE - Administrator, Civil Service (Medical): No    Lack of Transportation (Non-Medical): No  Physical Activity: Not on file  Stress: Not on file  Social Connections: Moderately Integrated  (08/03/2023)   Social Connection and Isolation Panel [NHANES]    Frequency of Communication with Friends and Family: Once a week    Frequency of Social Gatherings with Friends and Family: Once a week    Attends Religious Services: 1 to 4 times per year    Active Member of [employer] or Organizations: Yes    Attends Banker Meetings: 1 to 4 times per year    Marital Status: Married                            Current Medications: Current Facility-Administered Medications  Medication Dose Route Frequency Provider Last Rate Last Admin   acetaminophen (TYLENOL) tablet 650 mg  650 mg Oral Q6H PRN Jearld Lesch, NP   650 mg at 08/04/23 0148  alum & mag hydroxide-simeth (MAALOX/MYLANTA) 200-200-20 MG/5ML suspension 30 mL  30 mL Oral Q4H PRN Jearld Lesch, NP       aspirin chewable tablet 81 mg  81 mg Oral Daily Dixon, Rashaun M, NP   81 mg at 08/11/23 1023   atorvastatin (LIPITOR) tablet 80 mg  80 mg Oral Daily Dixon, Rashaun M, NP   80 mg at 08/11/23 1024   carvedilol (COREG) tablet 12.5 mg  12.5 mg Oral BID WC Dixon, Rashaun M, NP   12.5 mg at 08/11/23 1024   clonazePAM (KLONOPIN) tablet 0.5 mg  0.5 mg Oral Daily Lewanda Rife, MD   0.5 mg at 08/11/23 1024   haloperidol (HALDOL) tablet 5 mg  5 mg Oral TID PRN Jearld Lesch, NP       And   diphenhydrAMINE (BENADRYL) capsule 50 mg  50 mg Oral TID PRN Jearld Lesch, NP       haloperidol lactate (HALDOL) injection 5 mg  5 mg Intramuscular TID PRN Jearld Lesch, NP       And   diphenhydrAMINE (BENADRYL) injection 50 mg  50 mg Intramuscular TID PRN Jearld Lesch, NP       And   LORazepam (ATIVAN) injection 2 mg  2 mg Intramuscular TID PRN Jearld Lesch, NP       haloperidol lactate (HALDOL) injection 10 mg  10 mg Intramuscular TID PRN Jearld Lesch, NP       And   diphenhydrAMINE (BENADRYL) injection 50 mg  50 mg Intramuscular TID PRN Jearld Lesch, NP       And   LORazepam (ATIVAN) injection 2  mg  2 mg Intramuscular TID PRN Durwin Nora, Rashaun M, NP       feeding supplement (ENSURE ENLIVE / ENSURE PLUS) liquid 237 mL  237 mL Oral BID BM Massengill, Harrold Donath, MD   237 mL at 08/09/23 1029   hydrOXYzine (ATARAX) tablet 25 mg  25 mg Oral TID PRN Jearld Lesch, NP       losartan (COZAAR) tablet 50 mg  50 mg Oral Daily Dixon, Rashaun M, NP   50 mg at 08/11/23 1024   magnesium hydroxide (MILK OF MAGNESIA) suspension 30 mL  30 mL Oral Daily PRN Jearld Lesch, NP       multivitamin with minerals tablet 1 tablet  1 tablet Oral Daily Massengill, Nathan, MD   1 tablet at 08/11/23 1024   sertraline (ZOLOFT) tablet 100 mg  100 mg Oral Daily Lewanda Rife, MD   100 mg at 08/11/23 1024   ticagrelor (BRILINTA) tablet 90 mg  90 mg Oral BID Lerry Liner M, NP   90 mg at 08/11/23 1024   traZODone (DESYREL) tablet 50 mg  50 mg Oral QHS PRN Jearld Lesch, NP   50 mg at 08/06/23 2109    Lab Results: No results found for this or any previous visit (from the past 48 hours).  Blood Alcohol level:  Lab Results  Component Value Date   ETH <10 08/02/2023   ETH <10 05/03/2023    Metabolic Disorder Labs: Lab Results  Component Value Date   HGBA1C 5.4 02/15/2023   MPG 108.28 02/15/2023   No results found for: "PROLACTIN" Lab Results  Component Value Date   CHOL 125 03/14/2023   TRIG 86 03/14/2023   HDL 52 03/14/2023   CHOLHDL 2.4 03/14/2023   VLDL 17 02/15/2023   LDLCALC 56 03/14/2023   LDLCALC 164 (H)  02/15/2023     Musculoskeletal: Strength & Muscle Tone: WNL Gait & Station: Normal Patient leans: NA   Psychiatric Specialty Exam:   Presentation  General Appearance:  Appropriate   Eye Contact: Fair   Speech: Little improvement   Speech Volume: Decreased   Handedness: Right     Mood and Affect  Mood: "Fine"   Affect: Restricted     Thought Process  Thought Processes: Linear   Descriptions of Associations:Intact   Orientation:Full (Time, Place and  Person)   Thought Content:Logical   History of Schizophrenia/Schizoaffective disorder:No   Duration of Psychotic Symptoms:NA Hallucinations:Hallucinations: None   Ideas of Reference:None   Suicidal Thoughts::Denies   Homicidal Thoughts:Homicidal Thoughts: No     Sensorium  Memory: Fair   Judgment: Limited   Insight: Shallow     Executive Functions  Concentration: Fair   Attention Span: Fair   Fund of Knowledge: Fair   Language: Fair     Psychomotor Activity  Psychomotor Activity: Psychomotor Activity: Normal     Assets  Assets: Desire for Improvement; Resilience; Social Support     Sleep  Sleep: Sleep:Fair       Physical Exam: Physical Exam Vitals reviewed.  Constitutional:      General: She is not in acute distress.    Appearance: She is not toxic-appearing.  Pulmonary:     Effort: Pulmonary effort is normal.  Neurological:     Mental Status: She is alert.  Psychiatric:        Behavior: Behavior normal.      Review of Systems  Constitutional:  Negative for chills and fever.  Cardiovascular:  Negative for chest pain and palpitations.  Neurological:  Negative for dizziness, tingling, tremors and headaches.    Blood pressure (!) 140/68, pulse 66, temperature (!) 97.5 F (36.4 C), resp. rate 15, height 5\' 7"  (1.702 m), weight 64.6 kg, SpO2 97%. Body mass index is 22.32 kg/m.   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 Psychiatric Diagnoses and Treatment:    -Increased Zoloft from 75 mg once daily to 100 mg once daily for depression. 08/11/23    -- Encouraged patient to participate in unit milieu and in scheduled group therapies     3. Medical Issues Being Addressed:    On Brilinta for MI   4. Discharge Planning:   -- Social work and case management to assist with discharge planning and identification of hospital follow-up needs prior to discharge  -- Estimated LOS: 4-5 days  -- Discharge Goals: Return home with outpatient referrals for mental health follow-up including medication management/psychotherapy   Lewanda Rife, MD

## 2023-08-11 NOTE — Group Note (Signed)
Date:  08/11/2023 Time:  11:18 AM  Group Topic/Focus:  Music Therapy/Coloring Sheet Activity The purpose of this group is to allow patients to listen to soothing music while coloring a positive affirmations coloring sheet    Participation Level:  Did Not Attend  Participation Quality:    Affect:    Cognitive:    Insight:   Engagement in Group:    Modes of Intervention:    Additional Comments:  Did not attend  Karina Freeman [name] 08/11/2023, 11:18 AM

## 2023-08-11 NOTE — Progress Notes (Signed)
Karina Freeman is a 73 y.o. female patient. No diagnosis found. Past Medical History:  Diagnosis Date   Abnormal Pap smear of cervix 11/11/2015   Acute HFrEF (heart failure with reduced ejection fraction) (HCC) 02/22/2023   Acute ST elevation myocardial infarction (STEMI) of anterior wall (HCC) 02/15/2023   Anxiety    CVA (cerebral infarction)    Depressive disorder    GERD (gastroesophageal reflux disease)    Hyperlipidemia    Hypertension    IFG (impaired fasting glucose)    Osteoporosis    ST elevation myocardial infarction (STEMI) (HCC) 02/17/2023   Current Facility-Administered Medications  Medication Dose Route Frequency Provider Last Rate Last Admin   acetaminophen (TYLENOL) tablet 650 mg  650 mg Oral Q6H PRN Jearld Lesch, NP   650 mg at 08/04/23 0148   alum & mag hydroxide-simeth (MAALOX/MYLANTA) 200-200-20 MG/5ML suspension 30 mL  30 mL Oral Q4H PRN Jearld Lesch, NP       aspirin chewable tablet 81 mg  81 mg Oral Daily Dixon, Rashaun M, NP   81 mg at 08/10/23 1055   atorvastatin (LIPITOR) tablet 80 mg  80 mg Oral Daily Dixon, Rashaun M, NP   80 mg at 08/10/23 1055   carvedilol (COREG) tablet 12.5 mg  12.5 mg Oral BID WC Dixon, Rashaun M, NP   12.5 mg at 08/10/23 1710   clonazePAM (KLONOPIN) tablet 0.5 mg  0.5 mg Oral Daily Lewanda Rife, MD   0.5 mg at 08/10/23 1056   haloperidol (HALDOL) tablet 5 mg  5 mg Oral TID PRN Jearld Lesch, NP       And   diphenhydrAMINE (BENADRYL) capsule 50 mg  50 mg Oral TID PRN Jearld Lesch, NP       haloperidol lactate (HALDOL) injection 5 mg  5 mg Intramuscular TID PRN Jearld Lesch, NP       And   diphenhydrAMINE (BENADRYL) injection 50 mg  50 mg Intramuscular TID PRN Jearld Lesch, NP       And   LORazepam (ATIVAN) injection 2 mg  2 mg Intramuscular TID PRN Jearld Lesch, NP       haloperidol lactate (HALDOL) injection 10 mg  10 mg Intramuscular TID PRN Jearld Lesch, NP       And   diphenhydrAMINE  (BENADRYL) injection 50 mg  50 mg Intramuscular TID PRN Jearld Lesch, NP       And   LORazepam (ATIVAN) injection 2 mg  2 mg Intramuscular TID PRN Durwin Nora, Rashaun M, NP       feeding supplement (ENSURE ENLIVE / ENSURE PLUS) liquid 237 mL  237 mL Oral BID BM Massengill, Harrold Donath, MD   237 mL at 08/09/23 1029   hydrOXYzine (ATARAX) tablet 25 mg  25 mg Oral TID PRN Jearld Lesch, NP       losartan (COZAAR) tablet 50 mg  50 mg Oral Daily Dixon, Rashaun M, NP   50 mg at 08/10/23 1055   magnesium hydroxide (MILK OF MAGNESIA) suspension 30 mL  30 mL Oral Daily PRN Jearld Lesch, NP       multivitamin with minerals tablet 1 tablet  1 tablet Oral Daily Massengill, Nathan, MD   1 tablet at 08/10/23 1054   sertraline (ZOLOFT) tablet 100 mg  100 mg Oral Daily Lewanda Rife, MD       ticagrelor (BRILINTA) tablet 90 mg  90 mg Oral BID Jearld Lesch, NP  90 mg at 08/10/23 2141   traZODone (DESYREL) tablet 50 mg  50 mg Oral QHS PRN Jearld Lesch, NP   50 mg at 08/06/23 2109   Allergies  Allergen Reactions   Citalopram Hydrobromide Nausea And Vomiting   Codeine Diarrhea and Nausea And Vomiting   Penicillin G Benzathine    Principal Problem:   MDD (major depressive disorder)  Blood pressure 131/71, pulse 64, temperature (!) 97.4 F (36.3 C), resp. rate 15, height 5\' 7"  (1.702 m), weight 64.6 kg, SpO2 98%.  Subjective Objective: Vital signs: (most recent): Blood pressure 131/71, pulse 64, temperature (!) 97.4 F (36.3 C), resp. rate 15, height 5\' 7"  (1.702 m), weight 64.6 kg, SpO2 98%.    Assessment & Plan Pt. Took meds. No issues reported. Pt. Denies SI&HI  Gwenetta Devos B Anatalia Kronk 08/11/2023

## 2023-08-11 NOTE — Progress Notes (Signed)
   08/11/23 1300  Psych Admission Type (Psych Patients Only)  Admission Status Involuntary  Psychosocial Assessment  Patient Complaints Anxiety  Eye Contact Brief  Facial Expression Flat  Affect Flat  Speech Soft  Interaction Minimal  Motor Activity Slow  Appearance/Hygiene Unremarkable  Behavior Characteristics Appropriate to situation  Mood Depressed  Thought Process  Coherency WDL  Content WDL  Delusions None reported or observed  Perception WDL  Hallucination None reported or observed  Judgment Impaired  Confusion None  Danger to Self  Current suicidal ideation? Denies  Danger to Others  Danger to Others None reported or observed

## 2023-08-12 ENCOUNTER — Ambulatory Visit: Payer: Medicare Other | Admitting: Psychiatry

## 2023-08-12 DIAGNOSIS — F332 Major depressive disorder, recurrent severe without psychotic features: Secondary | ICD-10-CM | POA: Diagnosis not present

## 2023-08-12 NOTE — Progress Notes (Signed)
   08/12/23 2200  Psych Admission Type (Psych Patients Only)  Admission Status Voluntary  Psychosocial Assessment  Patient Complaints Depression  Eye Contact Fair  Facial Expression Flat  Affect Flat  Speech Soft  Interaction Assertive  Motor Activity Slow  Appearance/Hygiene Unremarkable  Behavior Characteristics Cooperative  Mood Depressed  Thought Process  Coherency WDL  Content WDL  Delusions None reported or observed  Perception WDL  Hallucination None reported or observed  Judgment Impaired  Confusion None  Danger to Self  Current suicidal ideation? Denies  Danger to Others  Danger to Others None reported or observed

## 2023-08-12 NOTE — Progress Notes (Signed)
   08/11/23 2000  Psych Admission Type (Psych Patients Only)  Admission Status Involuntary  Psychosocial Assessment  Patient Complaints Anxiety  Eye Contact Brief  Facial Expression Flat  Affect Flat  Speech Soft  Interaction Minimal  Motor Activity Slow  Appearance/Hygiene Unremarkable  Behavior Characteristics Appropriate to situation  Mood Depressed  Thought Process  Coherency WDL  Content WDL  Delusions None reported or observed  Perception WDL  Hallucination None reported or observed  Judgment Impaired  Confusion None  Danger to Self  Current suicidal ideation? Denies (Denies)  Danger to Others  Danger to Others None reported or observed

## 2023-08-12 NOTE — Progress Notes (Signed)
Recreation Therapy Notes  LRT provided fingernail care to patient.     Shaneka Efaw E Torey Regan 08/12/2023 5:50 PM

## 2023-08-12 NOTE — Group Note (Signed)
Recreation Therapy Group Note   Group Topic:Communication  Group Date: 08/12/2023 Start Time: 1400 End Time: 1440 Facilitators: Rosina Lowenstein, LRT, CTRS Location:  Dayroom  Group Description: Name That Food. Pt will randomly select 1 cut paper from laminated stack of popular food images from the 1970's-1990's from LRT. Without showing the group the image they selected, pt will use descriptive words to explain what food they selected. Once the image is correctly guessed, LRT and pts will reminisce and discuss past fond memories associated with the food as well as the importance of communication.  Goal Area(s) Addressed: Patient will increase communication skills.  Patient will increase frustration tolerance skills. Patient will reminisce a fond memory in their life.     Affect/Mood: Appropriate   Participation Level: Active and Engaged   Participation Quality: Independent   Behavior: Calm and Cooperative   Speech/Thought Process: Coherent   Insight: Fair   Judgement: Fair    Modes of Intervention: Exploration, Guided Discussion, Socialization, and Support   Patient Response to Interventions:  Attentive, Engaged, Interested , and Receptive   Education Outcome:  Acknowledges education   Clinical Observations/Individualized Feedback: Aviella was active in their participation of session activities and group discussion. Pt spontaneously contributed to group discussion while interacting well with LRT and peers duration of session.    Plan: Continue to engage patient in RT group sessions 2-3x/week.   Rosina Lowenstein, LRT, CTRS 08/12/2023 4:34 PM

## 2023-08-12 NOTE — Progress Notes (Signed)
Patient admitted IVC on 08/03/23 to [facility] for what appeared to be an attempted suicide via a wire clothes hanger.   Patient was more visible in the milieu than previous days. She was seen interacting and watching tv with her peers appropriately. She attended rec therapy group and was actively engaged. She continues to take medications 1 at a time. This is completed whole without difficulty. Evening Carvedilol held due to low BP.  Q15 minute unit checks in place.

## 2023-08-12 NOTE — Group Note (Unsigned)
Date:  08/13/2023 Time:  12:04 AM  Group Topic/Focus:  Wrap-Up Group:   The focus of this group is to help patients review their daily goal of treatment and discuss progress on daily workbooks.    Participation Level:  Active  Participation Quality:  Appropriate  Affect:  Appropriate  Cognitive:  Appropriate  Insight: Appropriate  Engagement in Group:  Engaged  Modes of Intervention:  Discussion  Additional Comments:    Karina Freeman 08/13/2023, 12:04 AM

## 2023-08-12 NOTE — Progress Notes (Signed)
[facility] MD Progress Note  08/12/2023 2:37 PM Karina Freeman  MRN:  [id number]  Patient is a 73 year old female presenting to [facility] ED under IVC. Per triage note Pt husband and daughter left pt at home to go run errands and found that pt right ear was bleeding when they returned home and found a wire clothes hanger on the bathroom floor.  Patient was evaluated in the emergency room and admitted to [facility] due to psych unit.  Patient was admitted to the unit in October 2020 for for depression and suicide attempt by overdose.  Subjective:  Chart reviewed, case discussed in multidisciplinary meeting, patient seen during rounds.  Today on interview patient is noted to be sitting in the dayroom.  She was calm and cooperative.  She reports she is doing much better.  She did acknowledge that she tied a rope around her neck as a suicide attempt at home.  She denies having any SI/HI/plan.  She denies auditory/visual hallucinations.  She has fair appetite and sleep.  She is looking forward to go home and follow-up with her outpatient doctors.  Sleep: Fair  Appetite:  Fair  Past Psychiatric History: see h&P Family History:  Family History  Problem Relation Age of Onset   Heart disease Mother    Cancer Mother        breast   Heart disease Father    Heart disease Brother    Bipolar disorder Brother    Heart disease Sister    Breast cancer Neg Hx    Social History:  Social History   Substance and Sexual Activity  Alcohol Use No   Alcohol/week: 0.0 standard drinks of alcohol     Social History   Substance and Sexual Activity  Drug Use No    Social History   Socioeconomic History   Marital status: Married    Spouse name: [name]   Number of children: 3   Years of education: Not on file   Highest education level: Not on file  Occupational History   Not on file  Tobacco Use   Smoking status: Former    Current packs/day: 0.00    Types: Cigarettes    Quit date: 07/16/1973    Years since quitting:  50.1   Smokeless tobacco: Never  Vaping Use   Vaping status: Never Used  Substance and Sexual Activity   Alcohol use: No    Alcohol/week: 0.0 standard drinks of alcohol   Drug use: No   Sexual activity: Yes  Other Topics Concern   Not on file  Social History Narrative   Not on file   Social Drivers of Health   Financial Resource Strain: Not on file  Food Insecurity: No Food Insecurity (08/03/2023)   Hunger Vital Sign    Worried About Running Out of Food in the Last Year: Never true    Ran Out of Food in the Last Year: Never true  Transportation Needs: No Transportation Needs (08/03/2023)   PRAPARE - Administrator, Civil Service (Medical): No    Lack of Transportation (Non-Medical): No  Physical Activity: Not on file  Stress: Not on file  Social Connections: Moderately Integrated (08/03/2023)   Social Connection and Isolation Panel [NHANES]    Frequency of Communication with Friends and Family: Once a week    Frequency of Social Gatherings with Friends and Family: Once a week    Attends Religious Services: 1 to 4 times per year    Active Member  of Clubs or Organizations: Yes    Attends Banker Meetings: 1 to 4 times per year    Marital Status: Married   Past Medical History:  Past Medical History:  Diagnosis Date   Abnormal Pap smear of cervix 11/11/2015   Acute HFrEF (heart failure with reduced ejection fraction) (HCC) 02/22/2023   Acute ST elevation myocardial infarction (STEMI) of anterior wall (HCC) 02/15/2023   Anxiety    CVA (cerebral infarction)    Depressive disorder    GERD (gastroesophageal reflux disease)    Hyperlipidemia    Hypertension    IFG (impaired fasting glucose)    Osteoporosis    ST elevation myocardial infarction (STEMI) (HCC) 02/17/2023    Past Surgical History:  Procedure Laterality Date   APPENDECTOMY     CORONARY IMAGING/OCT N/A 02/21/2023   Procedure: CORONARY IMAGING/OCT;  Surgeon: Yvonne Kendall, MD;  Location:  [facility] INVASIVE CV LAB;  Service: Cardiovascular;  Laterality: N/A;   CORONARY PRESSURE/FFR STUDY N/A 02/21/2023   Procedure: CORONARY PRESSURE/FFR STUDY;  Surgeon: Yvonne Kendall, MD;  Location: [facility] INVASIVE CV LAB;  Service: Cardiovascular;  Laterality: N/A;   CORONARY STENT INTERVENTION N/A 02/21/2023   Procedure: CORONARY STENT INTERVENTION;  Surgeon: Yvonne Kendall, MD;  Location: [facility] INVASIVE CV LAB;  Service: Cardiovascular;  Laterality: N/A;   CORONARY/GRAFT ACUTE MI REVASCULARIZATION N/A 02/15/2023   Procedure: Coronary/Graft Acute MI Revascularization;  Surgeon: Iran Ouch, MD;  Location: [facility] INVASIVE CV LAB;  Service: Cardiovascular;  Laterality: N/A;   LEFT HEART CATH AND CORONARY ANGIOGRAPHY N/A 02/15/2023   Procedure: LEFT HEART CATH AND CORONARY ANGIOGRAPHY;  Surgeon: Iran Ouch, MD;  Location: [facility] INVASIVE CV LAB;  Service: Cardiovascular;  Laterality: N/A;   LEFT HEART CATH AND CORONARY ANGIOGRAPHY N/A 02/21/2023   Procedure: LEFT HEART CATH AND CORONARY ANGIOGRAPHY;  Surgeon: Yvonne Kendall, MD;  Location: [facility] INVASIVE CV LAB;  Service: Cardiovascular;  Laterality: N/A;   TONSILLECTOMY     TUBAL LIGATION      Current Medications: Current Facility-Administered Medications  Medication Dose Route Frequency Provider Last Rate Last Admin   acetaminophen (TYLENOL) tablet 650 mg  650 mg Oral Q6H PRN Jearld Lesch, NP   650 mg at 08/04/23 0148   alum & mag hydroxide-simeth (MAALOX/MYLANTA) 200-200-20 MG/5ML suspension 30 mL  30 mL Oral Q4H PRN Jearld Lesch, NP       aspirin chewable tablet 81 mg  81 mg Oral Daily Dixon, Rashaun M, NP   81 mg at 08/12/23 0914   atorvastatin (LIPITOR) tablet 80 mg  80 mg Oral Daily Lerry Liner M, NP   80 mg at 08/12/23 0914   carvedilol (COREG) tablet 12.5 mg  12.5 mg Oral BID WC Dixon, Rashaun M, NP   12.5 mg at 08/12/23 5621   clonazePAM (KLONOPIN) tablet 0.5 mg  0.5 mg Oral Daily Lewanda Rife, MD   0.5 mg at 08/12/23 3086    haloperidol (HALDOL) tablet 5 mg  5 mg Oral TID PRN Jearld Lesch, NP       And   diphenhydrAMINE (BENADRYL) capsule 50 mg  50 mg Oral TID PRN Jearld Lesch, NP       haloperidol lactate (HALDOL) injection 5 mg  5 mg Intramuscular TID PRN Jearld Lesch, NP       And   diphenhydrAMINE (BENADRYL) injection 50 mg  50 mg Intramuscular TID PRN Jearld Lesch, NP       And   LORazepam (ATIVAN)  injection 2 mg  2 mg Intramuscular TID PRN Jearld Lesch, NP       haloperidol lactate (HALDOL) injection 10 mg  10 mg Intramuscular TID PRN Jearld Lesch, NP       And   diphenhydrAMINE (BENADRYL) injection 50 mg  50 mg Intramuscular TID PRN Jearld Lesch, NP       And   LORazepam (ATIVAN) injection 2 mg  2 mg Intramuscular TID PRN Durwin Nora, Rashaun M, NP       feeding supplement (ENSURE ENLIVE / ENSURE PLUS) liquid 237 mL  237 mL Oral BID BM Massengill, Harrold Donath, MD   237 mL at 08/12/23 1022   hydrOXYzine (ATARAX) tablet 25 mg  25 mg Oral TID PRN Jearld Lesch, NP       losartan (COZAAR) tablet 50 mg  50 mg Oral Daily Dixon, Rashaun M, NP   50 mg at 08/12/23 1610   magnesium hydroxide (MILK OF MAGNESIA) suspension 30 mL  30 mL Oral Daily PRN Jearld Lesch, NP       multivitamin with minerals tablet 1 tablet  1 tablet Oral Daily Massengill, Nathan, MD   1 tablet at 08/12/23 0914   sertraline (ZOLOFT) tablet 100 mg  100 mg Oral Daily Lewanda Rife, MD   100 mg at 08/12/23 0914   ticagrelor (BRILINTA) tablet 90 mg  90 mg Oral BID Lerry Liner M, NP   90 mg at 08/12/23 0914   traZODone (DESYREL) tablet 50 mg  50 mg Oral QHS PRN Jearld Lesch, NP   50 mg at 08/11/23 2125    Lab Results: No results found for this or any previous visit (from the past 48 hours).  Blood Alcohol level:  Lab Results  Component Value Date   ETH <10 08/02/2023   ETH <10 05/03/2023    Metabolic Disorder Labs: Lab Results  Component Value Date   HGBA1C 5.4 02/15/2023   MPG 108.28 02/15/2023    No results found for: "PROLACTIN" Lab Results  Component Value Date   CHOL 125 03/14/2023   TRIG 86 03/14/2023   HDL 52 03/14/2023   CHOLHDL 2.4 03/14/2023   VLDL 17 02/15/2023   LDLCALC 56 03/14/2023   LDLCALC 164 (H) 02/15/2023    Physical Findings: AIMS:  , ,  ,  ,    CIWA:    COWS:      Psychiatric Specialty Exam:  Presentation  General Appearance:  Appropriate for Environment  Eye Contact: Fair  Speech: Clear and Coherent  Speech Volume: Normal    Mood and Affect  Mood: Depressed  Affect: Appropriate   Thought Process  Thought Processes: Coherent  Descriptions of Associations:Intact  Orientation:Full (Time, Place and Person)  Thought Content:Logical  Hallucinations:Hallucinations: None  Ideas of Reference:None  Suicidal Thoughts:Suicidal Thoughts: No  Homicidal Thoughts:Homicidal Thoughts: No   Sensorium  Memory: Immediate Fair; Recent Poor; Remote Fair  Judgment: Impaired  Insight: Shallow   Executive Functions  Concentration: Poor  Attention Span: Fair  Recall: [employer] of Knowledge: Fair  Language: Fair   Psychomotor Activity  Psychomotor Activity: Psychomotor Activity: Normal  Musculoskeletal: Strength & Muscle Tone: within normal limits Gait & Station: normal Assets  Assets: Manufacturing systems engineer; Desire for Improvement; Physical Health    Physical Exam: Physical Exam Vitals and nursing note reviewed.  HENT:     Head: Normocephalic.     Nose: Nose normal.     Mouth/Throat:     Mouth: Mucous membranes are  moist.  Eyes:     Pupils: Pupils are equal, round, and reactive to light.  Cardiovascular:     Rate and Rhythm: Normal rate.  Pulmonary:     Breath sounds: Normal breath sounds.  Abdominal:     General: Bowel sounds are normal.  Skin:    General: Skin is warm.  Neurological:     General: No focal deficit present.     Mental Status: She is alert.    Review of Systems   Constitutional: Negative.   HENT: Negative.    Eyes: Negative.   Respiratory: Negative.    Cardiovascular: Negative.   Gastrointestinal: Negative.   Skin: Negative.   Neurological: Negative.    Blood pressure 126/72, pulse 67, temperature 98.1 F (36.7 C), resp. rate 18, height 5\' 7"  (1.702 m), weight 64.6 kg, SpO2 97%. Body mass index is 22.32 kg/m.  Diagnosis: Principal Problem:   MDD (major depressive disorder)   PLAN: Safety and Monitoring:  -- Involuntary admission to inpatient psychiatric unit for safety, stabilization and treatment  -- Daily contact with patient to assess and evaluate symptoms and progress in treatment  -- Patient's case to be discussed in multi-disciplinary team meeting  -- Observation Level : q15 minute checks  -- Vital signs:  q12 hours  -- Precautions: suicide, elopement, and assault -- Encouraged patient to participate in unit milieu and in scheduled group therapies  2. Psychiatric Diagnoses and Treatment:    Zoloft 100 mg once daily for depression     3. Medical Issues Being Addressed:     4. Discharge Planning:   -- Social work and case management to assist with discharge planning and identification of hospital follow-up needs prior to discharge  -- Estimated LOS: 3-4 days  Verner Chol, MD 08/12/2023, 2:37 PM

## 2023-08-12 NOTE — Plan of Care (Signed)
  Problem: Health Behavior/Discharge Planning: Goal: Identification of resources available to assist in meeting health care needs will improve Outcome: Not Progressing   Problem: Medication: Goal: Compliance with prescribed medication regimen will improve Outcome: Progressing

## 2023-08-12 NOTE — Plan of Care (Signed)
Problem: Education: Goal: Ability to make informed decisions regarding treatment will improve Outcome: Progressing   Problem: Coping: Goal: Coping ability will improve Outcome: Progressing   Problem: Health Behavior/Discharge Planning: Goal: Identification of resources available to assist in meeting health care needs will improve Outcome: Progressing

## 2023-08-13 ENCOUNTER — Ambulatory Visit: Payer: Medicare Other | Admitting: Psychiatry

## 2023-08-13 DIAGNOSIS — F332 Major depressive disorder, recurrent severe without psychotic features: Secondary | ICD-10-CM | POA: Diagnosis not present

## 2023-08-13 NOTE — Group Note (Signed)
Date:  08/13/2023 Time:  9:18 PM  Group Topic/Focus:  Wrap-Up Group:   The focus of this group is to help patients review their daily goal of treatment and discuss progress on daily workbooks.    Participation Level:  Active  Participation Quality:  Appropriate  Affect:  Appropriate  Cognitive:  Appropriate  Insight: Appropriate  Engagement in Group:  Engaged  Modes of Intervention:  Discussion  Additional Comments:    Karina Freeman 08/13/2023, 9:18 PM

## 2023-08-13 NOTE — Plan of Care (Signed)
z

## 2023-08-13 NOTE — Progress Notes (Signed)
   08/13/23 1300  Psych Admission Type (Psych Patients Only)  Admission Status Voluntary  Psychosocial Assessment  Patient Complaints Anxiety;Depression  Eye Contact Fair  Facial Expression Flat  Affect Flat  Speech Soft  Interaction Assertive  Motor Activity Slow  Appearance/Hygiene Unremarkable  Behavior Characteristics Cooperative  Mood Depressed  Thought Process  Coherency WDL  Content WDL  Delusions None reported or observed  Perception WDL  Hallucination None reported or observed  Judgment Impaired  Confusion None  Danger to Self  Current suicidal ideation? Denies  Danger to Others  Danger to Others None reported or observed

## 2023-08-13 NOTE — Group Note (Signed)
Recreation Therapy Group Note   Group Topic:Leisure Education  Group Date: 08/13/2023 Start Time: 1400 End Time: 1445 Facilitators: Rosina Lowenstein, LRT, CTRS Location:  [place]  Group Description: Music. Patients encouraged to name their favorite song(s) for LRT to play song through speaker for group to hear. Patient educated on the definition of leisure and the importance of having different leisure interests outside of the hospital. Group discussed how leisure activities can often be used as Pharmacologist and that listening to music is one example.   Goal Area(s) Addressed:  Patient will identify a current leisure interest.  Patient will practice making a positive decision. Patient will have the opportunity to try a new leisure activity.   Affect/Mood: Appropriate   Participation Level: Moderate   Participation Quality: Independent   Behavior: Calm and Cooperative   Speech/Thought Process: Coherent   Insight: Good   Judgement: Good   Modes of Intervention: Exploration, Guided Discussion, Music, and Rapport Building   Patient Response to Interventions:  Attentive, Engaged, and Receptive   Education Outcome:  Acknowledges education   Clinical Observations/Individualized Feedback: Karina Freeman was active in their participation of session activities and group discussion. Pt identified "Karina Freeman" is her favorite Tree surgeon and she likes all songs by her. Pt was observed swaying in her seat along to the [employer]. Pt minimally interacted with LRT and peers while present in group.    Plan: Continue to engage patient in RT group sessions 2-3x/week.   Rosina Lowenstein, LRT, CTRS 08/13/2023 4:53 PM

## 2023-08-13 NOTE — Progress Notes (Signed)
   08/13/23 2000  Psych Admission Type (Psych Patients Only)  Admission Status Voluntary  Psychosocial Assessment  Patient Complaints Other (Comment) (restlessness in her right leg at night)  Eye Contact Fair  Facial Expression Flat  Affect Flat  Speech Soft  Interaction Assertive  Motor Activity Slow  Appearance/Hygiene Unremarkable  Behavior Characteristics Cooperative  Mood Depressed;Pleasant  Thought Process  Coherency WDL  Content WDL  Delusions None reported or observed  Perception WDL  Hallucination None reported or observed  Judgment Limited  Confusion None  Danger to Self  Current suicidal ideation? Denies  Danger to Others  Danger to Others None reported or observed   Progress note   D: Pt seen in hallway.Husband visiting her tonight. Pt denies SI, HI, AVH. Pt rates pain  0/10. Does endorse muscle twitching in her right leg that wakes her up at night. Says it has been going on since she has been in the hospital. Has not communicated this to the provider. Pt denies anxiety and depression. Says she had a good visit with her husband. Said the best thing about her day was the groups. "I am learning so much." Pt seen and active in the milieu. No other concerns noted at this time.  A: Pt provided support and encouragement. Pt given scheduled medication as prescribed. PRNs as appropriate. Q15 min checks for safety.   R: Pt safe on the unit. Will continue to monitor.

## 2023-08-13 NOTE — Plan of Care (Signed)
Marland Kitchen

## 2023-08-13 NOTE — Plan of Care (Signed)
Problem: Education: Goal: Ability to make informed decisions regarding treatment will improve Outcome: Progressing   Problem: Coping: Goal: Coping ability will improve Outcome: Progressing   Problem: Health Behavior/Discharge Planning: Goal: Identification of resources available to assist in meeting health care needs will improve Outcome: Progressing   Problem: Medication: Goal: Compliance with prescribed medication regimen will improve Outcome: Progressing   Problem: Self-Concept: Goal: Ability to disclose and discuss suicidal ideas will improve Outcome: Progressing Goal: Will verbalize positive feelings about self Outcome: Progressing

## 2023-08-13 NOTE — Progress Notes (Signed)
[facility] MD Progress Note  08/13/2023 12:05 PM Karina Freeman  MRN:  [id number]  Patient is a 73 year old female presenting to [facility] ED under IVC. Per triage note Pt husband and daughter left pt at home to go run errands and found that pt right ear was bleeding when they returned home and found a wire clothes hanger on the bathroom floor.  Patient was evaluated in the emergency room and admitted to [facility] due to psych unit.  Patient was admitted to the unit in October 2020 for for depression and suicide attempt by overdose.  Subjective:  Chart reviewed, case discussed in multidisciplinary meeting, patient seen during rounds.  Patient reports that she is doing better and remains focused on going home.  She did acknowledge that the groups here and being in the inpatient unit is helping her.  She reports that at home her husband does everything for her and sometimes she feels bad for overwhelming him.  She denies SI/HI/plan.  She denies auditory/visual hallucinations.  She is able to acknowledge that she had a heart attack sometime ago when she has been depressed recently.   Sleep: Fair  Appetite:  Fair  Past Psychiatric History: see h&P Family History:  Family History  Problem Relation Age of Onset   Heart disease Mother    Cancer Mother        breast   Heart disease Father    Heart disease Brother    Bipolar disorder Brother    Heart disease Sister    Breast cancer Neg Hx    Social History:  Social History   Substance and Sexual Activity  Alcohol Use No   Alcohol/week: 0.0 standard drinks of alcohol     Social History   Substance and Sexual Activity  Drug Use No    Social History   Socioeconomic History   Marital status: Married    Spouse name: Karina Freeman   Number of children: 3   Years of education: Not on file   Highest education level: Not on file  Occupational History   Not on file  Tobacco Use   Smoking status: Former    Current packs/day: 0.00    Types: Cigarettes    Quit  date: 07/16/1973    Years since quitting: 50.1   Smokeless tobacco: Never  Vaping Use   Vaping status: Never Used  Substance and Sexual Activity   Alcohol use: No    Alcohol/week: 0.0 standard drinks of alcohol   Drug use: No   Sexual activity: Yes  Other Topics Concern   Not on file  Social History Narrative   Not on file   Social Drivers of Health   Financial Resource Strain: Not on file  Food Insecurity: No Food Insecurity (08/03/2023)   Hunger Vital Sign    Worried About Running Out of Food in the Last Year: Never true    Ran Out of Food in the Last Year: Never true  Transportation Needs: No Transportation Needs (08/03/2023)   PRAPARE - Administrator, Civil Service (Medical): No    Lack of Transportation (Non-Medical): No  Physical Activity: Not on file  Stress: Not on file  Social Connections: Moderately Integrated (08/03/2023)   Social Connection and Isolation Panel [NHANES]    Frequency of Communication with Friends and Family: Once a week    Frequency of Social Gatherings with Friends and Family: Once a week    Attends Religious Services: 1 to 4 times per year  Active Member of Clubs or Organizations: Yes    Attends Banker Meetings: 1 to 4 times per year    Marital Status: Married   Past Medical History:  Past Medical History:  Diagnosis Date   Abnormal Pap smear of cervix 11/11/2015   Acute HFrEF (heart failure with reduced ejection fraction) (HCC) 02/22/2023   Acute ST elevation myocardial infarction (STEMI) of anterior wall (HCC) 02/15/2023   Anxiety    CVA (cerebral infarction)    Depressive disorder    GERD (gastroesophageal reflux disease)    Hyperlipidemia    Hypertension    IFG (impaired fasting glucose)    Osteoporosis    ST elevation myocardial infarction (STEMI) (HCC) 02/17/2023    Past Surgical History:  Procedure Laterality Date   APPENDECTOMY     CORONARY IMAGING/OCT N/A 02/21/2023   Procedure: CORONARY IMAGING/OCT;   Surgeon: Yvonne Kendall, MD;  Location: [facility] INVASIVE CV LAB;  Service: Cardiovascular;  Laterality: N/A;   CORONARY PRESSURE/FFR STUDY N/A 02/21/2023   Procedure: CORONARY PRESSURE/FFR STUDY;  Surgeon: Yvonne Kendall, MD;  Location: [facility] INVASIVE CV LAB;  Service: Cardiovascular;  Laterality: N/A;   CORONARY STENT INTERVENTION N/A 02/21/2023   Procedure: CORONARY STENT INTERVENTION;  Surgeon: Yvonne Kendall, MD;  Location: [facility] INVASIVE CV LAB;  Service: Cardiovascular;  Laterality: N/A;   CORONARY/GRAFT ACUTE MI REVASCULARIZATION N/A 02/15/2023   Procedure: Coronary/Graft Acute MI Revascularization;  Surgeon: Iran Ouch, MD;  Location: [facility] INVASIVE CV LAB;  Service: Cardiovascular;  Laterality: N/A;   LEFT HEART CATH AND CORONARY ANGIOGRAPHY N/A 02/15/2023   Procedure: LEFT HEART CATH AND CORONARY ANGIOGRAPHY;  Surgeon: Iran Ouch, MD;  Location: [facility] INVASIVE CV LAB;  Service: Cardiovascular;  Laterality: N/A;   LEFT HEART CATH AND CORONARY ANGIOGRAPHY N/A 02/21/2023   Procedure: LEFT HEART CATH AND CORONARY ANGIOGRAPHY;  Surgeon: Yvonne Kendall, MD;  Location: [facility] INVASIVE CV LAB;  Service: Cardiovascular;  Laterality: N/A;   TONSILLECTOMY     TUBAL LIGATION      Current Medications: Current Facility-Administered Medications  Medication Dose Route Frequency Provider Last Rate Last Admin   acetaminophen (TYLENOL) tablet 650 mg  650 mg Oral Q6H PRN Jearld Lesch, NP   650 mg at 08/04/23 0148   alum & mag hydroxide-simeth (MAALOX/MYLANTA) 200-200-20 MG/5ML suspension 30 mL  30 mL Oral Q4H PRN Jearld Lesch, NP       aspirin chewable tablet 81 mg  81 mg Oral Daily Dixon, Rashaun M, NP   81 mg at 08/13/23 1000   atorvastatin (LIPITOR) tablet 80 mg  80 mg Oral Daily Dixon, Rashaun M, NP   80 mg at 08/13/23 1000   carvedilol (COREG) tablet 12.5 mg  12.5 mg Oral BID WC Dixon, Rashaun M, NP   12.5 mg at 08/13/23 1000   clonazePAM (KLONOPIN) tablet 0.5 mg  0.5 mg Oral Daily  Lewanda Rife, MD   0.5 mg at 08/13/23 1000   haloperidol (HALDOL) tablet 5 mg  5 mg Oral TID PRN Jearld Lesch, NP       And   diphenhydrAMINE (BENADRYL) capsule 50 mg  50 mg Oral TID PRN Jearld Lesch, NP       haloperidol lactate (HALDOL) injection 5 mg  5 mg Intramuscular TID PRN Jearld Lesch, NP       And   diphenhydrAMINE (BENADRYL) injection 50 mg  50 mg Intramuscular TID PRN Jearld Lesch, NP       And  LORazepam (ATIVAN) injection 2 mg  2 mg Intramuscular TID PRN Durwin Nora, Rashaun M, NP       haloperidol lactate (HALDOL) injection 10 mg  10 mg Intramuscular TID PRN Jearld Lesch, NP       And   diphenhydrAMINE (BENADRYL) injection 50 mg  50 mg Intramuscular TID PRN Jearld Lesch, NP       And   LORazepam (ATIVAN) injection 2 mg  2 mg Intramuscular TID PRN Durwin Nora, Rashaun M, NP       feeding supplement (ENSURE ENLIVE / ENSURE PLUS) liquid 237 mL  237 mL Oral BID BM Massengill, Harrold Donath, MD   237 mL at 08/13/23 1003   hydrOXYzine (ATARAX) tablet 25 mg  25 mg Oral TID PRN Jearld Lesch, NP       losartan (COZAAR) tablet 50 mg  50 mg Oral Daily Dixon, Rashaun M, NP   50 mg at 08/13/23 1000   magnesium hydroxide (MILK OF MAGNESIA) suspension 30 mL  30 mL Oral Daily PRN Jearld Lesch, NP       multivitamin with minerals tablet 1 tablet  1 tablet Oral Daily Massengill, Nathan, MD   1 tablet at 08/13/23 1000   sertraline (ZOLOFT) tablet 100 mg  100 mg Oral Daily Lewanda Rife, MD   100 mg at 08/13/23 1000   ticagrelor (BRILINTA) tablet 90 mg  90 mg Oral BID Lerry Liner M, NP   90 mg at 08/13/23 1000   traZODone (DESYREL) tablet 50 mg  50 mg Oral QHS PRN Jearld Lesch, NP   50 mg at 08/12/23 2128    Lab Results: No results found for this or any previous visit (from the past 48 hours).  Blood Alcohol level:  Lab Results  Component Value Date   ETH <10 08/02/2023   ETH <10 05/03/2023    Metabolic Disorder Labs: Lab Results  Component Value Date    HGBA1C 5.4 02/15/2023   MPG 108.28 02/15/2023   No results found for: "PROLACTIN" Lab Results  Component Value Date   CHOL 125 03/14/2023   TRIG 86 03/14/2023   HDL 52 03/14/2023   CHOLHDL 2.4 03/14/2023   VLDL 17 02/15/2023   LDLCALC 56 03/14/2023   LDLCALC 164 (H) 02/15/2023    Physical Findings: AIMS:  , ,  ,  ,    CIWA:    COWS:      Psychiatric Specialty Exam:  Presentation  General Appearance:  Appropriate for Environment; Casual  Eye Contact: Fair  Speech: Normal Rate  Speech Volume: Normal    Mood and Affect  Mood: Euthymic  Affect: Flat   Thought Process  Thought Processes: Other (comment) (impoverished)  Descriptions of Associations:Intact  Orientation:Partial  Thought Content:Logical  Hallucinations:Hallucinations: None  Ideas of Reference:None  Suicidal Thoughts:Suicidal Thoughts: No  Homicidal Thoughts:Homicidal Thoughts: No   Sensorium  Memory: Immediate Fair; Recent Fair; Remote Poor  Judgment: Impaired  Insight: Shallow   Executive Functions  Concentration: Poor  Attention Span: Poor  Recall: Poor  Fund of Knowledge: Fair  Language: Fair   Psychomotor Activity  Psychomotor Activity: Psychomotor Activity: Normal  Musculoskeletal: Strength & Muscle Tone: within normal limits Gait & Station: normal Assets  Assets: Manufacturing systems engineer; Desire for Improvement; Physical Health    Physical Exam: Physical Exam Vitals and nursing note reviewed.  HENT:     Head: Normocephalic.     Nose: Nose normal.     Mouth/Throat:     Mouth: Mucous membranes are  moist.  Eyes:     Pupils: Pupils are equal, round, and reactive to light.  Cardiovascular:     Rate and Rhythm: Normal rate.  Pulmonary:     Breath sounds: Normal breath sounds.  Abdominal:     General: Bowel sounds are normal.  Skin:    General: Skin is warm.  Neurological:     General: No focal deficit present.     Mental Status: She is  alert.    Review of Systems  Constitutional: Negative.   HENT: Negative.    Eyes: Negative.   Respiratory: Negative.    Cardiovascular: Negative.   Gastrointestinal: Negative.   Skin: Negative.   Neurological: Negative.    Blood pressure 111/63, pulse 75, temperature 98.2 F (36.8 C), resp. rate 18, height 5\' 7"  (1.702 m), weight 64.6 kg, SpO2 97%. Body mass index is 22.32 kg/m.  Diagnosis: Principal Problem:   MDD (major depressive disorder)   PLAN: Safety and Monitoring:Patient signed voluntary 08/12/23  -- Voluntary admission to inpatient psychiatric unit for safety, stabilization and treatment  -- Daily contact with patient to assess and evaluate symptoms and progress in treatment  -- Patient's case to be discussed in multi-disciplinary team meeting  -- Observation Level : q15 minute checks  -- Vital signs:  q12 hours  -- Precautions: suicide, elopement, and assault -- Encouraged patient to participate in unit milieu and in scheduled group therapies  2. Psychiatric Diagnoses and Treatment:    Zoloft 100 mg once daily for depression  Klonopin 0.5mg  daily   3. Medical Issues Being Addressed:     4. Discharge Planning:   -- Social work and case management to assist with discharge planning and identification of hospital follow-up needs prior to discharge  -- Estimated LOS: 3-4 days  Verner Chol, MD 08/13/2023, 12:05 PM

## 2023-08-14 DIAGNOSIS — F332 Major depressive disorder, recurrent severe without psychotic features: Secondary | ICD-10-CM | POA: Diagnosis not present

## 2023-08-14 NOTE — Plan of Care (Signed)
Problem: Coping: Goal: Coping ability will improve Outcome: Progressing   Problem: Medication: Goal: Compliance with prescribed medication regimen will improve Outcome: Progressing

## 2023-08-14 NOTE — Progress Notes (Signed)
   08/14/23 1300  Psych Admission Type (Psych Patients Only)  Admission Status Voluntary  Psychosocial Assessment  Patient Complaints None  Eye Contact Fair  Facial Expression Flat  Affect Flat  Speech Soft  Interaction Assertive  Motor Activity Slow  Appearance/Hygiene Unremarkable  Behavior Characteristics Cooperative  Mood Depressed  Thought Process  Coherency WDL  Content WDL  Delusions None reported or observed  Perception WDL  Hallucination None reported or observed  Judgment Impaired  Confusion None  Danger to Self  Current suicidal ideation? Denies  Danger to Others  Danger to Others None reported or observed

## 2023-08-14 NOTE — Progress Notes (Signed)
   08/14/23 2045  Psych Admission Type (Psych Patients Only)  Admission Status Voluntary  Psychosocial Assessment  Patient Complaints None  Eye Contact Fair  Facial Expression Flat  Affect Flat  Speech Soft  Interaction Assertive  Motor Activity Slow (uses walker)  Appearance/Hygiene Unremarkable  Behavior Characteristics Cooperative  Mood Pleasant  Thought Process  Coherency WDL  Content WDL  Delusions None reported or observed  Perception WDL  Hallucination None reported or observed  Judgment Impaired  Confusion None  Danger to Self  Current suicidal ideation? Denies  Danger to Others  Danger to Others None reported or observed

## 2023-08-14 NOTE — Progress Notes (Signed)
   08/14/23 8657  15 Minute Checks  Location Bedroom  Visual Appearance Calm  Behavior Sleeping  Sleep (Behavioral Health Patients Only)  Calculate sleep? (Click Yes once per 24 hr at 0600 safety check) Yes  Documented sleep last 24 hours 7.25

## 2023-08-14 NOTE — Progress Notes (Signed)
[facility] MD Progress Note  08/14/2023 11:55 AM Karina Freeman  MRN:  [id number]  Patient is a 73 year old female presenting to [facility] ED under IVC. Per triage note Pt husband and daughter left pt at home to go run errands and found that pt right ear was bleeding when they returned home and found a wire clothes hanger on the bathroom floor.  Patient was evaluated in the emergency room and admitted to [facility] due to psych unit.  Patient was admitted to the unit in October 2020 for for depression and suicide attempt by overdose.  Subjective:  Chart reviewed, case discussed in multidisciplinary meeting, patient seen during rounds.   Today on interview patient is noted to be sitting in the groups.  She has flat affect and minimal interaction.  She continues to minimize her symptoms and most of the time she is unable to recognize her triggers.  She did not endorse any active suicidal thoughts but is unable to identify her coping skills and any strategies to prevent recurrent suicide attempts.  She gives "I do not know "as an answer to most of the questions.  She is not endorsing auditory/visual hallucinations.  She is taking her medications with no reported side effects. Sleep: Fair  Appetite:  Fair  Past Psychiatric History: see h&P Family History:  Family History  Problem Relation Age of Onset   Heart disease Mother    Cancer Mother        breast   Heart disease Father    Heart disease Brother    Bipolar disorder Brother    Heart disease Sister    Breast cancer Neg Hx    Social History:  Social History   Substance and Sexual Activity  Alcohol Use No   Alcohol/week: 0.0 standard drinks of alcohol     Social History   Substance and Sexual Activity  Drug Use No    Social History   Socioeconomic History   Marital status: Married    Spouse name: [name]   Number of children: 3   Years of education: Not on file   Highest education level: Not on file  Occupational History   Not on file   Tobacco Use   Smoking status: Former    Current packs/day: 0.00    Types: Cigarettes    Quit date: 07/16/1973    Years since quitting: 50.1   Smokeless tobacco: Never  Vaping Use   Vaping status: Never Used  Substance and Sexual Activity   Alcohol use: No    Alcohol/week: 0.0 standard drinks of alcohol   Drug use: No   Sexual activity: Yes  Other Topics Concern   Not on file  Social History Narrative   Not on file   Social Drivers of Health   Financial Resource Strain: Not on file  Food Insecurity: No Food Insecurity (08/03/2023)   Hunger Vital Sign    Worried About Running Out of Food in the Last Year: Never true    Ran Out of Food in the Last Year: Never true  Transportation Needs: No Transportation Needs (08/03/2023)   PRAPARE - Administrator, Civil Service (Medical): No    Lack of Transportation (Non-Medical): No  Physical Activity: Not on file  Stress: Not on file  Social Connections: Moderately Integrated (08/03/2023)   Social Connection and Isolation Panel [NHANES]    Frequency of Communication with Friends and Family: Once a week    Frequency of Social Gatherings with Friends and  Family: Once a week    Attends Religious Services: 1 to 4 times per year    Active Member of Clubs or Organizations: Yes    Attends Banker Meetings: 1 to 4 times per year    Marital Status: Married   Past Medical History:  Past Medical History:  Diagnosis Date   Abnormal Pap smear of cervix 11/11/2015   Acute HFrEF (heart failure with reduced ejection fraction) (HCC) 02/22/2023   Acute ST elevation myocardial infarction (STEMI) of anterior wall (HCC) 02/15/2023   Anxiety    CVA (cerebral infarction)    Depressive disorder    GERD (gastroesophageal reflux disease)    Hyperlipidemia    Hypertension    IFG (impaired fasting glucose)    Osteoporosis    ST elevation myocardial infarction (STEMI) (HCC) 02/17/2023    Past Surgical History:  Procedure  Laterality Date   APPENDECTOMY     CORONARY IMAGING/OCT N/A 02/21/2023   Procedure: CORONARY IMAGING/OCT;  Surgeon: Yvonne Kendall, MD;  Location: [facility] INVASIVE CV LAB;  Service: Cardiovascular;  Laterality: N/A;   CORONARY PRESSURE/FFR STUDY N/A 02/21/2023   Procedure: CORONARY PRESSURE/FFR STUDY;  Surgeon: Yvonne Kendall, MD;  Location: [facility] INVASIVE CV LAB;  Service: Cardiovascular;  Laterality: N/A;   CORONARY STENT INTERVENTION N/A 02/21/2023   Procedure: CORONARY STENT INTERVENTION;  Surgeon: Yvonne Kendall, MD;  Location: [facility] INVASIVE CV LAB;  Service: Cardiovascular;  Laterality: N/A;   CORONARY/GRAFT ACUTE MI REVASCULARIZATION N/A 02/15/2023   Procedure: Coronary/Graft Acute MI Revascularization;  Surgeon: Iran Ouch, MD;  Location: [facility] INVASIVE CV LAB;  Service: Cardiovascular;  Laterality: N/A;   LEFT HEART CATH AND CORONARY ANGIOGRAPHY N/A 02/15/2023   Procedure: LEFT HEART CATH AND CORONARY ANGIOGRAPHY;  Surgeon: Iran Ouch, MD;  Location: [facility] INVASIVE CV LAB;  Service: Cardiovascular;  Laterality: N/A;   LEFT HEART CATH AND CORONARY ANGIOGRAPHY N/A 02/21/2023   Procedure: LEFT HEART CATH AND CORONARY ANGIOGRAPHY;  Surgeon: Yvonne Kendall, MD;  Location: [facility] INVASIVE CV LAB;  Service: Cardiovascular;  Laterality: N/A;   TONSILLECTOMY     TUBAL LIGATION      Current Medications: Current Facility-Administered Medications  Medication Dose Route Frequency Provider Last Rate Last Admin   acetaminophen (TYLENOL) tablet 650 mg  650 mg Oral Q6H PRN Jearld Lesch, NP   650 mg at 08/04/23 0148   alum & mag hydroxide-simeth (MAALOX/MYLANTA) 200-200-20 MG/5ML suspension 30 mL  30 mL Oral Q4H PRN Jearld Lesch, NP       aspirin chewable tablet 81 mg  81 mg Oral Daily Dixon, Rashaun M, NP   81 mg at 08/14/23 0857   atorvastatin (LIPITOR) tablet 80 mg  80 mg Oral Daily Lerry Liner M, NP   80 mg at 08/14/23 0857   carvedilol (COREG) tablet 12.5 mg  12.5 mg Oral BID WC  Dixon, Rashaun M, NP   12.5 mg at 08/14/23 0857   clonazePAM (KLONOPIN) tablet 0.5 mg  0.5 mg Oral Daily Lewanda Rife, MD   0.5 mg at 08/14/23 0857   haloperidol (HALDOL) tablet 5 mg  5 mg Oral TID PRN Jearld Lesch, NP       And   diphenhydrAMINE (BENADRYL) capsule 50 mg  50 mg Oral TID PRN Jearld Lesch, NP       haloperidol lactate (HALDOL) injection 5 mg  5 mg Intramuscular TID PRN Jearld Lesch, NP       And   diphenhydrAMINE (BENADRYL) injection 50 mg  50 mg Intramuscular TID PRN Jearld Lesch, NP       And   LORazepam (ATIVAN) injection 2 mg  2 mg Intramuscular TID PRN Jearld Lesch, NP       haloperidol lactate (HALDOL) injection 10 mg  10 mg Intramuscular TID PRN Jearld Lesch, NP       And   diphenhydrAMINE (BENADRYL) injection 50 mg  50 mg Intramuscular TID PRN Jearld Lesch, NP       And   LORazepam (ATIVAN) injection 2 mg  2 mg Intramuscular TID PRN Durwin Nora, Rashaun M, NP       feeding supplement (ENSURE ENLIVE / ENSURE PLUS) liquid 237 mL  237 mL Oral BID BM Massengill, Harrold Donath, MD   237 mL at 08/13/23 1003   hydrOXYzine (ATARAX) tablet 25 mg  25 mg Oral TID PRN Jearld Lesch, NP   25 mg at 08/13/23 2125   losartan (COZAAR) tablet 50 mg  50 mg Oral Daily Lerry Liner M, NP   50 mg at 08/14/23 0857   magnesium hydroxide (MILK OF MAGNESIA) suspension 30 mL  30 mL Oral Daily PRN Jearld Lesch, NP       multivitamin with minerals tablet 1 tablet  1 tablet Oral Daily Massengill, Nathan, MD   1 tablet at 08/14/23 0857   sertraline (ZOLOFT) tablet 100 mg  100 mg Oral Daily Lewanda Rife, MD   100 mg at 08/14/23 0857   ticagrelor (BRILINTA) tablet 90 mg  90 mg Oral BID Lerry Liner M, NP   90 mg at 08/14/23 0857   traZODone (DESYREL) tablet 50 mg  50 mg Oral QHS PRN Jearld Lesch, NP   50 mg at 08/13/23 2125    Lab Results: No results found for this or any previous visit (from the past 48 hours).  Blood Alcohol level:  Lab Results  Component  Value Date   ETH <10 08/02/2023   ETH <10 05/03/2023    Metabolic Disorder Labs: Lab Results  Component Value Date   HGBA1C 5.4 02/15/2023   MPG 108.28 02/15/2023   No results found for: "PROLACTIN" Lab Results  Component Value Date   CHOL 125 03/14/2023   TRIG 86 03/14/2023   HDL 52 03/14/2023   CHOLHDL 2.4 03/14/2023   VLDL 17 02/15/2023   LDLCALC 56 03/14/2023   LDLCALC 164 (H) 02/15/2023    Physical Findings: AIMS:  , ,  ,  ,    CIWA:    COWS:      Psychiatric Specialty Exam:  Presentation  General Appearance:  Appropriate for Environment; Casual  Eye Contact: Fair  Speech: Clear and Coherent  Speech Volume: Normal    Mood and Affect  Mood: Euthymic  Affect: Appropriate   Thought Process  Thought Processes: -- (impoverished)  Descriptions of Associations:Intact  Orientation:Partial  Thought Content:Logical  Hallucinations:Hallucinations: None  Ideas of Reference:None  Suicidal Thoughts:Suicidal Thoughts: No  Homicidal Thoughts:Homicidal Thoughts: No   Sensorium  Memory: Immediate Fair; Recent Poor; Remote Poor  Judgment: Impaired  Insight: Shallow   Executive Functions  Concentration: Poor  Attention Span: Poor  Recall: Poor  Fund of Knowledge: Fair  Language: Fair   Psychomotor Activity  Psychomotor Activity: Psychomotor Activity: Increased  Musculoskeletal: Strength & Muscle Tone: within normal limits Gait & Station: normal Assets  Assets: Manufacturing systems engineer; Desire for Improvement; Physical Health    Physical Exam: Physical Exam Vitals and nursing note reviewed.  HENT:     Head:  Normocephalic.     Nose: Nose normal.     Mouth/Throat:     Mouth: Mucous membranes are moist.  Eyes:     Pupils: Pupils are equal, round, and reactive to light.  Cardiovascular:     Rate and Rhythm: Normal rate.  Pulmonary:     Breath sounds: Normal breath sounds.  Abdominal:     General: Bowel sounds are  normal.  Skin:    General: Skin is warm.  Neurological:     General: No focal deficit present.     Mental Status: She is alert.    Review of Systems  Constitutional: Negative.   HENT: Negative.    Eyes: Negative.   Respiratory: Negative.    Cardiovascular: Negative.   Gastrointestinal: Negative.   Skin: Negative.   Neurological: Negative.    Blood pressure (!) 152/71, pulse 64, temperature 98.2 F (36.8 C), resp. rate 18, height 5\' 7"  (1.702 m), weight 64.6 kg, SpO2 98%. Body mass index is 22.32 kg/m.  Diagnosis: Principal Problem:   MDD (major depressive disorder)   PLAN: Safety and Monitoring:Patient signed voluntary 08/12/23  -- Voluntary admission to inpatient psychiatric unit for safety, stabilization and treatment  -- Daily contact with patient to assess and evaluate symptoms and progress in treatment  -- Patient's case to be discussed in multi-disciplinary team meeting  -- Observation Level : q15 minute checks  -- Vital signs:  q12 hours  -- Precautions: suicide, elopement, and assault -- Encouraged patient to participate in unit milieu and in scheduled group therapies  2. Psychiatric Diagnoses and Treatment:    Zoloft 100 mg once daily for depression  Klonopin 0.5mg  daily   3. Medical Issues Being Addressed:     4. Discharge Planning:   -- Social work and case management to assist with discharge planning and identification of hospital follow-up needs prior to discharge  -- Estimated LOS: 3-4 days  Verner Chol, MD 08/14/2023, 11:55 AM

## 2023-08-15 ENCOUNTER — Ambulatory Visit: Payer: Medicare Other | Admitting: Psychiatry

## 2023-08-15 DIAGNOSIS — F332 Major depressive disorder, recurrent severe without psychotic features: Secondary | ICD-10-CM | POA: Diagnosis not present

## 2023-08-15 NOTE — Progress Notes (Signed)
[facility] MD Progress Note  08/15/2023 11:35 AM Karina Freeman  MRN:  [id number]  Patient is a 73 year old female presenting to [facility] ED under IVC. Per triage note Pt husband and daughter left pt at home to go run errands and found that pt right ear was bleeding when they returned home and found a wire clothes hanger on the bathroom floor.  Patient was evaluated in the emergency room and admitted to [facility] due to psych unit.  Patient was admitted to the unit in October 2020 for for depression and suicide attempt by overdose.  Subjective:  Chart reviewed, case discussed in multidisciplinary meeting, patient seen during rounds.  Patient is noted to be participating in groups.  She is very pleasant during the conversation.  She remains confused about the triggers and stressors that led up to her suicide attempt and depression.  She states "my husband is nice and I thought everything was okay but I realized it was not okay after he came here ".  Patient is unable to identify and process the life stressors.  She displays significant cognitive delay and thought blocking.  She denies suicidal ideation/HI but is unable to come up with any safety precautions or protective factors that she is looking forward to all will prevent any subsequent suicide attempts.  She is taking her medications with no reported side effects.  Sleep: Fair  Appetite:  Fair  Past Psychiatric History: see h&P Family History:  Family History  Problem Relation Age of Onset   Heart disease Mother    Cancer Mother        breast   Heart disease Father    Heart disease Brother    Bipolar disorder Brother    Heart disease Sister    Breast cancer Neg Hx    Social History:  Social History   Substance and Sexual Activity  Alcohol Use No   Alcohol/week: 0.0 standard drinks of alcohol     Social History   Substance and Sexual Activity  Drug Use No    Social History   Socioeconomic History   Marital status: Married    Spouse name:  [name]   Number of children: 3   Years of education: Not on file   Highest education level: Not on file  Occupational History   Not on file  Tobacco Use   Smoking status: Former    Current packs/day: 0.00    Types: Cigarettes    Quit date: 07/16/1973    Years since quitting: 50.1   Smokeless tobacco: Never  Vaping Use   Vaping status: Never Used  Substance and Sexual Activity   Alcohol use: No    Alcohol/week: 0.0 standard drinks of alcohol   Drug use: No   Sexual activity: Yes  Other Topics Concern   Not on file  Social History Narrative   Not on file   Social Drivers of Health   Financial Resource Strain: Not on file  Food Insecurity: No Food Insecurity (08/03/2023)   Hunger Vital Sign    Worried About Running Out of Food in the Last Year: Never true    Ran Out of Food in the Last Year: Never true  Transportation Needs: No Transportation Needs (08/03/2023)   PRAPARE - Administrator, Civil Service (Medical): No    Lack of Transportation (Non-Medical): No  Physical Activity: Not on file  Stress: Not on file  Social Connections: Moderately Integrated (08/03/2023)   Social Connection and Isolation Panel [  NHANES]    Frequency of Communication with Friends and Family: Once a week    Frequency of Social Gatherings with Friends and Family: Once a week    Attends Religious Services: 1 to 4 times per year    Active Member of Clubs or Organizations: Yes    Attends Banker Meetings: 1 to 4 times per year    Marital Status: Married   Past Medical History:  Past Medical History:  Diagnosis Date   Abnormal Pap smear of cervix 11/11/2015   Acute HFrEF (heart failure with reduced ejection fraction) (HCC) 02/22/2023   Acute ST elevation myocardial infarction (STEMI) of anterior wall (HCC) 02/15/2023   Anxiety    CVA (cerebral infarction)    Depressive disorder    GERD (gastroesophageal reflux disease)    Hyperlipidemia    Hypertension    IFG (impaired  fasting glucose)    Osteoporosis    ST elevation myocardial infarction (STEMI) (HCC) 02/17/2023    Past Surgical History:  Procedure Laterality Date   APPENDECTOMY     CORONARY IMAGING/OCT N/A 02/21/2023   Procedure: CORONARY IMAGING/OCT;  Surgeon: Yvonne Kendall, MD;  Location: [facility] INVASIVE CV LAB;  Service: Cardiovascular;  Laterality: N/A;   CORONARY PRESSURE/FFR STUDY N/A 02/21/2023   Procedure: CORONARY PRESSURE/FFR STUDY;  Surgeon: Yvonne Kendall, MD;  Location: [facility] INVASIVE CV LAB;  Service: Cardiovascular;  Laterality: N/A;   CORONARY STENT INTERVENTION N/A 02/21/2023   Procedure: CORONARY STENT INTERVENTION;  Surgeon: Yvonne Kendall, MD;  Location: [facility] INVASIVE CV LAB;  Service: Cardiovascular;  Laterality: N/A;   CORONARY/GRAFT ACUTE MI REVASCULARIZATION N/A 02/15/2023   Procedure: Coronary/Graft Acute MI Revascularization;  Surgeon: Iran Ouch, MD;  Location: [facility] INVASIVE CV LAB;  Service: Cardiovascular;  Laterality: N/A;   LEFT HEART CATH AND CORONARY ANGIOGRAPHY N/A 02/15/2023   Procedure: LEFT HEART CATH AND CORONARY ANGIOGRAPHY;  Surgeon: Iran Ouch, MD;  Location: [facility] INVASIVE CV LAB;  Service: Cardiovascular;  Laterality: N/A;   LEFT HEART CATH AND CORONARY ANGIOGRAPHY N/A 02/21/2023   Procedure: LEFT HEART CATH AND CORONARY ANGIOGRAPHY;  Surgeon: Yvonne Kendall, MD;  Location: [facility] INVASIVE CV LAB;  Service: Cardiovascular;  Laterality: N/A;   TONSILLECTOMY     TUBAL LIGATION      Current Medications: Current Facility-Administered Medications  Medication Dose Route Frequency Provider Last Rate Last Admin   acetaminophen (TYLENOL) tablet 650 mg  650 mg Oral Q6H PRN Jearld Lesch, NP   650 mg at 08/04/23 0148   alum & mag hydroxide-simeth (MAALOX/MYLANTA) 200-200-20 MG/5ML suspension 30 mL  30 mL Oral Q4H PRN Jearld Lesch, NP       aspirin chewable tablet 81 mg  81 mg Oral Daily Dixon, Rashaun M, NP   81 mg at 08/15/23 1002   atorvastatin (LIPITOR)  tablet 80 mg  80 mg Oral Daily Dixon, Rashaun M, NP   80 mg at 08/15/23 1001   carvedilol (COREG) tablet 12.5 mg  12.5 mg Oral BID WC Dixon, Rashaun M, NP   12.5 mg at 08/14/23 1642   clonazePAM (KLONOPIN) tablet 0.5 mg  0.5 mg Oral Daily Lewanda Rife, MD   0.5 mg at 08/15/23 1001   haloperidol (HALDOL) tablet 5 mg  5 mg Oral TID PRN Jearld Lesch, NP       And   diphenhydrAMINE (BENADRYL) capsule 50 mg  50 mg Oral TID PRN Jearld Lesch, NP       haloperidol lactate (HALDOL) injection 5 mg  5 mg Intramuscular TID PRN Jearld Lesch, NP       And   diphenhydrAMINE (BENADRYL) injection 50 mg  50 mg Intramuscular TID PRN Jearld Lesch, NP       And   LORazepam (ATIVAN) injection 2 mg  2 mg Intramuscular TID PRN Jearld Lesch, NP       haloperidol lactate (HALDOL) injection 10 mg  10 mg Intramuscular TID PRN Jearld Lesch, NP       And   diphenhydrAMINE (BENADRYL) injection 50 mg  50 mg Intramuscular TID PRN Jearld Lesch, NP       And   LORazepam (ATIVAN) injection 2 mg  2 mg Intramuscular TID PRN Jearld Lesch, NP       feeding supplement (ENSURE ENLIVE / ENSURE PLUS) liquid 237 mL  237 mL Oral BID BM Massengill, Harrold Donath, MD   237 mL at 08/15/23 1002   hydrOXYzine (ATARAX) tablet 25 mg  25 mg Oral TID PRN Jearld Lesch, NP   25 mg at 08/14/23 2116   losartan (COZAAR) tablet 50 mg  50 mg Oral Daily Lerry Liner M, NP   50 mg at 08/15/23 1001   magnesium hydroxide (MILK OF MAGNESIA) suspension 30 mL  30 mL Oral Daily PRN Jearld Lesch, NP       multivitamin with minerals tablet 1 tablet  1 tablet Oral Daily Massengill, Nathan, MD   1 tablet at 08/15/23 1005   sertraline (ZOLOFT) tablet 100 mg  100 mg Oral Daily Lewanda Rife, MD   100 mg at 08/15/23 5409   ticagrelor (BRILINTA) tablet 90 mg  90 mg Oral BID Lerry Liner M, NP   90 mg at 08/15/23 1005   traZODone (DESYREL) tablet 50 mg  50 mg Oral QHS PRN Jearld Lesch, NP   50 mg at 08/14/23 2116     Lab Results: No results found for this or any previous visit (from the past 48 hours).  Blood Alcohol level:  Lab Results  Component Value Date   ETH <10 08/02/2023   ETH <10 05/03/2023    Metabolic Disorder Labs: Lab Results  Component Value Date   HGBA1C 5.4 02/15/2023   MPG 108.28 02/15/2023   No results found for: "PROLACTIN" Lab Results  Component Value Date   CHOL 125 03/14/2023   TRIG 86 03/14/2023   HDL 52 03/14/2023   CHOLHDL 2.4 03/14/2023   VLDL 17 02/15/2023   LDLCALC 56 03/14/2023   LDLCALC 164 (H) 02/15/2023    Physical Findings: AIMS:  , ,  ,  ,    CIWA:    COWS:      Psychiatric Specialty Exam:  Presentation  General Appearance:  Appropriate for Environment; Casual  Eye Contact: Fair  Speech: Blocked  Speech Volume: Decreased    Mood and Affect  Mood: Euthymic  Affect: Appropriate; Restricted   Thought Process  Thought Processes: Coherent  Descriptions of Associations:Intact  Orientation:Partial  Thought Content:Logical  Hallucinations:Hallucinations: None  Ideas of Reference:None  Suicidal Thoughts:Suicidal Thoughts: No  Homicidal Thoughts:Homicidal Thoughts: No   Sensorium  Memory: Immediate Fair; Recent Fair; Remote Fair  Judgment: Impaired  Insight: Shallow   Executive Functions  Concentration: Poor  Attention Span: Poor  Recall: Poor  Fund of Knowledge: Poor  Language: Poor   Psychomotor Activity  Psychomotor Activity: Psychomotor Activity: Increased  Musculoskeletal: Strength & Muscle Tone: within normal limits Gait & Station: normal Assets  Assets: Manufacturing systems engineer; Desire for  Improvement; Physical Health    Physical Exam: Physical Exam Vitals and nursing note reviewed.  HENT:     Head: Normocephalic.     Nose: Nose normal.     Mouth/Throat:     Mouth: Mucous membranes are moist.  Eyes:     Pupils: Pupils are equal, round, and reactive to light.   Cardiovascular:     Rate and Rhythm: Normal rate.  Pulmonary:     Breath sounds: Normal breath sounds.  Abdominal:     General: Bowel sounds are normal.  Skin:    General: Skin is warm.  Neurological:     General: No focal deficit present.     Mental Status: She is alert.    Review of Systems  Constitutional: Negative.   HENT: Negative.    Eyes: Negative.   Respiratory: Negative.    Cardiovascular: Negative.   Gastrointestinal: Negative.   Skin: Negative.   Neurological: Negative.    Blood pressure 99/62, pulse 70, temperature 98 F (36.7 C), resp. rate 17, height 5\' 7"  (1.702 m), weight 64.6 kg, SpO2 97%. Body mass index is 22.32 kg/m.  Diagnosis: Principal Problem:   MDD (major depressive disorder)   PLAN: Safety and Monitoring:Patient signed voluntary 08/12/23  -- Involuntary admission to inpatient psychiatric unit for safety, stabilization and treatment  -- Daily contact with patient to assess and evaluate symptoms and progress in treatment  -- Patient's case to be discussed in multi-disciplinary team meeting  -- Observation Level : q15 minute checks  -- Vital signs:  q12 hours  -- Precautions: suicide, elopement, and assault -- Encouraged patient to participate in unit milieu and in scheduled group therapies  2. Psychiatric Diagnoses and Treatment:    Zoloft 100 mg once daily for depression  Klonopin 0.5mg  daily   3. Medical Issues Being Addressed:   No medical emergencies noted at this time  4. Discharge Planning:   -- Social work and case management to assist with discharge planning and identification of hospital follow-up needs prior to discharge  -- Estimated LOS: 3-4 days  Verner Chol, MD 08/15/2023, 11:35 AM

## 2023-08-15 NOTE — Progress Notes (Signed)
   08/15/23 0600  15 Minute Checks  Location Bedroom  Visual Appearance Calm  Behavior Composed  Sleep (Behavioral Health Patients Only)  Calculate sleep? (Click Yes once per 24 hr at 0600 safety check) Yes  Documented sleep last 24 hours 7

## 2023-08-15 NOTE — Plan of Care (Signed)
Problem: Education: Goal: Ability to make informed decisions regarding treatment will improve Outcome: Progressing   Problem: Coping: Goal: Coping ability will improve Outcome: Progressing   Problem: Medication: Goal: Compliance with prescribed medication regimen will improve Outcome: Progressing   Problem: Self-Concept: Goal: Will verbalize positive feelings about self Outcome: Progressing

## 2023-08-15 NOTE — Plan of Care (Signed)
D: Pt alert and oriented. Pt denies experiencing any anxiety/depression at this time. Pt denies experiencing any pain at this time. Pt denies experiencing any SI/HI, or AVH at this time.   A: Scheduled medications administered to pt, per MD orders. Support and encouragement provided. Frequent verbal contact made. Routine safety checks conducted q15 minutes.   R: No adverse drug reactions noted. Pt verbally contracts for safety at this time. Pt compliant with medications and treatment plan. Pt interacts well with others on the unit. Pt remains safe at this time. Plan of care ongoing.  Pt did not receive carvedilol this morning or this evening d/t low bp, MD notified and aware.  Problem: Health Behavior/Discharge Planning: Goal: Identification of resources available to assist in meeting health care needs will improve Outcome: Not Progressing   Problem: Medication: Goal: Compliance with prescribed medication regimen will improve Outcome: Progressing

## 2023-08-15 NOTE — BH IP Treatment Plan (Signed)
Interdisciplinary Treatment and Diagnostic Plan Update  08/15/2023 Time of Session: 10:00 AM  PHILLIPA MORDEN MRN: [id number]  Principal Diagnosis: MDD (major depressive disorder)  Secondary Diagnoses: Principal Problem:   MDD (major depressive disorder)   Current Medications:  Current Facility-Administered Medications  Medication Dose Route Frequency Provider Last Rate Last Admin   acetaminophen (TYLENOL) tablet 650 mg  650 mg Oral Q6H PRN Jearld Lesch, NP   650 mg at 08/04/23 0148   alum & mag hydroxide-simeth (MAALOX/MYLANTA) 200-200-20 MG/5ML suspension 30 mL  30 mL Oral Q4H PRN Jearld Lesch, NP       aspirin chewable tablet 81 mg  81 mg Oral Daily Dixon, Rashaun M, NP   81 mg at 08/15/23 1002   atorvastatin (LIPITOR) tablet 80 mg  80 mg Oral Daily Dixon, Rashaun M, NP   80 mg at 08/15/23 1001   carvedilol (COREG) tablet 12.5 mg  12.5 mg Oral BID WC Dixon, Rashaun M, NP   12.5 mg at 08/14/23 1642   clonazePAM (KLONOPIN) tablet 0.5 mg  0.5 mg Oral Daily Lewanda Rife, MD   0.5 mg at 08/15/23 1001   haloperidol (HALDOL) tablet 5 mg  5 mg Oral TID PRN Jearld Lesch, NP       And   diphenhydrAMINE (BENADRYL) capsule 50 mg  50 mg Oral TID PRN Jearld Lesch, NP       haloperidol lactate (HALDOL) injection 5 mg  5 mg Intramuscular TID PRN Jearld Lesch, NP       And   diphenhydrAMINE (BENADRYL) injection 50 mg  50 mg Intramuscular TID PRN Jearld Lesch, NP       And   LORazepam (ATIVAN) injection 2 mg  2 mg Intramuscular TID PRN Jearld Lesch, NP       haloperidol lactate (HALDOL) injection 10 mg  10 mg Intramuscular TID PRN Jearld Lesch, NP       And   diphenhydrAMINE (BENADRYL) injection 50 mg  50 mg Intramuscular TID PRN Jearld Lesch, NP       And   LORazepam (ATIVAN) injection 2 mg  2 mg Intramuscular TID PRN Durwin Nora, Rashaun M, NP       feeding supplement (ENSURE ENLIVE / ENSURE PLUS) liquid 237 mL  237 mL Oral BID BM Massengill, Harrold Donath, MD   237 mL  at 08/15/23 1002   hydrOXYzine (ATARAX) tablet 25 mg  25 mg Oral TID PRN Jearld Lesch, NP   25 mg at 08/14/23 2116   losartan (COZAAR) tablet 50 mg  50 mg Oral Daily Lerry Liner M, NP   50 mg at 08/15/23 1001   magnesium hydroxide (MILK OF MAGNESIA) suspension 30 mL  30 mL Oral Daily PRN Jearld Lesch, NP       multivitamin with minerals tablet 1 tablet  1 tablet Oral Daily Massengill, Nathan, MD   1 tablet at 08/15/23 1005   sertraline (ZOLOFT) tablet 100 mg  100 mg Oral Daily Lewanda Rife, MD   100 mg at 08/15/23 9562   ticagrelor (BRILINTA) tablet 90 mg  90 mg Oral BID Lerry Liner M, NP   90 mg at 08/15/23 1005   traZODone (DESYREL) tablet 50 mg  50 mg Oral QHS PRN Jearld Lesch, NP   50 mg at 08/14/23 2116   PTA Medications: Medications Prior to Admission  Medication Sig Dispense Refill Last Dose/Taking   amLODipine (NORVASC) 5 MG tablet Take 1 tablet (5  mg total) by mouth at bedtime. 90 tablet 1    aspirin 81 MG chewable tablet Chew 1 tablet (81 mg total) by mouth daily. 90 tablet 3    atorvastatin (LIPITOR) 80 MG tablet Take 1 tablet (80 mg total) by mouth daily. 90 tablet 3    carvedilol (COREG) 12.5 MG tablet Take 1 tablet (12.5 mg total) by mouth 2 (two) times daily with a meal. 180 tablet 3    losartan (COZAAR) 50 MG tablet Take 1 tablet (50 mg total) by mouth daily. 90 tablet 1    nitroGLYCERIN (NITROSTAT) 0.4 MG SL tablet Place 1 tablet (0.4 mg total) under the tongue every 5 (five) minutes as needed for chest pain. 100 tablet 3    sertraline (ZOLOFT) 50 MG tablet Take 1 tablet (50 mg total) by mouth daily. 90 tablet 1    ticagrelor (BRILINTA) 90 MG TABS tablet Take 1 tablet (90 mg total) by mouth 2 (two) times daily. 180 tablet 1    traZODone (DESYREL) 50 MG tablet Take 1 tablet (50 mg total) by mouth at bedtime as needed for sleep. 30 tablet 3     Patient Stressors: Marital or family conflict    Patient Strengths: Capable of independent living  Supportive  family/friends   Treatment Modalities: Medication Management, Group therapy, Case management,  1 to 1 session with clinician, Psychoeducation, Recreational therapy.   Physician Treatment Plan for Primary Diagnosis: MDD (major depressive disorder) Long Term Goal(s): Improvement in symptoms so as ready for discharge   Short Term Goals: Ability to verbalize feelings will improve Ability to disclose and discuss suicidal ideas Ability to demonstrate self-control will improve Ability to identify and develop effective coping behaviors will improve Ability to maintain clinical measurements within normal limits will improve Compliance with prescribed medications will improve Ability to identify triggers associated with substance abuse/mental health issues will improve  Medication Management: Evaluate patient's response, side effects, and tolerance of medication regimen.  Therapeutic Interventions: 1 to 1 sessions, Unit Group sessions and Medication administration.  Evaluation of Outcomes: Progressing  Physician Treatment Plan for Secondary Diagnosis: Principal Problem:   MDD (major depressive disorder)  Long Term Goal(s): Improvement in symptoms so as ready for discharge   Short Term Goals: Ability to verbalize feelings will improve Ability to disclose and discuss suicidal ideas Ability to demonstrate self-control will improve Ability to identify and develop effective coping behaviors will improve Ability to maintain clinical measurements within normal limits will improve Compliance with prescribed medications will improve Ability to identify triggers associated with substance abuse/mental health issues will improve     Medication Management: Evaluate patient's response, side effects, and tolerance of medication regimen.  Therapeutic Interventions: 1 to 1 sessions, Unit Group sessions and Medication administration.  Evaluation of Outcomes: Progressing   RN Treatment Plan for Primary  Diagnosis: MDD (major depressive disorder) Long Term Goal(s): Knowledge of disease and therapeutic regimen to maintain health will improve  Short Term Goals: Ability to remain free from injury will improve, Ability to verbalize frustration and anger appropriately will improve, Ability to demonstrate self-control, Ability to participate in decision making will improve, Ability to verbalize feelings will improve, Ability to disclose and discuss suicidal ideas, Ability to identify and develop effective coping behaviors will improve, and Compliance with prescribed medications will improve  Medication Management: RN will administer medications as ordered by provider, will assess and evaluate patient's response and provide education to patient for prescribed medication. RN will report any adverse and/or side effects to  prescribing provider.  Therapeutic Interventions: 1 on 1 counseling sessions, Psychoeducation, Medication administration, Evaluate responses to treatment, Monitor vital signs and CBGs as ordered, Perform/monitor CIWA, COWS, AIMS and Fall Risk screenings as ordered, Perform wound care treatments as ordered.  Evaluation of Outcomes: Progressing   LCSW Treatment Plan for Primary Diagnosis: MDD (major depressive disorder) Long Term Goal(s): Safe transition to appropriate next level of care at discharge, Engage patient in therapeutic group addressing interpersonal concerns.  Short Term Goals: Engage patient in aftercare planning with referrals and resources, Increase social support, Increase ability to appropriately verbalize feelings, Increase emotional regulation, Facilitate acceptance of mental health diagnosis and concerns, Facilitate patient progression through stages of change regarding substance use diagnoses and concerns, Identify triggers associated with mental health/substance abuse issues, and Increase skills for wellness and recovery  Therapeutic Interventions: Assess for all  discharge needs, 1 to 1 time with Social worker, Explore available resources and support systems, Assess for adequacy in community support network, Educate family and significant other(s) on suicide prevention, Complete Psychosocial Assessment, Interpersonal group therapy.  Evaluation of Outcomes: Progressing   Progress in Treatment: Attending groups: Yes. and No. Participating in groups: Yes. and No. Taking medication as prescribed: Yes. Toleration medication: Yes. Family/Significant other contact made: No, will contact:  CSW will contact husband Syrah Daughtrey  Patient understands diagnosis: Yes. Discussing patient identified problems/goals with staff: No. Medical problems stabilized or resolved: Yes. Denies suicidal/homicidal ideation: Yes. Issues/concerns per patient self-inventory: No. Other: None    New problem(s) identified: No, Describe:  None identified Update 08/15/23: No changes at this time    New Short Term/Long Term Goal(s): elimination of symptoms of psychosis, medication management for mood stabilization; elimination of SI thoughts; development of comprehensive mental wellness plan. Update 08/15/23: No changes at this time    Patient Goals:  Pt declined identifying goals at this time Update 08/15/23: No changes at this time    Discharge Plan or Barriers: CSW will assist with appropriate discharge planning Update 08/15/23: No changes at this time    Reason for Continuation of Hospitalization: Depression Medication stabilization   Estimated Length of Stay: 1 to 7 days Update 08/15/23: TBD   Last 3 [place] Suicide Severity Risk Score: Flowsheet Row Admission (Current) from 08/03/2023 in [facility] BE[facility]AVIORAL MED[facility] ED from 08/02/2023 in [facility] Department at [facility] Admission (Discharged) from 05/04/2023 in [facility] BE[facility]AVIORAL MEDICINE  C-SSRS RISK CATEGORY High Risk High Risk No Risk       Last PHQ 2/9 Scores:    05/28/2023    3:20 PM  04/04/2023    3:55 PM 03/26/2023    1:59 PM  Depression screen PHQ 2/9  Decreased Interest 0 0 1  Down, Depressed, Hopeless 0 0 0  PHQ - 2 Score 0 0 1  Altered sleeping 0 1 1  Tired, decreased energy 0 1 1  Change in appetite 0 0 0  Feeling bad or failure about yourself  0 0 0  Trouble concentrating 0 1 1  Moving slowly or fidgety/restless 0 0 1  Suicidal thoughts 0 0 0  PHQ-9 Score 0 3 5  Difficult doing work/chores Not difficult at all Not difficult at all Not difficult at all    Scribe for Treatment Team: [name], [facility] 08/15/2023 12:29 PM

## 2023-08-15 NOTE — Group Note (Signed)
Date:  08/15/2023 Time:  8:38 PM  Group Topic/Focus:  Goals Group:   The focus of this group is to help patients establish daily goals to achieve during treatment and discuss how the patient can incorporate goal setting into their daily lives to aide in recovery.    Participation Level:  Active  Participation Quality:  Appropriate  Affect:  Appropriate  Cognitive:  Appropriate  Insight: Good  Engagement in Group:  Engaged  Modes of Intervention:  Education  Additional Comments:    Burt Ek 08/15/2023, 8:38 PM

## 2023-08-15 NOTE — Group Note (Signed)
Recreation Therapy Group Note   Group Topic:Self-Esteem  Group Date: 08/15/2023 Start Time: 1400 End Time: 1500 Facilitators: Rosina Lowenstein, LRT, CTRS Location:  Dayroom  Group Description: Positive Affirmation Worksheet. Patients and LRT discussed the importance of self-love/self-esteem and things that cause it to fluctuate, including our mental health. Pt completed a worksheet that helps them identify 24 different strengths and qualities about themselves. Pt encouraged to read aloud at least 2 off their sheet to the group. LRT and pts discussed how this can be applied to daily life post-discharge. LRT and patients played positive affirmation bingo afterwards.  Goal Area(s) Addressed: Patient will identify positive qualities about themselves. Patient will learn new positive affirmations.  Patient will recite positive qualities and affirmations aloud to the group.  Patient will practice positive self-talk.  Patient will increase communication.   Affect/Mood: N/A   Participation Level: Did not attend    Clinical Observations/Individualized Feedback: Patient did not attend group.   Plan: Continue to engage patient in RT group sessions 2-3x/week.   Rosina Lowenstein, LRT, CTRS 08/15/2023 5:09 PM

## 2023-08-15 NOTE — Progress Notes (Addendum)
   08/15/23 2115  Psych Admission Type (Psych Patients Only)  Admission Status Voluntary  Psychosocial Assessment  Patient Complaints None  Eye Contact Fair  Facial Expression Flat  Affect Flat  Speech Soft  Interaction Assertive  Motor Activity Slow  Appearance/Hygiene Unremarkable  Behavior Characteristics Cooperative  Mood Pleasant  Thought Process  Coherency WDL  Content WDL  Delusions None reported or observed  Perception WDL  Hallucination None reported or observed  Judgment Impaired  Confusion None  Danger to Self  Current suicidal ideation? Denies  Danger to Others  Danger to Others None reported or observed   Progress note   D: Pt seen in the dayroom. Pt denies SI, HI, AVH. Pt rates pain  0/10. Pt rates anxiety  0/10 and depression  0/10. Her daughter visited today. Pt said the visit was okay. Interacting with peers appropriately. States that she talked to her husband and he is going to take her with him wherever he goes when he travels. Pt seemed happy about that. No other concerns noted at this time.  A: Pt provided support and encouragement. Pt given scheduled medication as prescribed. PRNs as appropriate. Q15 min checks for safety.   R: Pt safe on the unit. Will continue to monitor.

## 2023-08-16 DIAGNOSIS — F332 Major depressive disorder, recurrent severe without psychotic features: Secondary | ICD-10-CM | POA: Diagnosis not present

## 2023-08-16 NOTE — Progress Notes (Signed)
[facility] MD Progress Note  08/16/2023 3:41 PM Karina Freeman  MRN:  [id number]  Patient is a 73 year old female presenting to [facility] ED under IVC. Per triage note Pt husband and daughter left pt at home to go run errands and found that pt right ear was bleeding when they returned home and found a wire clothes hanger on the bathroom floor.  Patient was evaluated in the emergency room and admitted to [facility] due to psych unit.  Patient was admitted to the unit in October 2020 for for depression and suicide attempt by overdose.  Subjective:  Chart reviewed, case discussed in multidisciplinary meeting, patient seen during rounds.  Patient is noted to be resting in bed.  Significant psychomotor retardation noted.  Not of prompting required to make her answer the questions.  She reports she feels tired.  She did not give a response to most of the questions.  She took a long time to swallow her medicine one by one.  She reports that she is feeling depressed today.  She denies SI/HI/plan.  Per nursing patient is significantly slow with psychomotor retardation today.  She denies auditory/visual hallucinations. Sleep: Fair  Appetite:  Fair  Past Psychiatric History: see h&P Family History:  Family History  Problem Relation Age of Onset   Heart disease Mother    Cancer Mother        breast   Heart disease Father    Heart disease Brother    Bipolar disorder Brother    Heart disease Sister    Breast cancer Neg Hx    Social History:  Social History   Substance and Sexual Activity  Alcohol Use No   Alcohol/week: 0.0 standard drinks of alcohol     Social History   Substance and Sexual Activity  Drug Use No    Social History   Socioeconomic History   Marital status: Married    Spouse name: [name]   Number of children: 3   Years of education: Not on file   Highest education level: Not on file  Occupational History   Not on file  Tobacco Use   Smoking status: Former    Current packs/day: 0.00     Types: Cigarettes    Quit date: 07/16/1973    Years since quitting: 50.1   Smokeless tobacco: Never  Vaping Use   Vaping status: Never Used  Substance and Sexual Activity   Alcohol use: No    Alcohol/week: 0.0 standard drinks of alcohol   Drug use: No   Sexual activity: Yes  Other Topics Concern   Not on file  Social History Narrative   Not on file   Social Drivers of Health   Financial Resource Strain: Not on file  Food Insecurity: No Food Insecurity (08/03/2023)   Hunger Vital Sign    Worried About Running Out of Food in the Last Year: Never true    Ran Out of Food in the Last Year: Never true  Transportation Needs: No Transportation Needs (08/03/2023)   PRAPARE - Administrator, Civil Service (Medical): No    Lack of Transportation (Non-Medical): No  Physical Activity: Not on file  Stress: Not on file  Social Connections: Moderately Integrated (08/03/2023)   Social Connection and Isolation Panel [NHANES]    Frequency of Communication with Friends and Family: Once a week    Frequency of Social Gatherings with Friends and Family: Once a week    Attends Religious Services: 1 to 4 times  per year    Active Member of Clubs or Organizations: Yes    Attends Banker Meetings: 1 to 4 times per year    Marital Status: Married   Past Medical History:  Past Medical History:  Diagnosis Date   Abnormal Pap smear of cervix 11/11/2015   Acute HFrEF (heart failure with reduced ejection fraction) (HCC) 02/22/2023   Acute ST elevation myocardial infarction (STEMI) of anterior wall (HCC) 02/15/2023   Anxiety    CVA (cerebral infarction)    Depressive disorder    GERD (gastroesophageal reflux disease)    Hyperlipidemia    Hypertension    IFG (impaired fasting glucose)    Osteoporosis    ST elevation myocardial infarction (STEMI) (HCC) 02/17/2023    Past Surgical History:  Procedure Laterality Date   APPENDECTOMY     CORONARY IMAGING/OCT N/A 02/21/2023    Procedure: CORONARY IMAGING/OCT;  Surgeon: Yvonne Kendall, MD;  Location: [facility] INVASIVE CV LAB;  Service: Cardiovascular;  Laterality: N/A;   CORONARY PRESSURE/FFR STUDY N/A 02/21/2023   Procedure: CORONARY PRESSURE/FFR STUDY;  Surgeon: Yvonne Kendall, MD;  Location: [facility] INVASIVE CV LAB;  Service: Cardiovascular;  Laterality: N/A;   CORONARY STENT INTERVENTION N/A 02/21/2023   Procedure: CORONARY STENT INTERVENTION;  Surgeon: Yvonne Kendall, MD;  Location: [facility] INVASIVE CV LAB;  Service: Cardiovascular;  Laterality: N/A;   CORONARY/GRAFT ACUTE MI REVASCULARIZATION N/A 02/15/2023   Procedure: Coronary/Graft Acute MI Revascularization;  Surgeon: Iran Ouch, MD;  Location: [facility] INVASIVE CV LAB;  Service: Cardiovascular;  Laterality: N/A;   LEFT HEART CATH AND CORONARY ANGIOGRAPHY N/A 02/15/2023   Procedure: LEFT HEART CATH AND CORONARY ANGIOGRAPHY;  Surgeon: Iran Ouch, MD;  Location: [facility] INVASIVE CV LAB;  Service: Cardiovascular;  Laterality: N/A;   LEFT HEART CATH AND CORONARY ANGIOGRAPHY N/A 02/21/2023   Procedure: LEFT HEART CATH AND CORONARY ANGIOGRAPHY;  Surgeon: Yvonne Kendall, MD;  Location: [facility] INVASIVE CV LAB;  Service: Cardiovascular;  Laterality: N/A;   TONSILLECTOMY     TUBAL LIGATION      Current Medications: Current Facility-Administered Medications  Medication Dose Route Frequency Provider Last Rate Last Admin   acetaminophen (TYLENOL) tablet 650 mg  650 mg Oral Q6H PRN Jearld Lesch, NP   650 mg at 08/04/23 0148   alum & mag hydroxide-simeth (MAALOX/MYLANTA) 200-200-20 MG/5ML suspension 30 mL  30 mL Oral Q4H PRN Jearld Lesch, NP       aspirin chewable tablet 81 mg  81 mg Oral Daily Dixon, Rashaun M, NP   81 mg at 08/16/23 1001   atorvastatin (LIPITOR) tablet 80 mg  80 mg Oral Daily Lerry Liner M, NP   80 mg at 08/16/23 1001   carvedilol (COREG) tablet 12.5 mg  12.5 mg Oral BID WC Dixon, Rashaun M, NP   12.5 mg at 08/16/23 0845   clonazePAM (KLONOPIN)  tablet 0.5 mg  0.5 mg Oral Daily Lewanda Rife, MD   0.5 mg at 08/16/23 1001   haloperidol (HALDOL) tablet 5 mg  5 mg Oral TID PRN Jearld Lesch, NP       And   diphenhydrAMINE (BENADRYL) capsule 50 mg  50 mg Oral TID PRN Jearld Lesch, NP       haloperidol lactate (HALDOL) injection 5 mg  5 mg Intramuscular TID PRN Jearld Lesch, NP       And   diphenhydrAMINE (BENADRYL) injection 50 mg  50 mg Intramuscular TID PRN Jearld Lesch, NP  And   LORazepam (ATIVAN) injection 2 mg  2 mg Intramuscular TID PRN Durwin Nora, Rashaun M, NP       haloperidol lactate (HALDOL) injection 10 mg  10 mg Intramuscular TID PRN Jearld Lesch, NP       And   diphenhydrAMINE (BENADRYL) injection 50 mg  50 mg Intramuscular TID PRN Jearld Lesch, NP       And   LORazepam (ATIVAN) injection 2 mg  2 mg Intramuscular TID PRN Durwin Nora, Rashaun M, NP       feeding supplement (ENSURE ENLIVE / ENSURE PLUS) liquid 237 mL  237 mL Oral BID BM Massengill, Harrold Donath, MD   237 mL at 08/16/23 1011   hydrOXYzine (ATARAX) tablet 25 mg  25 mg Oral TID PRN Jearld Lesch, NP   25 mg at 08/15/23 2124   losartan (COZAAR) tablet 50 mg  50 mg Oral Daily Lerry Liner M, NP   50 mg at 08/16/23 1001   magnesium hydroxide (MILK OF MAGNESIA) suspension 30 mL  30 mL Oral Daily PRN Jearld Lesch, NP       multivitamin with minerals tablet 1 tablet  1 tablet Oral Daily Massengill, Nathan, MD   1 tablet at 08/16/23 1001   sertraline (ZOLOFT) tablet 100 mg  100 mg Oral Daily Lewanda Rife, MD   100 mg at 08/16/23 0845   ticagrelor (BRILINTA) tablet 90 mg  90 mg Oral BID Lerry Liner M, NP   90 mg at 08/16/23 1002   traZODone (DESYREL) tablet 50 mg  50 mg Oral QHS PRN Jearld Lesch, NP   50 mg at 08/15/23 2124    Lab Results: No results found for this or any previous visit (from the past 48 hours).  Blood Alcohol level:  Lab Results  Component Value Date   ETH <10 08/02/2023   ETH <10 05/03/2023    Metabolic  Disorder Labs: Lab Results  Component Value Date   HGBA1C 5.4 02/15/2023   MPG 108.28 02/15/2023   No results found for: "PROLACTIN" Lab Results  Component Value Date   CHOL 125 03/14/2023   TRIG 86 03/14/2023   HDL 52 03/14/2023   CHOLHDL 2.4 03/14/2023   VLDL 17 02/15/2023   LDLCALC 56 03/14/2023   LDLCALC 164 (H) 02/15/2023    Physical Findings: AIMS:  , ,  ,  ,    CIWA:    COWS:      Psychiatric Specialty Exam:  Presentation  General Appearance:  Neat  Eye Contact: Poor  Speech: Slow  Speech Volume: Decreased    Mood and Affect  Mood: Dysphoric  Affect: Flat   Thought Process  Thought Processes: Coherent  Descriptions of Associations:Intact  Orientation:Partial  Thought Content:-- (impoverished)  Hallucinations:Hallucinations: None  Ideas of Reference:None  Suicidal Thoughts:Suicidal Thoughts: No  Homicidal Thoughts:Homicidal Thoughts: No   Sensorium  Memory: Recent Poor; Immediate Poor; Remote Poor  Judgment: Impaired  Insight: Shallow   Executive Functions  Concentration: Poor  Attention Span: Poor  Recall: Poor  Fund of Knowledge: Fair  Language: Fair   Psychomotor Activity  Psychomotor Activity: Psychomotor Activity: Normal  Musculoskeletal: Strength & Muscle Tone: within normal limits Gait & Station: normal Assets  Assets: Manufacturing systems engineer; Financial Resources/Insurance; Physical Health    Physical Exam: Physical Exam Vitals and nursing note reviewed.  HENT:     Head: Normocephalic.     Nose: Nose normal.     Mouth/Throat:     Mouth: Mucous membranes are  moist.  Eyes:     Pupils: Pupils are equal, round, and reactive to light.  Cardiovascular:     Rate and Rhythm: Normal rate.  Pulmonary:     Breath sounds: Normal breath sounds.  Abdominal:     General: Bowel sounds are normal.  Skin:    General: Skin is warm.  Neurological:     General: No focal deficit present.     Mental  Status: She is alert.    Review of Systems  Constitutional: Negative.   HENT: Negative.    Eyes: Negative.   Respiratory: Negative.    Cardiovascular: Negative.   Gastrointestinal: Negative.   Skin: Negative.   Neurological: Negative.    Blood pressure 139/65, pulse 74, temperature 98.2 F (36.8 C), resp. rate 16, height 5\' 7"  (1.702 m), weight 64.6 kg, SpO2 98%. Body mass index is 22.32 kg/m.  Diagnosis: Principal Problem:   MDD (major depressive disorder)   PLAN: Safety and Monitoring:Patient signed voluntary 08/12/23  -- Involuntary admission to inpatient psychiatric unit for safety, stabilization and treatment  -- Daily contact with patient to assess and evaluate symptoms and progress in treatment  -- Patient's case to be discussed in multi-disciplinary team meeting  -- Observation Level : q15 minute checks  -- Vital signs:  q12 hours  -- Precautions: suicide, elopement, and assault -- Encouraged patient to participate in unit milieu and in scheduled group therapies  2. Psychiatric Diagnoses and Treatment:    Zoloft 100 mg once daily for depression  Klonopin 0.5mg  daily   3. Medical Issues Being Addressed:   No medical emergencies noted at this time  4. Discharge Planning:   -- Social work and case management to assist with discharge planning and identification of hospital follow-up needs prior to discharge  -- Estimated LOS: 3-4 days  Verner Chol, MD 08/16/2023, 3:41 PM

## 2023-08-16 NOTE — Group Note (Signed)
Recreation Therapy Group Note   Group Topic:General Recreation  Group Date: 08/16/2023 Start Time: 1330 End Time: 1415 Facilitators: Rosina Lowenstein, LRT, CTRS Location: Courtyard  Group Description: Leisure. Patients were given the opportunity to exercise, play cards, listen to music, or go outside to the courtyard. Collectively, pts chose to go outside to the courtyard to get fresh air and sunlight. Pt identified and conversated about things they enjoy doing in their free time and how they can continue to do that outside of the hospital.   Goal Area(s) Addressed:   Patient will practice making a positive decision.   Patient will have the opportunity to try a new leisure activity.   Patient will communicate with peers and LRT.    Affect/Mood: N/A   Participation Level: Did not attend    Clinical Observations/Individualized Feedback: Patient did not attend group.   Plan: Continue to engage patient in RT group sessions 2-3x/week.   [address] LRT, CTRS 08/16/2023 4:25 PM

## 2023-08-16 NOTE — Group Note (Signed)
Date:  08/16/2023 Time:  9:22 PM  Group Topic/Focus:  Wrap-Up Group:   The focus of this group is to help patients review their daily goal of treatment and discuss progress on daily workbooks.    Participation Level:  Active  Participation Quality:  Appropriate  Affect:  Appropriate  Cognitive:  Appropriate  Insight: Appropriate  Engagement in Group:  Engaged  Modes of Intervention:  Discussion    Lenore Cordia 08/16/2023, 9:22 PM

## 2023-08-16 NOTE — Plan of Care (Signed)
D: Pt alert and oriented. Pt reports experiencing anxiety however, denies experiencing any depression at this time. Pt denies experiencing any pain at this time. Pt denies experiencing any SI/HI, or AVH at this time.   A: Scheduled medications administered to pt, per MD orders. Support and encouragement provided. Frequent verbal contact made. Routine safety checks conducted q15 minutes.   R: No adverse drug reactions noted. Pt verbally contracts for safety at this time. Pt compliant with medications and treatment plan. Pt interacts minimally with others on the unit. Pt remains safe at this time. Plan of care ongoing.  Pt did not want second ensure at this time. Pt still had some left from the ensure earlier. MD notified and aware.  Problem: Medication: Goal: Compliance with prescribed medication regimen will improve Outcome: Progressing   Problem: Self-Concept: Goal: Will verbalize positive feelings about self Outcome: Not Progressing

## 2023-08-16 NOTE — Plan of Care (Signed)
?  Problem: Coping: Goal: Coping ability will improve Outcome: Progressing   Problem: Medication: Goal: Compliance with prescribed medication regimen will improve Outcome: Progressing   Problem: Self-Concept: Goal: Will verbalize positive feelings about self Outcome: Progressing   

## 2023-08-17 ENCOUNTER — Inpatient Hospital Stay: Payer: Medicare Other

## 2023-08-17 DIAGNOSIS — I10 Essential (primary) hypertension: Secondary | ICD-10-CM

## 2023-08-17 DIAGNOSIS — G9340 Encephalopathy, unspecified: Secondary | ICD-10-CM

## 2023-08-17 DIAGNOSIS — I429 Cardiomyopathy, unspecified: Secondary | ICD-10-CM

## 2023-08-17 DIAGNOSIS — E785 Hyperlipidemia, unspecified: Secondary | ICD-10-CM

## 2023-08-17 DIAGNOSIS — F332 Major depressive disorder, recurrent severe without psychotic features: Secondary | ICD-10-CM | POA: Diagnosis not present

## 2023-08-17 HISTORY — DX: Encephalopathy, unspecified: G93.40

## 2023-08-17 LAB — CBC WITH DIFFERENTIAL/PLATELET
Abs Immature Granulocytes: 0.01 10*3/uL (ref 0.00–0.07)
Basophils Absolute: 0.1 10*3/uL (ref 0.0–0.1)
Basophils Relative: 1 %
Eosinophils Absolute: 0.6 10*3/uL — ABNORMAL HIGH (ref 0.0–0.5)
Eosinophils Relative: 8 %
HCT: 32.4 % — ABNORMAL LOW (ref 36.0–46.0)
Hemoglobin: 10.7 g/dL — ABNORMAL LOW (ref 12.0–15.0)
Immature Granulocytes: 0 %
Lymphocytes Relative: 38 %
Lymphs Abs: 2.8 10*3/uL (ref 0.7–4.0)
MCH: 31.6 pg (ref 26.0–34.0)
MCHC: 33 g/dL (ref 30.0–36.0)
MCV: 95.6 fL (ref 80.0–100.0)
Monocytes Absolute: 0.4 10*3/uL (ref 0.1–1.0)
Monocytes Relative: 6 %
Neutro Abs: 3.3 10*3/uL (ref 1.7–7.7)
Neutrophils Relative %: 47 %
Platelets: 223 10*3/uL (ref 150–400)
RBC: 3.39 MIL/uL — ABNORMAL LOW (ref 3.87–5.11)
RDW: 12.9 % (ref 11.5–15.5)
WBC: 7.2 10*3/uL (ref 4.0–10.5)
nRBC: 0 % (ref 0.0–0.2)

## 2023-08-17 LAB — URINALYSIS, COMPLETE (UACMP) WITH MICROSCOPIC
Bilirubin Urine: NEGATIVE
Glucose, UA: NEGATIVE mg/dL
Hgb urine dipstick: NEGATIVE
Ketones, ur: NEGATIVE mg/dL
Nitrite: NEGATIVE
Protein, ur: NEGATIVE mg/dL
Specific Gravity, Urine: 1.016 (ref 1.005–1.030)
pH: 6 (ref 5.0–8.0)

## 2023-08-17 LAB — TROPONIN I (HIGH SENSITIVITY): Troponin I (High Sensitivity): 7 ng/L (ref <18)

## 2023-08-17 LAB — COMPREHENSIVE METABOLIC PANEL
ALT: 22 U/L (ref 0–44)
AST: 23 U/L (ref 15–41)
Albumin: 3.3 g/dL — ABNORMAL LOW (ref 3.5–5.0)
Alkaline Phosphatase: 45 U/L (ref 38–126)
Anion gap: 9 (ref 5–15)
BUN: 24 mg/dL — ABNORMAL HIGH (ref 8–23)
CO2: 26 mmol/L (ref 22–32)
Calcium: 8.4 mg/dL — ABNORMAL LOW (ref 8.9–10.3)
Chloride: 104 mmol/L (ref 98–111)
Creatinine, Ser: 0.99 mg/dL (ref 0.44–1.00)
GFR, Estimated: >60 mL/min (ref >60)
Glucose, Bld: 94 mg/dL (ref 70–99)
Potassium: 3.9 mmol/L (ref 3.5–5.1)
Sodium: 139 mmol/L (ref 135–145)
Total Bilirubin: 0.7 mg/dL (ref 0.0–1.2)
Total Protein: 6.3 g/dL — ABNORMAL LOW (ref 6.5–8.1)

## 2023-08-17 LAB — AMMONIA: Ammonia: 18 umol/L (ref 9–35)

## 2023-08-17 MED ORDER — CLONAZEPAM 0.25 MG PO TBDP
0.1250 mg | ORAL_TABLET | Freq: Every day | ORAL | Status: AC
  Administered 2023-08-17 – 2023-08-18 (×2): 0.125 mg via ORAL
  Filled 2023-08-17 (×4): qty 1

## 2023-08-17 NOTE — Assessment & Plan Note (Signed)
Cr 0.99, GFR >60

## 2023-08-17 NOTE — Assessment & Plan Note (Signed)
 Management per primary team

## 2023-08-17 NOTE — Consult Note (Signed)
Initial Consultation Note   Patient: Karina Freeman HQI:[id number] DOB: 1951-02-04 PCP: Reubin Milan, MD DOA: 08/03/2023 DOS: the patient was seen and examined on 08/17/2023 Primary service: Lewanda Rife, MD  Referring physician: Maximino Greenland MD  Reason for consult: Encephalopathy   Assessment/Plan: Assessment and Plan: Acute encephalopathy Mild generalized lethargy and decreased mood over multiple days Nonfocal neuroexam on evaluation Suspect multifactorial with strong contribution of baseline psychiatric disease Noted recent regular dosing of benzodiazepine which may be confounding issue Appears fairly euvolemic Rule out toxic metabolic and infectious etiologies CT head Chest x-ray, urinalysis CBC and CMP Ammonia level Discussed with psychiatry attending-consider dcing Klonopin Otherwise monitor    Severe major depression without psychotic features (HCC) Management per primary team    Essential hypertension BP stable  Cont current regimen including coreg, cozaar  Ischemic cardiomyopathy 2D ECHO 01/2023 w/ EF 65-70%  Euvolemic  Monitor   Chronic kidney disease, stage 3 (HCC) Cr 0.99, GFR >60   Hyperlipidemia Cont lipitor        TRH will sign off at present, please call us again when needed.  HPI: Karina Freeman is a 73 y.o. female with past medical history of CVA, GERD, hypertension, hyperlipidemia, HFpEF path, bipolar disorder, major depression currently in behavioral health unit for severe depression and bipolar disorder among other issues.  Received request for consult for?  Encephalopathy.  Per report, patient with worsening decreased mood as well as appetite.  No report of confusion.  Patient no longer eating as much.  No reported fevers or chills.  No reported cough or shortness of breath.  Patient denies any chest pain or shortness of breath.  No abdominal pain or diarrhea.  No reported dysuria.  No focal hemiparesis or confusion.  Denies any active  SI/HI.  Is oriented to person and place. Currently in [facility] afebrile, hemodynamically stable. Satting well on RA. WBC 7.2, hgb 10.7, plt 223, Cr 0.99. Ammonia 18. UA, CXR, CT head pending.   Review of Systems: As mentioned in the history of present illness. All other systems reviewed and are negative. Past Medical History:  Diagnosis Date   Abnormal Pap smear of cervix 11/11/2015   Acute HFrEF (heart failure with reduced ejection fraction) (HCC) 02/22/2023   Acute ST elevation myocardial infarction (STEMI) of anterior wall (HCC) 02/15/2023   Anxiety    CVA (cerebral infarction)    Depressive disorder    GERD (gastroesophageal reflux disease)    Hyperlipidemia    Hypertension    IFG (impaired fasting glucose)    Osteoporosis    ST elevation myocardial infarction (STEMI) (HCC) 02/17/2023   Past Surgical History:  Procedure Laterality Date   APPENDECTOMY     CORONARY IMAGING/OCT N/A 02/21/2023   Procedure: CORONARY IMAGING/OCT;  Surgeon: Yvonne Kendall, MD;  Location: [facility] INVASIVE CV LAB;  Service: Cardiovascular;  Laterality: N/A;   CORONARY PRESSURE/FFR STUDY N/A 02/21/2023   Procedure: CORONARY PRESSURE/FFR STUDY;  Surgeon: Yvonne Kendall, MD;  Location: [facility] INVASIVE CV LAB;  Service: Cardiovascular;  Laterality: N/A;   CORONARY STENT INTERVENTION N/A 02/21/2023   Procedure: CORONARY STENT INTERVENTION;  Surgeon: Yvonne Kendall, MD;  Location: [facility] INVASIVE CV LAB;  Service: Cardiovascular;  Laterality: N/A;   CORONARY/GRAFT ACUTE MI REVASCULARIZATION N/A 02/15/2023   Procedure: Coronary/Graft Acute MI Revascularization;  Surgeon: Iran Ouch, MD;  Location: [facility] INVASIVE CV LAB;  Service: Cardiovascular;  Laterality: N/A;   LEFT HEART CATH AND CORONARY ANGIOGRAPHY N/A 02/15/2023   Procedure: LEFT HEART CATH AND CORONARY  ANGIOGRAPHY;  Surgeon: Iran Ouch, MD;  Location: [facility] INVASIVE CV LAB;  Service: Cardiovascular;  Laterality: N/A;   LEFT HEART CATH AND CORONARY ANGIOGRAPHY  N/A 02/21/2023   Procedure: LEFT HEART CATH AND CORONARY ANGIOGRAPHY;  Surgeon: Yvonne Kendall, MD;  Location: [facility] INVASIVE CV LAB;  Service: Cardiovascular;  Laterality: N/A;   TONSILLECTOMY     TUBAL LIGATION     Social History:  reports that she quit smoking about 50 years ago. Her smoking use included cigarettes. She has never used smokeless tobacco. She reports that she does not drink alcohol and does not use drugs.  Allergies  Allergen Reactions   Citalopram Hydrobromide Nausea And Vomiting   Codeine Diarrhea and Nausea And Vomiting   Penicillin G Benzathine     Family History  Problem Relation Age of Onset   Heart disease Mother    Cancer Mother        breast   Heart disease Father    Heart disease Brother    Bipolar disorder Brother    Heart disease Sister    Breast cancer Neg Hx     Prior to Admission medications   Medication Sig Start Date End Date Taking? Authorizing Provider  amLODipine (NORVASC) 5 MG tablet Take 1 tablet (5 mg total) by mouth at bedtime. 03/15/23 08/03/23  Reather Littler D, NP  aspirin 81 MG chewable tablet Chew 1 tablet (81 mg total) by mouth daily. 03/07/23   Reather Littler D, NP  atorvastatin (LIPITOR) 80 MG tablet Take 1 tablet (80 mg total) by mouth daily. 03/07/23 03/06/24  Reather Littler D, NP  carvedilol (COREG) 12.5 MG tablet Take 1 tablet (12.5 mg total) by mouth 2 (two) times daily with a meal. 03/07/23 08/03/23  Reather Littler D, NP  losartan (COZAAR) 50 MG tablet Take 1 tablet (50 mg total) by mouth daily. 05/28/23   Reubin Milan, MD  nitroGLYCERIN (NITROSTAT) 0.4 MG SL tablet Place 1 tablet (0.4 mg total) under the tongue every 5 (five) minutes as needed for chest pain. 02/22/23 02/22/24  Ernestene Mention, MD  sertraline (ZOLOFT) 50 MG tablet Take 1 tablet (50 mg total) by mouth daily. 05/28/23   Reubin Milan, MD  ticagrelor (BRILINTA) 90 MG TABS tablet Take 1 tablet (90 mg total) by mouth 2 (two) times daily. 05/28/23   Reubin Milan, MD   traZODone (DESYREL) 50 MG tablet Take 1 tablet (50 mg total) by mouth at bedtime as needed for sleep. 05/09/23   Sarina Ill, DO    Physical Exam: Vitals:   08/16/23 0739 08/16/23 1630 08/16/23 1954 08/17/23 0726  BP: 139/65 130/65 123/65 (!) 146/69  Pulse: 74 65 63 67  Resp:    16  Temp: 98.2 F (36.8 C)  98.4 F (36.9 C) 98.4 F (36.9 C)  TempSrc:      SpO2: 98%  98% 97%  Weight:      Height:       Physical Exam Constitutional:      Appearance: She is normal weight.  HENT:     Head: Normocephalic and atraumatic.     Nose: Nose normal.     Mouth/Throat:     Mouth: Mucous membranes are moist.  Eyes:     Pupils: Pupils are equal, round, and reactive to light.  Cardiovascular:     Rate and Rhythm: Normal rate and regular rhythm.  Pulmonary:     Effort: Pulmonary effort is normal.     Breath sounds:  No wheezing or rales.  Abdominal:     General: Bowel sounds are normal.  Skin:    General: Skin is warm.  Neurological:     General: No focal deficit present.  Psychiatric:        Mood and Affect: Mood normal.     Data Reviewed:   There are no new results to review at this time.    Family Communication: No family present Primary team communication:  Plan of care discussed w/ Dr. Maximino Greenland MD   Thank you very much for involving [place] in the care of your patient.  Author: Floydene Flock, MD 08/17/2023 1:21 PM  For on call review [email or web address].

## 2023-08-17 NOTE — Assessment & Plan Note (Signed)
BP stable  Cont current regimen including coreg, cozaar

## 2023-08-17 NOTE — Progress Notes (Signed)
 Patient with flat, sad affect.  No verbal responses when assessed by this Clinical research associate.  Endorses sadness  .Denied SI/HI and AVH.  Denies pain.    Compliant with scheduled medications. 15 min checks in place for safety.  Patient isolates to room, but will come to lunch and dinner with encouragement from staff.  Continues to decline EKG.

## 2023-08-17 NOTE — Plan of Care (Signed)
  Problem: Medication: Goal: Compliance with prescribed medication regimen will improve Outcome: Progressing   Problem: Education: Goal: Ability to make informed decisions regarding treatment will improve Outcome: Not Progressing

## 2023-08-17 NOTE — Progress Notes (Signed)
   08/17/23 0646  15 Minute Checks  Location Bedroom  Visual Appearance Calm  Behavior Sleeping  Sleep (Behavioral Health Patients Only)  Calculate sleep? (Click Yes once per 24 hr at 0600 safety check) Yes  Documented sleep last 24 hours 15

## 2023-08-17 NOTE — Plan of Care (Signed)
   Problem: Education: Goal: Ability to make informed decisions regarding treatment will improve Outcome: Progressing   Problem: Coping: Goal: Coping ability will improve Outcome: Progressing   Problem: Health Behavior/Discharge Planning: Goal: Identification of resources available to assist in meeting health care needs will improve Outcome: Progressing   Problem: Medication: Goal: Compliance with prescribed medication regimen will improve Outcome: Progressing   Problem: Self-Concept: Goal: Ability to disclose and discuss suicidal ideas will improve Outcome: Progressing Goal: Will verbalize positive feelings about self Outcome: Progressing

## 2023-08-17 NOTE — Assessment & Plan Note (Addendum)
Mild generalized lethargy and decreased mood over multiple days Nonfocal neuroexam on evaluation Suspect multifactorial with strong contribution of baseline psychiatric disease Noted recent regular dosing of benzodiazepine which may be confounding issue Appears fairly euvolemic Rule out toxic metabolic and infectious etiologies CT head Chest x-ray, urinalysis CBC and CMP Ammonia level Discussed with psychiatry attending-consider dcing Klonopin Otherwise monitor

## 2023-08-17 NOTE — Assessment & Plan Note (Signed)
2D ECHO 01/2023 w/ EF 65-70%  Euvolemic  Monitor

## 2023-08-17 NOTE — Progress Notes (Addendum)
[facility] MD Progress Note  08/17/2023 11:11 AM Karina Freeman  MRN:  [id number]  Patient is a 73 year old female presenting to [facility] ED under IVC. Per triage note Pt husband and daughter left pt at home to go run errands and found that pt right ear was bleeding when they returned home and found a wire clothes hanger on the bathroom floor.  Patient was evaluated in the emergency room and admitted to [facility] due to psych unit.  Patient was admitted to the unit in October 2020 for for depression and suicide attempt by overdose.  Subjective:  Chart reviewed, case discussed in multidisciplinary meeting, patient seen during rounds.  Patient is noted to be laying in bed.  Per nursing report patient did not get up to eat her breakfast.  She took her medicine with a lot of prompting but remained mute.  She did not answer any of the questions regarding her physical status.  She is unable to answer any of the orientation questions.  She nodded her head "yes" when she was asked about depression.  She nodded her head "no" about SI/HI/plan.  Patient is noted to be displaying psychomotor retardation, abrupt change in mental status in the last 24 hours where she has flat affect almost mute and not getting out of the bed at all.  Given her history of recent MI this provider called hospitalist for a consult. Sleep: Fair  Appetite:  Fair  Past Psychiatric History: see h&P Family History:  Family History  Problem Relation Age of Onset   Heart disease Mother    Cancer Mother        breast   Heart disease Father    Heart disease Brother    Bipolar disorder Brother    Heart disease Sister    Breast cancer Neg Hx    Social History:  Social History   Substance and Sexual Activity  Alcohol Use No   Alcohol/week: 0.0 standard drinks of alcohol     Social History   Substance and Sexual Activity  Drug Use No    Social History   Socioeconomic History   Marital status: Married    Spouse name: [name]   Number of  children: 3   Years of education: Not on file   Highest education level: Not on file  Occupational History   Not on file  Tobacco Use   Smoking status: Former    Current packs/day: 0.00    Types: Cigarettes    Quit date: 07/16/1973    Years since quitting: 50.1   Smokeless tobacco: Never  Vaping Use   Vaping status: Never Used  Substance and Sexual Activity   Alcohol use: No    Alcohol/week: 0.0 standard drinks of alcohol   Drug use: No   Sexual activity: Yes  Other Topics Concern   Not on file  Social History Narrative   Not on file   Social Drivers of Health   Financial Resource Strain: Not on file  Food Insecurity: No Food Insecurity (08/03/2023)   Hunger Vital Sign    Worried About Running Out of Food in the Last Year: Never true    Ran Out of Food in the Last Year: Never true  Transportation Needs: No Transportation Needs (08/03/2023)   PRAPARE - Administrator, Civil Service (Medical): No    Lack of Transportation (Non-Medical): No  Physical Activity: Not on file  Stress: Not on file  Social Connections: Moderately Integrated (08/03/2023)  Social Advertising account executive [NHANES]    Frequency of Communication with Friends and Family: Once a week    Frequency of Social Gatherings with Friends and Family: Once a week    Attends Religious Services: 1 to 4 times per year    Active Member of Clubs or Organizations: Yes    Attends Banker Meetings: 1 to 4 times per year    Marital Status: Married   Past Medical History:  Past Medical History:  Diagnosis Date   Abnormal Pap smear of cervix 11/11/2015   Acute HFrEF (heart failure with reduced ejection fraction) (HCC) 02/22/2023   Acute ST elevation myocardial infarction (STEMI) of anterior wall (HCC) 02/15/2023   Anxiety    CVA (cerebral infarction)    Depressive disorder    GERD (gastroesophageal reflux disease)    Hyperlipidemia    Hypertension    IFG (impaired fasting glucose)     Osteoporosis    ST elevation myocardial infarction (STEMI) (HCC) 02/17/2023    Past Surgical History:  Procedure Laterality Date   APPENDECTOMY     CORONARY IMAGING/OCT N/A 02/21/2023   Procedure: CORONARY IMAGING/OCT;  Surgeon: Yvonne Kendall, MD;  Location: [facility] INVASIVE CV LAB;  Service: Cardiovascular;  Laterality: N/A;   CORONARY PRESSURE/FFR STUDY N/A 02/21/2023   Procedure: CORONARY PRESSURE/FFR STUDY;  Surgeon: Yvonne Kendall, MD;  Location: [facility] INVASIVE CV LAB;  Service: Cardiovascular;  Laterality: N/A;   CORONARY STENT INTERVENTION N/A 02/21/2023   Procedure: CORONARY STENT INTERVENTION;  Surgeon: Yvonne Kendall, MD;  Location: [facility] INVASIVE CV LAB;  Service: Cardiovascular;  Laterality: N/A;   CORONARY/GRAFT ACUTE MI REVASCULARIZATION N/A 02/15/2023   Procedure: Coronary/Graft Acute MI Revascularization;  Surgeon: Iran Ouch, MD;  Location: [facility] INVASIVE CV LAB;  Service: Cardiovascular;  Laterality: N/A;   LEFT HEART CATH AND CORONARY ANGIOGRAPHY N/A 02/15/2023   Procedure: LEFT HEART CATH AND CORONARY ANGIOGRAPHY;  Surgeon: Iran Ouch, MD;  Location: [facility] INVASIVE CV LAB;  Service: Cardiovascular;  Laterality: N/A;   LEFT HEART CATH AND CORONARY ANGIOGRAPHY N/A 02/21/2023   Procedure: LEFT HEART CATH AND CORONARY ANGIOGRAPHY;  Surgeon: Yvonne Kendall, MD;  Location: [facility] INVASIVE CV LAB;  Service: Cardiovascular;  Laterality: N/A;   TONSILLECTOMY     TUBAL LIGATION      Current Medications: Current Facility-Administered Medications  Medication Dose Route Frequency Provider Last Rate Last Admin   acetaminophen (TYLENOL) tablet 650 mg  650 mg Oral Q6H PRN Jearld Lesch, NP   650 mg at 08/04/23 0148   alum & mag hydroxide-simeth (MAALOX/MYLANTA) 200-200-20 MG/5ML suspension 30 mL  30 mL Oral Q4H PRN Jearld Lesch, NP       aspirin chewable tablet 81 mg  81 mg Oral Daily Dixon, Rashaun M, NP   81 mg at 08/17/23 1003   atorvastatin (LIPITOR) tablet 80 mg  80 mg  Oral Daily Dixon, Rashaun M, NP   80 mg at 08/17/23 1003   carvedilol (COREG) tablet 12.5 mg  12.5 mg Oral BID WC Dixon, Rashaun M, NP   12.5 mg at 08/17/23 9147   clonazePAM (KLONOPIN) tablet 0.5 mg  0.5 mg Oral Daily Lewanda Rife, MD   0.5 mg at 08/17/23 1003   haloperidol (HALDOL) tablet 5 mg  5 mg Oral TID PRN Jearld Lesch, NP       And   diphenhydrAMINE (BENADRYL) capsule 50 mg  50 mg Oral TID PRN Jearld Lesch, NP       haloperidol  lactate (HALDOL) injection 5 mg  5 mg Intramuscular TID PRN Jearld Lesch, NP       And   diphenhydrAMINE (BENADRYL) injection 50 mg  50 mg Intramuscular TID PRN Jearld Lesch, NP       And   LORazepam (ATIVAN) injection 2 mg  2 mg Intramuscular TID PRN Jearld Lesch, NP       haloperidol lactate (HALDOL) injection 10 mg  10 mg Intramuscular TID PRN Jearld Lesch, NP       And   diphenhydrAMINE (BENADRYL) injection 50 mg  50 mg Intramuscular TID PRN Jearld Lesch, NP       And   LORazepam (ATIVAN) injection 2 mg  2 mg Intramuscular TID PRN Jearld Lesch, NP       feeding supplement (ENSURE ENLIVE / ENSURE PLUS) liquid 237 mL  237 mL Oral BID BM Massengill, Harrold Donath, MD   237 mL at 08/17/23 1013   hydrOXYzine (ATARAX) tablet 25 mg  25 mg Oral TID PRN Jearld Lesch, NP   25 mg at 08/15/23 2124   losartan (COZAAR) tablet 50 mg  50 mg Oral Daily Lerry Liner M, NP   50 mg at 08/17/23 1003   magnesium hydroxide (MILK OF MAGNESIA) suspension 30 mL  30 mL Oral Daily PRN Jearld Lesch, NP       multivitamin with minerals tablet 1 tablet  1 tablet Oral Daily Massengill, Harrold Donath, MD   1 tablet at 08/17/23 1003   sertraline (ZOLOFT) tablet 100 mg  100 mg Oral Daily Lewanda Rife, MD   100 mg at 08/17/23 1610   ticagrelor (BRILINTA) tablet 90 mg  90 mg Oral BID Jearld Lesch, NP   90 mg at 08/17/23 1003   traZODone (DESYREL) tablet 50 mg  50 mg Oral QHS PRN Jearld Lesch, NP   50 mg at 08/15/23 2124    Lab Results:  Results  for orders placed or performed during the hospital encounter of 08/03/23 (from the past 48 hours)  CBC with Differential/Platelet     Status: Abnormal   Collection Time: 08/17/23 10:46 AM  Result Value Ref Range   WBC 7.2 4.0 - 10.5 K/uL   RBC 3.39 (L) 3.87 - 5.11 MIL/uL   Hemoglobin 10.7 (L) 12.0 - 15.0 g/dL   HCT 96.0 (L) 45.4 - 09.8 %   MCV 95.6 80.0 - 100.0 fL   MCH 31.6 26.0 - 34.0 pg   MCHC 33.0 30.0 - 36.0 g/dL   RDW 11.9 14.7 - 82.9 %   Platelets 223 150 - 400 K/uL   nRBC 0.0 0.0 - 0.2 %   Neutrophils Relative % 47 %   Neutro Abs 3.3 1.7 - 7.7 K/uL   Lymphocytes Relative 38 %   Lymphs Abs 2.8 0.7 - 4.0 K/uL   Monocytes Relative 6 %   Monocytes Absolute 0.4 0.1 - 1.0 K/uL   Eosinophils Relative 8 %   Eosinophils Absolute 0.6 (H) 0.0 - 0.5 K/uL   Basophils Relative 1 %   Basophils Absolute 0.1 0.0 - 0.1 K/uL   Immature Granulocytes 0 %   Abs Immature Granulocytes 0.01 0.00 - 0.07 K/uL    Comment: Performed at [facility], [address] [place], [place] [address]  Comprehensive metabolic panel     Status: Abnormal   Collection Time: 08/17/23 10:46 AM  Result Value Ref Range   Sodium 139 135 - 145 mmol/L   Potassium 3.9  3.5 - 5.1 mmol/L   Chloride 104 98 - 111 mmol/L   CO2 26 22 - 32 mmol/L   Glucose, Bld 94 70 - 99 mg/dL    Comment: Glucose reference range applies only to samples taken after fasting for at least 8 hours.   BUN 24 (H) 8 - 23 mg/dL   Creatinine, Ser 2.84 0.44 - 1.00 mg/dL   Calcium 8.4 (L) 8.9 - 10.3 mg/dL   Total Protein 6.3 (L) 6.5 - 8.1 g/dL   Albumin 3.3 (L) 3.5 - 5.0 g/dL   AST 23 15 - 41 U/L   ALT 22 0 - 44 U/L   Alkaline Phosphatase 45 38 - 126 U/L   Total Bilirubin 0.7 0.0 - 1.2 mg/dL   GFR, Estimated >13 >24 mL/min    Comment: (NOTE) Calculated using the CKD-EPI Creatinine Equation (2021)    Anion gap 9 5 - 15    Comment: Performed at [facility], [address] [place], [place] [address]    Blood Alcohol  level:  Lab Results  Component Value Date   [facility] <10 08/02/2023   ETH <10 05/03/2023    Metabolic Disorder Labs: Lab Results  Component Value Date   HGBA1C 5.4 02/15/2023   MPG 108.28 02/15/2023   No results found for: "PROLACTIN" Lab Results  Component Value Date   CHOL 125 03/14/2023   TRIG 86 03/14/2023   HDL 52 03/14/2023   CHOLHDL 2.4 03/14/2023   VLDL 17 02/15/2023   LDLCALC 56 03/14/2023   LDLCALC 164 (H) 02/15/2023    Physical Findings: AIMS:  , ,  ,  ,    CIWA:    COWS:      Psychiatric Specialty Exam:  Presentation  General Appearance:  Neat  Eye Contact: Minimal  Speech: Blocked  Speech Volume: Decreased    Mood and Affect  Mood: Dysphoric  Affect: Depressed; Flat   Thought Process  Thought Processes: -- (impoverished)  Descriptions of Associations:Intact  Orientation:Partial  Thought Content:Scattered  Hallucinations:Hallucinations: None  Ideas of Reference:None  Suicidal Thoughts:Suicidal Thoughts: No  Homicidal Thoughts:Homicidal Thoughts: No   Sensorium  Memory: Recent Poor; Immediate Poor; Remote Poor  Judgment: Impaired  Insight: Shallow   Executive Functions  Concentration: Poor  Attention Span: Poor  Recall: Poor  Fund of Knowledge: Poor  Language: Poor   Psychomotor Activity  Psychomotor Activity: Psychomotor Activity: Normal  Musculoskeletal: Strength & Muscle Tone: within normal limits Gait & Station: normal Assets  Assets: Manufacturing systems engineer; Desire for Improvement; Resilience    Physical Exam: Physical Exam Vitals and nursing note reviewed.  HENT:     Head: Normocephalic.     Nose: Nose normal.     Mouth/Throat:     Mouth: Mucous membranes are moist.  Eyes:     Pupils: Pupils are equal, round, and reactive to light.  Cardiovascular:     Rate and Rhythm: Normal rate.  Pulmonary:     Breath sounds: Normal breath sounds.  Abdominal:     General: Bowel sounds are  normal.  Skin:    General: Skin is warm.  Neurological:     General: No focal deficit present.     Mental Status: She is alert.    Review of Systems  Constitutional: Negative.   HENT: Negative.    Eyes: Negative.   Respiratory: Negative.    Cardiovascular: Negative.   Gastrointestinal: Negative.   Skin: Negative.   Neurological: Negative.    Blood pressure (!) 146/69, pulse 67,  temperature 98.4 F (36.9 C), resp. rate 16, height 5\' 7"  (1.702 m), weight 64.6 kg, SpO2 97%. Body mass index is 22.32 kg/m.  Diagnosis: Principal Problem:   MDD (major depressive disorder)   PLAN: Safety and Monitoring:Patient signed voluntary 08/12/23  -- Involuntary admission to inpatient psychiatric unit for safety, stabilization and treatment  -- Daily contact with patient to assess and evaluate symptoms and progress in treatment  -- Patient's case to be discussed in multi-disciplinary team meeting  -- Observation Level : q15 minute checks  -- Vital signs:  q12 hours  -- Precautions: suicide, elopement, and assault -- Encouraged patient to participate in unit milieu and in scheduled group therapies  2. Psychiatric Diagnoses and Treatment:    Zoloft 100 mg once daily for depression  Klonopin 0.5mg  daily   3. Medical Issues Being Addressed:   Abrupt change in mental status-patient viewed, psychomotor retardation not coming out of the room not answering questions appropriately.  Hospitalist consult was placed.  Stat CBC/CMP/UA/ammonia were ordered  4. Discharge Planning:   -- Social work and case management to assist with discharge planning and identification of hospital follow-up needs prior to discharge  -- Estimated LOS: 3-4 days  Verner Chol, MD 08/17/2023, 11:11 AM

## 2023-08-17 NOTE — Assessment & Plan Note (Signed)
 Cont lipitor

## 2023-08-17 NOTE — Group Note (Signed)
[facility] LCSW Group Therapy Note   Group Date: 08/17/2023 Start Time: 1320 End Time: 1410   Type of Therapy/Topic:  Group Therapy:  Balance in Life  Participation Level:  Did Not Attend   Description of Group:    This group will address the concept of balance and how it feels and looks when one is unbalanced. Patients will be encouraged to process areas in their lives that are out of balance, and identify reasons for remaining unbalanced. Facilitators will guide patients utilizing problem- solving interventions to address and correct the stressor making their life unbalanced. Understanding and applying boundaries will be explored and addressed for obtaining  and maintaining a balanced life. Patients will be encouraged to explore ways to assertively make their unbalanced needs known to significant others in their lives, using other group members and facilitator for support and feedback.  Therapeutic Goals: Patient will identify two or more emotions or situations they have that consume much of in their lives. Patient will identify signs/triggers that life has become out of balance:  Patient will identify two ways to set boundaries in order to achieve balance in their lives:  Patient will demonstrate ability to communicate their needs through discussion and/or role plays  Summary of Patient Progress:    Did Not Attend    Therapeutic Modalities:   Cognitive Behavioral Therapy Solution-Focused Therapy Assertiveness Training   Whitney Post, LCSWA

## 2023-08-18 DIAGNOSIS — F332 Major depressive disorder, recurrent severe without psychotic features: Secondary | ICD-10-CM | POA: Diagnosis not present

## 2023-08-18 NOTE — Plan of Care (Signed)
  Problem: Medication: Goal: Compliance with prescribed medication regimen will improve Outcome: Progressing   Problem: Coping: Goal: Coping ability will improve Outcome: Not Progressing   Problem: Self-Concept: Goal: Ability to disclose and discuss suicidal ideas will improve Outcome: Not Progressing

## 2023-08-18 NOTE — Group Note (Signed)
Date:  08/18/2023 Time:  11:11 PM  Group Topic/Focus:  Wrap-Up Group:   The focus of this group is to help patients review their daily goal of treatment and discuss progress on daily workbooks.    Participation Level:  Active  Participation Quality:  Appropriate  Affect:  Appropriate  Cognitive:  Appropriate  Insight: Appropriate  Engagement in Group:  Engaged  Modes of Intervention:  Discussion  Additional Comments:    Maeola Harman 08/18/2023, 11:11 PM

## 2023-08-18 NOTE — Progress Notes (Signed)
   08/18/23 2300  Psych Admission Type (Psych Patients Only)  Admission Status Voluntary  Psychosocial Assessment  Patient Complaints None  Eye Contact Poor  Facial Expression Flat  Affect Flat  Speech Soft  Interaction Assertive  Motor Activity Slow  Appearance/Hygiene Unremarkable  Behavior Characteristics Appropriate to situation  Mood Sad  Thought Process  Coherency WDL  Content WDL  Delusions None reported or observed  Perception WDL  Hallucination None reported or observed  Judgment Impaired  Confusion Mild  Danger to Self  Current suicidal ideation? Denies  Danger to Others  Danger to Others None reported or observed

## 2023-08-18 NOTE — Progress Notes (Signed)
 Patient cooperative.  Sad, flat affect.  Endorses sadness.  Denies SI/HI and AVH.  Patient told this writer she was sorry and she feels like she is in trouble.  RN assured patient she has done nothing wrong.  Emotional support offered. Denies pain.    Compliant with scheduled medications.  15 min safety checks in place for safety.  Patient isolates to room. Will come to dayroom for meals if staff member goes to patient's room.  No interaction with peers. Minimal interaction with staff.  No initiation.    Patient stayed in the dayroom after dinner.  Minimal interaction observed.

## 2023-08-18 NOTE — Progress Notes (Signed)
[facility] MD Progress Note  08/18/2023 2:22 PM Karina Freeman  MRN:  [id number]  Patient is a 73 year old female presenting to [facility] ED under IVC. Per triage note Pt husband and daughter left pt at home to go run errands and found that pt right ear was bleeding when they returned home and found a wire clothes hanger on the bathroom floor.  Patient was evaluated in the emergency room and admitted to [facility] due to psych unit.  Patient was admitted to the unit in October 2020 for for depression and suicide attempt by overdose.  Subjective:  Chart reviewed, case discussed in multidisciplinary meeting, patient seen during rounds.  Patient is resting in bed.  She reports that she does not feel like talking.  She makes no eye contact even on a lot of prompting she really refuses to open her eyes.  Patient checked for any rigidity in her hands.  Her hand movements had no rigidity.  She sleeps in an frozen position.  She reports that her husband visited her.  She does not give any details and she reports that she wants to talk to her daughter.  She refuses to eat breakfast refuses to get out of her room refuses to interact with the team.  She reports that she feels drained out.  She denies SI/HI/plan.  She denies auditory/visual hallucinations. Sleep: Fair  Appetite:  Fair  Past Psychiatric History: see h&P Family History:  Family History  Problem Relation Age of Onset   Heart disease Mother    Cancer Mother        breast   Heart disease Father    Heart disease Brother    Bipolar disorder Brother    Heart disease Sister    Breast cancer Neg Hx    Social History:  Social History   Substance and Sexual Activity  Alcohol Use No   Alcohol/week: 0.0 standard drinks of alcohol     Social History   Substance and Sexual Activity  Drug Use No    Social History   Socioeconomic History   Marital status: Married    Spouse name: [name]   Number of children: 3   Years of education: Not on file   Highest  education level: Not on file  Occupational History   Not on file  Tobacco Use   Smoking status: Former    Current packs/day: 0.00    Types: Cigarettes    Quit date: 07/16/1973    Years since quitting: 50.1   Smokeless tobacco: Never  Vaping Use   Vaping status: Never Used  Substance and Sexual Activity   Alcohol use: No    Alcohol/week: 0.0 standard drinks of alcohol   Drug use: No   Sexual activity: Yes  Other Topics Concern   Not on file  Social History Narrative   Not on file   Social Drivers of Health   Financial Resource Strain: Not on file  Food Insecurity: No Food Insecurity (08/03/2023)   Hunger Vital Sign    Worried About Running Out of Food in the Last Year: Never true    Ran Out of Food in the Last Year: Never true  Transportation Needs: No Transportation Needs (08/03/2023)   PRAPARE - Administrator, Civil Service (Medical): No    Lack of Transportation (Non-Medical): No  Physical Activity: Not on file  Stress: Not on file  Social Connections: Moderately Integrated (08/03/2023)   Social Connection and Isolation Panel [NHANES]  Frequency of Communication with Friends and Family: Once a week    Frequency of Social Gatherings with Friends and Family: Once a week    Attends Religious Services: 1 to 4 times per year    Active Member of Clubs or Organizations: Yes    Attends Banker Meetings: 1 to 4 times per year    Marital Status: Married   Past Medical History:  Past Medical History:  Diagnosis Date   Abnormal Pap smear of cervix 11/11/2015   Acute HFrEF (heart failure with reduced ejection fraction) (HCC) 02/22/2023   Acute ST elevation myocardial infarction (STEMI) of anterior wall (HCC) 02/15/2023   Anxiety    CVA (cerebral infarction)    Depressive disorder    GERD (gastroesophageal reflux disease)    Hyperlipidemia    Hypertension    IFG (impaired fasting glucose)    Osteoporosis    ST elevation myocardial infarction (STEMI)  (HCC) 02/17/2023    Past Surgical History:  Procedure Laterality Date   APPENDECTOMY     CORONARY IMAGING/OCT N/A 02/21/2023   Procedure: CORONARY IMAGING/OCT;  Surgeon: Yvonne Kendall, MD;  Location: [facility] INVASIVE CV LAB;  Service: Cardiovascular;  Laterality: N/A;   CORONARY PRESSURE/FFR STUDY N/A 02/21/2023   Procedure: CORONARY PRESSURE/FFR STUDY;  Surgeon: Yvonne Kendall, MD;  Location: [facility] INVASIVE CV LAB;  Service: Cardiovascular;  Laterality: N/A;   CORONARY STENT INTERVENTION N/A 02/21/2023   Procedure: CORONARY STENT INTERVENTION;  Surgeon: Yvonne Kendall, MD;  Location: [facility] INVASIVE CV LAB;  Service: Cardiovascular;  Laterality: N/A;   CORONARY/GRAFT ACUTE MI REVASCULARIZATION N/A 02/15/2023   Procedure: Coronary/Graft Acute MI Revascularization;  Surgeon: Iran Ouch, MD;  Location: [facility] INVASIVE CV LAB;  Service: Cardiovascular;  Laterality: N/A;   LEFT HEART CATH AND CORONARY ANGIOGRAPHY N/A 02/15/2023   Procedure: LEFT HEART CATH AND CORONARY ANGIOGRAPHY;  Surgeon: Iran Ouch, MD;  Location: [facility] INVASIVE CV LAB;  Service: Cardiovascular;  Laterality: N/A;   LEFT HEART CATH AND CORONARY ANGIOGRAPHY N/A 02/21/2023   Procedure: LEFT HEART CATH AND CORONARY ANGIOGRAPHY;  Surgeon: Yvonne Kendall, MD;  Location: [facility] INVASIVE CV LAB;  Service: Cardiovascular;  Laterality: N/A;   TONSILLECTOMY     TUBAL LIGATION      Current Medications: Current Facility-Administered Medications  Medication Dose Route Frequency Provider Last Rate Last Admin   acetaminophen (TYLENOL) tablet 650 mg  650 mg Oral Q6H PRN Jearld Lesch, NP   650 mg at 08/04/23 0148   alum & mag hydroxide-simeth (MAALOX/MYLANTA) 200-200-20 MG/5ML suspension 30 mL  30 mL Oral Q4H PRN Jearld Lesch, NP       aspirin chewable tablet 81 mg  81 mg Oral Daily Dixon, Rashaun M, NP   81 mg at 08/18/23 0848   atorvastatin (LIPITOR) tablet 80 mg  80 mg Oral Daily Lerry Liner M, NP   80 mg at 08/18/23 0848    carvedilol (COREG) tablet 12.5 mg  12.5 mg Oral BID WC Dixon, Rashaun M, NP   12.5 mg at 08/18/23 0848   clonazepam (KLONOPIN) disintegrating tablet 0.125 mg  0.125 mg Oral QHS Verner Chol, MD   0.125 mg at 08/17/23 2212   haloperidol (HALDOL) tablet 5 mg  5 mg Oral TID PRN Jearld Lesch, NP       And   diphenhydrAMINE (BENADRYL) capsule 50 mg  50 mg Oral TID PRN Jearld Lesch, NP       haloperidol lactate (HALDOL) injection 5 mg  5 mg  Intramuscular TID PRN Jearld Lesch, NP       And   diphenhydrAMINE (BENADRYL) injection 50 mg  50 mg Intramuscular TID PRN Jearld Lesch, NP       And   LORazepam (ATIVAN) injection 2 mg  2 mg Intramuscular TID PRN Jearld Lesch, NP       haloperidol lactate (HALDOL) injection 10 mg  10 mg Intramuscular TID PRN Jearld Lesch, NP       And   diphenhydrAMINE (BENADRYL) injection 50 mg  50 mg Intramuscular TID PRN Jearld Lesch, NP       And   LORazepam (ATIVAN) injection 2 mg  2 mg Intramuscular TID PRN Jearld Lesch, NP       feeding supplement (ENSURE ENLIVE / ENSURE PLUS) liquid 237 mL  237 mL Oral BID BM Massengill, Harrold Donath, MD   237 mL at 08/18/23 1341   hydrOXYzine (ATARAX) tablet 25 mg  25 mg Oral TID PRN Jearld Lesch, NP   25 mg at 08/15/23 2124   losartan (COZAAR) tablet 50 mg  50 mg Oral Daily Jearld Lesch, NP   50 mg at 08/18/23 0846   magnesium hydroxide (MILK OF MAGNESIA) suspension 30 mL  30 mL Oral Daily PRN Jearld Lesch, NP       multivitamin with minerals tablet 1 tablet  1 tablet Oral Daily Massengill, Nathan, MD   1 tablet at 08/18/23 0848   sertraline (ZOLOFT) tablet 100 mg  100 mg Oral Daily Lewanda Rife, MD   100 mg at 08/18/23 8413   ticagrelor (BRILINTA) tablet 90 mg  90 mg Oral BID Jearld Lesch, NP   90 mg at 08/18/23 0847   traZODone (DESYREL) tablet 50 mg  50 mg Oral QHS PRN Jearld Lesch, NP   50 mg at 08/15/23 2124    Lab Results:  Results for orders placed or performed during the  hospital encounter of 08/03/23 (from the past 48 hours)  Troponin I (High Sensitivity)     Status: None   Collection Time: 08/17/23 10:44 AM  Result Value Ref Range   Troponin I (High Sensitivity) 7 <18 ng/L    Comment: (NOTE) Elevated high sensitivity troponin I (hsTnI) values and significant  changes across serial measurements may suggest ACS but many other  chronic and acute conditions are known to elevate hsTnI results.  Refer to the "Links" section for chest pain algorithms and additional  guidance. Performed at [facility], [address] [place], [place] [address]   Ammonia     Status: None   Collection Time: 08/17/23 10:46 AM  Result Value Ref Range   Ammonia 18 9 - 35 umol/L    Comment: Performed at [facility], [address] [place], [place] [address]  CBC with Differential/Platelet     Status: Abnormal   Collection Time: 08/17/23 10:46 AM  Result Value Ref Range   WBC 7.2 4.0 - 10.5 K/uL   RBC 3.39 (L) 3.87 - 5.11 MIL/uL   Hemoglobin 10.7 (L) 12.0 - 15.0 g/dL   HCT 36.6 (L) 44.0 - 34.7 %   MCV 95.6 80.0 - 100.0 fL   MCH 31.6 26.0 - 34.0 pg   MCHC 33.0 30.0 - 36.0 g/dL   RDW 42.5 95.6 - 38.7 %   Platelets 223 150 - 400 K/uL   nRBC 0.0 0.0 - 0.2 %   Neutrophils Relative % 47 %   Neutro Abs 3.3  1.7 - 7.7 K/uL   Lymphocytes Relative 38 %   Lymphs Abs 2.8 0.7 - 4.0 K/uL   Monocytes Relative 6 %   Monocytes Absolute 0.4 0.1 - 1.0 K/uL   Eosinophils Relative 8 %   Eosinophils Absolute 0.6 (H) 0.0 - 0.5 K/uL   Basophils Relative 1 %   Basophils Absolute 0.1 0.0 - 0.1 K/uL   Immature Granulocytes 0 %   Abs Immature Granulocytes 0.01 0.00 - 0.07 K/uL    Comment: Performed at [facility], [address] [place], [place] [address]  Comprehensive metabolic panel     Status: Abnormal   Collection Time: 08/17/23 10:46 AM  Result Value Ref Range   Sodium 139 135 - 145 mmol/L   Potassium 3.9 3.5 - 5.1 mmol/L   Chloride 104 98 - 111  mmol/L   CO2 26 22 - 32 mmol/L   Glucose, Bld 94 70 - 99 mg/dL    Comment: Glucose reference range applies only to samples taken after fasting for at least 8 hours.   BUN 24 (H) 8 - 23 mg/dL   Creatinine, Ser 6.04 0.44 - 1.00 mg/dL   Calcium 8.4 (L) 8.9 - 10.3 mg/dL   Total Protein 6.3 (L) 6.5 - 8.1 g/dL   Albumin 3.3 (L) 3.5 - 5.0 g/dL   AST 23 15 - 41 U/L   ALT 22 0 - 44 U/L   Alkaline Phosphatase 45 38 - 126 U/L   Total Bilirubin 0.7 0.0 - 1.2 mg/dL   GFR, Estimated >54 >09 mL/min    Comment: (NOTE) Calculated using the CKD-EPI Creatinine Equation (2021)    Anion gap 9 5 - 15    Comment: Performed at [facility], [address] [place], [place] [address]  Urinalysis, Complete w Microscopic -Urine, Random     Status: Abnormal   Collection Time: 08/17/23  3:35 PM  Result Value Ref Range   Color, Urine YELLOW (A) YELLOW   APPearance HAZY (A) CLEAR   Specific Gravity, Urine 1.016 1.005 - 1.030   pH 6.0 5.0 - 8.0   Glucose, UA NEGATIVE NEGATIVE mg/dL   Hgb urine dipstick NEGATIVE NEGATIVE   Bilirubin Urine NEGATIVE NEGATIVE   Ketones, ur NEGATIVE NEGATIVE mg/dL   Protein, ur NEGATIVE NEGATIVE mg/dL   Nitrite NEGATIVE NEGATIVE   Leukocytes,Ua SMALL (A) NEGATIVE   RBC / HPF 0-5 0 - 5 RBC/hpf   WBC, UA 6-10 0 - 5 WBC/hpf   Bacteria, UA RARE (A) NONE SEEN   Squamous Epithelial / HPF 0-5 0 - 5 /HPF   Mucus PRESENT    Hyaline Casts, UA PRESENT     Comment: Performed at [facility], [address] [place], [place] [address]    Blood Alcohol level:  Lab Results  Component Value Date   [facility] <10 08/02/2023   ETH <10 05/03/2023    Metabolic Disorder Labs: Lab Results  Component Value Date   HGBA1C 5.4 02/15/2023   MPG 108.28 02/15/2023   No results found for: "PROLACTIN" Lab Results  Component Value Date   CHOL 125 03/14/2023   TRIG 86 03/14/2023   HDL 52 03/14/2023   CHOLHDL 2.4 03/14/2023   VLDL 17 02/15/2023   LDLCALC 56 03/14/2023    LDLCALC 164 (H) 02/15/2023    Physical Findings: AIMS:  , ,  ,  ,    CIWA:    COWS:      Psychiatric Specialty Exam:  Presentation  General Appearance:  Disheveled  Eye Contact: Minimal  Speech: Blocked  Speech Volume: Decreased    Mood and Affect  Mood: Depressed  Affect: Depressed; Flat   Thought Process  Thought Processes: Irrevelant  Descriptions of Associations:Intact  Orientation:Partial  Thought Content:Illogical  Hallucinations:Hallucinations: None  Ideas of Reference:None  Suicidal Thoughts:Suicidal Thoughts: No  Homicidal Thoughts:Homicidal Thoughts: No   Sensorium  Memory: Immediate Poor; Recent Poor; Remote Poor  Judgment: Impaired  Insight: Shallow   Executive Functions  Concentration: Poor  Attention Span: Poor  Recall: Poor  Fund of Knowledge: Poor  Language: Poor   Psychomotor Activity  Psychomotor Activity: Psychomotor Activity: Normal  Musculoskeletal: Strength & Muscle Tone: within normal limits Gait & Station: normal Assets  Assets: Desire for Improvement; Social Support; Housing    Physical Exam: Physical Exam Vitals and nursing note reviewed.  HENT:     Head: Normocephalic.     Nose: Nose normal.     Mouth/Throat:     Mouth: Mucous membranes are moist.  Eyes:     Pupils: Pupils are equal, round, and reactive to light.  Cardiovascular:     Rate and Rhythm: Normal rate.  Pulmonary:     Breath sounds: Normal breath sounds.  Abdominal:     General: Bowel sounds are normal.  Skin:    General: Skin is warm.  Neurological:     General: No focal deficit present.     Mental Status: She is alert.    Review of Systems  Constitutional: Negative.   HENT: Negative.    Eyes: Negative.   Respiratory: Negative.    Cardiovascular: Negative.   Gastrointestinal: Negative.   Skin: Negative.   Neurological: Negative.    Blood pressure 132/62, pulse 65, temperature (!) 97.3 F (36.3 C),  resp. rate 18, height 5\' 7"  (1.702 m), weight 64.6 kg, SpO2 95%. Body mass index is 22.32 kg/m.  Diagnosis: Principal Problem:   MDD (major depressive disorder) Active Problems:   Hyperlipidemia   Chronic kidney disease, stage 3 (HCC)   Ischemic cardiomyopathy   Essential hypertension   Severe major depression without psychotic features (HCC)   Acute encephalopathy   PLAN: Safety and Monitoring:Patient signed voluntary 08/12/23  -- Involuntary admission to inpatient psychiatric unit for safety, stabilization and treatment  -- Daily contact with patient to assess and evaluate symptoms and progress in treatment  -- Patient's case to be discussed in multi-disciplinary team meeting  -- Observation Level : q15 minute checks  -- Vital signs:  q12 hours  -- Precautions: suicide, elopement, and assault -- Encouraged patient to participate in unit milieu and in scheduled group therapies  2. Psychiatric Diagnoses and Treatment:    Zoloft 100 mg once daily for depression  Klonopin 0.5mg  daily titrated down to 0.125 nightly with plan to discontinue after 3 doses   3. Medical Issues Being Addressed: Hospitalist recommended to stop Klonopin and no other recommendations were provided  Abrupt change in mental status-patient viewed, psychomotor retardation not coming out of the room not answering questions appropriately.  Hospitalist consult was placed.  Stat CBC/CMP/UA/ammonia were ordered FINDINGS: Brain: No evidence of acute infarction, hemorrhage, hydrocephalus, extra-axial collection or mass lesion/mass effect. Extensive low-density in the cerebral white matter attributed to chronic small vessel ischemia. Chronic perforator infarct at the left basal ganglia. Chronic lacunar infarcts in the bilateral deep gray nuclei. Patchy chronic infarction in the right cerebellum as well. Mild for age cerebral volume loss.   Vascular: No hyperdense vessel or unexpected calcification.   Skull: Normal.  Negative for fracture or focal lesion.   Sinuses/Orbits: No acute finding.   IMPRESSION: 1. No acute finding. 2. Extensive chronic small vessel ischemia. 4. Discharge Planning:   -- Social work and case management to assist with discharge planning and identification of hospital follow-up needs prior to discharge  -- Estimated LOS: 3-4 days  Verner Chol, MD 08/18/2023, 2:22 PM

## 2023-08-18 NOTE — Progress Notes (Signed)
Patient alert and oriented. sad and depressed affect. Rates depression level 5/10. Poor eye contact. Speech low and soft. Visible in dayroom. Limited interaction with peers and staff. Visit with spouse. Pt personal belongings laundry provided by staff per spouse request. Scheduled medications administered to patient, per MD orders. Support and encouragement provided.  Routine safety checks conducted every 15 minutes.  Patient informed to notify staff with problems or concerns. Denies SI/HI/AVH and pain. Patient remains safe at this time.

## 2023-08-19 DIAGNOSIS — F332 Major depressive disorder, recurrent severe without psychotic features: Secondary | ICD-10-CM | POA: Diagnosis not present

## 2023-08-19 NOTE — Plan of Care (Signed)
  Problem: Medication: Goal: Compliance with prescribed medication regimen will improve Outcome: Progressing   Problem: Coping: Goal: Coping ability will improve Outcome: Not Progressing   Problem: Self-Concept: Goal: Ability to disclose and discuss suicidal ideas will improve Outcome: Not Progressing

## 2023-08-19 NOTE — Group Note (Signed)
Date:  08/19/2023 Time:  6:23 PM  Group Topic/Focus:  Spirituality:   The focus of this group is to discuss how one's spirituality can aide in recovery.    Participation Level:  Active  Participation Quality:  Appropriate, Attentive, Sharing, and Supportive  Affect:  Appropriate  Cognitive:  Alert, Appropriate, and Oriented  Insight: Appropriate, Good, and Improving  Engagement in Group:  Engaged, Improving, and Supportive  Modes of Intervention:  Socialization  Additional Comments:     Alexis Frock 08/19/2023, 6:23 PM

## 2023-08-19 NOTE — Group Note (Signed)
Recreation Therapy Group Note   Group Topic:Coping Skills  Group Date: 08/19/2023 Start Time: 1100 End Time: 1145 Facilitators: Rosina Lowenstein, LRT, CTRS Location:  Dayroom  Group Description: Mind Map.  Patient was provided a blank template of a diagram with 32 blank boxes in a tiered system, branching from the center (similar to a bubble chart). LRT directed patients to label the middle of the diagram "Coping Skills". LRT and patients then came up with 8 different coping skills as examples. Pt were directed to record their coping skills in the 2nd tier boxes closest to the center.  Patients would then share their coping skills with the group as LRT wrote them out. LRT gave a handout of 99 different coping skills at the end of group.   Goal Area(s) Addressed: Patients will be able to define "coping skills". Patient will identify new coping skills.  Patient will increase communication.   Affect/Mood: Appropriate   Participation Level: Active and Engaged   Participation Quality: Independent   Behavior: Calm, Cooperative, and Reserved   Speech/Thought Process: Coherent   Insight: Fair   Judgement: Good   Modes of Intervention: Clarification, Education, Guided Discussion, Open Conversation, and Worksheet   Patient Response to Interventions:  Attentive, Engaged, Interested , and Receptive   Education Outcome:  Acknowledges education   Clinical Observations/Individualized Feedback: Casi was active in their participation of session activities and group discussion. Pt identified "stepper, walk, drink water" as coping skills. Pt minimally interacted well with LRT and peers duration of session.   Plan: Continue to engage patient in RT group sessions 2-3x/week.   Rosina Lowenstein, LRT, CTRS 08/19/2023 12:44 PM

## 2023-08-19 NOTE — Progress Notes (Signed)
   08/19/23 2200  Psych Admission Type (Psych Patients Only)  Admission Status Voluntary  Psychosocial Assessment  Patient Complaints Anxiety  Eye Contact Poor  Facial Expression Flat  Affect Flat  Speech Soft  Interaction Assertive  Motor Activity Slow  Appearance/Hygiene Unremarkable  Behavior Characteristics Appropriate to situation  Mood Sad;Pleasant  Thought Process  Coherency WDL  Content WDL  Delusions None reported or observed  Perception WDL  Hallucination None reported or observed  Judgment Impaired  Confusion Mild  Danger to Self  Current suicidal ideation? Denies  Danger to Others  Danger to Others None reported or observed

## 2023-08-19 NOTE — Progress Notes (Signed)
   08/19/23 0615  15 Minute Checks  Location Bedroom  Visual Appearance Calm  Behavior Composed  Sleep (Behavioral Health Patients Only)  Calculate sleep? (Click Yes once per 24 hr at 0600 safety check) Yes  Documented sleep last 24 hours 9.75

## 2023-08-19 NOTE — Group Note (Signed)
Recreation Therapy Group Note   Group Topic:General Recreation  Group Date: 08/19/2023 Start Time: 1500 End Time: 1600 Facilitators: Rosina Lowenstein, LRT, CTRS Location: Courtyard  Group Description: Outdoor Recreation. Patients had the option to play corn hole, ring toss, bowling or listening to music while outside in the courtyard getting fresh air and sunlight. LRT and patients discussed things that they enjoy doing in their free time outside of the hospital. LRT encouraged patients to drink water after being active and getting their heart rate up.   Goal Area(s) Addressed: Patient will identify leisure interests.  Patient will practice healthy decision making. Patient will engage in recreation activity.   Affect/Mood: Appropriate   Participation Level: Active   Participation Quality: Independent   Behavior: Appropriate   Speech/Thought Process: Coherent   Insight: Good   Judgement: Good   Modes of Intervention: Activity   Patient Response to Interventions:  Receptive   Education Outcome:  Acknowledges education   Clinical Observations/Individualized Feedback: Karina Freeman was active in their participation of session activities and group discussion. Pt played cornhole and listened to music while outside. Pt was observed smiling largely and dancing in celebration when she made one in the hole. Pt minimally interacted with LRT and peers.    Plan: Continue to engage patient in RT group sessions 2-3x/week.   [address] LRT, CTRS 08/19/2023 5:11 PM

## 2023-08-19 NOTE — Progress Notes (Signed)
[facility] MD Progress Note  08/19/2023 12:28 PM Karina Freeman  MRN:  [id number]  Patient is a 73 year old female presenting to [facility] ED under IVC. Per triage note Pt husband and daughter left pt at home to go run errands and found that pt right ear was bleeding when they returned home and found a wire clothes hanger on the bathroom floor.  Patient was evaluated in the emergency room and admitted to [facility] due to psych unit.  Patient was admitted to the unit in October 2020 for for depression and suicide attempt by overdose.  Subjective: Chart was reviewed and the patient was seen.  The case was discussed with nursing staff and the treatment team.  Staff reports no significant changes since yesterday.  She received no as needed medications last night.  No behavioral problems were noted.  She slept well.  When seen today patient is alert oriented and cooperative but mildly confused.  She was quite hesitant during the interview.  She maintained fair eye contact.  She walked with a walker for steadiness.  She rates her depression at a 2/10.  She denies any suicidal ideations.  She reports that she slept well and her appetite is good.  She does endorse the fact that she lives with her husband who has back problems and is disabled.  She does have 3 children and seems to be in contact with her daughter.  This morning patient was able to get up and have breakfast with the rest of the group.  She continues to interact better with other patients and the staff.  She is not sure why she is here but claims that she might have tried to commit suicide.  Cognitively she is mildly impaired.  Today she denies any active SI/HI/AVH.  Sleep: Fair  Appetite:  Fair  Past Psychiatric History: see h&P Family History:  Family History  Problem Relation Age of Onset   Heart disease Mother    Cancer Mother        breast   Heart disease Father    Heart disease Brother    Bipolar disorder Brother    Heart disease Sister     Breast cancer Neg Hx    Social History:  Social History   Substance and Sexual Activity  Alcohol Use No   Alcohol/week: 0.0 standard drinks of alcohol     Social History   Substance and Sexual Activity  Drug Use No    Social History   Socioeconomic History   Marital status: Married    Spouse name: [name]   Number of children: 3   Years of education: Not on file   Highest education level: Not on file  Occupational History   Not on file  Tobacco Use   Smoking status: Former    Current packs/day: 0.00    Types: Cigarettes    Quit date: 07/16/1973    Years since quitting: 50.1   Smokeless tobacco: Never  Vaping Use   Vaping status: Never Used  Substance and Sexual Activity   Alcohol use: No    Alcohol/week: 0.0 standard drinks of alcohol   Drug use: No   Sexual activity: Yes  Other Topics Concern   Not on file  Social History Narrative   Not on file   Social Drivers of Health   Financial Resource Strain: Not on file  Food Insecurity: No Food Insecurity (08/03/2023)   Hunger Vital Sign    Worried About Running Out of Food in  the Last Year: Never true    Ran Out of Food in the Last Year: Never true  Transportation Needs: No Transportation Needs (08/03/2023)   PRAPARE - Administrator, Civil Service (Medical): No    Lack of Transportation (Non-Medical): No  Physical Activity: Not on file  Stress: Not on file  Social Connections: Moderately Integrated (08/03/2023)   Social Connection and Isolation Panel [NHANES]    Frequency of Communication with Friends and Family: Once a week    Frequency of Social Gatherings with Friends and Family: Once a week    Attends Religious Services: 1 to 4 times per year    Active Member of Clubs or Organizations: Yes    Attends Banker Meetings: 1 to 4 times per year    Marital Status: Married   Past Medical History:  Past Medical History:  Diagnosis Date   Abnormal Pap smear of cervix 11/11/2015   Acute HFrEF  (heart failure with reduced ejection fraction) (HCC) 02/22/2023   Acute ST elevation myocardial infarction (STEMI) of anterior wall (HCC) 02/15/2023   Anxiety    CVA (cerebral infarction)    Depressive disorder    GERD (gastroesophageal reflux disease)    Hyperlipidemia    Hypertension    IFG (impaired fasting glucose)    Osteoporosis    ST elevation myocardial infarction (STEMI) (HCC) 02/17/2023    Past Surgical History:  Procedure Laterality Date   APPENDECTOMY     CORONARY IMAGING/OCT N/A 02/21/2023   Procedure: CORONARY IMAGING/OCT;  Surgeon: Yvonne Kendall, MD;  Location: [facility] INVASIVE CV LAB;  Service: Cardiovascular;  Laterality: N/A;   CORONARY PRESSURE/FFR STUDY N/A 02/21/2023   Procedure: CORONARY PRESSURE/FFR STUDY;  Surgeon: Yvonne Kendall, MD;  Location: [facility] INVASIVE CV LAB;  Service: Cardiovascular;  Laterality: N/A;   CORONARY STENT INTERVENTION N/A 02/21/2023   Procedure: CORONARY STENT INTERVENTION;  Surgeon: Yvonne Kendall, MD;  Location: [facility] INVASIVE CV LAB;  Service: Cardiovascular;  Laterality: N/A;   CORONARY/GRAFT ACUTE MI REVASCULARIZATION N/A 02/15/2023   Procedure: Coronary/Graft Acute MI Revascularization;  Surgeon: Iran Ouch, MD;  Location: [facility] INVASIVE CV LAB;  Service: Cardiovascular;  Laterality: N/A;   LEFT HEART CATH AND CORONARY ANGIOGRAPHY N/A 02/15/2023   Procedure: LEFT HEART CATH AND CORONARY ANGIOGRAPHY;  Surgeon: Iran Ouch, MD;  Location: [facility] INVASIVE CV LAB;  Service: Cardiovascular;  Laterality: N/A;   LEFT HEART CATH AND CORONARY ANGIOGRAPHY N/A 02/21/2023   Procedure: LEFT HEART CATH AND CORONARY ANGIOGRAPHY;  Surgeon: Yvonne Kendall, MD;  Location: [facility] INVASIVE CV LAB;  Service: Cardiovascular;  Laterality: N/A;   TONSILLECTOMY     TUBAL LIGATION      Current Medications: Current Facility-Administered Medications  Medication Dose Route Frequency Provider Last Rate Last Admin   acetaminophen (TYLENOL) tablet 650 mg  650 mg  Oral Q6H PRN Jearld Lesch, NP   650 mg at 08/04/23 0148   alum & mag hydroxide-simeth (MAALOX/MYLANTA) 200-200-20 MG/5ML suspension 30 mL  30 mL Oral Q4H PRN Jearld Lesch, NP       aspirin chewable tablet 81 mg  81 mg Oral Daily Dixon, Rashaun M, NP   81 mg at 08/19/23 0941   atorvastatin (LIPITOR) tablet 80 mg  80 mg Oral Daily Lerry Liner M, NP   80 mg at 08/19/23 0941   carvedilol (COREG) tablet 12.5 mg  12.5 mg Oral BID WC Dixon, Rashaun M, NP   12.5 mg at 08/19/23 0940   clonazepam (KLONOPIN) disintegrating  tablet 0.125 mg  0.125 mg Oral QHS Verner Chol, MD   0.125 mg at 08/18/23 2125   haloperidol (HALDOL) tablet 5 mg  5 mg Oral TID PRN Jearld Lesch, NP       And   diphenhydrAMINE (BENADRYL) capsule 50 mg  50 mg Oral TID PRN Jearld Lesch, NP       haloperidol lactate (HALDOL) injection 5 mg  5 mg Intramuscular TID PRN Jearld Lesch, NP       And   diphenhydrAMINE (BENADRYL) injection 50 mg  50 mg Intramuscular TID PRN Jearld Lesch, NP       And   LORazepam (ATIVAN) injection 2 mg  2 mg Intramuscular TID PRN Jearld Lesch, NP       haloperidol lactate (HALDOL) injection 10 mg  10 mg Intramuscular TID PRN Jearld Lesch, NP       And   diphenhydrAMINE (BENADRYL) injection 50 mg  50 mg Intramuscular TID PRN Jearld Lesch, NP       And   LORazepam (ATIVAN) injection 2 mg  2 mg Intramuscular TID PRN Jearld Lesch, NP       feeding supplement (ENSURE ENLIVE / ENSURE PLUS) liquid 237 mL  237 mL Oral BID BM Massengill, Harrold Donath, MD   237 mL at 08/19/23 0943   hydrOXYzine (ATARAX) tablet 25 mg  25 mg Oral TID PRN Jearld Lesch, NP   25 mg at 08/15/23 2124   losartan (COZAAR) tablet 50 mg  50 mg Oral Daily Lerry Liner M, NP   50 mg at 08/19/23 0940   magnesium hydroxide (MILK OF MAGNESIA) suspension 30 mL  30 mL Oral Daily PRN Jearld Lesch, NP       multivitamin with minerals tablet 1 tablet  1 tablet Oral Daily Massengill, Nathan, MD   1 tablet at  08/19/23 0940   sertraline (ZOLOFT) tablet 100 mg  100 mg Oral Daily Lewanda Rife, MD   100 mg at 08/19/23 0941   ticagrelor (BRILINTA) tablet 90 mg  90 mg Oral BID Jearld Lesch, NP   90 mg at 08/19/23 0941   traZODone (DESYREL) tablet 50 mg  50 mg Oral QHS PRN Jearld Lesch, NP   50 mg at 08/15/23 2124    Lab Results:  Results for orders placed or performed during the hospital encounter of 08/03/23 (from the past 48 hours)  Urinalysis, Complete w Microscopic -Urine, Random     Status: Abnormal   Collection Time: 08/17/23  3:35 PM  Result Value Ref Range   Color, Urine YELLOW (A) YELLOW   APPearance HAZY (A) CLEAR   Specific Gravity, Urine 1.016 1.005 - 1.030   pH 6.0 5.0 - 8.0   Glucose, UA NEGATIVE NEGATIVE mg/dL   Hgb urine dipstick NEGATIVE NEGATIVE   Bilirubin Urine NEGATIVE NEGATIVE   Ketones, ur NEGATIVE NEGATIVE mg/dL   Protein, ur NEGATIVE NEGATIVE mg/dL   Nitrite NEGATIVE NEGATIVE   Leukocytes,Ua SMALL (A) NEGATIVE   RBC / HPF 0-5 0 - 5 RBC/hpf   WBC, UA 6-10 0 - 5 WBC/hpf   Bacteria, UA RARE (A) NONE SEEN   Squamous Epithelial / HPF 0-5 0 - 5 /HPF   Mucus PRESENT    Hyaline Casts, UA PRESENT     Comment: Performed at [facility], [address] [place], [place] [address]    Blood Alcohol level:  Lab Results  Component Value Date   ETH <  10 08/02/2023   ETH <10 05/03/2023    Metabolic Disorder Labs: Lab Results  Component Value Date   HGBA1C 5.4 02/15/2023   MPG 108.28 02/15/2023   No results found for: "PROLACTIN" Lab Results  Component Value Date   CHOL 125 03/14/2023   TRIG 86 03/14/2023   HDL 52 03/14/2023   CHOLHDL 2.4 03/14/2023   VLDL 17 02/15/2023   LDLCALC 56 03/14/2023   LDLCALC 164 (H) 02/15/2023    Physical Findings: AIMS:  , ,  ,  ,    CIWA:    COWS:      Psychiatric Specialty Exam:  Presentation  General Appearance:  Casual; Disheveled  Eye Contact: Fair  Speech: Slow  Speech  Volume: Decreased    Mood and Affect  Mood: Anxious  Affect: Restricted   Thought Process  Thought Processes: Coherent  Descriptions of Associations:Intact  Orientation:Full (Time, Place and Person)  Thought Content:Perseveration; Rumination  Hallucinations:Hallucinations: None  Ideas of Reference:None  Suicidal Thoughts:Suicidal Thoughts: No  Homicidal Thoughts:Homicidal Thoughts: No   Sensorium  Memory: Immediate Poor; Recent Poor; Remote Poor  Judgment: Poor  Insight: Fair   Art therapist  Concentration: Poor  Attention Span: Fair  Recall: Poor  Fund of Knowledge: Poor  Language: Fair   Psychomotor Activity  Psychomotor Activity: Psychomotor Activity: Normal  Musculoskeletal: Strength & Muscle Tone: within normal limits Gait & Station: normal Assets  Assets: Manufacturing systems engineer; Desire for Improvement    Physical Exam: Physical Exam Vitals and nursing note reviewed.  HENT:     Head: Normocephalic.     Nose: Nose normal.     Mouth/Throat:     Mouth: Mucous membranes are moist.  Eyes:     Pupils: Pupils are equal, round, and reactive to light.  Cardiovascular:     Rate and Rhythm: Normal rate.  Pulmonary:     Breath sounds: Normal breath sounds.  Abdominal:     General: Bowel sounds are normal.  Skin:    General: Skin is warm.  Neurological:     General: No focal deficit present.     Mental Status: She is alert.    Review of Systems  Constitutional: Negative.   HENT: Negative.    Eyes: Negative.   Respiratory: Negative.    Cardiovascular: Negative.   Gastrointestinal: Negative.   Skin: Negative.   Neurological: Negative.    Blood pressure (!) 153/68, pulse 64, temperature 98.1 F (36.7 C), resp. rate 14, height 5\' 7"  (1.702 m), weight 64.6 kg, SpO2 98%. Body mass index is 22.32 kg/m.  Diagnosis: Principal Problem:   MDD (major depressive disorder) Active Problems:   Hyperlipidemia   Chronic  kidney disease, stage 3 (HCC)   Ischemic cardiomyopathy   Essential hypertension   Severe major depression without psychotic features (HCC)   Acute encephalopathy   PLAN: Safety and Monitoring:Patient signed voluntary 08/12/23  -- Involuntary admission to inpatient psychiatric unit for safety, stabilization and treatment  -- Daily contact with patient to assess and evaluate symptoms and progress in treatment  -- Patient's case to be discussed in multi-disciplinary team meeting  -- Observation Level : q15 minute checks  -- Vital signs:  q12 hours  -- Precautions: suicide, elopement, and assault -- Encouraged patient to participate in unit milieu and in scheduled group therapies  2. Psychiatric Diagnoses and Treatment:    Zoloft 100 mg once daily for depression  Klonopin 0.5mg  daily titrated down to 0.125 nightly with plan to discontinue after 3 doses  3. Medical Issues Being Addressed: Hospitalist recommended to stop Klonopin and no other recommendations were provided  Abrupt change in mental status-patient viewed, psychomotor retardation not coming out of the room not answering questions appropriately.  Hospitalist consult was placed.  Stat CBC/CMP/UA/ammonia were ordered.(Pending at this time.) FINDINGS: Brain: No evidence of acute infarction, hemorrhage, hydrocephalus, extra-axial collection or mass lesion/mass effect. Extensive low-density in the cerebral white matter attributed to chronic small vessel ischemia. Chronic perforator infarct at the left basal ganglia. Chronic lacunar infarcts in the bilateral deep gray nuclei. Patchy chronic infarction in the right cerebellum as well. Mild for age cerebral volume loss.   Vascular: No hyperdense vessel or unexpected calcification.   Skull: Normal. Negative for fracture or focal lesion.   Sinuses/Orbits: No acute finding.   IMPRESSION: 1. No acute finding. 2. Extensive chronic small vessel ischemia. 4. Discharge Planning:    -- Social work and case management to assist with discharge planning and identification of hospital follow-up needs prior to discharge  -- Estimated LOS: 3-4 days  Rex Kras, MD 08/19/2023, 12:28 PMPatient ID: [name], female   DOB: October 11, 1950, 73 y.o.   MRN: [id number]

## 2023-08-19 NOTE — Group Note (Signed)
Date:  08/20/2023 Time:  12:04 AM  Group Topic/Focus:  Wrap-Up Group:   The focus of this group is to help patients review their daily goal of treatment and discuss progress on daily workbooks.    Participation Level:  Active  Participation Quality:  Appropriate  Affect:  Appropriate  Cognitive:  Appropriate  Insight: Good  Engagement in Group:  Engaged  Modes of Intervention:  Discussion  Additional Comments:    Maeola Harman 08/20/2023, 12:04 AM

## 2023-08-19 NOTE — Progress Notes (Signed)
D: Pt alert and oriented. Pt denies experiencing any anxiety/depression at this time. Pt denies experiencing any pain at this time. Pt denies experiencing any SI/HI, or AVH at this time. Adequate w/ meal intake and hygiene care.    A: Scheduled medications administered to pt, per MD orders. Support and encouragement provided. Frequent verbal contact made. Routine safety checks conducted q15 minutes.   R: No adverse drug reactions noted. Pt verbally contracts for safety at this time. Pt complaint with medications. Pt interacts appropriately with others on the unit. Visited w/ husband. Pt remains safe at this time. Plan of care ongoing.

## 2023-08-20 DIAGNOSIS — F332 Major depressive disorder, recurrent severe without psychotic features: Secondary | ICD-10-CM | POA: Diagnosis not present

## 2023-08-20 NOTE — Progress Notes (Signed)
   08/20/23 0750  Psych Admission Type (Psych Patients Only)  Admission Status Voluntary  Psychosocial Assessment  Patient Complaints Anxiety;Appetite decrease;Depression;Isolation;Loneliness;Sadness  Eye Contact Fair  Facial Expression Flat  Affect Flat  Speech Soft  Interaction Assertive  Motor Activity Slow  Appearance/Hygiene Unremarkable  Behavior Characteristics Cooperative  Mood Pleasant  Thought Process  Coherency WDL  Content WDL  Delusions None reported or observed  Perception WDL  Hallucination None reported or observed  Judgment Impaired  Confusion Mild  Danger to Self  Current suicidal ideation? Denies  Danger to Others  Danger to Others None reported or observed

## 2023-08-20 NOTE — Progress Notes (Signed)
   08/20/23 1610  15 Minute Checks  Location Bedroom  Visual Appearance Calm  Behavior Sleeping  Sleep (Behavioral Health Patients Only)  Calculate sleep? (Click Yes once per 24 hr at 0600 safety check) Yes  Documented sleep last 24 hours 8.75

## 2023-08-20 NOTE — BH IP Treatment Plan (Signed)
 Interdisciplinary Treatment and Diagnostic Plan Update  08/20/2023 Time of Session: 9:00 AM  Karina Freeman MRN: [id number]  Principal Diagnosis: MDD (major depressive disorder)  Secondary Diagnoses: Principal Problem:   MDD (major depressive disorder) Active Problems:   Hyperlipidemia   Chronic kidney disease, stage 3 (HCC)   Ischemic cardiomyopathy   Essential hypertension   Severe major depression without psychotic features (HCC)   Acute encephalopathy   Current Medications:  Current Facility-Administered Medications  Medication Dose Route Frequency Provider Last Rate Last Admin   acetaminophen  (TYLENOL ) tablet 650 mg  650 mg Oral Q6H PRN Melvenia Madelene HERO, NP   650 mg at 08/04/23 0148   alum & mag hydroxide-simeth (MAALOX/MYLANTA) 200-200-20 MG/5ML suspension 30 mL  30 mL Oral Q4H PRN Melvenia Madelene HERO, NP       aspirin  chewable tablet 81 mg  81 mg Oral Daily Dixon, Rashaun M, NP   81 mg at 08/19/23 9058   atorvastatin  (LIPITOR ) tablet 80 mg  80 mg Oral Daily Melvenia Madelene M, NP   80 mg at 08/19/23 0941   carvedilol  (COREG ) tablet 12.5 mg  12.5 mg Oral BID WC Dixon, Rashaun M, NP   12.5 mg at 08/19/23 1740   clonazePAM  (KLONOPIN ) disintegrating tablet 0.125 mg  0.125 mg Oral QHS Jadapalle, Sree, MD   0.125 mg at 08/18/23 2125   haloperidol  (HALDOL ) tablet 5 mg  5 mg Oral TID PRN Melvenia Madelene HERO, NP       And   diphenhydrAMINE  (BENADRYL ) capsule 50 mg  50 mg Oral TID PRN Melvenia Madelene HERO, NP       haloperidol  lactate (HALDOL ) injection 5 mg  5 mg Intramuscular TID PRN Melvenia Madelene HERO, NP       And   diphenhydrAMINE  (BENADRYL ) injection 50 mg  50 mg Intramuscular TID PRN Melvenia Madelene HERO, NP       And   LORazepam  (ATIVAN ) injection 2 mg  2 mg Intramuscular TID PRN Melvenia Madelene HERO, NP       haloperidol  lactate (HALDOL ) injection 10 mg  10 mg Intramuscular TID PRN Melvenia Madelene HERO, NP       And   diphenhydrAMINE  (BENADRYL ) injection 50 mg  50 mg Intramuscular TID PRN Melvenia Madelene HERO, NP       And   LORazepam  (ATIVAN ) injection 2 mg  2 mg Intramuscular TID PRN Melvenia, Rashaun M, NP       feeding supplement (ENSURE ENLIVE / ENSURE PLUS) liquid 237 mL  237 mL Oral BID BM Massengill, Rankin, MD   237 mL at 08/19/23 1450   hydrOXYzine  (ATARAX ) tablet 25 mg  25 mg Oral TID PRN Melvenia Madelene HERO, NP   25 mg at 08/19/23 2128   losartan  (COZAAR ) tablet 50 mg  50 mg Oral Daily Melvenia Madelene M, NP   50 mg at 08/19/23 0940   magnesium  hydroxide (MILK OF MAGNESIA) suspension 30 mL  30 mL Oral Daily PRN Melvenia Madelene HERO, NP       multivitamin with minerals tablet 1 tablet  1 tablet Oral Daily Massengill, Nathan, MD   1 tablet at 08/19/23 0940   sertraline  (ZOLOFT ) tablet 100 mg  100 mg Oral Daily Parmar, Meenakshi, MD   100 mg at 08/19/23 0941   ticagrelor  (BRILINTA ) tablet 90 mg  90 mg Oral BID Melvenia Madelene M, NP   90 mg at 08/19/23 2128   traZODone  (DESYREL ) tablet 50 mg  50 mg Oral QHS PRN Dixon, Rashaun  M, NP   50 mg at 08/19/23 2128   PTA Medications: Medications Prior to Admission  Medication Sig Dispense Refill Last Dose/Taking   amLODipine  (NORVASC ) 5 MG tablet Take 1 tablet (5 mg total) by mouth at bedtime. 90 tablet 1    aspirin  81 MG chewable tablet Chew 1 tablet (81 mg total) by mouth daily. 90 tablet 3    atorvastatin  (LIPITOR ) 80 MG tablet Take 1 tablet (80 mg total) by mouth daily. 90 tablet 3    carvedilol  (COREG ) 12.5 MG tablet Take 1 tablet (12.5 mg total) by mouth 2 (two) times daily with a meal. 180 tablet 3    losartan  (COZAAR ) 50 MG tablet Take 1 tablet (50 mg total) by mouth daily. 90 tablet 1    nitroGLYCERIN  (NITROSTAT ) 0.4 MG SL tablet Place 1 tablet (0.4 mg total) under the tongue every 5 (five) minutes as needed for chest pain. 100 tablet 3    sertraline  (ZOLOFT ) 50 MG tablet Take 1 tablet (50 mg total) by mouth daily. 90 tablet 1    ticagrelor  (BRILINTA ) 90 MG TABS tablet Take 1 tablet (90 mg total) by mouth 2 (two) times daily. 180 tablet 1     traZODone  (DESYREL ) 50 MG tablet Take 1 tablet (50 mg total) by mouth at bedtime as needed for sleep. 30 tablet 3     Patient Stressors: Marital or family conflict    Patient Strengths: Capable of independent living  Supportive family/friends   Treatment Modalities: Medication Management, Group therapy, Case management,  1 to 1 session with clinician, Psychoeducation, Recreational therapy.   Physician Treatment Plan for Primary Diagnosis: MDD (major depressive disorder) Long Term Goal(s): Improvement in symptoms so as ready for discharge   Short Term Goals: Ability to verbalize feelings will improve Ability to disclose and discuss suicidal ideas Ability to demonstrate self-control will improve Ability to identify and develop effective coping behaviors will improve Ability to maintain clinical measurements within normal limits will improve Compliance with prescribed medications will improve Ability to identify triggers associated with substance abuse/mental health issues will improve  Medication Management: Evaluate patient's response, side effects, and tolerance of medication regimen.  Therapeutic Interventions: 1 to 1 sessions, Unit Group sessions and Medication administration.  Evaluation of Outcomes: Progressing  Physician Treatment Plan for Secondary Diagnosis: Principal Problem:   MDD (major depressive disorder) Active Problems:   Hyperlipidemia   Chronic kidney disease, stage 3 (HCC)   Ischemic cardiomyopathy   Essential hypertension   Severe major depression without psychotic features (HCC)   Acute encephalopathy  Long Term Goal(s): Improvement in symptoms so as ready for discharge   Short Term Goals: Ability to verbalize feelings will improve Ability to disclose and discuss suicidal ideas Ability to demonstrate self-control will improve Ability to identify and develop effective coping behaviors will improve Ability to maintain clinical measurements within normal  limits will improve Compliance with prescribed medications will improve Ability to identify triggers associated with substance abuse/mental health issues will improve     Medication Management: Evaluate patient's response, side effects, and tolerance of medication regimen.  Therapeutic Interventions: 1 to 1 sessions, Unit Group sessions and Medication administration.  Evaluation of Outcomes: Progressing   RN Treatment Plan for Primary Diagnosis: MDD (major depressive disorder) Long Term Goal(s): Knowledge of disease and therapeutic regimen to maintain health will improve  Short Term Goals: Ability to remain free from injury will improve, Ability to verbalize frustration and anger appropriately will improve, Ability to demonstrate self-control, Ability to participate  in decision making will improve, Ability to verbalize feelings will improve, Ability to disclose and discuss suicidal ideas, Ability to identify and develop effective coping behaviors will improve, and Compliance with prescribed medications will improve  Medication Management: RN will administer medications as ordered by provider, will assess and evaluate patient's response and provide education to patient for prescribed medication. RN will report any adverse and/or side effects to prescribing provider.  Therapeutic Interventions: 1 on 1 counseling sessions, Psychoeducation, Medication administration, Evaluate responses to treatment, Monitor vital signs and CBGs as ordered, Perform/monitor CIWA, COWS, AIMS and Fall Risk screenings as ordered, Perform wound care treatments as ordered.  Evaluation of Outcomes: Progressing   LCSW Treatment Plan for Primary Diagnosis: MDD (major depressive disorder) Long Term Goal(s): Safe transition to appropriate next level of care at discharge, Engage patient in therapeutic group addressing interpersonal concerns.  Short Term Goals: Engage patient in aftercare planning with referrals and  resources, Increase social support, Increase ability to appropriately verbalize feelings, Increase emotional regulation, Facilitate acceptance of mental health diagnosis and concerns, Facilitate patient progression through stages of change regarding substance use diagnoses and concerns, Identify triggers associated with mental health/substance abuse issues, and Increase skills for wellness and recovery  Therapeutic Interventions: Assess for all discharge needs, 1 to 1 time with Social worker, Explore available resources and support systems, Assess for adequacy in community support network, Educate family and significant other(s) on suicide prevention, Complete Psychosocial Assessment, Interpersonal group therapy.  Evaluation of Outcomes: Progressing   Progress in Treatment: Attending groups: Yes. and No. Participating in groups: Yes. and No. Taking medication as prescribed: Yes. Toleration medication: Yes. Family/Significant other contact made: No, will contact:  CSW will contact husband Zahrah Sutherlin  Patient understands diagnosis: Yes. Discussing patient identified problems/goals with staff: No. Medical problems stabilized or resolved: Yes. Denies suicidal/homicidal ideation: Yes. Issues/concerns per patient self-inventory: No. Other: None    New problem(s) identified: No, Describe:  None identified Update 08/15/23: No changes at this time Update 08/20/23: No changes at this time    New Short Term/Long Term Goal(s): elimination of symptoms of psychosis, medication management for mood stabilization; elimination of SI thoughts; development of comprehensive mental wellness plan. Update 08/15/23: No changes at this time  Update 08/20/23: No changes at this time    Patient Goals:  Pt declined identifying goals at this time Update 08/15/23: No changes at this time  Update 08/20/23: No changes at this time    Discharge Plan or Barriers: CSW will assist with appropriate discharge planning Update 08/15/23: No  changes at this time  Update 08/20/23: No changes at this time    Reason for Continuation of Hospitalization: Depression Medication stabilization   Estimated Length of Stay: 1 to 7 days Update 08/15/23: TBD  Update 08/20/23: TBD  Last 3 [place] Suicide Severity Risk Score: Flowsheet Row Admission (Current) from 08/03/2023 in [facility] ED from 08/02/2023 in [facility] Emergency Department at [facility] Admission (Discharged) from 05/04/2023 in [facility]  C-SSRS RISK CATEGORY High Risk High Risk No Risk       Last PHQ 2/9 Scores:    05/28/2023    3:20 PM 04/04/2023    3:55 PM 03/26/2023    1:59 PM  Depression screen PHQ 2/9  Decreased Interest 0 0 1  Down, Depressed, Hopeless 0 0 0  PHQ - 2 Score 0 0 1  Altered sleeping 0 1 1  Tired, decreased energy 0 1 1  Change in appetite  0 0 0  Feeling bad or failure about yourself  0 0 0  Trouble concentrating 0 1 1  Moving slowly or fidgety/restless 0 0 1  Suicidal thoughts 0 0 0  PHQ-9 Score 0 3 5  Difficult doing work/chores Not difficult at all Not difficult at all Not difficult at all    Scribe for Treatment Team: Lum JONETTA Croft, ISRAEL 08/20/2023 9:53 AM

## 2023-08-20 NOTE — Group Note (Signed)
 Date:  08/20/2023 Time:  9:42 PM  Group Topic/Focus:  Wrap-Up Group:   The focus of this group is to help patients review their daily goal of treatment and discuss progress on daily workbooks.    Participation Level:  Did Not Attend  Participation Quality:      Affect:      Cognitive:      Insight: None  Engagement in Group:      Modes of Intervention:      Additional Comments:    Karina Freeman [name] 08/20/2023, 9:42 PM

## 2023-08-20 NOTE — Progress Notes (Signed)
 Patient is alert and oriented times 3 with periods of confusion noted. Mood and affect flat and depressed. Patient denies pain. She denies SI, HI, and AVH. Also denies feelings of anxiety and depression at this time. Per night nurse report she slept good last night. Morning meds given whole by mouth W/O difficulty. Patient has refused to eat breakfast lunch and dinner. She has not been out of her room but once today and she is tearful. She spoke with her husband earlier today but she hung up on him after calling him a liar. Patient encouraged to eat meals, participate in group, and come out of her room. Patient remains on unit with Q15 minute checks in place.

## 2023-08-20 NOTE — Group Note (Signed)
 Recreation Therapy Group Note   Group Topic:Other  Group Date: 08/20/2023 Start Time: 1430 End Time: 1545 Facilitators: Celestia Jeoffrey BRAVO, LRT, CTRS Location: Courtyard  Group Description: Outdoor Recreation. Patients had the option to play corn hole, ring toss, bowling or listening to music while outside in the courtyard getting fresh air and sunlight. LRT and patients discussed things that they enjoy doing in their free time outside of the hospital. LRT encouraged patients to drink water after being active and getting their heart rate up.   Goal Area(s) Addressed: Patient will identify leisure interests.  Patient will practice healthy decision making. Patient will engage in recreation activity.   Affect/Mood: N/A   Participation Level: Did not attend    Clinical Observations/Individualized Feedback: Patient did not attend group.   Plan: Continue to engage patient in RT group sessions 2-3x/week.   Jeoffrey BRAVO Celestia, LRT, CTRS 08/20/2023 5:35 PM

## 2023-08-20 NOTE — Progress Notes (Signed)
 [facility] MD Progress Note  08/20/2023  Karina Freeman  MRN:  [id number]  Patient is a 73 year old female presenting to [facility] ED under IVC. Per triage note Pt husband and daughter left pt at home to go run errands and found that pt right ear was bleeding when they returned home and found a wire clothes hanger on the bathroom floor.  Patient was evaluated in the emergency room and admitted to [facility] due to psych unit.  Patient was admitted to the unit in October 2020 for for depression and suicide attempt by overdose.  Subjective: Chart was reviewed and the patient was seen.  The case was discussed with nursing staff and the treatment team.  Staff reports that yesterday patient was doing, she was more animated with full range affect.  She was interacting well with peers.  Staff further reported that patient had a visit from her husband yesterday evening, after that patient was tearful and anxious.  Patient was given as needed Vistaril .  During the assessment today, patient was alert oriented and cooperative.  Her responses were slow and delayed.  Her thought process was linear and goal-directed, had on and off thought blocking.  Patient reports that she would like to go home.  We discussed discharge criteria. She was quite hesitant during the interview.  She maintained fair eye contact.She denies any suicidal ideations.  She reports that she slept well and her appetite is good.  Patient has been attending groups.  She denies auditory or visual hallucinations.  Sleep: Fair  Appetite:  Fair  Past Psychiatric History: see h&P Family History:  Family History  Problem Relation Age of Onset   Heart disease Mother    Cancer Mother        breast   Heart disease Father    Heart disease Brother    Bipolar disorder Brother    Heart disease Sister    Breast cancer Neg Hx    Social History:  Social History   Substance and Sexual Activity  Alcohol Use No   Alcohol/week: 0.0 standard drinks of alcohol      Social History   Substance and Sexual Activity  Drug Use No    Social History   Socioeconomic History   Marital status: Married    Spouse name: Marinell   Number of children: 3   Years of education: Not on file   Highest education level: Not on file  Occupational History   Not on file  Tobacco Use   Smoking status: Former    Current packs/day: 0.00    Types: Cigarettes    Quit date: 07/16/1973    Years since quitting: 50.1   Smokeless tobacco: Never  Vaping Use   Vaping status: Never Used  Substance and Sexual Activity   Alcohol use: No    Alcohol/week: 0.0 standard drinks of alcohol   Drug use: No   Sexual activity: Yes  Other Topics Concern   Not on file  Social History Narrative   Not on file   Social Drivers of Health   Financial Resource Strain: Not on file  Food Insecurity: No Food Insecurity (08/03/2023)   Hunger Vital Sign    Worried About Running Out of Food in the Last Year: Never true    Ran Out of Food in the Last Year: Never true  Transportation Needs: No Transportation Needs (08/03/2023)   PRAPARE - Administrator, Civil Service (Medical): No    Lack of Transportation (Non-Medical):  No  Physical Activity: Not on file  Stress: Not on file  Social Connections: Moderately Integrated (08/03/2023)   Social Connection and Isolation Panel [NHANES]    Frequency of Communication with Friends and Family: Once a week    Frequency of Social Gatherings with Friends and Family: Once a week    Attends Religious Services: 1 to 4 times per year    Active Member of Clubs or Organizations: Yes    Attends Banker Meetings: 1 to 4 times per year    Marital Status: Married   Past Medical History:  Past Medical History:  Diagnosis Date   Abnormal Pap smear of cervix 11/11/2015   Acute HFrEF (heart failure with reduced ejection fraction) (HCC) 02/22/2023   Acute ST elevation myocardial infarction (STEMI) of anterior wall (HCC) 02/15/2023   Anxiety     CVA (cerebral infarction)    Depressive disorder    GERD (gastroesophageal reflux disease)    Hyperlipidemia    Hypertension    IFG (impaired fasting glucose)    Osteoporosis    ST elevation myocardial infarction (STEMI) (HCC) 02/17/2023    Past Surgical History:  Procedure Laterality Date   APPENDECTOMY     CORONARY IMAGING/OCT N/A 02/21/2023   Procedure: CORONARY IMAGING/OCT;  Surgeon: Mady Bruckner, MD;  Location: [facility] INVASIVE CV LAB;  Service: Cardiovascular;  Laterality: N/A;   CORONARY PRESSURE/FFR STUDY N/A 02/21/2023   Procedure: CORONARY PRESSURE/FFR STUDY;  Surgeon: Mady Bruckner, MD;  Location: [facility] INVASIVE CV LAB;  Service: Cardiovascular;  Laterality: N/A;   CORONARY STENT INTERVENTION N/A 02/21/2023   Procedure: CORONARY STENT INTERVENTION;  Surgeon: Mady Bruckner, MD;  Location: [facility] INVASIVE CV LAB;  Service: Cardiovascular;  Laterality: N/A;   CORONARY/GRAFT ACUTE MI REVASCULARIZATION N/A 02/15/2023   Procedure: Coronary/Graft Acute MI Revascularization;  Surgeon: Darron Deatrice LABOR, MD;  Location: [facility] INVASIVE CV LAB;  Service: Cardiovascular;  Laterality: N/A;   LEFT HEART CATH AND CORONARY ANGIOGRAPHY N/A 02/15/2023   Procedure: LEFT HEART CATH AND CORONARY ANGIOGRAPHY;  Surgeon: Darron Deatrice LABOR, MD;  Location: [facility] INVASIVE CV LAB;  Service: Cardiovascular;  Laterality: N/A;   LEFT HEART CATH AND CORONARY ANGIOGRAPHY N/A 02/21/2023   Procedure: LEFT HEART CATH AND CORONARY ANGIOGRAPHY;  Surgeon: Mady Bruckner, MD;  Location: [facility] INVASIVE CV LAB;  Service: Cardiovascular;  Laterality: N/A;   TONSILLECTOMY     TUBAL LIGATION      Current Medications: Current Facility-Administered Medications  Medication Dose Route Frequency Provider Last Rate Last Admin   acetaminophen  (TYLENOL ) tablet 650 mg  650 mg Oral Q6H PRN Melvenia Madelene HERO, NP   650 mg at 08/04/23 0148   alum & mag hydroxide-simeth (MAALOX/MYLANTA) 200-200-20 MG/5ML suspension 30 mL  30 mL Oral Q4H  PRN Melvenia Madelene HERO, NP       aspirin  chewable tablet 81 mg  81 mg Oral Daily Dixon, Rashaun M, NP   81 mg at 08/20/23 1023   atorvastatin  (LIPITOR ) tablet 80 mg  80 mg Oral Daily Melvenia Madelene M, NP   80 mg at 08/20/23 1023   carvedilol  (COREG ) tablet 12.5 mg  12.5 mg Oral BID WC Dixon, Rashaun M, NP   12.5 mg at 08/20/23 1023   clonazePAM  (KLONOPIN ) disintegrating tablet 0.125 mg  0.125 mg Oral QHS Jadapalle, Sree, MD   0.125 mg at 08/18/23 2125   haloperidol  (HALDOL ) tablet 5 mg  5 mg Oral TID PRN Melvenia Madelene HERO, NP       And   diphenhydrAMINE  (  BENADRYL ) capsule 50 mg  50 mg Oral TID PRN Melvenia Madelene HERO, NP       haloperidol  lactate (HALDOL ) injection 5 mg  5 mg Intramuscular TID PRN Melvenia Madelene HERO, NP       And   diphenhydrAMINE  (BENADRYL ) injection 50 mg  50 mg Intramuscular TID PRN Melvenia Madelene HERO, NP       And   LORazepam  (ATIVAN ) injection 2 mg  2 mg Intramuscular TID PRN Melvenia Madelene HERO, NP       haloperidol  lactate (HALDOL ) injection 10 mg  10 mg Intramuscular TID PRN Melvenia Madelene HERO, NP       And   diphenhydrAMINE  (BENADRYL ) injection 50 mg  50 mg Intramuscular TID PRN Melvenia Madelene HERO, NP       And   LORazepam  (ATIVAN ) injection 2 mg  2 mg Intramuscular TID PRN Melvenia, Rashaun M, NP       feeding supplement (ENSURE ENLIVE / ENSURE PLUS) liquid 237 mL  237 mL Oral BID BM Massengill, Rankin, MD   237 mL at 08/20/23 1032   hydrOXYzine  (ATARAX ) tablet 25 mg  25 mg Oral TID PRN Melvenia Madelene HERO, NP   25 mg at 08/20/23 1023   losartan  (COZAAR ) tablet 50 mg  50 mg Oral Daily Melvenia Madelene M, NP   50 mg at 08/20/23 1029   magnesium  hydroxide (MILK OF MAGNESIA) suspension 30 mL  30 mL Oral Daily PRN Melvenia Madelene HERO, NP       multivitamin with minerals tablet 1 tablet  1 tablet Oral Daily Massengill, Nathan, MD   1 tablet at 08/20/23 1024   sertraline  (ZOLOFT ) tablet 100 mg  100 mg Oral Daily Chales Pelissier, MD   100 mg at 08/20/23 1023   ticagrelor  (BRILINTA ) tablet 90 mg  90  mg Oral BID Melvenia Madelene M, NP   90 mg at 08/20/23 1023   traZODone  (DESYREL ) tablet 50 mg  50 mg Oral QHS PRN Melvenia Madelene HERO, NP   50 mg at 08/19/23 2128    Lab Results:  No results found for this or any previous visit (from the past 48 hours).   Blood Alcohol level:  Lab Results  Component Value Date   ETH <10 08/02/2023   ETH <10 05/03/2023    Metabolic Disorder Labs: Lab Results  Component Value Date   HGBA1C 5.4 02/15/2023   MPG 108.28 02/15/2023   No results found for: PROLACTIN Lab Results  Component Value Date   CHOL 125 03/14/2023   TRIG 86 03/14/2023   HDL 52 03/14/2023   CHOLHDL 2.4 03/14/2023   VLDL 17 02/15/2023   LDLCALC 56 03/14/2023   LDLCALC 164 (H) 02/15/2023     Psychiatric Specialty Exam:  Presentation  General Appearance:  Casual  Eye Contact: Fair  Speech: Slow Has some latency  Speech Volume: Decreased    Mood and Affect  Mood: Fine  Affect: Restricted   Thought Process  Thought Processes: Slow, thought blocking   Descriptions of Associations:Intact  Orientation:Full (Time, Place and Person)  Thought Content:Perseveration; Rumination  Hallucinations:Hallucinations: None  Ideas of Reference:None  Suicidal Thoughts:Suicidal Thoughts: No  Homicidal Thoughts:Homicidal Thoughts: No   Sensorium  Memory: Fair  Judgment: Limited  Insight: Limited   Executive Functions  Concentration: Poor  Attention Span: Fair  Language: Fair   Psychomotor Activity  Psychomotor Activity: Decreased  Musculoskeletal: Strength & Muscle Tone: within normal limits Gait & Station: normal Assets  Assets: Manufacturing Systems Engineer; Desire for Improvement  Physical Exam: Physical Exam Vitals and nursing note reviewed.  HENT:     Head: Normocephalic.     Nose: Nose normal.     Mouth/Throat:     Mouth: Mucous membranes are moist.  Eyes:     Pupils: Pupils are equal, round, and reactive to light.   Cardiovascular:     Rate and Rhythm: Normal rate.  Pulmonary:     Breath sounds: Normal breath sounds.  Abdominal:     General: Bowel sounds are normal.  Skin:    General: Skin is warm.  Neurological:     General: No focal deficit present.     Mental Status: She is alert.    Review of Systems  Constitutional: Negative.   HENT: Negative.    Eyes: Negative.   Respiratory: Negative.    Cardiovascular: Negative.   Gastrointestinal: Negative.   Skin: Negative.   Neurological: Negative.    Blood pressure (!) 179/90, pulse 70, temperature 98.4 F (36.9 C), resp. rate 14, height 5' 7 (1.702 m), weight 64.6 kg, SpO2 95%. Body mass index is 22.32 kg/m.  Diagnosis: Principal Problem:   MDD (major depressive disorder) Active Problems:   Hyperlipidemia   Chronic kidney disease, stage 3 (HCC)   Ischemic cardiomyopathy   Essential hypertension   Severe major depression without psychotic features (HCC)   Acute encephalopathy   PLAN: Safety and Monitoring:Patient signed voluntary 08/12/23  -- Involuntary admission to inpatient psychiatric unit for safety, stabilization and treatment  -- Daily contact with patient to assess and evaluate symptoms and progress in treatment  -- Patient's case to be discussed in multi-disciplinary team meeting  -- Observation Level : q15 minute checks  -- Vital signs:  q12 hours  -- Precautions: suicide, elopement, and assault -- Encouraged patient to participate in unit milieu and in scheduled group therapies   2. Psychiatric Diagnoses and Treatment:    Zoloft  100 mg once daily for depression  Klonopin  0.5mg  daily titrated down to 0.125 nightly with plan to discontinue after 3 doses   3. Medical Issues Being Addressed: Hospitalist recommended to stop Klonopin  and no other recommendations were provided  Abrupt change in mental status-patient viewed, psychomotor retardation not coming out of the room not answering questions appropriately.   Hospitalist consult was placed.  Stat CBC/CMP/UA/ammonia were ordered.(Pending at this time.) FINDINGS: Brain: No evidence of acute infarction, hemorrhage, hydrocephalus, extra-axial collection or mass lesion/mass effect. Extensive low-density in the cerebral white matter attributed to chronic small vessel ischemia. Chronic perforator infarct at the left basal ganglia. Chronic lacunar infarcts in the bilateral deep gray nuclei. Patchy chronic infarction in the right cerebellum as well. Mild for age cerebral volume loss.   Vascular: No hyperdense vessel or unexpected calcification.   Skull: Normal. Negative for fracture or focal lesion.   Sinuses/Orbits: No acute finding.   IMPRESSION: 1. No acute finding. 2. Extensive chronic small vessel ischemia. 4. Discharge Planning:   -- Social work and case management to assist with discharge planning and identification of hospital follow-up needs prior to discharge  -- Estimated LOS: 3-4 days  Wenceslao Harries, MD

## 2023-08-20 NOTE — Group Note (Signed)
 Recreation Therapy Group Note   Group Topic:Health and Wellness  Group Date: 08/20/2023 Start Time: 1100 End Time: 1130 Facilitators: Celestia Jeoffrey BRAVO, LRT, CTRS Location:  Dayroom  Group Description: Seated Exercise. LRT discussed the mental and physical benefits of exercise. LRT and group discussed how physical activity can be used as a coping skill. Pt's and LRT followed along to an exercise video on the TV screen that provided a visual representation and audio description of every exercise performed. Pt's encouraged to listen to their bodies and stop at any time if they experience feelings of discomfort or pain. Pts were encouraged to drink water and stay hydrated.   Goal Area(s) Addressed: Patient will learn benefits of physical activity. Patient will identify exercise as a coping skill.  Patient will follow multistep directions. Patient will try a new leisure interest.    Affect/Mood: N/A   Participation Level: Did not attend    Clinical Observations/Individualized Feedback: Patient did not attend group.   Plan: Continue to engage patient in RT group sessions 2-3x/week.   Jeoffrey BRAVO Celestia, LRT, CTRS 08/20/2023 1:04 PM

## 2023-08-21 ENCOUNTER — Inpatient Hospital Stay: Payer: Medicare Other

## 2023-08-21 DIAGNOSIS — F332 Major depressive disorder, recurrent severe without psychotic features: Secondary | ICD-10-CM | POA: Diagnosis not present

## 2023-08-21 MED ORDER — GADOBUTROL 1 MMOL/ML IV SOLN
6.0000 mL | Freq: Once | INTRAVENOUS | Status: AC | PRN
  Administered 2023-08-21: 6 mL via INTRAVENOUS

## 2023-08-21 NOTE — Plan of Care (Signed)
  Problem: Education: Goal: Ability to make informed decisions regarding treatment will improve Outcome: Progressing   Problem: Coping: Goal: Coping ability will improve Outcome: Progressing   Problem: Medication: Goal: Compliance with prescribed medication regimen will improve Outcome: Progressing   Problem: Self-Concept: Goal: Ability to disclose and discuss suicidal ideas will improve Outcome: Progressing Goal: Will verbalize positive feelings about self Outcome: Progressing

## 2023-08-21 NOTE — Group Note (Signed)
 Date:  08/21/2023 Time:  8:59 PM  Group Topic/Focus:  Self Care:   The focus of this group is to help patients understand the importance of self-care in order to improve or restore emotional, physical, spiritual, interpersonal, and financial health.    Participation Level:  Active  Participation Quality:  Appropriate  Affect:  Appropriate  Cognitive:  Appropriate  Insight: Appropriate  Engagement in Group:  Engaged  Modes of Intervention:  Discussion  Additional Comments:    Laymon ONEIDA Finder 08/21/2023, 8:59 PM

## 2023-08-21 NOTE — Progress Notes (Signed)
   08/21/23 0800  Psych Admission Type (Psych Patients Only)  Admission Status Voluntary  Psychosocial Assessment  Patient Complaints Depression  Eye Contact Fair  Facial Expression Flat  Affect Flat  Speech Soft  Interaction Assertive  Motor Activity Slow  Appearance/Hygiene Unremarkable  Behavior Characteristics Cooperative  Mood Depressed  Thought Process  Coherency WDL  Content WDL  Delusions None reported or observed  Perception WDL  Hallucination None reported or observed  Judgment Impaired  Confusion Mild  Danger to Self  Current suicidal ideation? Denies  Danger to Others  Danger to Others None reported or observed

## 2023-08-21 NOTE — Plan of Care (Signed)
  Problem: Education: Goal: Ability to make informed decisions regarding treatment will improve Outcome: Not Progressing   Problem: Coping: Goal: Coping ability will improve Outcome: Not Progressing   Problem: Health Behavior/Discharge Planning: Goal: Identification of resources available to assist in meeting health care needs will improve Outcome: Not Progressing   Problem: Medication: Goal: Compliance with prescribed medication regimen will improve Outcome: Not Progressing   Problem: Self-Concept: Goal: Ability to disclose and discuss suicidal ideas will improve Outcome: Not Progressing Goal: Will verbalize positive feelings about self Outcome: Not Progressing

## 2023-08-21 NOTE — Progress Notes (Signed)
   08/21/23 0647  15 Minute Checks  Location Bedroom  Visual Appearance Calm  Behavior Composed  Sleep (Behavioral Health Patients Only)  Calculate sleep? (Click Yes once per 24 hr at 0600 safety check) Yes  Documented sleep last 24 hours 16

## 2023-08-21 NOTE — Progress Notes (Addendum)
 [facility] MD Progress Note  08/21/2023  Karina Freeman  MRN:  [id number]  Patient is a 73 year old female presenting to [facility] ED under IVC. Per triage note Pt husband and daughter left pt at home to go run errands and found that pt right ear was bleeding when they returned home and found a wire clothes hanger on the bathroom floor.  Patient was evaluated in the emergency room and admitted to [facility] due to psych unit.  Patient was admitted to the unit in October 2020 for for depression and suicide attempt by overdose.  Subjective: Chart was reviewed and the patient was seen.  The case was discussed with nursing staff and the treatment team.  Staff reports the patient has been isolating to her room.    During the assessment today, patient was alert oriented and cooperative.  Her responses continues to be slow and delayed.  Her thought process was linear and goal-directed, continues to have on and off thought blocking.   She maintained fair eye contact. She denies any suicidal ideations.   Patient has been attending groups.  She denies auditory or visual hallucinations.  I spoke with her husband via phone today. Update provided. Husband mentioned that Dr. Donnelly had recommended MRI. MRI ordered today.  Sleep: Fair  Appetite:  Fair  Past Psychiatric History: see h&P Family History:  Family History  Problem Relation Age of Onset   Heart disease Mother    Cancer Mother        breast   Heart disease Father    Heart disease Brother    Bipolar disorder Brother    Heart disease Sister    Breast cancer Neg Hx    Social History:  Social History   Substance and Sexual Activity  Alcohol Use No   Alcohol/week: 0.0 standard drinks of alcohol     Social History   Substance and Sexual Activity  Drug Use No    Social History   Socioeconomic History   Marital status: Married    Spouse name: Marinell   Number of children: 3   Years of education: Not on file   Highest education level: Not on file   Occupational History   Not on file  Tobacco Use   Smoking status: Former    Current packs/day: 0.00    Types: Cigarettes    Quit date: 07/16/1973    Years since quitting: 50.1   Smokeless tobacco: Never  Vaping Use   Vaping status: Never Used  Substance and Sexual Activity   Alcohol use: No    Alcohol/week: 0.0 standard drinks of alcohol   Drug use: No   Sexual activity: Yes  Other Topics Concern   Not on file  Social History Narrative   Not on file   Social Drivers of Health   Financial Resource Strain: Not on file  Food Insecurity: No Food Insecurity (08/03/2023)   Hunger Vital Sign    Worried About Running Out of Food in the Last Year: Never true    Ran Out of Food in the Last Year: Never true  Transportation Needs: No Transportation Needs (08/03/2023)   PRAPARE - Administrator, Civil Service (Medical): No    Lack of Transportation (Non-Medical): No  Physical Activity: Not on file  Stress: Not on file  Social Connections: Moderately Integrated (08/03/2023)   Social Connection and Isolation Panel [NHANES]    Frequency of Communication with Friends and Family: Once a week    Frequency  of Social Gatherings with Friends and Family: Once a week    Attends Religious Services: 1 to 4 times per year    Active Member of Clubs or Organizations: Yes    Attends Banker Meetings: 1 to 4 times per year    Marital Status: Married   Past Medical History:  Past Medical History:  Diagnosis Date   Abnormal Pap smear of cervix 11/11/2015   Acute HFrEF (heart failure with reduced ejection fraction) (HCC) 02/22/2023   Acute ST elevation myocardial infarction (STEMI) of anterior wall (HCC) 02/15/2023   Anxiety    CVA (cerebral infarction)    Depressive disorder    GERD (gastroesophageal reflux disease)    Hyperlipidemia    Hypertension    IFG (impaired fasting glucose)    Osteoporosis    ST elevation myocardial infarction (STEMI) (HCC) 02/17/2023    Past  Surgical History:  Procedure Laterality Date   APPENDECTOMY     CORONARY IMAGING/OCT N/A 02/21/2023   Procedure: CORONARY IMAGING/OCT;  Surgeon: Mady Bruckner, MD;  Location: [facility] INVASIVE CV LAB;  Service: Cardiovascular;  Laterality: N/A;   CORONARY PRESSURE/FFR STUDY N/A 02/21/2023   Procedure: CORONARY PRESSURE/FFR STUDY;  Surgeon: Mady Bruckner, MD;  Location: [facility] INVASIVE CV LAB;  Service: Cardiovascular;  Laterality: N/A;   CORONARY STENT INTERVENTION N/A 02/21/2023   Procedure: CORONARY STENT INTERVENTION;  Surgeon: Mady Bruckner, MD;  Location: [facility] INVASIVE CV LAB;  Service: Cardiovascular;  Laterality: N/A;   CORONARY/GRAFT ACUTE MI REVASCULARIZATION N/A 02/15/2023   Procedure: Coronary/Graft Acute MI Revascularization;  Surgeon: Darron Deatrice LABOR, MD;  Location: [facility] INVASIVE CV LAB;  Service: Cardiovascular;  Laterality: N/A;   LEFT HEART CATH AND CORONARY ANGIOGRAPHY N/A 02/15/2023   Procedure: LEFT HEART CATH AND CORONARY ANGIOGRAPHY;  Surgeon: Darron Deatrice LABOR, MD;  Location: [facility] INVASIVE CV LAB;  Service: Cardiovascular;  Laterality: N/A;   LEFT HEART CATH AND CORONARY ANGIOGRAPHY N/A 02/21/2023   Procedure: LEFT HEART CATH AND CORONARY ANGIOGRAPHY;  Surgeon: Mady Bruckner, MD;  Location: [facility] INVASIVE CV LAB;  Service: Cardiovascular;  Laterality: N/A;   TONSILLECTOMY     TUBAL LIGATION      Current Medications: Current Facility-Administered Medications  Medication Dose Route Frequency Provider Last Rate Last Admin   acetaminophen  (TYLENOL ) tablet 650 mg  650 mg Oral Q6H PRN Melvenia Madelene HERO, NP   650 mg at 08/04/23 0148   alum & mag hydroxide-simeth (MAALOX/MYLANTA) 200-200-20 MG/5ML suspension 30 mL  30 mL Oral Q4H PRN Melvenia Madelene HERO, NP       aspirin  chewable tablet 81 mg  81 mg Oral Daily Dixon, Rashaun M, NP   81 mg at 08/21/23 1200   atorvastatin  (LIPITOR ) tablet 80 mg  80 mg Oral Daily Melvenia Madelene M, NP   80 mg at 08/21/23 1200   carvedilol  (COREG ) tablet  12.5 mg  12.5 mg Oral BID WC Dixon, Rashaun M, NP   12.5 mg at 08/21/23 1729   haloperidol  (HALDOL ) tablet 5 mg  5 mg Oral TID PRN Melvenia Madelene HERO, NP       And   diphenhydrAMINE  (BENADRYL ) capsule 50 mg  50 mg Oral TID PRN Melvenia Madelene HERO, NP       haloperidol  lactate (HALDOL ) injection 5 mg  5 mg Intramuscular TID PRN Melvenia Madelene HERO, NP       And   diphenhydrAMINE  (BENADRYL ) injection 50 mg  50 mg Intramuscular TID PRN Melvenia Madelene HERO, NP  And   LORazepam  (ATIVAN ) injection 2 mg  2 mg Intramuscular TID PRN Melvenia Madelene HERO, NP       haloperidol  lactate (HALDOL ) injection 10 mg  10 mg Intramuscular TID PRN Melvenia Madelene HERO, NP       And   diphenhydrAMINE  (BENADRYL ) injection 50 mg  50 mg Intramuscular TID PRN Melvenia Madelene HERO, NP       And   LORazepam  (ATIVAN ) injection 2 mg  2 mg Intramuscular TID PRN Melvenia, Rashaun M, NP       feeding supplement (ENSURE ENLIVE / ENSURE PLUS) liquid 237 mL  237 mL Oral BID BM Massengill, Rankin, MD   237 mL at 08/20/23 1359   hydrOXYzine  (ATARAX ) tablet 25 mg  25 mg Oral TID PRN Melvenia Madelene HERO, NP   25 mg at 08/20/23 1023   losartan  (COZAAR ) tablet 50 mg  50 mg Oral Daily Melvenia Madelene M, NP   50 mg at 08/21/23 1200   magnesium  hydroxide (MILK OF MAGNESIA) suspension 30 mL  30 mL Oral Daily PRN Melvenia Madelene HERO, NP       multivitamin with minerals tablet 1 tablet  1 tablet Oral Daily Massengill, Nathan, MD   1 tablet at 08/21/23 1200   sertraline  (ZOLOFT ) tablet 100 mg  100 mg Oral Daily Iniko Robles, MD   100 mg at 08/21/23 1200   ticagrelor  (BRILINTA ) tablet 90 mg  90 mg Oral BID Melvenia Madelene M, NP   90 mg at 08/21/23 1200   traZODone  (DESYREL ) tablet 50 mg  50 mg Oral QHS PRN Melvenia Madelene HERO, NP   50 mg at 08/19/23 2128    Lab Results:  No results found for this or any previous visit (from the past 48 hours).   Blood Alcohol level:  Lab Results  Component Value Date   ETH <10 08/02/2023   ETH <10 05/03/2023    Metabolic  Disorder Labs: Lab Results  Component Value Date   HGBA1C 5.4 02/15/2023   MPG 108.28 02/15/2023   No results found for: PROLACTIN Lab Results  Component Value Date   CHOL 125 03/14/2023   TRIG 86 03/14/2023   HDL 52 03/14/2023   CHOLHDL 2.4 03/14/2023   VLDL 17 02/15/2023   LDLCALC 56 03/14/2023   LDLCALC 164 (H) 02/15/2023     Psychiatric Specialty Exam:  Presentation  General Appearance:  Casual  Eye Contact: Fair  Speech: Slow Has some latency  Speech Volume: Decreased    Mood and Affect  Mood: Fine  Affect: Restricted   Thought Process  Thought Processes: Slow, thought blocking   Descriptions of Associations:Intact  Orientation:Full (Time, Place and Person)  Thought Content:Perseveration; Rumination  Hallucinations:Denies  Ideas of Reference:None  Suicidal Thoughts:Denies  Homicidal Thoughts:Denies   Sensorium  Memory: Fair  Judgment: Limited  Insight: Limited   Executive Functions  Concentration: Poor  Attention Span: Fair  Language: Fair   Psychomotor Activity  Psychomotor Activity: Decreased  Musculoskeletal: Strength & Muscle Tone: within normal limits Gait & Station: normal Assets  Assets: Manufacturing Systems Engineer; Desire for Improvement    Physical Exam: Physical Exam Vitals and nursing note reviewed.  HENT:     Head: Normocephalic.     Nose: Nose normal.     Mouth/Throat:     Mouth: Mucous membranes are moist.  Eyes:     Pupils: Pupils are equal, round, and reactive to light.  Cardiovascular:     Rate and Rhythm: Normal rate.  Pulmonary:  Breath sounds: Normal breath sounds.  Abdominal:     General: Bowel sounds are normal.  Skin:    General: Skin is warm.  Neurological:     General: No focal deficit present.     Mental Status: She is alert.    Review of Systems  Constitutional: Negative.   HENT: Negative.    Eyes: Negative.   Respiratory: Negative.    Cardiovascular:  Negative.   Gastrointestinal: Negative.   Skin: Negative.   Neurological: Negative.    Blood pressure (!) 157/73, pulse (!) 59, temperature 98.3 F (36.8 C), resp. rate 14, height 5' 7 (1.702 m), weight 64.6 kg, SpO2 99%. Body mass index is 22.32 kg/m.  Diagnosis: Principal Problem:   MDD (major depressive disorder) Active Problems:   Hyperlipidemia   Chronic kidney disease, stage 3 (HCC)   Ischemic cardiomyopathy   Essential hypertension   Severe major depression without psychotic features (HCC)   Acute encephalopathy   PLAN: Safety and Monitoring:Patient signed voluntary 08/12/23  -- Involuntary admission to inpatient psychiatric unit for safety, stabilization and treatment  -- Daily contact with patient to assess and evaluate symptoms and progress in treatment  -- Patient's case to be discussed in multi-disciplinary team meeting  -- Observation Level : q15 minute checks  -- Vital signs:  q12 hours  -- Precautions: suicide, elopement, and assault -- Encouraged patient to participate in unit milieu and in scheduled group therapies   2. Psychiatric Diagnoses and Treatment:    Zoloft  100 mg once daily for depression   3. Medical Issues Being Addressed: Hospitalist recommended to stop Klonopin  and no other recommendations were provided   4. Discharge Planning:   -- Social work and case management to assist with discharge planning and identification of hospital follow-up needs prior to discharge  -- Estimated LOS: 3-4 days  Wenceslao Harries, MD

## 2023-08-22 DIAGNOSIS — F332 Major depressive disorder, recurrent severe without psychotic features: Secondary | ICD-10-CM | POA: Diagnosis not present

## 2023-08-22 NOTE — Plan of Care (Signed)
  Problem: Education: Goal: Ability to make informed decisions regarding treatment will improve Outcome: Progressing   Problem: Coping: Goal: Coping ability will improve Outcome: Progressing   Problem: Medication: Goal: Compliance with prescribed medication regimen will improve Outcome: Progressing   Problem: Self-Concept: Goal: Ability to disclose and discuss suicidal ideas will improve Outcome: Progressing Goal: Will verbalize positive feelings about self Outcome: Progressing

## 2023-08-22 NOTE — Plan of Care (Signed)
   Problem: Coping: Goal: Coping ability will improve Outcome: Progressing   Problem: Medication: Goal: Compliance with prescribed medication regimen will improve Outcome: Progressing

## 2023-08-22 NOTE — Progress Notes (Signed)
 PRN Mylanta given for "stomach bubbling."  Patient denies having loose stools.

## 2023-08-22 NOTE — Progress Notes (Signed)
 Patient pleasant and cooperative.  Animated affect, smiling.  Denies SI/HI and AVH.  Denies feelings of anxiety and depression.  Denies pain. Reports fair sleep.    Compliant with scheduled medications.  15 min checks in place for safety. Patient is present in the milieu.  Appropriate interaction with peers.

## 2023-08-22 NOTE — Group Note (Signed)
 Date:  08/22/2023 Time:  8:57 PM  Group Topic/Focus:  Identifying Needs:   The focus of this group is to help patients identify their personal needs that have been historically problematic and identify healthy behaviors to address their needs.    Participation Level:  Did not attend  Participation Quality:   Did not attend  Affect:   Did not attend  Cognitive:   Did not attend  Insight: None  Engagement in Group:  None  Modes of Intervention:   Did not attend  Additional Comments:    Karina Freeman 08/22/2023, 8:57 PM

## 2023-08-22 NOTE — Progress Notes (Signed)
 Patient alert and oriented. Pt is pleasant upon approached. Visited with daughter. States visit went well.  interacting with peers and staff. Pt appears more social and engaged with good eye contact. Attends group. Denies anxiety and depression.  Denies SI, HI, AVH, and pain.  A- Scheduled medications administered to patient, per MD orders. Support and encouragement provided.  Routine safety checks conducted every 15 minutes.  Patient informed to notify staff with problems or concerns. R- No adverse drug reactions noted. Patient contracts for safety at this time. Patient compliant with medications and treatment plan. Patient remains safe at this time.

## 2023-08-22 NOTE — Group Note (Signed)
 Recreation Therapy Group Note   Group Topic:Emotion Expression  Group Date: 08/22/2023 Start Time: 1500 End Time: 1600 Facilitators: Celestia Jeoffrey BRAVO, LRT, CTRS Location:  Dayroom  Group Description: Positivity Collage. LRT and patients discussed the importance of having a positive mindset and being happy. Patients received magazines, safety scissors, a glue stick and a piece of paper. Pts were encouraged to find images or words in the magazines that showed "happiness" or positivity to them. Pt shared their collage with the group once they were finished. LRT and pts discussed how it can be difficult to always have a positive mindset, especially when they have mental health challenges.   Goal Area(s) Addressed:  Pt will identify things associate with positivity. Pt will reduce negative thinking. Pt will identify a new coping skill of thinking positive thoughts.    Affect/Mood: Appropriate   Participation Level: Active and Engaged   Participation Quality: Independent   Behavior: Appropriate, Calm, and Cooperative   Speech/Thought Process: Coherent   Insight: Good   Judgement: Good   Modes of Intervention: Art, Education, Exploration, and Guided Discussion   Patient Response to Interventions:  Attentive, Engaged, Interested , and Receptive   Education Outcome:  Acknowledges education   Clinical Observations/Individualized Feedback: Jairy was active in their participation of session activities and group discussion. Pt identified swimming, flowers, chocolate, bird baths, hammocks, cats, babies and flowers as positive things. Pt volunteered to show hers first to the group once complete. Pt appropriately identified images to reflect these goals. Pt interacted well with LRT and peers duration of session.    Plan: Continue to engage patient in RT group sessions 2-3x/week.   Jeoffrey BRAVO Celestia, LRT, CTRS 08/22/2023 5:14 PM

## 2023-08-22 NOTE — Progress Notes (Addendum)
 [facility] MD Progress Note  08/22/2023  Karina Freeman  MRN:  [id number]  Patient is a 73 year old female presenting to [facility] ED under IVC. Per triage note Pt husband and daughter left pt at home to go run errands and found that pt right ear was bleeding when they returned home and found a wire clothes hanger on the bathroom floor.  Patient was evaluated in the emergency room and admitted to [facility] due to psych unit.  Patient was admitted to the unit in October 2020 for for depression and suicide attempt by overdose.  Subjective: Chart was reviewed and the patient was seen.  The case was discussed with nursing staff and the treatment team.  Staff reports the patient has been more interactive with staff and peers today  During the assessment today, patient was alert oriented and cooperative.  Her responses are more spontaneous, she is more animated. Her thought process was linear and goal-directed.  She maintained fair eye contact. She denies any suicidal ideations.   Patient has been attending groups.  She denies auditory or visual hallucinations.  .  Sleep: Fair  Appetite:  Fair  Past Psychiatric History: see h&P Family History:  Family History  Problem Relation Age of Onset   Heart disease Mother    Cancer Mother        breast   Heart disease Father    Heart disease Brother    Bipolar disorder Brother    Heart disease Sister    Breast cancer Neg Hx    Social History:  Social History   Substance and Sexual Activity  Alcohol Use No   Alcohol/week: 0.0 standard drinks of alcohol     Social History   Substance and Sexual Activity  Drug Use No    Social History   Socioeconomic History   Marital status: Married    Spouse name: Marinell   Number of children: 3   Years of education: Not on file   Highest education level: Not on file  Occupational History   Not on file  Tobacco Use   Smoking status: Former    Current packs/day: 0.00    Types: Cigarettes    Quit date: 07/16/1973     Years since quitting: 50.1   Smokeless tobacco: Never  Vaping Use   Vaping status: Never Used  Substance and Sexual Activity   Alcohol use: No    Alcohol/week: 0.0 standard drinks of alcohol   Drug use: No   Sexual activity: Yes  Other Topics Concern   Not on file  Social History Narrative   Not on file   Social Drivers of Health   Financial Resource Strain: Not on file  Food Insecurity: No Food Insecurity (08/03/2023)   Hunger Vital Sign    Worried About Running Out of Food in the Last Year: Never true    Ran Out of Food in the Last Year: Never true  Transportation Needs: No Transportation Needs (08/03/2023)   PRAPARE - Administrator, Civil Service (Medical): No    Lack of Transportation (Non-Medical): No  Physical Activity: Not on file  Stress: Not on file  Social Connections: Moderately Integrated (08/03/2023)   Social Connection and Isolation Panel [NHANES]    Frequency of Communication with Friends and Family: Once a week    Frequency of Social Gatherings with Friends and Family: Once a week    Attends Religious Services: 1 to 4 times per year    Active Member of  Clubs or Organizations: Yes    Attends Banker Meetings: 1 to 4 times per year    Marital Status: Married   Past Medical History:  Past Medical History:  Diagnosis Date   Abnormal Pap smear of cervix 11/11/2015   Acute HFrEF (heart failure with reduced ejection fraction) (HCC) 02/22/2023   Acute ST elevation myocardial infarction (STEMI) of anterior wall (HCC) 02/15/2023   Anxiety    CVA (cerebral infarction)    Depressive disorder    GERD (gastroesophageal reflux disease)    Hyperlipidemia    Hypertension    IFG (impaired fasting glucose)    Osteoporosis    ST elevation myocardial infarction (STEMI) (HCC) 02/17/2023    Past Surgical History:  Procedure Laterality Date   APPENDECTOMY     CORONARY IMAGING/OCT N/A 02/21/2023   Procedure: CORONARY IMAGING/OCT;  Surgeon: Mady Bruckner, MD;  Location: [facility] INVASIVE CV LAB;  Service: Cardiovascular;  Laterality: N/A;   CORONARY PRESSURE/FFR STUDY N/A 02/21/2023   Procedure: CORONARY PRESSURE/FFR STUDY;  Surgeon: Mady Bruckner, MD;  Location: [facility] INVASIVE CV LAB;  Service: Cardiovascular;  Laterality: N/A;   CORONARY STENT INTERVENTION N/A 02/21/2023   Procedure: CORONARY STENT INTERVENTION;  Surgeon: Mady Bruckner, MD;  Location: [facility] INVASIVE CV LAB;  Service: Cardiovascular;  Laterality: N/A;   CORONARY/GRAFT ACUTE MI REVASCULARIZATION N/A 02/15/2023   Procedure: Coronary/Graft Acute MI Revascularization;  Surgeon: Darron Deatrice LABOR, MD;  Location: [facility] INVASIVE CV LAB;  Service: Cardiovascular;  Laterality: N/A;   LEFT HEART CATH AND CORONARY ANGIOGRAPHY N/A 02/15/2023   Procedure: LEFT HEART CATH AND CORONARY ANGIOGRAPHY;  Surgeon: Darron Deatrice LABOR, MD;  Location: [facility] INVASIVE CV LAB;  Service: Cardiovascular;  Laterality: N/A;   LEFT HEART CATH AND CORONARY ANGIOGRAPHY N/A 02/21/2023   Procedure: LEFT HEART CATH AND CORONARY ANGIOGRAPHY;  Surgeon: Mady Bruckner, MD;  Location: [facility] INVASIVE CV LAB;  Service: Cardiovascular;  Laterality: N/A;   TONSILLECTOMY     TUBAL LIGATION      Current Medications: Current Facility-Administered Medications  Medication Dose Route Frequency Provider Last Rate Last Admin   acetaminophen  (TYLENOL ) tablet 650 mg  650 mg Oral Q6H PRN Melvenia Madelene HERO, NP   650 mg at 08/04/23 0148   alum & mag hydroxide-simeth (MAALOX/MYLANTA) 200-200-20 MG/5ML suspension 30 mL  30 mL Oral Q4H PRN Melvenia Madelene HERO, NP   30 mL at 08/22/23 1820   aspirin  chewable tablet 81 mg  81 mg Oral Daily Dixon, Rashaun M, NP   81 mg at 08/22/23 0919   atorvastatin  (LIPITOR ) tablet 80 mg  80 mg Oral Daily Melvenia Madelene M, NP   80 mg at 08/22/23 0920   carvedilol  (COREG ) tablet 12.5 mg  12.5 mg Oral BID WC Dixon, Rashaun M, NP   12.5 mg at 08/22/23 1649   haloperidol  (HALDOL ) tablet 5 mg  5 mg Oral TID PRN  Melvenia Madelene HERO, NP       And   diphenhydrAMINE  (BENADRYL ) capsule 50 mg  50 mg Oral TID PRN Melvenia Madelene HERO, NP       haloperidol  lactate (HALDOL ) injection 5 mg  5 mg Intramuscular TID PRN Melvenia Madelene HERO, NP       And   diphenhydrAMINE  (BENADRYL ) injection 50 mg  50 mg Intramuscular TID PRN Melvenia Madelene HERO, NP       And   LORazepam  (ATIVAN ) injection 2 mg  2 mg Intramuscular TID PRN Melvenia Madelene HERO, NP       haloperidol   lactate (HALDOL ) injection 10 mg  10 mg Intramuscular TID PRN Melvenia Madelene HERO, NP       And   diphenhydrAMINE  (BENADRYL ) injection 50 mg  50 mg Intramuscular TID PRN Melvenia, Madelene HERO, NP       And   LORazepam  (ATIVAN ) injection 2 mg  2 mg Intramuscular TID PRN Melvenia, Rashaun M, NP       feeding supplement (ENSURE ENLIVE / ENSURE PLUS) liquid 237 mL  237 mL Oral BID BM Massengill, Rankin, MD   237 mL at 08/22/23 1028   hydrOXYzine  (ATARAX ) tablet 25 mg  25 mg Oral TID PRN Melvenia Madelene HERO, NP   25 mg at 08/20/23 1023   losartan  (COZAAR ) tablet 50 mg  50 mg Oral Daily Melvenia Madelene HERO, NP   50 mg at 08/22/23 0919   magnesium  hydroxide (MILK OF MAGNESIA) suspension 30 mL  30 mL Oral Daily PRN Melvenia Madelene HERO, NP       multivitamin with minerals tablet 1 tablet  1 tablet Oral Daily Massengill, Nathan, MD   1 tablet at 08/22/23 0919   sertraline  (ZOLOFT ) tablet 100 mg  100 mg Oral Daily Bellamia Ferch, MD   100 mg at 08/22/23 0919   ticagrelor  (BRILINTA ) tablet 90 mg  90 mg Oral BID Melvenia Madelene HERO, NP   90 mg at 08/22/23 0920   traZODone  (DESYREL ) tablet 50 mg  50 mg Oral QHS PRN Melvenia Madelene HERO, NP   50 mg at 08/19/23 2128    Lab Results:  No results found for this or any previous visit (from the past 48 hours).   Blood Alcohol level:  Lab Results  Component Value Date   ETH <10 08/02/2023   ETH <10 05/03/2023    Metabolic Disorder Labs: Lab Results  Component Value Date   HGBA1C 5.4 02/15/2023   MPG 108.28 02/15/2023   No results found for:  PROLACTIN Lab Results  Component Value Date   CHOL 125 03/14/2023   TRIG 86 03/14/2023   HDL 52 03/14/2023   CHOLHDL 2.4 03/14/2023   VLDL 17 02/15/2023   LDLCALC 56 03/14/2023   LDLCALC 164 (H) 02/15/2023     Psychiatric Specialty Exam:  Presentation  General Appearance:  Casual  Eye Contact: Fair  Speech: More spontaneous  Speech Volume: Soft    Mood and Affect  Mood: Fine  Affect: More animated   Thought Process  Thought Processes: Slow,   Descriptions of Associations:Intact  Orientation:Full (Time, Place and Person)  Thought Content:Perseveration; Rumination  Hallucinations:Denies  Ideas of Reference:None  Suicidal Thoughts:Denies  Homicidal Thoughts:Denies   Sensorium  Memory: Fair  Judgment: Limited  Insight: Limited   Executive Functions  Concentration: Poor  Attention Span: Fair  Language: Fair   Psychomotor Activity  Psychomotor Activity: Decreased  Musculoskeletal: Strength & Muscle Tone: within normal limits Gait & Station: normal Assets  Assets: Manufacturing Systems Engineer; Desire for Improvement    Physical Exam: Physical Exam Vitals and nursing note reviewed.  HENT:     Head: Normocephalic.     Nose: Nose normal.     Mouth/Throat:     Mouth: Mucous membranes are moist.  Eyes:     Pupils: Pupils are equal, round, and reactive to light.  Cardiovascular:     Rate and Rhythm: Normal rate.  Pulmonary:     Breath sounds: Normal breath sounds.  Abdominal:     General: Bowel sounds are normal.  Skin:    General: Skin is warm.  Neurological:     General: No focal deficit present.     Mental Status: She is alert.    Review of Systems  Constitutional: Negative.   HENT: Negative.    Eyes: Negative.   Respiratory: Negative.    Cardiovascular: Negative.   Gastrointestinal: Negative.   Skin: Negative.   Neurological: Negative.    Blood pressure (!) 156/74, pulse 68, temperature 97.9 F (36.6 C),  resp. rate 16, height 5' 7 (1.702 m), weight 64.6 kg, SpO2 99%. Body mass index is 22.32 kg/m.  Diagnosis: Principal Problem:   MDD (major depressive disorder) Active Problems:   Hyperlipidemia   Chronic kidney disease, stage 3 (HCC)   Ischemic cardiomyopathy   Essential hypertension   Severe major depression without psychotic features (HCC)   Acute encephalopathy   PLAN: Safety and Monitoring:Patient signed voluntary 08/12/23  -- Involuntary admission to inpatient psychiatric unit for safety, stabilization and treatment  -- Daily contact with patient to assess and evaluate symptoms and progress in treatment  -- Patient's case to be discussed in multi-disciplinary team meeting  -- Observation Level : q15 minute checks  -- Vital signs:  q12 hours  -- Precautions: suicide, elopement, and assault -- Encouraged patient to participate in unit milieu and in scheduled group therapies   2. Psychiatric Diagnoses and Treatment:    Zoloft  100 mg once daily for depression   3. Medical Issues Being Addressed: Hospitalist recommended to stop Klonopin  and no other recommendations were provided   4. Discharge Planning:   -- Social work and case management to assist with discharge planning and identification of hospital follow-up needs prior to discharge  -- Estimated LOS: 1-2 days  5. MRI results pending, staff reports the patient went for MRI   Wenceslao Harries, MD

## 2023-08-22 NOTE — Group Note (Signed)
 Recreation Therapy Group Note   Group Topic:Relaxation  Group Date: 08/22/2023 Start Time: 1100 End Time: 1135 Facilitators: Celestia Jeoffrey BRAVO, LRT, CTRS Location:  Dayroom  Group Description: Chair Yoga. LRT and patients discussed the benefits of yoga and how it differs from strength exercises. LRT educated patients on the mental and physical benefits of yoga and deep breathing and how it can be used as a associate professor. LRT and patients followed along to a guided yoga session on the television that focused on all parts of the body, as well as deep breathing. Pt encouraged to stop movement at any time if they feel discomfort or pain.   Goal Area(s) Addressed: Patient will practice using relaxation technique. Patient will identify a new coping skill.  Patient will follow multistep directions to reduce anxiety and stress.   Affect/Mood: Appropriate   Participation Level: Minimal    Clinical Observations/Individualized Feedback: Kadin came to group with 5 minutes remaninig. Pt appeared to have just gotten out of the shower.   Plan: Continue to engage patient in RT group sessions 2-3x/week.   [address], LRT, CTRS 08/22/2023 1:49 PM

## 2023-08-23 DIAGNOSIS — F332 Major depressive disorder, recurrent severe without psychotic features: Secondary | ICD-10-CM | POA: Diagnosis not present

## 2023-08-23 NOTE — Plan of Care (Signed)
   Problem: Education: Goal: Ability to make informed decisions regarding treatment will improve Outcome: Progressing   Problem: Coping: Goal: Coping ability will improve Outcome: Progressing   Problem: Health Behavior/Discharge Planning: Goal: Identification of resources available to assist in meeting health care needs will improve Outcome: Progressing   Problem: Medication: Goal: Compliance with prescribed medication regimen will improve Outcome: Progressing

## 2023-08-23 NOTE — Progress Notes (Signed)
 Occupational Therapy Group Note  Group Topic:  UE Exercise Group Date: 08/23/23 Group Time (start and end): 1300-1330 Facilitators: Warren Gill, OT; Josette Daring, PT  Group Description: Group instructed in series of upper extremities exercises, aimed to promote strength, flexibility, range of motion and functional endurance.  Patients provided cuing for proper mechanics and proper pace of exercise; exercises adjusted as necessary for individualized patient needs.  Patient also engaged in cognitive components throughout session, working to integrate attention to task, command following, and appropriate social interaction throughout session.  Allowed to ask questions as appropriate, and encouraged to identify specific exercises that they could complete independently outside of group sessions.   Therapeutic Goal(s): Demonstrate appropriate performance of upper extremity exercises to promote strength, flexibility, range of motion and functional endurance Identify 2-3 specific upper extremity exercises to complete as home exercise program outside of group session.   Individual Participation: Pt pleasant and participatory throughout. Pt made concerted effort to complete each exercise presented and receptive to instruction and modifications.    Participation Level & Quality: Pt pleasant and cooperate. Receptive to instruction and modifications.   Behavior: Appropriate, engaged.   Affect/Mood: Appropriate.   Modes of Intervention: Verbal demonstration, visual demonstration, handout provided to support recall and carryover.   Patient Response to Interventions:  Appreciative of instruction.     Plan: Continue to engage patient in PT/OT groups 1-2x/week.    Karina Freeman R., MPH, MS, OTR/L ascom [phone] 08/23/23, 1:45 PM

## 2023-08-23 NOTE — Group Note (Signed)
 Recreation Therapy Group Note   Group Topic:General Recreation  Group Date: 08/23/2023 Start Time: 1500 End Time: 1600 Facilitators: Celestia Jeoffrey BRAVO, LRT, CTRS Location: Courtyard  Group Description: Outdoor Recreation. Patients had the option to play corn hole, ring toss, bowling or listening to music while outside in the courtyard getting fresh air and sunlight. LRT and patients discussed things that they enjoy doing in their free time outside of the hospital. LRT encouraged patients to drink water after being active and getting their heart rate up.   Goal Area(s) Addressed: Patient will identify leisure interests.  Patient will practice healthy decision making. Patient will engage in recreation activity.   Affect/Mood: Flat   Participation Level: Minimal   Participation Quality: Independent   Behavior: Guarded   Speech/Thought Process: Coherent   Insight: Limited   Judgement: Fair    Modes of Intervention: Activity, Socialization, and Support   Patient Response to Interventions:  Receptive   Education Outcome:  In group clarification offered    Clinical Observations/Individualized Feedback: [place] was somewhat active in their participation of session activities and group discussion. Pt was present in group, however, pt did not interact with LRT or peers. Pt sat with her head down duration of session.    Plan: Continue to engage patient in RT group sessions 2-3x/week.   Jeoffrey BRAVO Celestia, LRT, CTRS 08/23/2023 5:29 PM

## 2023-08-23 NOTE — Progress Notes (Signed)
 [facility] MD Progress Note  08/23/2023  CHENEY GOSCH  MRN:  [id number]  Patient is a 73 year old female presenting to [facility] ED under IVC. Per triage note Pt husband and daughter left pt at home to go run errands and found that pt right ear was bleeding when they returned home and found a wire clothes hanger on the bathroom floor.  Patient was evaluated in the emergency room and admitted to [facility] due to psych unit.  Patient was admitted to the unit in October 2020 for for depression and suicide attempt by overdose.  Subjective: Chart was reviewed and the patient was seen.  The case was discussed with nursing staff and the treatment team.  Staff reports the patient has been isolating in her room, and had refused meds  During the assessment today, patient was alert oriented and avoidant. Patient's husband visited the patient yesterday, after the visit patient reportedly was tearful. Today patient said that she wants to talk to her husband about their relationship, but the pt is fearful that the husband probably won't understand and react in a negative way. Patient was provided with support. We discussed marriage counseling upon discharge. Today pt's responses were slow and delayed.  She maintained fair eye contact. She denies any suicidal ideations.   Patient has been attending groups.  She denies auditory or visual hallucinations.  .  Sleep: Fair  Appetite:  Fair  Past Psychiatric History: see h&P Family History:  Family History  Problem Relation Age of Onset   Heart disease Mother    Cancer Mother        breast   Heart disease Father    Heart disease Brother    Bipolar disorder Brother    Heart disease Sister    Breast cancer Neg Hx    Social History:  Social History   Substance and Sexual Activity  Alcohol Use No   Alcohol/week: 0.0 standard drinks of alcohol     Social History   Substance and Sexual Activity  Drug Use No    Social History   Socioeconomic History   Marital  status: Married    Spouse name: Marinell   Number of children: 3   Years of education: Not on file   Highest education level: Not on file  Occupational History   Not on file  Tobacco Use   Smoking status: Former    Current packs/day: 0.00    Types: Cigarettes    Quit date: 07/16/1973    Years since quitting: 50.1   Smokeless tobacco: Never  Vaping Use   Vaping status: Never Used  Substance and Sexual Activity   Alcohol use: No    Alcohol/week: 0.0 standard drinks of alcohol   Drug use: No   Sexual activity: Yes  Other Topics Concern   Not on file  Social History Narrative   Not on file   Social Drivers of Health   Financial Resource Strain: Not on file  Food Insecurity: No Food Insecurity (08/03/2023)   Hunger Vital Sign    Worried About Running Out of Food in the Last Year: Never true    Ran Out of Food in the Last Year: Never true  Transportation Needs: No Transportation Needs (08/03/2023)   PRAPARE - Administrator, Civil Service (Medical): No    Lack of Transportation (Non-Medical): No  Physical Activity: Not on file  Stress: Not on file  Social Connections: Moderately Integrated (08/03/2023)   Social Connection and Isolation Panel [  NHANES]    Frequency of Communication with Friends and Family: Once a week    Frequency of Social Gatherings with Friends and Family: Once a week    Attends Religious Services: 1 to 4 times per year    Active Member of Clubs or Organizations: Yes    Attends Banker Meetings: 1 to 4 times per year    Marital Status: Married   Past Medical History:  Past Medical History:  Diagnosis Date   Abnormal Pap smear of cervix 11/11/2015   Acute HFrEF (heart failure with reduced ejection fraction) (HCC) 02/22/2023   Acute ST elevation myocardial infarction (STEMI) of anterior wall (HCC) 02/15/2023   Anxiety    CVA (cerebral infarction)    Depressive disorder    GERD (gastroesophageal reflux disease)    Hyperlipidemia     Hypertension    IFG (impaired fasting glucose)    Osteoporosis    ST elevation myocardial infarction (STEMI) (HCC) 02/17/2023    Past Surgical History:  Procedure Laterality Date   APPENDECTOMY     CORONARY IMAGING/OCT N/A 02/21/2023   Procedure: CORONARY IMAGING/OCT;  Surgeon: Mady Bruckner, MD;  Location: [facility] INVASIVE CV LAB;  Service: Cardiovascular;  Laterality: N/A;   CORONARY PRESSURE/FFR STUDY N/A 02/21/2023   Procedure: CORONARY PRESSURE/FFR STUDY;  Surgeon: Mady Bruckner, MD;  Location: [facility] INVASIVE CV LAB;  Service: Cardiovascular;  Laterality: N/A;   CORONARY STENT INTERVENTION N/A 02/21/2023   Procedure: CORONARY STENT INTERVENTION;  Surgeon: Mady Bruckner, MD;  Location: [facility] INVASIVE CV LAB;  Service: Cardiovascular;  Laterality: N/A;   CORONARY/GRAFT ACUTE MI REVASCULARIZATION N/A 02/15/2023   Procedure: Coronary/Graft Acute MI Revascularization;  Surgeon: Darron Deatrice LABOR, MD;  Location: [facility] INVASIVE CV LAB;  Service: Cardiovascular;  Laterality: N/A;   LEFT HEART CATH AND CORONARY ANGIOGRAPHY N/A 02/15/2023   Procedure: LEFT HEART CATH AND CORONARY ANGIOGRAPHY;  Surgeon: Darron Deatrice LABOR, MD;  Location: [facility] INVASIVE CV LAB;  Service: Cardiovascular;  Laterality: N/A;   LEFT HEART CATH AND CORONARY ANGIOGRAPHY N/A 02/21/2023   Procedure: LEFT HEART CATH AND CORONARY ANGIOGRAPHY;  Surgeon: Mady Bruckner, MD;  Location: [facility] INVASIVE CV LAB;  Service: Cardiovascular;  Laterality: N/A;   TONSILLECTOMY     TUBAL LIGATION      Current Medications: Current Facility-Administered Medications  Medication Dose Route Frequency Provider Last Rate Last Admin   acetaminophen  (TYLENOL ) tablet 650 mg  650 mg Oral Q6H PRN Melvenia Madelene HERO, NP   650 mg at 08/04/23 0148   alum & mag hydroxide-simeth (MAALOX/MYLANTA) 200-200-20 MG/5ML suspension 30 mL  30 mL Oral Q4H PRN Melvenia Madelene HERO, NP   30 mL at 08/22/23 1820   aspirin  chewable tablet 81 mg  81 mg Oral Daily Dixon, Rashaun M, NP    81 mg at 08/23/23 0930   atorvastatin  (LIPITOR ) tablet 80 mg  80 mg Oral Daily Melvenia Madelene M, NP   80 mg at 08/23/23 0930   carvedilol  (COREG ) tablet 12.5 mg  12.5 mg Oral BID WC Dixon, Rashaun M, NP   12.5 mg at 08/23/23 1715   haloperidol  (HALDOL ) tablet 5 mg  5 mg Oral TID PRN Melvenia Madelene HERO, NP       And   diphenhydrAMINE  (BENADRYL ) capsule 50 mg  50 mg Oral TID PRN Melvenia Madelene HERO, NP       haloperidol  lactate (HALDOL ) injection 5 mg  5 mg Intramuscular TID PRN Melvenia Madelene HERO, NP       And  diphenhydrAMINE  (BENADRYL ) injection 50 mg  50 mg Intramuscular TID PRN Melvenia Madelene HERO, NP       And   LORazepam  (ATIVAN ) injection 2 mg  2 mg Intramuscular TID PRN Melvenia Madelene HERO, NP       haloperidol  lactate (HALDOL ) injection 10 mg  10 mg Intramuscular TID PRN Melvenia Madelene HERO, NP       And   diphenhydrAMINE  (BENADRYL ) injection 50 mg  50 mg Intramuscular TID PRN Melvenia Madelene HERO, NP       And   LORazepam  (ATIVAN ) injection 2 mg  2 mg Intramuscular TID PRN Melvenia, Rashaun M, NP       feeding supplement (ENSURE ENLIVE / ENSURE PLUS) liquid 237 mL  237 mL Oral BID BM Massengill, Rankin, MD   237 mL at 08/22/23 1028   hydrOXYzine  (ATARAX ) tablet 25 mg  25 mg Oral TID PRN Melvenia Madelene HERO, NP   25 mg at 08/20/23 1023   losartan  (COZAAR ) tablet 50 mg  50 mg Oral Daily Dixon, Rashaun M, NP   50 mg at 08/23/23 0930   magnesium  hydroxide (MILK OF MAGNESIA) suspension 30 mL  30 mL Oral Daily PRN Melvenia Madelene HERO, NP       multivitamin with minerals tablet 1 tablet  1 tablet Oral Daily Massengill, Nathan, MD   1 tablet at 08/23/23 0930   sertraline  (ZOLOFT ) tablet 100 mg  100 mg Oral Daily Candie Gintz, MD   100 mg at 08/23/23 0930   ticagrelor  (BRILINTA ) tablet 90 mg  90 mg Oral BID Melvenia Madelene M, NP   90 mg at 08/23/23 0930   traZODone  (DESYREL ) tablet 50 mg  50 mg Oral QHS PRN Melvenia Madelene HERO, NP   50 mg at 08/22/23 2025    Lab Results:  No results found for this or any previous  visit (from the past 48 hours).   Blood Alcohol level:  Lab Results  Component Value Date   ETH <10 08/02/2023   ETH <10 05/03/2023    Metabolic Disorder Labs: Lab Results  Component Value Date   HGBA1C 5.4 02/15/2023   MPG 108.28 02/15/2023   No results found for: PROLACTIN Lab Results  Component Value Date   CHOL 125 03/14/2023   TRIG 86 03/14/2023   HDL 52 03/14/2023   CHOLHDL 2.4 03/14/2023   VLDL 17 02/15/2023   LDLCALC 56 03/14/2023   LDLCALC 164 (H) 02/15/2023     Psychiatric Specialty Exam:  Presentation  General Appearance:  Casual  Eye Contact: Fair  Speech: Slow Has some latency  Speech Volume: Decreased    Mood and Affect  Mood: Fine  Affect: Restricted   Thought Process  Thought Processes: Slow, thought blocking   Descriptions of Associations:Intact  Orientation:Full (Time, Place and Person)  Thought Content:Perseveration; Rumination  Hallucinations:Denies  Ideas of Reference:None  Suicidal Thoughts:Denies  Homicidal Thoughts:Denies   Sensorium  Memory: Fair  Judgment: Limited  Insight: Limited   Executive Functions  Concentration: Poor  Attention Span: Fair  Language: Fair   Psychomotor Activity  Psychomotor Activity: Decreased  Musculoskeletal: Strength & Muscle Tone: within normal limits Gait & Station: normal Assets  Assets: Manufacturing Systems Engineer; Desire for Improvement    Physical Exam: Physical Exam Vitals and nursing note reviewed.  HENT:     Head: Normocephalic.     Nose: Nose normal.     Mouth/Throat:     Mouth: Mucous membranes are moist.  Eyes:     Pupils: Pupils are  equal, round, and reactive to light.  Cardiovascular:     Rate and Rhythm: Normal rate.  Pulmonary:     Breath sounds: Normal breath sounds.  Abdominal:     General: Bowel sounds are normal.  Skin:    General: Skin is warm.  Neurological:     General: No focal deficit present.     Mental Status: She  is alert.    Review of Systems  Constitutional: Negative.   HENT: Negative.    Eyes: Negative.   Respiratory: Negative.    Cardiovascular: Negative.   Gastrointestinal: Negative.   Skin: Negative.   Neurological: Negative.    Blood pressure 118/67, pulse 64, temperature 98.8 F (37.1 C), resp. rate 15, height 5' 7 (1.702 m), weight 64.6 kg, SpO2 97%. Body mass index is 22.32 kg/m.  Diagnosis: Principal Problem:   MDD (major depressive disorder) Active Problems:   Hyperlipidemia   Chronic kidney disease, stage 3 (HCC)   Ischemic cardiomyopathy   Essential hypertension   Severe major depression without psychotic features (HCC)   Acute encephalopathy   PLAN: Safety and Monitoring:Patient signed voluntary 08/12/23  -- Involuntary admission to inpatient psychiatric unit for safety, stabilization and treatment  -- Daily contact with patient to assess and evaluate symptoms and progress in treatment  -- Patient's case to be discussed in multi-disciplinary team meeting  -- Observation Level : q15 minute checks  -- Vital signs:  q12 hours  -- Precautions: suicide, elopement, and assault -- Encouraged patient to participate in unit milieu and in scheduled group therapies   2. Psychiatric Diagnoses and Treatment:    Zoloft  100 mg once daily for depression   3. Medical Issues Being Addressed: Hospitalist recommended to stop Klonopin  and no other recommendations were provided   4. Discharge Planning:   -- Social work and case management to assist with discharge planning and identification of hospital follow-up needs prior to discharge  -- Discharge date to be determined   Wenceslao Harries, MD

## 2023-08-23 NOTE — Plan of Care (Signed)
°  Problem: Medication: Goal: Compliance with prescribed medication regimen will improve Outcome: Progressing   Problem: Coping: Goal: Coping ability will improve Outcome: Not Progressing   Problem: Self-Concept: Goal: Will verbalize positive feelings about self Outcome: Not Progressing
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
## 2023-08-23 NOTE — [facility] Counselor (Signed)
 CSW spoke with pt regarding her discharge per provider's request due to concerns about the pt's relationship with her husband,   According to pt she would like to go home with her husband.   CSW will inform provider.   Lum Croft, MSW, [place] 08/23/2023 3:11 PM

## 2023-08-23 NOTE — Group Note (Signed)
 Recreation Therapy Group Note   Group Topic:Relaxation  Group Date: 08/23/2023 Start Time: 1100 End Time: 1135 Facilitators: Celestia Jeoffrey BRAVO, LRT, CTRS Location:  Dayroom  Group Description: Meditation. LRT and patients discussed what they know about meditation and mindfulness. LRT played a Deep Breathing Meditation exercise script for patients to follow along to. LRT and patients discussed how meditation and deep breathing can be used as a coping skill post--discharge to help manage symptoms of stress.   Goal Area(s) Addressed: Patient will practice using relaxation technique. Patient will identify a new coping skill.  Patient will follow multistep directions to reduce anxiety and stress.   Affect/Mood: N/A   Participation Level: Did not attend    Clinical Observations/Individualized Feedback: Patient did not attend group.   Plan: Continue to engage patient in RT group sessions 2-3x/week.   Jeoffrey BRAVO Celestia, LRT, CTRS 08/23/2023 1:38 PM

## 2023-08-23 NOTE — Progress Notes (Signed)
 Patient has sad, depressed affect.  Endorses anxiety and depression.  Denies SI/HI and AVH.  Minimal responses to assessment questions. Eyes closed. Denies pain.  Poor sleep last night.   Compliant with scheduled medications on 2nd attempt with Dr. Victoria.  15 min checks in place for safety.  Patient has been present in the milieu this afternoon.  Appropriate, but minimal interaction with peers.   Patient has initiated interaction with staff.

## 2023-08-24 DIAGNOSIS — F332 Major depressive disorder, recurrent severe without psychotic features: Secondary | ICD-10-CM | POA: Diagnosis not present

## 2023-08-24 MED ORDER — ARIPIPRAZOLE 2 MG PO TABS
2.0000 mg | ORAL_TABLET | Freq: Every day | ORAL | Status: DC
Start: 2023-08-24 — End: 2023-08-28
  Administered 2023-08-24 – 2023-08-28 (×5): 2 mg via ORAL
  Filled 2023-08-24 (×6): qty 1

## 2023-08-24 NOTE — Progress Notes (Signed)
   08/24/23 0600  15 Minute Checks  Location Bedroom  Visual Appearance Calm  Behavior Composed  Sleep (Behavioral Health Patients Only)  Calculate sleep? (Click Yes once per 24 hr at 0600 safety check) Yes  Documented sleep last 24 hours 7.75

## 2023-08-24 NOTE — Progress Notes (Signed)
 [facility] MD Progress Note  08/24/2023  Karina Freeman  MRN:  [id number]  Patient is a 73 year old female presenting to [facility] ED under IVC. Per triage note Pt husband and daughter left pt at home to go run errands and found that pt right ear was bleeding when they returned home and found a wire clothes hanger on the bathroom floor.  Patient was evaluated in the emergency room and admitted to [facility] due to psych unit.  Patient was admitted to the unit in October 2020 for for depression and suicide attempt by overdose.  Subjective: Chart was reviewed and the patient was seen.  The case was discussed with nursing staff and the treatment team.  Staff reports the patient has been isolating in her room, patient did not come out for breakfast.  During the assessment today, patient was alert oriented and avoidant.  Patient reports fine mood.  Patient continues to report that she wants to talk to her husband about their relationship, but the pt is fearful that the husband probably won't understand and react in a negative way. Patient was provided with support. We once again discussed marriage counseling upon discharge. Today pt's responses were slow and delayed.  She denies any suicidal ideations.   Patient has been attending groups.  She denies auditory or visual hallucinations.  . Sleep: Fair  Appetite:  Fair  Past Psychiatric History: see h&P Family History:  Family History  Problem Relation Age of Onset   Heart disease Mother    Cancer Mother        breast   Heart disease Father    Heart disease Brother    Bipolar disorder Brother    Heart disease Sister    Breast cancer Neg Hx    Social History:  Social History   Substance and Sexual Activity  Alcohol Use No   Alcohol/week: 0.0 standard drinks of alcohol     Social History   Substance and Sexual Activity  Drug Use No    Social History   Socioeconomic History   Marital status: Married    Spouse name: Marinell   Number of children: 3    Years of education: Not on file   Highest education level: Not on file  Occupational History   Not on file  Tobacco Use   Smoking status: Former    Current packs/day: 0.00    Types: Cigarettes    Quit date: 07/16/1973    Years since quitting: 50.1   Smokeless tobacco: Never  Vaping Use   Vaping status: Never Used  Substance and Sexual Activity   Alcohol use: No    Alcohol/week: 0.0 standard drinks of alcohol   Drug use: No   Sexual activity: Yes  Other Topics Concern   Not on file  Social History Narrative   Not on file   Social Drivers of Health   Financial Resource Strain: Not on file  Food Insecurity: No Food Insecurity (08/03/2023)   Hunger Vital Sign    Worried About Running Out of Food in the Last Year: Never true    Ran Out of Food in the Last Year: Never true  Transportation Needs: No Transportation Needs (08/03/2023)   PRAPARE - Administrator, Civil Service (Medical): No    Lack of Transportation (Non-Medical): No  Physical Activity: Not on file  Stress: Not on file  Social Connections: Moderately Integrated (08/03/2023)   Social Connection and Isolation Panel [NHANES]    Frequency of Communication  with Friends and Family: Once a week    Frequency of Social Gatherings with Friends and Family: Once a week    Attends Religious Services: 1 to 4 times per year    Active Member of Clubs or Organizations: Yes    Attends Banker Meetings: 1 to 4 times per year    Marital Status: Married   Past Medical History:  Past Medical History:  Diagnosis Date   Abnormal Pap smear of cervix 11/11/2015   Acute HFrEF (heart failure with reduced ejection fraction) (HCC) 02/22/2023   Acute ST elevation myocardial infarction (STEMI) of anterior wall (HCC) 02/15/2023   Anxiety    CVA (cerebral infarction)    Depressive disorder    GERD (gastroesophageal reflux disease)    Hyperlipidemia    Hypertension    IFG (impaired fasting glucose)    Osteoporosis     ST elevation myocardial infarction (STEMI) (HCC) 02/17/2023    Past Surgical History:  Procedure Laterality Date   APPENDECTOMY     CORONARY IMAGING/OCT N/A 02/21/2023   Procedure: CORONARY IMAGING/OCT;  Surgeon: Mady Bruckner, MD;  Location: [facility] INVASIVE CV LAB;  Service: Cardiovascular;  Laterality: N/A;   CORONARY PRESSURE/FFR STUDY N/A 02/21/2023   Procedure: CORONARY PRESSURE/FFR STUDY;  Surgeon: Mady Bruckner, MD;  Location: [facility] INVASIVE CV LAB;  Service: Cardiovascular;  Laterality: N/A;   CORONARY STENT INTERVENTION N/A 02/21/2023   Procedure: CORONARY STENT INTERVENTION;  Surgeon: Mady Bruckner, MD;  Location: [facility] INVASIVE CV LAB;  Service: Cardiovascular;  Laterality: N/A;   CORONARY/GRAFT ACUTE MI REVASCULARIZATION N/A 02/15/2023   Procedure: Coronary/Graft Acute MI Revascularization;  Surgeon: Darron Deatrice LABOR, MD;  Location: [facility] INVASIVE CV LAB;  Service: Cardiovascular;  Laterality: N/A;   LEFT HEART CATH AND CORONARY ANGIOGRAPHY N/A 02/15/2023   Procedure: LEFT HEART CATH AND CORONARY ANGIOGRAPHY;  Surgeon: Darron Deatrice LABOR, MD;  Location: [facility] INVASIVE CV LAB;  Service: Cardiovascular;  Laterality: N/A;   LEFT HEART CATH AND CORONARY ANGIOGRAPHY N/A 02/21/2023   Procedure: LEFT HEART CATH AND CORONARY ANGIOGRAPHY;  Surgeon: Mady Bruckner, MD;  Location: [facility] INVASIVE CV LAB;  Service: Cardiovascular;  Laterality: N/A;   TONSILLECTOMY     TUBAL LIGATION      Current Medications: Current Facility-Administered Medications  Medication Dose Route Frequency Provider Last Rate Last Admin   acetaminophen  (TYLENOL ) tablet 650 mg  650 mg Oral Q6H PRN Melvenia Madelene HERO, NP   650 mg at 08/04/23 0148   alum & mag hydroxide-simeth (MAALOX/MYLANTA) 200-200-20 MG/5ML suspension 30 mL  30 mL Oral Q4H PRN Melvenia Madelene HERO, NP   30 mL at 08/22/23 1820   aspirin  chewable tablet 81 mg  81 mg Oral Daily Dixon, Rashaun M, NP   81 mg at 08/24/23 1030   atorvastatin  (LIPITOR ) tablet 80 mg  80  mg Oral Daily Melvenia Madelene M, NP   80 mg at 08/24/23 1030   carvedilol  (COREG ) tablet 12.5 mg  12.5 mg Oral BID WC Dixon, Rashaun M, NP   12.5 mg at 08/24/23 1030   haloperidol  (HALDOL ) tablet 5 mg  5 mg Oral TID PRN Melvenia Madelene HERO, NP       And   diphenhydrAMINE  (BENADRYL ) capsule 50 mg  50 mg Oral TID PRN Melvenia Madelene HERO, NP       haloperidol  lactate (HALDOL ) injection 5 mg  5 mg Intramuscular TID PRN Melvenia Madelene HERO, NP       And   diphenhydrAMINE  (BENADRYL ) injection 50 mg  50  mg Intramuscular TID PRN Melvenia Madelene HERO, NP       And   LORazepam  (ATIVAN ) injection 2 mg  2 mg Intramuscular TID PRN Melvenia Madelene HERO, NP       haloperidol  lactate (HALDOL ) injection 10 mg  10 mg Intramuscular TID PRN Melvenia Madelene HERO, NP       And   diphenhydrAMINE  (BENADRYL ) injection 50 mg  50 mg Intramuscular TID PRN Melvenia Madelene HERO, NP       And   LORazepam  (ATIVAN ) injection 2 mg  2 mg Intramuscular TID PRN Melvenia, Rashaun M, NP       feeding supplement (ENSURE ENLIVE / ENSURE PLUS) liquid 237 mL  237 mL Oral BID BM Massengill, Rankin, MD   237 mL at 08/22/23 1028   hydrOXYzine  (ATARAX ) tablet 25 mg  25 mg Oral TID PRN Melvenia Madelene HERO, NP   25 mg at 08/20/23 1023   losartan  (COZAAR ) tablet 50 mg  50 mg Oral Daily Melvenia Madelene M, NP   50 mg at 08/24/23 1030   magnesium  hydroxide (MILK OF MAGNESIA) suspension 30 mL  30 mL Oral Daily PRN Melvenia Madelene HERO, NP       multivitamin with minerals tablet 1 tablet  1 tablet Oral Daily Massengill, Nathan, MD   1 tablet at 08/24/23 1029   sertraline  (ZOLOFT ) tablet 100 mg  100 mg Oral Daily Ziah Leandro, MD   100 mg at 08/24/23 1030   ticagrelor  (BRILINTA ) tablet 90 mg  90 mg Oral BID Melvenia Madelene M, NP   90 mg at 08/24/23 1029   traZODone  (DESYREL ) tablet 50 mg  50 mg Oral QHS PRN Melvenia Madelene HERO, NP   50 mg at 08/23/23 2115    Lab Results:  No results found for this or any previous visit (from the past 48 hours).   Blood Alcohol level:  Lab  Results  Component Value Date   ETH <10 08/02/2023   ETH <10 05/03/2023    Metabolic Disorder Labs: Lab Results  Component Value Date   HGBA1C 5.4 02/15/2023   MPG 108.28 02/15/2023   No results found for: PROLACTIN Lab Results  Component Value Date   CHOL 125 03/14/2023   TRIG 86 03/14/2023   HDL 52 03/14/2023   CHOLHDL 2.4 03/14/2023   VLDL 17 02/15/2023   LDLCALC 56 03/14/2023   LDLCALC 164 (H) 02/15/2023     Psychiatric Specialty Exam:  Presentation  General Appearance:  Casual  Eye Contact: Fair  Speech: Slow Has some latency  Speech Volume: Decreased    Mood and Affect  Mood: Fine  Affect: Restricted   Thought Process  Thought Processes: Slow, thought blocking   Descriptions of Associations:Intact  Orientation:Full (Time, Place and Person)  Thought Content:Perseveration; Rumination  Hallucinations:Denies  Ideas of Reference:None  Suicidal Thoughts:Denies  Homicidal Thoughts:Denies   Sensorium  Memory: Fair  Judgment: Limited  Insight: Limited   Executive Functions  Concentration: Poor  Attention Span: Fair  Language: Fair   Psychomotor Activity  Psychomotor Activity: Decreased  Musculoskeletal: Strength & Muscle Tone: within normal limits Gait & Station: normal Assets  Assets: Manufacturing Systems Engineer; Desire for Improvement    Physical Exam: Physical Exam Vitals and nursing note reviewed.  HENT:     Head: Normocephalic.     Nose: Nose normal.     Mouth/Throat:     Mouth: Mucous membranes are moist.  Eyes:     Pupils: Pupils are equal, round, and reactive to light.  Cardiovascular:     Rate and Rhythm: Normal rate.  Pulmonary:     Breath sounds: Normal breath sounds.  Abdominal:     General: Bowel sounds are normal.  Skin:    General: Skin is warm.  Neurological:     General: No focal deficit present.     Mental Status: She is alert.    Review of Systems  Constitutional: Negative.    HENT: Negative.    Eyes: Negative.   Respiratory: Negative.    Cardiovascular: Negative.   Gastrointestinal: Negative.   Skin: Negative.   Neurological: Negative.    Blood pressure (!) 157/81, pulse 72, temperature 97.7 F (36.5 C), resp. rate 16, height 5' 7 (1.702 m), weight 64.6 kg, SpO2 98%. Body mass index is 22.32 kg/m.  Diagnosis: Principal Problem:   MDD (major depressive disorder) Active Problems:   Hyperlipidemia   Chronic kidney disease, stage 3 (HCC)   Ischemic cardiomyopathy   Essential hypertension   Severe major depression without psychotic features (HCC)   Acute encephalopathy   PLAN: Safety and Monitoring:Patient signed voluntary 08/12/23  -- Involuntary admission to inpatient psychiatric unit for safety, stabilization and treatment  -- Daily contact with patient to assess and evaluate symptoms and progress in treatment  -- Patient's case to be discussed in multi-disciplinary team meeting  -- Observation Level : q15 minute checks  -- Vital signs:  q12 hours  -- Precautions: suicide, elopement, and assault -- Encouraged patient to participate in unit milieu and in scheduled group therapies   2. Psychiatric Diagnoses and Treatment:    Zoloft  100 mg once daily for depression               Added  2 mg of Abilify  to augment the effect of Zoloft , 08/24/23   3. 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 Chronic white matter disease and remote callosal, bilateral basal ganglia, thalami and right cerebellar infarcts.   IMPRESSION: 1. No evidence of dural venous sinus thrombosis. 2. 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  4. Discharge Planning:   -- Social work and case management to assist with discharge planning and identification of hospital follow-up needs prior to  discharge  -- Discharge date to be determined   Wenceslao Harries, MD

## 2023-08-24 NOTE — Plan of Care (Signed)
 Patient alert and oriented. Denies SI, HI, AVH and pain. Compliant w/scheduled medications per MAR. Support and encouragement provided.  Routine safety checks conducted every 15 minutes.  Patient informed to notify staff with problems or concerns. No adverse drug reactions noted. Patient verbally contracts for safety at this time. Patient interacts well with others on the unit.  Patient remains safe at this time.  Problem: Education: Goal: Ability to make informed decisions regarding treatment will improve Outcome: Progressing   Problem: Coping: Goal: Coping ability will improve Outcome: Progressing   Problem: Health Behavior/Discharge Planning: Goal: Identification of resources available to assist in meeting health care needs will improve Outcome: Progressing   Problem: Medication: Goal: Compliance with prescribed medication regimen will improve Outcome: Progressing   Problem: Self-Concept: Goal: Ability to disclose and discuss suicidal ideas will improve Outcome: Progressing Goal: Will verbalize positive feelings about self Outcome: Progressing

## 2023-08-24 NOTE — Group Note (Signed)
 Date:  08/24/2023 Time:  9:29 PM  Group Topic/Focus:  Healthy Communication:   The focus of this group is to discuss communication, barriers to communication, as well as healthy ways to communicate with others.    Participation Level:  Active  Participation Quality:  Appropriate  Affect:  Appropriate  Cognitive:  Appropriate  Insight: Appropriate  Engagement in Group:  Engaged  Modes of Intervention:  Education  Additional Comments:    Karina Freeman 08/24/2023, 9:29 PM

## 2023-08-24 NOTE — Plan of Care (Signed)
  Problem: Education: Goal: Ability to make informed decisions regarding treatment will improve Outcome: Not Progressing   Problem: Coping: Goal: Coping ability will improve Outcome: Not Progressing   Problem: Health Behavior/Discharge Planning: Goal: Identification of resources available to assist in meeting health care needs will improve Outcome: Progressing   Problem: Medication: Goal: Compliance with prescribed medication regimen will improve Outcome: Progressing   Problem: Self-Concept: Goal: Ability to disclose and discuss suicidal ideas will improve Outcome: Completed/Met Goal: Will verbalize positive feelings about self Outcome: Progressing

## 2023-08-24 NOTE — Progress Notes (Signed)
 Assumed care of patient this am, she present A&O x 2, affect & mood sad, depressed and reports thinking about my home. Pt out in the milieu, isolative to self, watches TV at times and had  minimum interactions with others otherwise, behavior appropriate  and was without complaints. Pt denied thoughts, plan or intent to harm self or others and made a safety commitment.  Pt is compliant with taking medications and and following plan of care. Pt educated on plan of care, medication regimen and she acknowledged an understanding and was without further complaints or concerns and is managed on q 15 min rounds.

## 2023-08-24 NOTE — Progress Notes (Signed)
   08/23/23 2115  Psych Admission Type (Psych Patients Only)  Admission Status Voluntary  Psychosocial Assessment  Patient Complaints Anxiety;Depression  Eye Contact Brief  Facial Expression Flat;Sad  Affect Depressed;Sad  Speech Soft  Interaction Minimal  Motor Activity Slow  Appearance/Hygiene Unremarkable  Behavior Characteristics Cooperative  Mood Sad  Aggressive Behavior  Effect No apparent injury  Thought Process  Coherency WDL  Content WDL  Delusions None reported or observed  Perception WDL  Hallucination None reported or observed  Judgment Impaired  Confusion Mild  Danger to Self  Current suicidal ideation? Denies  Agreement Not to Harm Self Yes  Description of Agreement Verbal  Danger to Others  Danger to Others None reported or observed

## 2023-08-25 DIAGNOSIS — F332 Major depressive disorder, recurrent severe without psychotic features: Secondary | ICD-10-CM | POA: Diagnosis not present

## 2023-08-25 NOTE — Plan of Care (Signed)
  Problem: Medication: Goal: Compliance with prescribed medication regimen will improve Outcome: Progressing   Problem: Education: Goal: Ability to make informed decisions regarding treatment will improve Outcome: Not Progressing   Problem: Coping: Goal: Coping ability will improve Outcome: Not Progressing   Problem: Health Behavior/Discharge Planning: Goal: Identification of resources available to assist in meeting health care needs will improve Outcome: Not Progressing   Problem: Self-Concept: Goal: Will verbalize positive feelings about self Outcome: Not Progressing

## 2023-08-25 NOTE — [facility] Group Notes (Signed)
 LCSW Wellness Group Note   08/25/2023 1:30pm  Type of Group and Topic: Psychoeducational Group:  Wellness  Participation Level:  moderate  Description of Group  Wellness group introduces the topic and its focus on developing healthy habits across the spectrum and its relationship to a decrease in hospital admissions.  Six areas of wellness are discussed: physical, social spiritual, intellectual, occupational, and emotional.  Patients are asked to consider their current wellness habits and to identify areas of wellness where they are interested and able to focus on improvements.    Therapeutic Goals Patients will understand components of wellness and how they can positively impact overall health.  Patients will identify areas of wellness where they have developed good habits. Patients will identify areas of wellness where they would like to make improvements.    Summary of Patient Progress: pt attentive in group, did not speak up much during discussion but did respond to CSW questions when called upon.  Pt identified spiritual and social as wellness areas of strength.  Pt identified intellectual as wellness area that needs improvement.       Therapeutic Modalities: Cognitive Behavioral Therapy Psychoeducation    Bridget Cordella Simmonds, LCSW

## 2023-08-25 NOTE — Progress Notes (Signed)
 [facility] MD Progress Note  08/25/2023  Karina Freeman  MRN:  [id number]  Patient is a 73 year old female presenting to [facility] ED under IVC. Per triage note Pt husband and daughter left pt at home to go run errands and found that pt right ear was bleeding when they returned home and found a wire clothes hanger on the bathroom floor.  Patient was evaluated in the emergency room and admitted to [facility] due to psych unit.  Patient was admitted to the unit in October 2020 for for depression and suicide attempt by overdose.  Subjective: Chart was reviewed and the patient was seen.  The case was discussed with nursing staff and the treatment team.  Staff reports the patient is more alert and visible in the area During the assessment today, patient was alert oriented and more animated.  Patient reports good mood.  Patient reports that her husband visited yesterday.  According to patient the visit went well.  Patient reports that she was able to voice some of her concerns to her husband yesterday.  Patient was provided with support. We once again discussed marriage counseling upon discharge. Today pt's responses are more spontaneous.  She denies any suicidal ideations.   Patient has been attending groups.  She denies auditory or visual hallucinations.  I spoke with patient's husband via phone today.  Husband was provided with updates.  We discussed couples therapy or marriage counseling upon discharge.  We discussed patient discharge plan.  Husband was informed that if patient continues to show improvement, anticipated discharge probably around Wednesday.  Husband's questions and concerns were addressed to his satisfaction.  . Sleep: Fair  Appetite:  Fair  Past Psychiatric History: see h&P Family History:  Family History  Problem Relation Age of Onset   Heart disease Mother    Cancer Mother        breast   Heart disease Father    Heart disease Brother    Bipolar disorder Brother    Heart disease Sister     Breast cancer Neg Hx    Social History:  Social History   Substance and Sexual Activity  Alcohol Use No   Alcohol/week: 0.0 standard drinks of alcohol     Social History   Substance and Sexual Activity  Drug Use No    Social History   Socioeconomic History   Marital status: Married    Spouse name: Marinell   Number of children: 3   Years of education: Not on file   Highest education level: Not on file  Occupational History   Not on file  Tobacco Use   Smoking status: Former    Current packs/day: 0.00    Types: Cigarettes    Quit date: 07/16/1973    Years since quitting: 50.1   Smokeless tobacco: Never  Vaping Use   Vaping status: Never Used  Substance and Sexual Activity   Alcohol use: No    Alcohol/week: 0.0 standard drinks of alcohol   Drug use: No   Sexual activity: Yes  Other Topics Concern   Not on file  Social History Narrative   Not on file   Social Drivers of Health   Financial Resource Strain: Not on file  Food Insecurity: No Food Insecurity (08/03/2023)   Hunger Vital Sign    Worried About Running Out of Food in the Last Year: Never true    Ran Out of Food in the Last Year: Never true  Transportation Needs: No Transportation Needs (08/03/2023)  PRAPARE - Administrator, Civil Service (Medical): No    Lack of Transportation (Non-Medical): No  Physical Activity: Not on file  Stress: Not on file  Social Connections: Moderately Integrated (08/03/2023)   Social Connection and Isolation Panel [NHANES]    Frequency of Communication with Friends and Family: Once a week    Frequency of Social Gatherings with Friends and Family: Once a week    Attends Religious Services: 1 to 4 times per year    Active Member of Clubs or Organizations: Yes    Attends Banker Meetings: 1 to 4 times per year    Marital Status: Married   Past Medical History:  Past Medical History:  Diagnosis Date   Abnormal Pap smear of cervix 11/11/2015   Acute HFrEF  (heart failure with reduced ejection fraction) (HCC) 02/22/2023   Acute ST elevation myocardial infarction (STEMI) of anterior wall (HCC) 02/15/2023   Anxiety    CVA (cerebral infarction)    Depressive disorder    GERD (gastroesophageal reflux disease)    Hyperlipidemia    Hypertension    IFG (impaired fasting glucose)    Osteoporosis    ST elevation myocardial infarction (STEMI) (HCC) 02/17/2023    Past Surgical History:  Procedure Laterality Date   APPENDECTOMY     CORONARY IMAGING/OCT N/A 02/21/2023   Procedure: CORONARY IMAGING/OCT;  Surgeon: Mady Bruckner, MD;  Location: [facility] INVASIVE CV LAB;  Service: Cardiovascular;  Laterality: N/A;   CORONARY PRESSURE/FFR STUDY N/A 02/21/2023   Procedure: CORONARY PRESSURE/FFR STUDY;  Surgeon: Mady Bruckner, MD;  Location: [facility] INVASIVE CV LAB;  Service: Cardiovascular;  Laterality: N/A;   CORONARY STENT INTERVENTION N/A 02/21/2023   Procedure: CORONARY STENT INTERVENTION;  Surgeon: Mady Bruckner, MD;  Location: [facility] INVASIVE CV LAB;  Service: Cardiovascular;  Laterality: N/A;   CORONARY/GRAFT ACUTE MI REVASCULARIZATION N/A 02/15/2023   Procedure: Coronary/Graft Acute MI Revascularization;  Surgeon: Darron Deatrice LABOR, MD;  Location: [facility] INVASIVE CV LAB;  Service: Cardiovascular;  Laterality: N/A;   LEFT HEART CATH AND CORONARY ANGIOGRAPHY N/A 02/15/2023   Procedure: LEFT HEART CATH AND CORONARY ANGIOGRAPHY;  Surgeon: Darron Deatrice LABOR, MD;  Location: [facility] INVAS[facility] CV [facility];  Service: Cardiovascular;  Laterality: N/A;   LEFT HEART CATH AND CORONARY ANGIOGRAPHY N/A 02/21/2023   Procedure: LEFT HEART CATH AND CORONARY ANGIOGRAPHY;  Surgeon: Mady Bruckner, MD;  Location: [facility] INV[facility] CV [facility];  Service: Cardiovascular;  Laterality: N/A;   TONSILLECTOMY     TUBAL LIGATION      Current Medications: Current Facility-Administered Medications  Medication Dose Route Frequency Provider Last Rate Last Admin   acetaminophen  (TYLENOL ) tablet 650 mg  650 mg  Oral Q6H PRN Melvenia Madelene HERO, NP   650 mg at 08/04/23 0148   alum & mag hydroxide-simeth (MAALOX/MYLANTA) 200-200-20 MG/5ML suspension 30 mL  30 mL Oral Q4H PRN Melvenia Madelene HERO, NP   30 mL at 08/22/23 1820   ARIPiprazole  (ABILIFY ) tablet 2 mg  2 mg Oral Daily Lakresha Stifter, MD   2 mg at 08/25/23 9089   aspirin  chewable tablet 81 mg  81 mg Oral Daily Melvenia Madelene M, NP   81 mg at 08/25/23 9092   atorvastatin  (LIPITOR ) tablet 80 mg  80 mg Oral Daily Melvenia Madelene M, NP   80 mg at 08/25/23 9092   carvedilol  (COREG ) tablet 12.5 mg  12.5 mg Oral BID WC Dixon, Rashaun M, NP   12.5 mg at 08/25/23 0906   haloperidol  (HALDOL ) tablet 5 mg  5 mg Oral TID PRN Melvenia Madelene HERO, NP       And   diphenhydrAMINE  (BENADRYL ) capsule 50 mg  50 mg Oral TID PRN Melvenia Madelene HERO, NP       haloperidol  lactate (HALDOL ) injection 5 mg  5 mg Intramuscular TID PRN Melvenia Madelene HERO, NP       And   diphenhydrAMINE  (BENADRYL ) injection 50 mg  50 mg Intramuscular TID PRN Melvenia Madelene HERO, NP       And   LORazepam  (ATIVAN ) injection 2 mg  2 mg Intramuscular TID PRN Melvenia Madelene HERO, NP       haloperidol  lactate (HALDOL ) injection 10 mg  10 mg Intramuscular TID PRN Melvenia Madelene HERO, NP       And   diphenhydrAMINE  (BENADRYL ) injection 50 mg  50 mg Intramuscular TID PRN Melvenia Madelene HERO, NP       And   LORazepam  (ATIVAN ) injection 2 mg  2 mg Intramuscular TID PRN Melvenia, Rashaun M, NP       feeding supplement (ENSURE ENLIVE / ENSURE PLUS) liquid 237 mL  237 mL Oral BID BM Massengill, Rankin, MD   237 mL at 08/25/23 0910   hydrOXYzine  (ATARAX ) tablet 25 mg  25 mg Oral TID PRN Melvenia Madelene HERO, NP   25 mg at 08/20/23 1023   losartan  (COZAAR ) tablet 50 mg  50 mg Oral Daily Melvenia Madelene M, NP   50 mg at 08/25/23 9092   magnesium  hydroxide (MILK OF MAGNESIA) suspension 30 mL  30 mL Oral Daily PRN Melvenia Madelene HERO, NP       multivitamin with minerals tablet 1 tablet  1 tablet Oral Daily Massengill, Nathan, MD   1 tablet at  08/25/23 9092   sertraline  (ZOLOFT ) tablet 100 mg  100 mg Oral Daily Temprance Wyre, MD   100 mg at 08/25/23 9092   ticagrelor  (BRILINTA ) tablet 90 mg  90 mg Oral BID Melvenia Madelene HERO, NP   90 mg at 08/25/23 9092   traZODone  (DESYREL ) tablet 50 mg  50 mg Oral QHS PRN Melvenia Madelene HERO, NP   50 mg at 08/24/23 2114    Lab Results:  No results found for this or any previous visit (from the past 48 hours).   Blood Alcohol level:  Lab Results  Component Value Date   ETH <10 08/02/2023   ETH <10 05/03/2023    Metabolic Disorder Labs: Lab Results  Component Value Date   HGBA1C 5.4 02/15/2023   MPG 108.28 02/15/2023   No results found for: PROLACTIN Lab Results  Component Value Date   CHOL 125 03/14/2023   TRIG 86 03/14/2023   HDL 52 03/14/2023   CHOLHDL 2.4 03/14/2023   VLDL 17 02/15/2023   LDLCALC 56 03/14/2023   LDLCALC 164 (H) 02/15/2023     Psychiatric Specialty Exam:  Presentation  General Appearance:  Casual  Eye Contact: Fair  Speech: More spontaneous  Speech Volume: Soft    Mood and Affect  Mood: Good  Affect: More animated   Thought Process  Thought Processes: Less thought blocking  Descriptions of Associations:Intact  Orientation:Full (Time, Place and Person)  Thought Content:Perseveration; Rumination  Hallucinations:Denies  Ideas of Reference:None  Suicidal Thoughts:Denies  Homicidal Thoughts:Denies   Sensorium  Memory: Fair  Judgment: Limited  Insight: Limited   Executive Functions  Concentration: Improved  Attention Span: Fair  Language: Fair   Psychomotor Activity  Psychomotor Activity: Decreased  Musculoskeletal: Strength & Muscle Tone: within normal limits  Gait & Station: normal Assets  Assets: Manufacturing Systems Engineer; Desire for Improvement    Physical Exam: Physical Exam Vitals and nursing note reviewed.  HENT:     Head: Normocephalic.     Nose: Nose normal.     Mouth/Throat:      Mouth: Mucous membranes are moist.  Eyes:     Pupils: Pupils are equal, round, and reactive to light.  Cardiovascular:     Rate and Rhythm: Normal rate.  Pulmonary:     Breath sounds: Normal breath sounds.  Abdominal:     General: Bowel sounds are normal.  Skin:    General: Skin is warm.  Neurological:     General: No focal deficit present.     Mental Status: She is alert.    Review of Systems  Constitutional: Negative.   HENT: Negative.    Eyes: Negative.   Respiratory: Negative.    Cardiovascular: Negative.   Gastrointestinal: Negative.   Skin: Negative.   Neurological: Negative.    Blood pressure (!) 164/80, pulse 68, temperature 98.4 F (36.9 C), resp. rate 18, height 5' 7 (1.702 m), weight 64.6 kg, SpO2 97%. Body mass index is 22.32 kg/m.  Diagnosis: Principal Problem:   MDD (major depressive disorder) Active Problems:   Hyperlipidemia   Chronic kidney disease, stage 3 (HCC)   Ischemic cardiomyopathy   Essential hypertension   Severe major depression without psychotic features (HCC)   Acute encephalopathy   PLAN: Safety and Monitoring:Patient signed voluntary 08/12/23  -- Involuntary admission to inpatient psychiatric unit for safety, stabilization and treatment  -- Daily contact with patient to assess and evaluate symptoms and progress in treatment  -- Patient's case to be discussed in multi-disciplinary team meeting  -- Observation Level : q15 minute checks  -- Vital signs:  q12 hours  -- Precautions: suicide, elopement, and assault -- Encouraged patient to participate in unit milieu and in scheduled group therapies   2. Psychiatric Diagnoses and Treatment:    Zoloft  100 mg once daily for depression               Added  2 mg of Abilify  to augment the effect of Zoloft , 08/24/23   3. 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 Chronic white matter disease and remote callosal, bilateral basal ganglia, thalami and right cerebellar infarcts.   IMPRESSION: 1. No evidence of dural venous sinus thrombosis. 2. 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 No further recommendations by the radiology Follow up with outpatient Neurologist, d/w the patient and his husband  4. Discharge Planning:   -- Social work and case management to assist with discharge planning and identification of hospital follow-up needs prior to discharge  -- Discharge date to be determined, probably 2-3 days   Wenceslao Harries, MD

## 2023-08-25 NOTE — Progress Notes (Signed)
 D: Pt alert and oriented x 2. Pt denies experiencing any anxiety/depression at this time. Pt denies experiencing any pain at this time. Pt denies experiencing any SI/HI, or AVH at this time. Visited with husband this shift.    A: Scheduled medications administered to pt, per MD orders. Support and encouragement provided. Frequent verbal contact made. Routine safety checks conducted q15 minutes.   R: No adverse drug reactions noted. Pt verbally contracts for safety at this time. Pt complaint with medications. Pt interacts minimally with others on the unit. Pt remains safe at this time.

## 2023-08-25 NOTE — Plan of Care (Signed)
  Problem: Education: Goal: Ability to make informed decisions regarding treatment will improve Outcome: Progressing   Problem: Coping: Goal: Coping ability will improve Outcome: Progressing   Problem: Health Behavior/Discharge Planning: Goal: Identification of resources available to assist in meeting health care needs will improve Outcome: Progressing   Problem: Medication: Goal: Compliance with prescribed medication regimen will improve Outcome: Progressing   Problem: Self-Concept: Goal: Will verbalize positive feelings about self Outcome: Progressing

## 2023-08-25 NOTE — BH IP Treatment Plan (Signed)
 Interdisciplinary Treatment and Diagnostic Plan Update  08/25/2023 Time of Session: 2:15 pm Karina Freeman MRN: [id number]  Principal Diagnosis: MDD (major depressive disorder)  Secondary Diagnoses: Principal Problem:   MDD (major depressive disorder) Active Problems:   Hyperlipidemia   Chronic kidney disease, stage 3 (HCC)   Ischemic cardiomyopathy   Essential hypertension   Severe major depression without psychotic features (HCC)   Acute encephalopathy   Current Medications:  Current Facility-Administered Medications  Medication Dose Route Frequency Provider Last Rate Last Admin   acetaminophen  (TYLENOL ) tablet 650 mg  650 mg Oral Q6H PRN Melvenia Madelene HERO, NP   650 mg at 08/04/23 0148   alum & mag hydroxide-simeth (MAALOX/MYLANTA) 200-200-20 MG/5ML suspension 30 mL  30 mL Oral Q4H PRN Melvenia Madelene HERO, NP   30 mL at 08/22/23 1820   ARIPiprazole  (ABILIFY ) tablet 2 mg  2 mg Oral Daily Parmar, Meenakshi, MD   2 mg at 08/25/23 9089   aspirin  chewable tablet 81 mg  81 mg Oral Daily Melvenia Madelene M, NP   81 mg at 08/25/23 9092   atorvastatin  (LIPITOR ) tablet 80 mg  80 mg Oral Daily Melvenia Madelene M, NP   80 mg at 08/25/23 9092   carvedilol  (COREG ) tablet 12.5 mg  12.5 mg Oral BID WC Dixon, Rashaun M, NP   12.5 mg at 08/25/23 9093   haloperidol  (HALDOL ) tablet 5 mg  5 mg Oral TID PRN Melvenia Madelene HERO, NP       And   diphenhydrAMINE  (BENADRYL ) capsule 50 mg  50 mg Oral TID PRN Melvenia Madelene HERO, NP       haloperidol  lactate (HALDOL ) injection 5 mg  5 mg Intramuscular TID PRN Melvenia Madelene HERO, NP       And   diphenhydrAMINE  (BENADRYL ) injection 50 mg  50 mg Intramuscular TID PRN Melvenia Madelene HERO, NP       And   LORazepam  (ATIVAN ) injection 2 mg  2 mg Intramuscular TID PRN Melvenia Madelene HERO, NP       haloperidol  lactate (HALDOL ) injection 10 mg  10 mg Intramuscular TID PRN Melvenia Madelene HERO, NP       And   diphenhydrAMINE  (BENADRYL ) injection 50 mg  50 mg Intramuscular TID PRN Melvenia Madelene HERO, NP       And   LORazepam  (ATIVAN ) injection 2 mg  2 mg Intramuscular TID PRN Melvenia, Rashaun M, NP       feeding supplement (ENSURE ENLIVE / ENSURE PLUS) liquid 237 mL  237 mL Oral BID BM Massengill, Rankin, MD   237 mL at 08/25/23 0910   hydrOXYzine  (ATARAX ) tablet 25 mg  25 mg Oral TID PRN Melvenia Madelene HERO, NP   25 mg at 08/20/23 1023   losartan  (COZAAR ) tablet 50 mg  50 mg Oral Daily Melvenia Madelene M, NP   50 mg at 08/25/23 9092   magnesium  hydroxide (MILK OF MAGNESIA) suspension 30 mL  30 mL Oral Daily PRN Melvenia Madelene HERO, NP       multivitamin with minerals tablet 1 tablet  1 tablet Oral Daily Massengill, Nathan, MD   1 tablet at 08/25/23 9092   sertraline  (ZOLOFT ) tablet 100 mg  100 mg Oral Daily Parmar, Meenakshi, MD   100 mg at 08/25/23 9092   ticagrelor  (BRILINTA ) tablet 90 mg  90 mg Oral BID Melvenia Madelene M, NP   90 mg at 08/25/23 9092   traZODone  (DESYREL ) tablet 50 mg  50 mg Oral QHS PRN Dixon,  Madelene HERO, NP   50 mg at 08/24/23 2114   PTA Medications: Medications Prior to Admission  Medication Sig Dispense Refill Last Dose/Taking   amLODipine  (NORVASC ) 5 MG tablet Take 1 tablet (5 mg total) by mouth at bedtime. 90 tablet 1    aspirin  81 MG chewable tablet Chew 1 tablet (81 mg total) by mouth daily. 90 tablet 3    atorvastatin  (LIPITOR ) 80 MG tablet Take 1 tablet (80 mg total) by mouth daily. 90 tablet 3    carvedilol  (COREG ) 12.5 MG tablet Take 1 tablet (12.5 mg total) by mouth 2 (two) times daily with a meal. 180 tablet 3    losartan  (COZAAR ) 50 MG tablet Take 1 tablet (50 mg total) by mouth daily. 90 tablet 1    nitroGLYCERIN  (NITROSTAT ) 0.4 MG SL tablet Place 1 tablet (0.4 mg total) under the tongue every 5 (five) minutes as needed for chest pain. 100 tablet 3    sertraline  (ZOLOFT ) 50 MG tablet Take 1 tablet (50 mg total) by mouth daily. 90 tablet 1    ticagrelor  (BRILINTA ) 90 MG TABS tablet Take 1 tablet (90 mg total) by mouth 2 (two) times daily. 180 tablet 1     traZODone  (DESYREL ) 50 MG tablet Take 1 tablet (50 mg total) by mouth at bedtime as needed for sleep. 30 tablet 3     Patient Stressors: Marital or family conflict    Patient Strengths: Capable of independent living  Supportive family/friends   Treatment Modalities: Medication Management, Group therapy, Case management,  1 to 1 session with clinician, Psychoeducation, Recreational therapy.   Physician Treatment Plan for Primary Diagnosis: MDD (major depressive disorder) Long Term Goal(s): Improvement in symptoms so as ready for discharge   Short Term Goals: Ability to verbalize feelings will improve Ability to disclose and discuss suicidal ideas Ability to demonstrate self-control will improve Ability to identify and develop effective coping behaviors will improve Ability to maintain clinical measurements within normal limits will improve Compliance with prescribed medications will improve Ability to identify triggers associated with substance abuse/mental health issues will improve  Medication Management: Evaluate patient's response, side effects, and tolerance of medication regimen.  Therapeutic Interventions: 1 to 1 sessions, Unit Group sessions and Medication administration.  Evaluation of Outcomes: Progressing  Physician Treatment Plan for Secondary Diagnosis: Principal Problem:   MDD (major depressive disorder) Active Problems:   Hyperlipidemia   Chronic kidney disease, stage 3 (HCC)   Ischemic cardiomyopathy   Essential hypertension   Severe major depression without psychotic features (HCC)   Acute encephalopathy  Long Term Goal(s): Improvement in symptoms so as ready for discharge   Short Term Goals: Ability to verbalize feelings will improve Ability to disclose and discuss suicidal ideas Ability to demonstrate self-control will improve Ability to identify and develop effective coping behaviors will improve Ability to maintain clinical measurements within normal  limits will improve Compliance with prescribed medications will improve Ability to identify triggers associated with substance abuse/mental health issues will improve     Medication Management: Evaluate patient's response, side effects, and tolerance of medication regimen.  Therapeutic Interventions: 1 to 1 sessions, Unit Group sessions and Medication administration.  Evaluation of Outcomes: Progressing   RN Treatment Plan for Primary Diagnosis: MDD (major depressive disorder) Long Term Goal(s): Knowledge of disease and therapeutic regimen to maintain health will improve  Short Term Goals: Ability to remain free from injury will improve, Ability to verbalize frustration and anger appropriately will improve, Ability to demonstrate self-control, Ability to  participate in decision making will improve, Ability to verbalize feelings will improve, Ability to disclose and discuss suicidal ideas, Ability to identify and develop effective coping behaviors will improve, and Compliance with prescribed medications will improve  Medication Management: RN will administer medications as ordered by provider, will assess and evaluate patient's response and provide education to patient for prescribed medication. RN will report any adverse and/or side effects to prescribing provider.  Therapeutic Interventions: 1 on 1 counseling sessions, Psychoeducation, Medication administration, Evaluate responses to treatment, Monitor vital signs and CBGs as ordered, Perform/monitor CIWA, COWS, AIMS and Fall Risk screenings as ordered, Perform wound care treatments as ordered.  Evaluation of Outcomes: Progressing   LCSW Treatment Plan for Primary Diagnosis: MDD (major depressive disorder) Long Term Goal(s): Safe transition to appropriate next level of care at discharge, Engage patient in therapeutic group addressing interpersonal concerns.  Short Term Goals: Engage patient in aftercare planning with referrals and  resources, Increase social support, Increase ability to appropriately verbalize feelings, Increase emotional regulation, Facilitate acceptance of mental health diagnosis and concerns, and Increase skills for wellness and recovery  Therapeutic Interventions: Assess for all discharge needs, 1 to 1 time with Social worker, Explore available resources and support systems, Assess for adequacy in community support network, Educate family and significant other(s) on suicide prevention, Complete Psychosocial Assessment, Interpersonal group therapy.  Evaluation of Outcomes: Progressing   Progress in Treatment: Attending groups: Yes. and No. Participating in groups: Yes. and No. Taking medication as prescribed: Yes. Toleration medication: Yes. Family/Significant other contact made: Yes, individual(s) contacted:  Kaybree Williams, [phone] Patient understands diagnosis: Yes. Discussing patient identified problems/goals with staff: Yes. Medical problems stabilized or resolved: Yes. Denies suicidal/homicidal ideation: Yes. Issues/concerns per patient self-inventory: No. Other: None  New problem(s) identified: No, Describe:  None identified Update 08/15/23: No changes at this time Update 08/20/23: No changes at this time  Update 08/25/2023: No changes at this time.   New Short Term/Long Term Goal(s): elimination of symptoms of psychosis, medication management for mood stabilization; elimination of SI thoughts; development of comprehensive mental wellness plan. Update 08/15/23: No changes at this time  Update 08/20/23: No changes at this time   Update 08/25/2023: No changes at this time.   Patient Goals:  Pt declined identifying goals at this time Update 08/15/23: No changes at this time  Update 08/20/23: No changes at this time   Update 08/25/2023: No changes at this time.   Discharge Plan or Barriers: CSW will assist with appropriate discharge planning Update 08/15/23: No changes at this time  Update 08/20/23: No  changes at this time   Update 08/25/2023: No changes at this time.   Reason for Continuation of Hospitalization: Depression Medication stabilization   Estimated Length of Stay: 1 to 7 days Update 08/15/23: TBD  Update 08/20/23: TBD    Update 08/25/2023: TBD   Last 3 [place] Suicide Severity Risk Score: Flowsheet Row Admission (Current) from 08/03/2023 in [facility] ED from 08/02/2023 in [facility] Department at [facility] Admission (Discharged) from 05/04/2023 in [facility]  C-SSRS RISK CATEGORY High Risk High Risk No Risk       Last PHQ 2/9 Scores:    05/28/2023    3:20 PM 04/04/2023    3:55 PM 03/26/2023    1:59 PM  Depression screen PHQ 2/9  Decreased Interest 0 0 1  Down, Depressed, Hopeless 0 0 0  PHQ - 2 Score 0 0 1  Altered sleeping 0 1  1  Tired, decreased energy 0 1 1  Change in appetite 0 0 0  Feeling bad or failure about yourself  0 0 0  Trouble concentrating 0 1 1  Moving slowly or fidgety/restless 0 0 1  Suicidal thoughts 0 0 0  PHQ-9 Score 0 3 5  Difficult doing work/chores Not difficult at all Not difficult at all Not difficult at all    Scribe for Treatment Team: Roselyn GORMAN Lento, LCSW 08/25/2023 2:15 PM

## 2023-08-26 ENCOUNTER — Other Ambulatory Visit: Payer: Self-pay | Admitting: Internal Medicine

## 2023-08-26 ENCOUNTER — Telehealth: Payer: Self-pay | Admitting: Internal Medicine

## 2023-08-26 DIAGNOSIS — F332 Major depressive disorder, recurrent severe without psychotic features: Secondary | ICD-10-CM | POA: Diagnosis not present

## 2023-08-26 NOTE — Group Note (Signed)
 Date:  08/26/2023 Time:  5:32 PM  Group Topic/Focus:  Orientation:   The focus of this group is to educate the patient on the purpose and policies of crisis stabilization and provide a format to answer questions about their admission.  The group details unit policies and expectations of patients while admitted.    Participation Level:  Active  Participation Quality:  Appropriate, Attentive, and Supportive  Affect:  Appropriate  Cognitive:  Alert, Appropriate, and Oriented  Insight: Appropriate, Good, and Improving  Engagement in Group:  Engaged  Modes of Intervention:  Orientation and Socialization  Additional Comments:     Fleet Huh 08/26/2023, 5:32 PM

## 2023-08-26 NOTE — Progress Notes (Signed)
   08/26/23 0724  Psych Admission Type (Psych Patients Only)  Admission Status Voluntary  Psychosocial Assessment  Patient Complaints Anxiety  Eye Contact Brief  Facial Expression Flat  Affect Depressed  Speech Soft  Interaction Minimal  Motor Activity Slow  Appearance/Hygiene Unremarkable  Behavior Characteristics Cooperative  Mood Pleasant  Thought Process  Coherency WDL  Content WDL  Delusions None reported or observed  Perception WDL  Hallucination None reported or observed  Judgment Impaired  Confusion Mild  Danger to Self  Current suicidal ideation? Denies

## 2023-08-26 NOTE — Plan of Care (Signed)
  Problem: Education: Goal: Ability to make informed decisions regarding treatment will improve Outcome: Progressing   Problem: Coping: Goal: Coping ability will improve Outcome: Progressing   Problem: Health Behavior/Discharge Planning: Goal: Identification of resources available to assist in meeting health care needs will improve Outcome: Progressing   Problem: Medication: Goal: Compliance with prescribed medication regimen will improve Outcome: Progressing   Problem: Self-Concept: Goal: Will verbalize positive feelings about self Outcome: Progressing

## 2023-08-26 NOTE — Progress Notes (Signed)
 [facility] MD Progress Note  08/26/2023  DRAKE CRANMER  MRN:  [id number]  Patient is a 73 year old female presenting to [facility] ED under IVC. Per triage note Pt husband and daughter left pt at home to go run errands and found that pt right ear was bleeding when they returned home and found a wire clothes hanger on the bathroom floor.  Patient was evaluated in the emergency room and admitted to [facility] due to psych unit.  Patient was admitted to the unit in October 2020 for for depression and suicide attempt by overdose.  Subjective: Chart was reviewed and the patient was seen.  The case was discussed with nursing staff and the treatment team.   .  Today on interview patient is noted to be resting in her bed.  Per nursing staff patient is more visible on the unit, participating in groups, coming out for breakfast lunch and dinner, attending to her ADLs.  Patient reports that she is doing well.  She denies feeling depressed or anxious.  She reports that when she goes home her husband has told her that he will be taking her wherever he goes so that she does not stay alone at the house.  She denies suicidal/homicidal ideation/plan.  She did not endorse any auditory/visual hallucinations.  No overt delusions noted.  She has fair appetite and sleep.  She is taking her medications with no reported side effects.  Patient's husband called on 08/26/2023.  Updated him on the plan about medications including Abilify  and Zoloft .  Husband reported that patient can be discharged home on Wednesday or Thursday.  Husband also informed the provider that he will be taking patient to the outpatient neurologist appointments to get further evaluated on the aneurysm.  All questions were answered. Sleep: Fair  Appetite:  Fair  Past Psychiatric History: see h&P Family History:  Family History  Problem Relation Age of Onset   Heart disease Mother    Cancer Mother        breast   Heart disease Father    Heart disease Brother     Bipolar disorder Brother    Heart disease Sister    Breast cancer Neg Hx    Social History:  Social History   Substance and Sexual Activity  Alcohol Use No   Alcohol/week: 0.0 standard drinks of alcohol     Social History   Substance and Sexual Activity  Drug Use No    Social History   Socioeconomic History   Marital status: Married    Spouse name: [name]   Number of children: 3   Years of education: Not on file   Highest education level: Not on file  Occupational History   Not on file  Tobacco Use   Smoking status: Former    Current packs/day: 0.00    Types: Cigarettes    Quit date: 07/16/1973    Years since quitting: 50.1   Smokeless tobacco: Never  Vaping Use   Vaping status: Never Used  Substance and Sexual Activity   Alcohol use: No    Alcohol/week: 0.0 standard drinks of alcohol   Drug use: No   Sexual activity: Yes  Other Topics Concern   Not on file  Social History Narrative   Not on file   Social Drivers of Health   Financial Resource Strain: Not on file  Food Insecurity: No Food Insecurity (08/03/2023)   Hunger Vital Sign    Worried About Running Out of Food in the Last  Year: Never true    Ran Out of Food in the Last Year: Never true  Transportation Needs: No Transportation Needs (08/03/2023)   PRAPARE - Administrator, Civil Service (Medical): No    Lack of Transportation (Non-Medical): No  Physical Activity: Not on file  Stress: Not on file  Social Connections: Moderately Integrated (08/03/2023)   Social Connection and Isolation Panel [NHANES]    Frequency of Communication with Friends and Family: Once a week    Frequency of Social Gatherings with Friends and Family: Once a week    Attends Religious Services: 1 to 4 times per year    Active Member of Clubs or Organizations: Yes    Attends Banker Meetings: 1 to 4 times per year    Marital Status: Married   Past Medical History:  Past Medical History:  Diagnosis Date    Abnormal Pap smear of cervix 11/11/2015   Acute HFrEF (heart failure with reduced ejection fraction) (HCC) 02/22/2023   Acute ST elevation myocardial infarction (STEMI) of anterior wall (HCC) 02/15/2023   Anxiety    CVA (cerebral infarction)    Depressive disorder    GERD (gastroesophageal reflux disease)    Hyperlipidemia    Hypertension    IFG (impaired fasting glucose)    Osteoporosis    ST elevation myocardial infarction (STEMI) (HCC) 02/17/2023    Past Surgical History:  Procedure Laterality Date   APPENDECTOMY     CORONARY IMAGING/OCT N/A 02/21/2023   Procedure: CORONARY IMAGING/OCT;  Surgeon: Sammy Crisp, MD;  Location: [facility] INVASIVE CV LAB;  Service: Cardiovascular;  Laterality: N/A;   CORONARY PRESSURE/FFR STUDY N/A 02/21/2023   Procedure: CORONARY PRESSURE/FFR STUDY;  Surgeon: Sammy Crisp, MD;  Location: [facility] INVASIVE CV LAB;  Service: Cardiovascular;  Laterality: N/A;   CORONARY STENT INTERVENTION N/A 02/21/2023   Procedure: CORONARY STENT INTERVENTION;  Surgeon: Sammy Crisp, MD;  Location: [facility] INVASIVE CV LAB;  Service: Cardiovascular;  Laterality: N/A;   CORONARY/GRAFT ACUTE MI REVASCULARIZATION N/A 02/15/2023   Procedure: Coronary/Graft Acute MI Revascularization;  Surgeon: Wenona Hamilton, MD;  Location: [facility] INVASIVE CV LAB;  Service: Cardiovascular;  Laterality: N/A;   LEFT HEART CATH AND CORONARY ANGIOGRAPHY N/A 02/15/2023   Procedure: LEFT HEART CATH AND CORONARY ANGIOGRAPHY;  Surgeon: Wenona Hamilton, MD;  Location: [facility] INVASIVE CV LAB;  Service: Cardiovascular;  Laterality: N/A;   LEFT HEART CATH AND CORONARY ANGIOGRAPHY N/A 02/21/2023   Procedure: LEFT HEART CATH AND CORONARY ANGIOGRAPHY;  Surgeon: Sammy Crisp, MD;  Location: [facility] INVASIVE CV LAB;  Service: Cardiovascular;  Laterality: N/A;   TONSILLECTOMY     TUBAL LIGATION      Current Medications: Current Facility-Administered Medications  Medication Dose Route Frequency Provider Last Rate Last  Admin   acetaminophen  (TYLENOL ) tablet 650 mg  650 mg Oral Q6H PRN Al Alias, NP   650 mg at 08/04/23 0148   alum & mag hydroxide-simeth (MAALOX/MYLANTA) 200-200-20 MG/5ML suspension 30 mL  30 mL Oral Q4H PRN Al Alias, NP   30 mL at 08/22/23 1820   ARIPiprazole  (ABILIFY ) tablet 2 mg  2 mg Oral Daily Parmar, Meenakshi, MD   2 mg at 08/26/23 0831   aspirin  chewable tablet 81 mg  81 mg Oral Daily Dixon, Rashaun M, NP   81 mg at 08/26/23 0831   atorvastatin  (LIPITOR ) tablet 80 mg  80 mg Oral Daily Cicero Crawley M, NP   80 mg at 08/26/23 0831   carvedilol  (COREG ) tablet 12.5  mg  12.5 mg Oral BID WC Dixon, Rashaun M, NP   12.5 mg at 08/26/23 0831   haloperidol  (HALDOL ) tablet 5 mg  5 mg Oral TID PRN Al Alias, NP       And   diphenhydrAMINE  (BENADRYL ) capsule 50 mg  50 mg Oral TID PRN Al Alias, NP       haloperidol  lactate (HALDOL ) injection 5 mg  5 mg Intramuscular TID PRN Al Alias, NP       And   diphenhydrAMINE  (BENADRYL ) injection 50 mg  50 mg Intramuscular TID PRN Al Alias, NP       And   LORazepam  (ATIVAN ) injection 2 mg  2 mg Intramuscular TID PRN Al Alias, NP       haloperidol  lactate (HALDOL ) injection 10 mg  10 mg Intramuscular TID PRN Al Alias, NP       And   diphenhydrAMINE  (BENADRYL ) injection 50 mg  50 mg Intramuscular TID PRN Al Alias, NP       And   LORazepam  (ATIVAN ) injection 2 mg  2 mg Intramuscular TID PRN Kermit Ped, Rashaun M, NP       feeding supplement (ENSURE ENLIVE / ENSURE PLUS) liquid 237 mL  237 mL Oral BID BM Massengill, Elana Grayer, MD   237 mL at 08/26/23 7846   hydrOXYzine  (ATARAX ) tablet 25 mg  25 mg Oral TID PRN Al Alias, NP   25 mg at 08/26/23 0831   losartan  (COZAAR ) tablet 50 mg  50 mg Oral Daily Al Alias, NP   50 mg at 08/26/23 9629   magnesium  hydroxide (MILK OF MAGNESIA) suspension 30 mL  30 mL Oral Daily PRN Al Alias, NP       multivitamin with minerals tablet 1 tablet  1  tablet Oral Daily Massengill, Nathan, MD   1 tablet at 08/26/23 0830   sertraline  (ZOLOFT ) tablet 100 mg  100 mg Oral Daily Parmar, Meenakshi, MD   100 mg at 08/26/23 0831   ticagrelor  (BRILINTA ) tablet 90 mg  90 mg Oral BID Cicero Crawley M, NP   90 mg at 08/26/23 5284   traZODone  (DESYREL ) tablet 50 mg  50 mg Oral QHS PRN Al Alias, NP   50 mg at 08/25/23 2209    Lab Results:  No results found for this or any previous visit (from the past 48 hours).   Blood Alcohol level:  Lab Results  Component Value Date   ETH <10 08/02/2023   ETH <10 05/03/2023    Metabolic Disorder Labs: Lab Results  Component Value Date   HGBA1C 5.4 02/15/2023   MPG 108.28 02/15/2023   No results found for: "PROLACTIN" Lab Results  Component Value Date   CHOL 125 03/14/2023   TRIG 86 03/14/2023   HDL 52 03/14/2023   CHOLHDL 2.4 03/14/2023   VLDL 17 02/15/2023   LDLCALC 56 03/14/2023   LDLCALC 164 (H) 02/15/2023     Psychiatric Specialty Exam:  Presentation  General Appearance:  Casual  Eye Contact: Fair  Speech: More spontaneous  Speech Volume: Soft    Mood and Affect  Mood: "Good"  Affect: More animated   Thought Process  Thought Processes: Less thought blocking  Descriptions of Associations:Intact  Orientation:Partial  Thought Content:Logical  Hallucinations:Denies  Ideas of Reference:None  Suicidal Thoughts:Denies  Homicidal Thoughts:Denies   Sensorium  Memory: Fair  Judgment: Limited  Insight: Limited   Executive Functions  Concentration: Improved  Attention Span:  Poor  Language: Fair   Psychomotor Activity  Psychomotor Activity: Decreased  Musculoskeletal: Strength & Muscle Tone: within normal limits Gait & Station: normal Assets  Assets: Desire for Improvement; Communication Skills; Resilience    Physical Exam: Physical Exam Vitals and nursing note reviewed.  HENT:     Head: Normocephalic.     Nose: Nose normal.      Mouth/Throat:     Mouth: Mucous membranes are moist.  Eyes:     Pupils: Pupils are equal, round, and reactive to light.  Cardiovascular:     Rate and Rhythm: Normal rate.  Pulmonary:     Breath sounds: Normal breath sounds.  Abdominal:     General: Bowel sounds are normal.  Skin:    General: Skin is warm.  Neurological:     General: No focal deficit present.     Mental Status: She is alert.    Review of Systems  Constitutional: Negative.   HENT: Negative.    Eyes: Negative.   Respiratory: Negative.    Cardiovascular: Negative.   Gastrointestinal: Negative.   Skin: Negative.   Neurological: Negative.    Blood pressure (!) 133/53, pulse 64, temperature 98.1 F (36.7 C), resp. rate 18, height 5\' 7"  (1.702 m), weight 64.6 kg, SpO2 98%. Body mass index is 22.32 kg/m.  Diagnosis: Principal Problem:   MDD (major depressive disorder) Active Problems:   Hyperlipidemia   Chronic kidney disease, stage 3 (HCC)   Ischemic cardiomyopathy   Essential hypertension   Severe major depression without psychotic features (HCC)   Acute encephalopathy   PLAN: Safety and Monitoring:Patient signed voluntary 08/12/23  -- Involuntary admission to inpatient psychiatric unit for safety, stabilization and treatment  -- Daily contact with patient to assess and evaluate symptoms and progress in treatment  -- Patient's case to be discussed in multi-disciplinary team meeting  -- Observation Level : q15 minute checks  -- Vital signs:  q12 hours  -- Precautions: suicide, elopement, and assault -- Encouraged patient to participate in unit milieu and in scheduled group therapies   2. Psychiatric Diagnoses and Treatment:    Zoloft  100 mg once daily for depression               Continue 2 mg of Abilify  to augment the effect of Zoloft , added on 08/24/23   3. 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 Chronic white matter disease and remote callosal, bilateral basal ganglia, thalami and right cerebellar infarcts.   IMPRESSION: 1. No evidence of dural venous sinus thrombosis. 2. 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 No further recommendations by the radiology Follow up with outpatient Neurologist, d/w the patient and his husband  4. Discharge Planning:   -- Social work and case management to assist with discharge planning and identification of hospital follow-up needs prior to discharge  -- Discharge date to be determined, probably 2-3 days   Mckynlee Luse, MD

## 2023-08-26 NOTE — Telephone Encounter (Signed)
**Note De-identified  [place] Obfuscation** Please advise 

## 2023-08-26 NOTE — Progress Notes (Signed)
   08/25/23 2109  Psych Admission Type (Psych Patients Only)  Admission Status Voluntary  Psychosocial Assessment  Patient Complaints Anxiety;Depression  Eye Contact Brief  Facial Expression Flat;Sad  Affect Depressed  Speech Soft  Interaction Minimal  Motor Activity Slow  Appearance/Hygiene Unremarkable  Behavior Characteristics Cooperative  Mood Pleasant  Aggressive Behavior  Effect No apparent injury  Thought Process  Coherency WDL  Content WDL  Delusions None reported or observed  Perception WDL  Hallucination None reported or observed  Judgment Impaired  Confusion Mild  Danger to Self  Current suicidal ideation? Denies  Agreement Not to Harm Self Yes  Description of Agreement Verbal  Danger to Others  Danger to Others None reported or observed

## 2023-08-26 NOTE — Telephone Encounter (Signed)
 Copied from CRM [id number]. Topic: Referral - Request for Referral >> Aug 26, 2023  2:08 PM Baldemar Lev wrote: Reason for CRM: Pt's husband called requesting a referral for Dr. Mason Sole at [facility] to review her recent CT scans.

## 2023-08-26 NOTE — Telephone Encounter (Signed)
 Pt husband is requesting referral for Neuro to be sent to [facility] Koehn Vaslow, MD will call back with the contact information

## 2023-08-26 NOTE — Progress Notes (Unsigned)
 Date:  08/26/2023   Name:  Karina Freeman   DOB:  1951-05-29   MRN:  [id number]   Chief Complaint: No chief complaint on file.  HPI  Review of Systems   Lab Results  Component Value Date   NA 139 08/17/2023   K 3.9 08/17/2023   CO2 26 08/17/2023   GLUCOSE 94 08/17/2023   BUN 24 (H) 08/17/2023   CREATININE 0.99 08/17/2023   CALCIUM  8.4 (L) 08/17/2023   EGFR 53 (L) 03/14/2023   GFRNONAA >60 08/17/2023   Lab Results  Component Value Date   CHOL 125 03/14/2023   HDL 52 03/14/2023   LDLCALC 56 03/14/2023   TRIG 86 03/14/2023   CHOLHDL 2.4 03/14/2023   Lab Results  Component Value Date   TSH 1.050 03/26/2023   Lab Results  Component Value Date   HGBA1C 5.4 02/15/2023   Lab Results  Component Value Date   WBC 7.2 08/17/2023   HGB 10.7 (L) 08/17/2023   HCT 32.4 (L) 08/17/2023   MCV 95.6 08/17/2023   PLT 223 08/17/2023   Lab Results  Component Value Date   ALT 22 08/17/2023   AST 23 08/17/2023   ALKPHOS 45 08/17/2023   BILITOT 0.7 08/17/2023   No results found for: "25OHVITD2", "25OHVITD3", "VD25OH"   Patient Active Problem List   Diagnosis Date Noted   Acute encephalopathy 08/17/2023   MDD (major depressive disorder) 08/03/2023   Suicidal ideation 05/04/2023   Anxiety state 05/04/2023   Acute confusional state 05/04/2023   Insomnia 03/26/2023   Essential hypertension 02/21/2023   Coronary artery disease involving native coronary artery of native heart with unstable angina pectoris (HCC) 02/21/2023   Ischemic cardiomyopathy 02/16/2023   Chronic kidney disease, stage 3 (HCC) 06/25/2016   History of stroke 02/10/2015   Osteoporosis 02/10/2015   Hyperlipidemia 02/10/2015   GERD (gastroesophageal reflux disease) 02/10/2015   Toenail fungus 02/10/2015   Severe major depression without psychotic features (HCC) 02/10/2015    Allergies  Allergen Reactions   Citalopram Hydrobromide Nausea And Vomiting   Codeine Diarrhea and Nausea And Vomiting    Penicillin G Benzathine     Past Surgical History:  Procedure Laterality Date   APPENDECTOMY     CORONARY IMAGING/OCT N/A 02/21/2023   Procedure: CORONARY IMAGING/OCT;  Surgeon: Sammy Crisp, MD;  Location: [facility] INVASIVE CV LAB;  Service: Cardiovascular;  Laterality: N/A;   CORONARY PRESSURE/FFR STUDY N/A 02/21/2023   Procedure: CORONARY PRESSURE/FFR STUDY;  Surgeon: Sammy Crisp, MD;  Location: [facility] INVAS[facility] CV LAB;  Service: Cardiovascular;  Laterality: N/A;   CORONARY STENT INTERVENTION N/A 02/21/2023   Procedure: CORONARY STENT INTERVENTION;  Surgeon: Sammy Crisp, MD;  Location: [facility] LAB;  Service: Cardiovascular;  Laterality: N/A;   CORONARY/GRAFT ACUTE MI REVASCULARIZATION N/A 02/15/2023   Procedure: Coronary/Graft Acute MI Revascularization;  Surgeon: Wenona Hamilton, MD;  Location: [facility];  Service: Cardiovascular;  Laterality: N/A;   LEFT HEART CATH AND CORONARY ANGIOGRAPHY N/A 02/15/2023   Procedure: LEFT HEART CATH AND CORONARY ANGIOGRAPHY;  Surgeon: Wenona Hamilton, MD;  Location: [facility];  Service: Cardiovascular;  Laterality: N/A;   LEFT HEART CATH AND CORONARY ANGIOGRAPHY N/A 02/21/2023   Procedure: LEFT HEART CATH AND CORONARY ANGIOGRAPHY;  Surgeon: Sammy Crisp, MD;  Location: [facility];  Service: Cardiovascular;  Laterality: N/A;   TONSILLECTOMY     TUBAL LIGATION      Social History   Tobacco Use  Smoking status: Former    Current packs/day: 0.00    Types: Cigarettes    Quit date: 07/16/1973    Years since quitting: 50.1   Smokeless tobacco: Never  Vaping Use   Vaping status: Never Used  Substance Use Topics   Alcohol use: No    Alcohol/week: 0.0 standard drinks of alcohol   Drug use: No     Medication list has been reviewed and updated.  No outpatient medications have been marked as taking for the 08/26/23 encounter (Orders Only) with Sheron Dixons, MD.       05/28/2023    3:20 PM 03/26/2023     1:59 PM  GAD 7 : Generalized Anxiety Score  Nervous, Anxious, on Edge 0 0  Control/stop worrying 0 1  Worry too much - different things 0 1  Trouble relaxing 0 0  Restless 0 1  Easily annoyed or irritable 0 1  Afraid - awful might happen 0 0  Total GAD 7 Score 0 4  Anxiety Difficulty Not difficult at all Not difficult at all       05/28/2023    3:20 PM 04/04/2023    3:55 PM 03/26/2023    1:59 PM  Depression screen PHQ 2/9  Decreased Interest 0 0 1  Down, Depressed, Hopeless 0 0 0  PHQ - 2 Score 0 0 1  Altered sleeping 0 1 1  Tired, decreased energy 0 1 1  Change in appetite 0 0 0  Feeling bad or failure about yourself  0 0 0  Trouble concentrating 0 1 1  Moving slowly or fidgety/restless 0 0 1  Suicidal thoughts 0 0 0  PHQ-9 Score 0 3 5  Difficult doing work/chores Not difficult at all Not difficult at all Not difficult at all    BP Readings from Last 3 Encounters:  08/26/23 (!) 133/53  08/03/23 (!) 159/78  05/28/23 118/62    Physical Exam  Wt Readings from Last 3 Encounters:  08/03/23 142 lb 8 oz (64.6 kg)  08/21/23 142 lb (64.4 kg)  08/02/23 135 lb (61.2 kg)    LMP  (LMP Unknown)   Assessment and Plan:  Problem List Items Addressed This Visit   None   No follow-ups on file.    Sheron Dixons, MD [facility] [place]

## 2023-08-26 NOTE — Group Note (Signed)
 Date:  08/26/2023 Time:  1:06 AM  Group Topic/Focus:  Wrap-Up Group:   The focus of this group is to help patients review their daily goal of treatment and discuss progress on daily workbooks.    Participation Level:  Minimal  Participation Quality:  Appropriate  Affect:  Appropriate  Cognitive:  Appropriate  Insight: Improving  Engagement in Group:  Improving  Modes of Intervention:  Discussion  Additional Comments:    Rolland Cline 08/26/2023, 1:06 AM

## 2023-08-26 NOTE — Group Note (Signed)
 Date:  08/26/2023 Time:  8:58 PM  Group Topic/Focus:  Making Healthy Choices:   The focus of this group is to help patients identify negative/unhealthy choices they were using prior to admission and identify positive/healthier coping strategies to replace them upon discharge.    Participation Level:  Active  Participation Quality:  Appropriate  Affect:  Appropriate  Cognitive:  Appropriate  Insight: Appropriate  Engagement in Group:  Engaged  Modes of Intervention:  Education  Additional Comments:    Sherlie Distance 08/26/2023, 8:58 PM

## 2023-08-26 NOTE — Group Note (Signed)
 Recreation Therapy Group Note   Group Topic:Other  Group Date: 08/26/2023 Start Time: 1400 End Time: 1500 Facilitators: Deatrice Factor, LRT, CTRS Location:  Dayroom  Group Description: Bingo. LRT and patients played multiple games of Bingo with music playing in the background. LRT and pts discussed how this could be a leisure interest and the importance of doing things they enjoy post-discharge. Pts won stress balls s Chief Financial Officer.    Goal Area(s) Addressed: Patient will identify leisure interests.  Patient will practice healthy decision making. Patient will engage in recreation activity.  Patient will increase communication.   Affect/Mood: Appropriate and Flat   Participation Level: Active and Engaged   Participation Quality: Independent   Behavior: Appropriate, Calm, and Cooperative   Speech/Thought Process: Coherent   Insight: Fair   Judgement: Good   Modes of Intervention: Activity, Competitive Play, Exploration, Open Conversation, Rapport Building, and Socialization   Patient Response to Interventions:  Attentive, Engaged, Interested , and Receptive   Education Outcome:  Acknowledges education   Clinical Observations/Individualized Feedback: Karina Freeman was active in their participation of session activities and group discussion. Pt won a Facilities manager and received a stress ball as a prize. Pt minimally interacted with LRT and peers duration of session.    Plan: Continue to engage patient in RT group sessions 2-3x/week.   Deatrice Factor, LRT, CTRS 08/26/2023 5:43 PM

## 2023-08-26 NOTE — Progress Notes (Addendum)
   08/26/23 2015  Psych Admission Type (Psych Patients Only)  Admission Status Voluntary  Psychosocial Assessment  Patient Complaints None  Eye Contact Fair  Facial Expression Animated  Affect Appropriate to circumstance  Speech Soft  Interaction Minimal  Motor Activity Slow  Appearance/Hygiene Unremarkable;In scrubs  Behavior Characteristics Cooperative  Mood Pleasant  Thought Process  Coherency WDL  Content WDL  Delusions None reported or observed  Perception WDL  Hallucination None reported or observed  Judgment Impaired  Confusion Mild  Danger to Self  Current suicidal ideation? Denies  Agreement Not to Harm Self Yes  Description of Agreement verbal  Danger to Others  Danger to Others None reported or observed   Progress note   D: Pt seen in dayroom visiting with her daughter. Pt denies SI, HI, AVH. Pt rates pain  0/10. Pt rates anxiety  0/10 and depression  0/10. Pt is smiling and engaging in the milieu. "I feel a lot better." Pt endorses good appetite and sleep. Pt said her family visit was positive. Pt is looking forward to discharge but will be patient until it happens. No other concerns noted at this time.  A: Pt provided support and encouragement. Pt given scheduled medication as prescribed. PRNs as appropriate. Q15 min checks for safety.   R: Pt safe on the unit. Will continue to monitor.

## 2023-08-26 NOTE — Progress Notes (Signed)
   08/26/23 0600  15 Minute Checks  Location Bedroom  Visual Appearance Calm  Behavior Sleeping  Sleep (Behavioral Health Patients Only)  Calculate sleep? (Click Yes once per 24 hr at 0600 safety check) Yes  Documented sleep last 24 hours 8.25

## 2023-08-27 ENCOUNTER — Telehealth: Payer: Self-pay | Admitting: Internal Medicine

## 2023-08-27 ENCOUNTER — Other Ambulatory Visit: Payer: Self-pay

## 2023-08-27 DIAGNOSIS — R9089 Other abnormal findings on diagnostic imaging of central nervous system: Secondary | ICD-10-CM

## 2023-08-27 DIAGNOSIS — R9082 White matter disease, unspecified: Secondary | ICD-10-CM

## 2023-08-27 DIAGNOSIS — F332 Major depressive disorder, recurrent severe without psychotic features: Secondary | ICD-10-CM | POA: Diagnosis not present

## 2023-08-27 NOTE — Telephone Encounter (Signed)
Referral placed for white matter disease/ Abnormal MRI. Dr Judithann Graves agreed.

## 2023-08-27 NOTE — Group Note (Signed)
Date:  08/27/2023 Time:  10:26 AM  Group Topic/Focus:  Coping With Mental Health Crisis:   The purpose of this group is to help patients identify strategies for coping with mental health crisis.  Group discusses possible causes of crisis and ways to manage them effectively.    Participation Level:  Active  Participation Quality:  Appropriate  Affect:  Appropriate  Cognitive:  Appropriate  Insight: Appropriate  Engagement in Group:  Engaged  Modes of Intervention:  Discussion   Ardelle Anton 08/27/2023, 10:26 AM

## 2023-08-27 NOTE — Group Note (Signed)
Recreation Therapy Group Note   Group Topic:Coping Skills  Group Date: 08/27/2023 Start Time: 1400 End Time: 1450 Facilitators: Rosina Lowenstein, LRT, CTRS Location:  Dayroom  Group Description: Coping A-Z. LRT and patients engage in a guided discussion on what coping skills are and gave specific examples. LRT passed out a handout labeled Coping A-Z with blank spaces beside each letter. LRT prompted patients to come up with a coping skill for each of the letters. LRT and patients went over the handout and gave ideas for each letter if anyone had any blanks left on their paper. Patients kept this handout with them that listed 26 different coping skills.   Goal Area(s) Addressed: Patients will be able to define "coping skills". Patient will identify new coping skills.  Patient will increase communication.   Affect/Mood: N/A   Participation Level: Did not attend    Clinical Observations/Individualized Feedback: Karina Freeman was present in the dayroom at the time of group. Pt shared that she is going home tomorrow and didn't want to participate.   Plan: Continue to engage patient in RT group sessions 2-3x/week.   Rosina Lowenstein, LRT, CTRS 08/27/2023 4:54 PM

## 2023-08-27 NOTE — Plan of Care (Signed)
  Problem: Education: Goal: Ability to make informed decisions regarding treatment will improve Outcome: Progressing   Problem: Coping: Goal: Coping ability will improve Outcome: Progressing   Problem: Health Behavior/Discharge Planning: Goal: Identification of resources available to assist in meeting health care needs will improve Outcome: Progressing   Problem: Medication: Goal: Compliance with prescribed medication regimen will improve Outcome: Progressing   Problem: Self-Concept: Goal: Will verbalize positive feelings about self Outcome: Progressing

## 2023-08-27 NOTE — Telephone Encounter (Signed)
Copied from CRM [id number]. Topic: Referral - Request for Referral >> Aug 27, 2023 12:50 PM Everette C wrote: Reason for CRM: The patient's husband has called to specifically request that their wife be referred to Dr. Ferman Hamming, MD for neurology concerns at the following address   [address] [place], [place] [address] Phone [phone]

## 2023-08-27 NOTE — Progress Notes (Signed)
   08/27/23 0600  15 Minute Checks  Location Bedroom  Visual Appearance Calm  Behavior Sleeping  Sleep (Behavioral Health Patients Only)  Calculate sleep? (Click Yes once per 24 hr at 0600 safety check) Yes  Documented sleep last 24 hours 6.75

## 2023-08-27 NOTE — Progress Notes (Signed)
[facility] MD Progress Note  08/27/2023  Karina Freeman  MRN:  [id number]  Patient is a 73 year old female presenting to [facility] ED under IVC. Per triage note Pt husband and daughter left pt at home to go run errands and found that pt right ear was bleeding when they returned home and found a wire clothes hanger on the bathroom floor.  Patient was evaluated in the emergency room and admitted to [facility] due to psych unit.  Patient was admitted to the unit in October 2020 for for depression and suicide attempt by overdose.  Subjective: Chart was reviewed and the patient was seen.  The case was discussed with nursing staff and the treatment team.   .  Patient is noted to be participating in the treatment team in groups.  Per nursing report patient had no behavioral problems and is visible more on the unit interacting with peers and staff, she is able to do her ADLs.  She is taking her medications with no reported side effects.  Patient was very happy to hear that she will be discharged home tomorrow.  She denies SI/HI/plan.  She denies auditory/visual hallucinations.  She is excited to be going home and states that her husband is planning to take her wherever he goes. Sleep: Fair  Appetite:  Fair  Past Psychiatric History: see h&P Family History:  Family History  Problem Relation Age of Onset   Heart disease Mother    Cancer Mother        breast   Heart disease Father    Heart disease Brother    Bipolar disorder Brother    Heart disease Sister    Breast cancer Neg Hx    Social History:  Social History   Substance and Sexual Activity  Alcohol Use No   Alcohol/week: 0.0 standard drinks of alcohol     Social History   Substance and Sexual Activity  Drug Use No    Social History   Socioeconomic History   Marital status: Married    Spouse name: [name]   Number of children: 3   Years of education: Not on file   Highest education level: Not on file  Occupational History   Not on file  Tobacco  Use   Smoking status: Former    Current packs/day: 0.00    Types: Cigarettes    Quit date: 07/16/1973    Years since quitting: 50.1   Smokeless tobacco: Never  Vaping Use   Vaping status: Never Used  Substance and Sexual Activity   Alcohol use: No    Alcohol/week: 0.0 standard drinks of alcohol   Drug use: No   Sexual activity: Yes  Other Topics Concern   Not on file  Social History Narrative   Not on file   Social Drivers of Health   Financial Resource Strain: Not on file  Food Insecurity: No Food Insecurity (08/03/2023)   Hunger Vital Sign    Worried About Running Out of Food in the Last Year: Never true    Ran Out of Food in the Last Year: Never true  Transportation Needs: No Transportation Needs (08/03/2023)   PRAPARE - Administrator, Civil Service (Medical): No    Lack of Transportation (Non-Medical): No  Physical Activity: Not on file  Stress: Not on file  Social Connections: Moderately Integrated (08/03/2023)   Social Connection and Isolation Panel [NHANES]    Frequency of Communication with Friends and Family: Once a week  Frequency of Social Gatherings with Friends and Family: Once a week    Attends Religious Services: 1 to 4 times per year    Active Member of Clubs or Organizations: Yes    Attends Banker Meetings: 1 to 4 times per year    Marital Status: Married   Past Medical History:  Past Medical History:  Diagnosis Date   Abnormal Pap smear of cervix 11/11/2015   Acute HFrEF (heart failure with reduced ejection fraction) (HCC) 02/22/2023   Acute ST elevation myocardial infarction (STEMI) of anterior wall (HCC) 02/15/2023   Anxiety    CVA (cerebral infarction)    Depressive disorder    GERD (gastroesophageal reflux disease)    Hyperlipidemia    Hypertension    IFG (impaired fasting glucose)    Osteoporosis    ST elevation myocardial infarction (STEMI) (HCC) 02/17/2023    Past Surgical History:  Procedure Laterality Date    APPENDECTOMY     CORONARY IMAGING/OCT N/A 02/21/2023   Procedure: CORONARY IMAGING/OCT;  Surgeon: Yvonne Kendall, MD;  Location: [facility] INVASIVE CV LAB;  Service: Cardiovascular;  Laterality: N/A;   CORONARY PRESSURE/FFR STUDY N/A 02/21/2023   Procedure: CORONARY PRESSURE/FFR STUDY;  Surgeon: Yvonne Kendall, MD;  Location: [facility] INVASIVE CV LAB;  Service: Cardiovascular;  Laterality: N/A;   CORONARY STENT INTERVENTION N/A 02/21/2023   Procedure: CORONARY STENT INTERVENTION;  Surgeon: Yvonne Kendall, MD;  Location: [facility] INVASIVE CV LAB;  Service: Cardiovascular;  Laterality: N/A;   CORONARY/GRAFT ACUTE MI REVASCULARIZATION N/A 02/15/2023   Procedure: Coronary/Graft Acute MI Revascularization;  Surgeon: Iran Ouch, MD;  Location: [facility] INVASIVE CV LAB;  Service: Cardiovascular;  Laterality: N/A;   LEFT HEART CATH AND CORONARY ANGIOGRAPHY N/A 02/15/2023   Procedure: LEFT HEART CATH AND CORONARY ANGIOGRAPHY;  Surgeon: Iran Ouch, MD;  Location: [facility] INVASIVE CV LAB;  Service: Cardiovascular;  Laterality: N/A;   LEFT HEART CATH AND CORONARY ANGIOGRAPHY N/A 02/21/2023   Procedure: LEFT HEART CATH AND CORONARY ANGIOGRAPHY;  Surgeon: Yvonne Kendall, MD;  Location: [facility] INVASIVE CV LAB;  Service: Cardiovascular;  Laterality: N/A;   TONSILLECTOMY     TUBAL LIGATION      Current Medications: Current Facility-Administered Medications  Medication Dose Route Frequency Provider Last Rate Last Admin   acetaminophen (TYLENOL) tablet 650 mg  650 mg Oral Q6H PRN Jearld Lesch, NP   650 mg at 08/04/23 0148   alum & mag hydroxide-simeth (MAALOX/MYLANTA) 200-200-20 MG/5ML suspension 30 mL  30 mL Oral Q4H PRN Jearld Lesch, NP   30 mL at 08/22/23 1820   ARIPiprazole (ABILIFY) tablet 2 mg  2 mg Oral Daily Lewanda Rife, MD   2 mg at 08/27/23 1112   aspirin chewable tablet 81 mg  81 mg Oral Daily Dixon, Rashaun M, NP   81 mg at 08/27/23 1110   atorvastatin (LIPITOR) tablet 80 mg  80 mg Oral Daily  Lerry Liner M, NP   80 mg at 08/27/23 1109   carvedilol (COREG) tablet 12.5 mg  12.5 mg Oral BID WC Dixon, Rashaun M, NP   12.5 mg at 08/27/23 1110   haloperidol (HALDOL) tablet 5 mg  5 mg Oral TID PRN Jearld Lesch, NP       And   diphenhydrAMINE (BENADRYL) capsule 50 mg  50 mg Oral TID PRN Jearld Lesch, NP       haloperidol lactate (HALDOL) injection 5 mg  5 mg Intramuscular TID PRN Jearld Lesch, NP  And   diphenhydrAMINE (BENADRYL) injection 50 mg  50 mg Intramuscular TID PRN Jearld Lesch, NP       And   LORazepam (ATIVAN) injection 2 mg  2 mg Intramuscular TID PRN Jearld Lesch, NP       haloperidol lactate (HALDOL) injection 10 mg  10 mg Intramuscular TID PRN Jearld Lesch, NP       And   diphenhydrAMINE (BENADRYL) injection 50 mg  50 mg Intramuscular TID PRN Jearld Lesch, NP       And   LORazepam (ATIVAN) injection 2 mg  2 mg Intramuscular TID PRN Jearld Lesch, NP       feeding supplement (ENSURE ENLIVE / ENSURE PLUS) liquid 237 mL  237 mL Oral BID BM Massengill, Harrold Donath, MD   237 mL at 08/27/23 1450   hydrOXYzine (ATARAX) tablet 25 mg  25 mg Oral TID PRN Jearld Lesch, NP   25 mg at 08/26/23 0831   losartan (COZAAR) tablet 50 mg  50 mg Oral Daily Lerry Liner M, NP   50 mg at 08/27/23 1109   magnesium hydroxide (MILK OF MAGNESIA) suspension 30 mL  30 mL Oral Daily PRN Jearld Lesch, NP       multivitamin with minerals tablet 1 tablet  1 tablet Oral Daily Massengill, Nathan, MD   1 tablet at 08/27/23 1110   sertraline (ZOLOFT) tablet 100 mg  100 mg Oral Daily Lewanda Rife, MD   100 mg at 08/27/23 1110   ticagrelor (BRILINTA) tablet 90 mg  90 mg Oral BID Lerry Liner M, NP   90 mg at 08/27/23 1110   traZODone (DESYREL) tablet 50 mg  50 mg Oral QHS PRN Jearld Lesch, NP   50 mg at 08/26/23 2110    Lab Results:  No results found for this or any previous visit (from the past 48 hours).   Blood Alcohol level:  Lab Results   Component Value Date   ETH <10 08/02/2023   ETH <10 05/03/2023    Metabolic Disorder Labs: Lab Results  Component Value Date   HGBA1C 5.4 02/15/2023   MPG 108.28 02/15/2023   No results found for: "PROLACTIN" Lab Results  Component Value Date   CHOL 125 03/14/2023   TRIG 86 03/14/2023   HDL 52 03/14/2023   CHOLHDL 2.4 03/14/2023   VLDL 17 02/15/2023   LDLCALC 56 03/14/2023   LDLCALC 164 (H) 02/15/2023     Psychiatric Specialty Exam:  Presentation  General Appearance:  Casual  Eye Contact: Fair  Speech: More spontaneous  Speech Volume: Soft    Mood and Affect  Mood: "Good"  Affect: More animated   Thought Process  Thought Processes: Less thought blocking  Descriptions of Associations:Intact  Orientation:Partial  Thought Content:Illogical  Hallucinations:Denies  Ideas of Reference:None  Suicidal Thoughts:Denies  Homicidal Thoughts:Denies   Sensorium  Memory: Fair  Judgment: Limited  Insight: Limited   Executive Functions  Concentration: Improved  Attention Span: Poor  Language: Fair   Psychomotor Activity  Psychomotor Activity: Decreased  Musculoskeletal: Strength & Muscle Tone: within normal limits Gait & Station: normal Assets  Assets: Manufacturing systems engineer; Desire for Improvement; Physical Health    Physical Exam: Physical Exam Vitals and nursing note reviewed.  HENT:     Head: Normocephalic.     Nose: Nose normal.     Mouth/Throat:     Mouth: Mucous membranes are moist.  Eyes:     Pupils: Pupils are equal, round,  and reactive to light.  Cardiovascular:     Rate and Rhythm: Normal rate.  Pulmonary:     Breath sounds: Normal breath sounds.  Abdominal:     General: Bowel sounds are normal.  Skin:    General: Skin is warm.  Neurological:     General: No focal deficit present.     Mental Status: She is alert.    Review of Systems  Constitutional: Negative.   HENT: Negative.    Eyes: Negative.    Respiratory: Negative.    Cardiovascular: Negative.   Gastrointestinal: Negative.   Skin: Negative.   Neurological: Negative.    Blood pressure 137/79, pulse 67, temperature 97.8 F (36.6 C), resp. rate 16, height 5\' 7"  (1.702 m), weight 64.6 kg, SpO2 98%. Body mass index is 22.32 kg/m.  Diagnosis: Principal Problem:   MDD (major depressive disorder) Active Problems:   Hyperlipidemia   Chronic kidney disease, stage 3 (HCC)   Ischemic cardiomyopathy   Essential hypertension   Severe major depression without psychotic features (HCC)   Acute encephalopathy   PLAN: Safety and Monitoring:Patient signed voluntary 08/12/23  -- Involuntary admission to inpatient psychiatric unit for safety, stabilization and treatment  -- Daily contact with patient to assess and evaluate symptoms and progress in treatment  -- Patient's case to be discussed in multi-disciplinary team meeting  -- Observation Level : q15 minute checks  -- Vital signs:  q12 hours  -- Precautions: suicide, elopement, and assault -- Encouraged patient to participate in unit milieu and in scheduled group therapies   2. Psychiatric Diagnoses and Treatment:    Zoloft 100 mg once daily for depression               Continue 2 mg of Abilify to augment the effect of Zoloft, added on 08/24/23   3. 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 Chronic white matter disease and remote callosal, bilateral basal ganglia, thalami and right cerebellar infarcts.   IMPRESSION: 1. No evidence of dural venous sinus thrombosis. 2. 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 No further recommendations by the radiology Follow up with outpatient Neurologist, d/w the patient and his husband  4. Discharge Planning:   -- Social work and case management to  assist with discharge planning and identification of hospital follow-up needs prior to discharge  -- Discharge date to be determined, probably 2-3 days   Verner Chol, MD

## 2023-08-27 NOTE — Group Note (Signed)
Date:  08/27/2023 Time:  8:58 PM  Group Topic/Focus:  Overcoming Stress:   The focus of this group is to define stress and help patients assess their triggers.    Participation Level:  Active  Participation Quality:  Appropriate  Affect:  Appropriate  Cognitive:  Appropriate  Insight: Appropriate  Engagement in Group:  Engaged  Modes of Intervention:  Discussion  Additional Comments:    Garry Heater 08/27/2023, 8:58 PM

## 2023-08-27 NOTE — Plan of Care (Signed)
Problem: Education: Goal: Ability to make informed decisions regarding treatment will improve Outcome: Progressing   Problem: Coping: Goal: Coping ability will improve Outcome: Progressing

## 2023-08-27 NOTE — Telephone Encounter (Signed)
Please review.  KP

## 2023-08-27 NOTE — Progress Notes (Signed)
   08/27/23 1500  Psych Admission Type (Psych Patients Only)  Admission Status Voluntary  Psychosocial Assessment  Patient Complaints None  Eye Contact Fair  Facial Expression Flat  Affect Appropriate to circumstance  Speech Soft  Interaction Minimal  Motor Activity Slow  Appearance/Hygiene Unremarkable  Behavior Characteristics Cooperative  Mood Pleasant  Thought Process  Coherency WDL  Content WDL  Delusions None reported or observed  Perception WDL  Hallucination None reported or observed  Judgment Impaired  Confusion Mild  Danger to Self  Current suicidal ideation? Denies  Agreement Not to Harm Self Yes  Description of Agreement verbal  Danger to Others  Danger to Others None reported or observed

## 2023-08-28 DIAGNOSIS — F332 Major depressive disorder, recurrent severe without psychotic features: Secondary | ICD-10-CM | POA: Diagnosis not present

## 2023-08-28 MED ORDER — ENSURE ENLIVE PO LIQD: 237.0000 mL | Freq: Two times a day (BID) | ORAL | 12 refills | Status: AC

## 2023-08-28 MED ORDER — SERTRALINE HCL 100 MG PO TABS: 100.0000 mg | ORAL_TABLET | Freq: Every day | ORAL | 0 refills | Status: DC

## 2023-08-28 MED ORDER — ADULT MULTIVITAMIN W/MINERALS CH: 1.0000 | ORAL_TABLET | Freq: Every day | ORAL | 0 refills | Status: DC

## 2023-08-28 MED ORDER — ARIPIPRAZOLE 2 MG PO TABS: 2.0000 mg | ORAL_TABLET | Freq: Every day | ORAL | 0 refills | Status: DC

## 2023-08-28 NOTE — Progress Notes (Addendum)
   08/28/23 0134  Psych Admission Type (Psych Patients Only)  Admission Status Voluntary  Psychosocial Assessment  Patient Complaints Sadness  Eye Contact Fair  Facial Expression Sad  Affect Appropriate to circumstance  Speech Soft  Interaction Minimal  Motor Activity Slow  Appearance/Hygiene Unremarkable  Behavior Characteristics Cooperative;Appropriate to situation  Mood Sad  Thought Process  Coherency WDL  Content WDL  Delusions None reported or observed  Perception WDL  Hallucination None reported or observed  Judgment Impaired  Confusion None  Danger to Self  Current suicidal ideation? Denies  Agreement Not to Harm Self Yes  Description of Agreement verbal  Danger to Others  Danger to Others None reported or observed   Progress note   D: Pt seen in dayroom. Pt denies SI, HI, AVH. Pt rates pain  0/10. Pt endorses sadness today because she was not discharged. "I was expecting to be discharged and I wasn't. My daughter came to visit and she helped me to cry. Then I felt better. I want to go home and see my husband." Pt states the first thing she will do when she gets home is to take a bubble bath. Per shift report, pt is expected to discharge on Wednesday. No other concerns noted at this time.  A: Pt provided support and encouragement. Pt given scheduled medication as prescribed. PRNs as appropriate. Q15 min checks for safety.   R: Pt safe on the unit. Will continue to monitor.

## 2023-08-28 NOTE — Progress Notes (Signed)

## 2023-08-28 NOTE — BHH Suicide Risk Assessment (Signed)
[facility] Discharge Suicide Risk Assessment   Principal Problem: MDD (major depressive disorder) Discharge Diagnoses: Principal Problem:   MDD (major depressive disorder) Active Problems:   Hyperlipidemia   Chronic kidney disease, stage 3 (HCC)   Ischemic cardiomyopathy   Essential hypertension   Severe major depression without psychotic features (HCC)   Acute encephalopathy   Total Time spent with patient: 30 minutes  Musculoskeletal: Strength & Muscle Tone: within normal limits Gait & Station: normal Patient leans: N/A  Psychiatric Specialty Exam  Presentation  General Appearance:  Appropriate for Environment; Casual  Eye Contact: Fair  Speech: Clear and Coherent  Speech Volume: Normal  Handedness: Right   Mood and Affect  Mood: Euthymic  Duration of Depression Symptoms: Greater than two weeks  Affect: Appropriate   Thought Process  Thought Processes: Linear  Descriptions of Associations:Intact  Orientation:Partial  Thought Content:Illogical  History of Schizophrenia/Schizoaffective disorder:No  Duration of Psychotic Symptoms:No data recorded Hallucinations:Hallucinations: None  Ideas of Reference:None  Suicidal Thoughts:Suicidal Thoughts: No  Homicidal Thoughts:Homicidal Thoughts: No   Sensorium  Memory: Immediate Fair; Recent Poor; Remote Poor  Judgment: Impaired  Insight: Shallow   Executive Functions  Concentration: Poor  Attention Span: Poor  Recall: Poor  Fund of Knowledge: Fair  Language: Fair   Psychomotor Activity  Psychomotor Activity: Psychomotor Activity: Normal   Assets  Assets: Communication Skills; Desire for Improvement; Physical Health   Sleep  Sleep: Sleep: Fair   Physical Exam: Physical Exam ROS Blood pressure 122/72, pulse 66, temperature 98.5 F (36.9 C), resp. rate 18, height 5\' 7"  (1.702 m), weight 64.6 kg, SpO2 97%. Body mass index is 22.32 kg/m.  Mental Status Per Nursing  Assessment::   On Admission:  NA  Demographic Factors:  Age 26 or older and Caucasian  Loss Factors: Decline in physical health  Historical Factors: NA  Risk Reduction Factors:   Living with another person, especially a relative, Positive social support, Positive therapeutic relationship, and Positive coping skills or problem solving skills  Continued Clinical Symptoms:  Previous Psychiatric Diagnoses and Treatments  Cognitive Features That Contribute To Risk:  Thought constriction (tunnel vision)    Suicide Risk:  Minimal: No identifiable suicidal ideation.  Patients presenting with no risk factors but with morbid ruminations; may be classified as minimal risk based on the severity of the depressive symptoms   Follow-up Information     [facility] Go on 09/02/2023.   Why: Your appointment is scheduled for 09/02/23. Please remember to bring your updated insurance card.                Plan Of Care/Follow-up recommendations:  Activity:  As tolerated  Verner Chol, MD 08/28/2023, 10:34 AM

## 2023-08-28 NOTE — Progress Notes (Signed)
  [facility] Adult Case Management Discharge Plan :  Will you be returning to the same living situation after discharge:  Yes,  pt will return home  At discharge, do you have transportation home?: Yes,  pt's husband will pick her up in the afternnon  Do you have the ability to pay for your medications: Yes,  [employer] MEDICARE / [facility] MEDICARE  Release of information consent forms completed and in the chart;  Patient's signature needed at discharge.  Patient to Follow up at:  Follow-up Information     [facility]. Go on 09/02/2023.   Why: Your appointment is scheduled for 09/02/23. Please remember to bring your updated insurance card.                Next level of care provider has access to [facility] Link:yes  Safety Planning and Suicide Prevention discussed: Yes,  Brae Schaafsma, [phone]     Has patient been referred to the Quitline?: Patient does not use tobacco/nicotine products  Patient has been referred for addiction treatment: No known substance use disorder.  [address], LCSWA 08/28/2023, 10:08 AM

## 2023-08-28 NOTE — Plan of Care (Signed)
Problem: Coping: Goal: Coping ability will improve Outcome: Progressing   Problem: Health Behavior/Discharge Planning: Goal: Identification of resources available to assist in meeting health care needs will improve Outcome: Progressing   Problem: Medication: Goal: Compliance with prescribed medication regimen will improve Outcome: Progressing   Problem: Self-Concept: Goal: Will verbalize positive feelings about self Outcome: Progressing

## 2023-08-28 NOTE — Progress Notes (Signed)
   08/28/23 0740  Psych Admission Type (Psych Patients Only)  Admission Status Voluntary  Psychosocial Assessment  Patient Complaints None  Eye Contact Poor  Facial Expression Flat  Affect Appropriate to circumstance  Speech Soft  Interaction Minimal  Motor Activity Slow  Appearance/Hygiene Unremarkable  Behavior Characteristics Cooperative  Mood Sullen  Thought Process  Coherency WDL  Content WDL  Delusions None reported or observed  Perception WDL  Hallucination None reported or observed  Judgment Impaired  Confusion None  Danger to Self  Current suicidal ideation? Denies  Danger to Others  Danger to Others None reported or observed

## 2023-08-28 NOTE — Progress Notes (Signed)
   08/28/23 0600  15 Minute Checks  Location Bathroom/Shower  Visual Appearance Calm  Behavior Composed  Sleep (Behavioral Health Patients Only)  Calculate sleep? (Click Yes once per 24 hr at 0600 safety check) Yes  Documented sleep last 24 hours 7

## 2023-08-28 NOTE — Care Management Important Message (Signed)
Important Message  Patient Details  Name: Karina Freeman MRN: [id number] Date of Birth: 07-Jun-1951   Important Message Given:  Yes - Medicare IM     [name], LCSWA 08/28/2023, 10:11 AM

## 2023-08-28 NOTE — Discharge Summary (Signed)
Physician Discharge Summary Note  Patient:  Karina Freeman is an 73 y.o., female MRN:  [id number] DOB:  [birth year] Patient phone:  [phone] (home)  Patient address:   [address] [name] [address],    Date of Admission:  08/03/2023 Date of Discharge: 08/28/23  Reason for Admission:  Patient is a 73 year old female presenting to [facility] ED under IVC. Per triage note Pt husband and daughter left pt at home to go run errands and found that pt right ear was bleeding when they returned home and found a wire clothes hanger on the bathroom floor.  Patient was evaluated in the emergency room and admitted to [facility] due to psych unit.  Patient was admitted to the unit in October 2020 for for depression and suicide attempt by overdose.   Principal Problem: MDD (major depressive disorder) Discharge Diagnoses: Principal Problem:   MDD (major depressive disorder) Active Problems:   Hyperlipidemia   Chronic kidney disease, stage 3 (HCC)   Ischemic cardiomyopathy   Essential hypertension   Severe major depression without psychotic features (HCC)   Acute encephalopathy   Past Psychiatric History: see h&P  Family Psychiatric  History: see h&p Social History:  Social History   Substance and Sexual Activity  Alcohol Use No   Alcohol/week: 0.0 standard drinks of alcohol     Social History   Substance and Sexual Activity  Drug Use No    Social History   Socioeconomic History   Marital status: Married    Spouse name: [name]   Number of children: 3   Years of education: Not on file   Highest education level: Not on file  Occupational History   Not on file  Tobacco Use   Smoking status: Former    Current packs/day: 0.00    Types: Cigarettes    Quit date: 07/16/1973    Years since quitting: 50.1   Smokeless tobacco: Never  Vaping Use   Vaping status: Never Used  Substance and Sexual Activity   Alcohol use: No    Alcohol/week: 0.0 standard drinks of alcohol   Drug use: No    Sexual activity: Yes  Other Topics Concern   Not on file  Social History Narrative   Not on file   Social Drivers of Health   Financial Resource Strain: Not on file  Food Insecurity: No Food Insecurity (08/03/2023)   Hunger Vital Sign    Worried About Running Out of Food in the Last Year: Never true    Ran Out of Food in the Last Year: Never true  Transportation Needs: No Transportation Needs (08/03/2023)   PRAPARE - Administrator, Civil Service (Medical): No    Lack of Transportation (Non-Medical): No  Physical Activity: Not on file  Stress: Not on file  Social Connections: Moderately Integrated (08/03/2023)   Social Connection and Isolation Panel [NHANES]    Frequency of Communication with Friends and Family: Once a week    Frequency of Social Gatherings with Friends and Family: Once a week    Attends Religious Services: 1 to 4 times per year    Active Member of [employer] or Organizations: Yes    Attends Banker Meetings: 1 to 4 times per year    Marital Status: Married   Past Medical History:  Past Medical History:  Diagnosis Date   Abnormal Pap smear of cervix 11/11/2015   Acute HFrEF (heart failure with reduced ejection fraction) (HCC) 02/22/2023   Acute ST elevation  myocardial infarction (STEMI) of anterior wall (HCC) 02/15/2023   Anxiety    CVA (cerebral infarction)    Depressive disorder    GERD (gastroesophageal reflux disease)    Hyperlipidemia    Hypertension    IFG (impaired fasting glucose)    Osteoporosis    ST elevation myocardial infarction (STEMI) (HCC) 02/17/2023    Past Surgical History:  Procedure Laterality Date   APPENDECTOMY     CORONARY IMAGING/OCT N/A 02/21/2023   Procedure: CORONARY IMAGING/OCT;  Surgeon: Yvonne Kendall, MD;  Location: [facility] INVASIVE CV LAB;  Service: Cardiovascular;  Laterality: N/A;   CORONARY PRESSURE/FFR STUDY N/A 02/21/2023   Procedure: CORONARY PRESSURE/FFR STUDY;  Surgeon: Yvonne Kendall, MD;   Location: [facility] INVASIVE CV LAB;  Service: Cardiovascular;  Laterality: N/A;   CORONARY STENT INTERVENTION N/A 02/21/2023   Procedure: CORONARY STENT INTERVENTION;  Surgeon: Yvonne Kendall, MD;  Location: [facility] INVASIVE CV LAB;  Service: Cardiovascular;  Laterality: N/A;   CORONARY/GRAFT ACUTE MI REVASCULARIZATION N/A 02/15/2023   Procedure: Coronary/Graft Acute MI Revascularization;  Surgeon: Iran Ouch, MD;  Location: [facility] INVASIVE CV LAB;  Service: Cardiovascular;  Laterality: N/A;   LEFT HEART CATH AND CORONARY ANGIOGRAPHY N/A 02/15/2023   Procedure: LEFT HEART CATH AND CORONARY ANGIOGRAPHY;  Surgeon: Iran Ouch, MD;  Location: [facility] INVASIVE CV LAB;  Service: Cardiovascular;  Laterality: N/A;   LEFT HEART CATH AND CORONARY ANGIOGRAPHY N/A 02/21/2023   Procedure: LEFT HEART CATH AND CORONARY ANGIOGRAPHY;  Surgeon: Yvonne Kendall, MD;  Location: [facility] INVASIVE CV LAB;  Service: Cardiovascular;  Laterality: N/A;   TONSILLECTOMY     TUBAL LIGATION     Family History:  Family History  Problem Relation Age of Onset   Heart disease Mother    Cancer Mother        breast   Heart disease Father    Heart disease Brother    Bipolar disorder Brother    Heart disease Sister    Breast cancer Neg Hx     Hospital Course:  Patient is a 73 year old female presenting to [facility] ED under IVC. Per triage note Pt husband and daughter left pt at home to go run errands and found that pt right ear was bleeding when they returned home and found a wire clothes hanger on the bathroom floor.  Patient was evaluated in the emergency room and admitted to [facility] due to psych unit.  Patient was admitted to the unit in October 2020 for for depression and suicide attempt by overdose.  Patient was admitted to the geropsych unit.  Multidisciplinary team approach was offered.  Medication management, milieu/group therapy was offered.  Patient's husband and daughter were engaged in the treatment plan.  Patient was started back  on her home medicine Zoloft 50 mg daily.  Zoloft was titrated up to 75 mg and then 200 mg by the time of discharge.  As patient reported some anxiety patient was started on Klonopin 0.5 mg nightly.  Klonopin was discontinued as per the hospitalist recommendation due to possible encephalopathy.  Patient was started on Abilify 2 mg nightly to further augment the antidepressant effects of Zoloft.  Few days after hospitalization patient mental status declined, became bedbound, refusing to come out of the room or eat.  Hospitalist consulted for altered mental status.  The following are the recommendations;Acute encephalopathy Mild generalized lethargy and decreased mood over multiple days Nonfocal neuroexam on evaluation Suspect multifactorial with strong contribution of baseline psychiatric disease Noted recent regular dosing of benzodiazepine  which may be confounding issue Appears fairly euvolemic Rule out toxic metabolic and infectious etiologies CT headFINDINGS: Brain: No evidence of acute infarction, hemorrhage, hydrocephalus, extra-axial collection or mass lesion/mass effect. Extensive low-density in the cerebral white matter attributed to chronic small vessel ischemia. Chronic perforator infarct at the left basal ganglia. Chronic lacunar infarcts in the bilateral deep gray nuclei. Patchy chronic infarction in the right cerebellum as well. Mild for age cerebral volume loss. Chest x-ray, urinalysis CBC and CMP Ammonia level 18 Discussed with psychiatry attending-consider dcing Klonopin Otherwise monitor  On the day of discharge patient consistently denied SI/HI/intent/plan.  She denies auditory/visual hallucinations.  She reports being excited about going home.  She denies feeling depressed or anxious.  Given the CT scan findings, her cognitive impairment especially significant memory problems patient and family is recommended to follow-up with outpatient neurology to be evaluated for major  neuro cognitive disorder   Psychiatric Specialty Exam:  Presentation  General Appearance:  Appropriate for Environment; Casual  Eye Contact: Fair  Speech: Normal Rate  Speech Volume: Normal    Mood and Affect  Mood: Euthymic  Affect: Appropriate   Thought Process  Thought Processes: Coherent  Descriptions of Associations:Intact  Orientation:Partial  Thought Content:Logical  Hallucinations:Hallucinations: None  Ideas of Reference:None  Suicidal Thoughts:Suicidal Thoughts: No  Homicidal Thoughts:Homicidal Thoughts: No   Sensorium  Memory: Immediate Fair; Recent Fair; Remote Fair  Judgment: Fair  Insight: Fair   Art therapist  Concentration: Fair  Attention Span: Fair  Recall: Poor  Fund of Knowledge: Poor  Language: Fair   Psychomotor Activity  Psychomotor Activity: Psychomotor Activity: Normal  Musculoskeletal: Strength & Muscle Tone: within normal limits Gait & Station: normal Assets  Assets: Manufacturing systems engineer; Desire for Improvement; Resilience   Sleep  Sleep: Sleep: Fair    Physical Exam: Physical Exam Vitals and nursing note reviewed.  HENT:     Head: Normocephalic.     Nose: Nose normal.     Mouth/Throat:     Mouth: Mucous membranes are moist.  Eyes:     Pupils: Pupils are equal, round, and reactive to light.  Cardiovascular:     Rate and Rhythm: Normal rate.  Pulmonary:     Breath sounds: Normal breath sounds.  Abdominal:     General: Bowel sounds are normal.  Skin:    General: Skin is warm.  Neurological:     General: No focal deficit present.     Mental Status: She is alert.    Review of Systems  Constitutional: Negative.   HENT: Negative.    Eyes: Negative.   Respiratory: Negative.    Cardiovascular: Negative.   Gastrointestinal: Negative.   Skin: Negative.    Blood pressure 122/72, pulse 66, temperature 98.5 F (36.9 C), resp. rate 18, height 5\' 7"  (1.702 m), weight 64.6 kg,  SpO2 97%. Body mass index is 22.32 kg/m.   Social History   Tobacco Use  Smoking Status Former   Current packs/day: 0.00   Types: Cigarettes   Quit date: 07/16/1973   Years since quitting: 50.1  Smokeless Tobacco Never   Tobacco Cessation:  N/A, patient does not currently use tobacco products   Blood Alcohol level:  Lab Results  Component Value Date   ETH <10 08/02/2023   ETH <10 05/03/2023    Metabolic Disorder Labs:  Lab Results  Component Value Date   HGBA1C 5.4 02/15/2023   MPG 108.28 02/15/2023   No results found for: "PROLACTIN" Lab Results  Component Value Date  CHOL 125 03/14/2023   TRIG 86 03/14/2023   HDL 52 03/14/2023   CHOLHDL 2.4 03/14/2023   VLDL 17 02/15/2023   LDLCALC 56 03/14/2023   LDLCALC 164 (H) 02/15/2023    See Psychiatric Specialty Exam and Suicide Risk Assessment completed by Attending Physician prior to discharge.  Discharge destination:  Home  Is patient on multiple antipsychotic therapies at discharge:  No   Has Patient had three or more failed trials of antipsychotic monotherapy by history:  No  Recommended Plan for Multiple Antipsychotic Therapies: NA  Discharge Instructions     Diet - low sodium heart healthy   Complete by: As directed    Increase activity slowly   Complete by: As directed         Follow-up Information     [facility]. Go on 09/02/2023.   Why: Your appointment is scheduled for 09/02/23. Please remember to bring your updated insurance card.                Follow-up recommendations:  Activity:  as tolerated    Signed: Verner Chol, MD 08/28/2023, 10:35 AM

## 2023-08-29 ENCOUNTER — Ambulatory Visit: Payer: Medicare Other | Admitting: Psychiatry

## 2023-08-29 ENCOUNTER — Telehealth: Payer: Self-pay

## 2023-08-29 NOTE — Transitions of Care (Post Inpatient/ED Visit) (Signed)
   08/29/2023  Name: Karina Freeman MRN: [id number] DOB: [birth year]  Today's TOC FU Call Status: Today's TOC FU Call Status:: Unsuccessful Call (1st Attempt) Unsuccessful Call (1st Attempt) Date: 08/29/23  Attempted to reach the patient regarding the most recent Inpatient/ED visit.Patient was called in an Outreach attempt to offer [facility]  30-day TOC program. Pt is eligible for program due to potential risk for readmission and/or high utilization. Unfortunately, I was not able to speak with the patient in regards to recent hospital discharge  Spoke to a gentleman who stated "it is not a good time " When  asked if I could call back later he repeated " It's not a good time"and hung up. . Patient was previously followed by [facility] for a  prior hospitalization 04/2023   Follow Up Plan: Additional outreach attempts will be made to reach the patient to complete the Transitions of Care (Post Inpatient/ED visit) call.   Susa Loffler , BSN, RN [facility]   [facility]-Population Health RN Care Manager Direct Dial [phone]  Fax: [phone] Website: [email or web address]

## 2023-08-30 ENCOUNTER — Telehealth: Payer: Self-pay

## 2023-08-30 NOTE — Transitions of Care (Post Inpatient/ED Visit) (Signed)
08/30/2023  Name: Karina Freeman MRN: [id number] DOB: 11-06-1950  Today's TOC FU Call Status: Today's TOC FU Call Status:: Successful TOC FU Call Completed Unsuccessful Call (1st Attempt) Date: 08/29/23 [facility] FU Call Complete Date: 08/30/23 Patient's Name and Date of Birth confirmed.  Transition Care Management Follow-up Telephone Call Date of Discharge: 08/28/23 Discharge Facility: [facility]) Name of Other (Non-[facility]) Discharge Facility: Behavioral [facility] Type of Discharge: Inpatient Admission Primary Inpatient Discharge Diagnosis:: MDD (major depressive disorder) How have you been since you were released from the hospital?: Better Any questions or concerns?: No  Items Reviewed: Did you receive and understand the discharge instructions provided?: Yes Medications obtained,verified, and reconciled?: Yes (Medications Reviewed) Any new allergies since your discharge?: No Dietary orders reviewed?: Yes Type of Diet Ordered:: Reg Heart Healthy Do you have support at home?: Yes People in Home: spouse, child(ren), adult Name of Support/Comfort Primary Source: [name] [name]  Medications Reviewed Today: Medications Reviewed Today     Reviewed by Johnnette Barrios, RN (Registered Nurse) on 08/30/23 at [phone]  Med List Status: <None>   Medication Order Taking? Sig Documenting Provider Last Dose Status Informant  amLODipine (NORVASC) 5 MG tablet [id number]  Take 1 tablet (5 mg total) by mouth at bedtime. Reather Littler D, NP  Expired 08/03/23 2359 Spouse/Significant Other, Pharmacy Records  ARIPiprazole (ABILIFY) 2 MG tablet [id number] Yes Take 1 tablet (2 mg total) by mouth daily. Verner Chol, MD Taking Active            Med Note Sharon Seller, Cressie Betzler L   Fri Aug 30, 2023  9:42 AM) New medication  aspirin 81 MG chewable tablet [id number] Yes Chew 1 tablet (81 mg total) by mouth daily. Reather Littler D, NP Taking Active Spouse/Significant Other, Pharmacy  Records  atorvastatin (LIPITOR) 80 MG tablet [id number] Yes Take 1 tablet (80 mg total) by mouth daily. Reather Littler D, NP Taking Active Spouse/Significant Other, Pharmacy Records  carvedilol (COREG) 12.5 MG tablet [id number]  Take 1 tablet (12.5 mg total) by mouth 2 (two) times daily with a meal. Reather Littler D, NP  Expired 08/03/23 2359 Spouse/Significant Other, Pharmacy Records           Med Note Sharia Reeve   ZDG Aug 03, 2023  8:44 AM)    feeding supplement (ENSURE ENLIVE / ENSURE PLUS) LIQD [id number] Yes Take 237 mLs by mouth 2 (two) times daily between meals. Verner Chol, MD Taking Active   losartan (COZAAR) 50 MG tablet [id number] Yes Take 1 tablet (50 mg total) by mouth daily. Reubin Milan, MD Taking Active Spouse/Significant Other, Pharmacy Records  Multiple Vitamin (MULTIVITAMIN WITH MINERALS) TABS tablet [id number] Yes Take 1 tablet by mouth daily. Verner Chol, MD Taking Active   nitroGLYCERIN (NITROSTAT) 0.4 MG SL tablet [id number] Yes Place 1 tablet (0.4 mg total) under the tongue every 5 (five) minutes as needed for chest pain. Ernestene Mention, MD Taking Active Spouse/Significant Other, Pharmacy Records           Med Note Sharia Reeve   KGU Aug 03, 2023  8:45 AM) prn  sertraline (ZOLOFT) 100 MG tablet [id number] Yes Take 1 tablet (100 mg total) by mouth daily. Verner Chol, MD Taking Active   ticagrelor (BRILINTA) 90 MG TABS tablet [id number] Yes Take 1 tablet (90 mg total) by mouth 2 (two) times daily. Reubin Milan, MD Taking Active Spouse/Significant Other, Pharmacy Records  Home Care and Equipment/Supplies: Were Home Health Services Ordered?: No Any new equipment or medical supplies ordered?: No  Functional Questionnaire: Do you need assistance with bathing/showering or dressing?: No Do you need assistance with meal preparation?: No Do you need assistance with eating?: No Do you have difficulty maintaining continence: No Do you  need assistance with getting out of bed/getting out of a chair/moving?: No Do you have difficulty managing or taking your medications?: No  Follow up appointments reviewed: PCP Follow-up appointment confirmed?: Yes Date of PCP follow-up appointment?: 09/25/23 (They will try to get 1 sooner) Follow-up Provider: Bari Edward Specialist [facility] Follow-up appointment confirmed?: Yes Date of Specialist follow-up appointment?: 09/02/23 Follow-Up Specialty Provider:: [facility] Do you need transportation to your follow-up appointment?: No (spouse drives) Do you understand care options if your condition(s) worsen?: Yes-patient verbalized understanding  SDOH Interventions Today    Flowsheet Row Most Recent Value  SDOH Interventions   Food Insecurity Interventions Intervention Not Indicated  Housing Interventions Intervention Not Indicated  Transportation Interventions Intervention Not Indicated, Patient Resources (Friends/Family)  Utilities Interventions Intervention Not Indicated      Interventions Today    Flowsheet Row Most Recent Value  Chronic Disease   Chronic disease during today's visit Other  [Depression]  General Interventions   General Interventions Discussed/Reviewed General Interventions Discussed, General Interventions Reviewed, Communication with, Doctor Visits, Community Resources  Doctor Visits Discussed/Reviewed Doctor Visits Discussed, Doctor Visits Reviewed, PCP, Specialist  PCP/Specialist Visits Compliance with follow-up visit  Exercise Interventions   Exercise Discussed/Reviewed Exercise Discussed, Physical Activity  Physical Activity Discussed/Reviewed Physical Activity Reviewed  Mental Health Interventions   Mental Health Discussed/Reviewed Mental Health Discussed, Mental Health Reviewed, Coping Strategies, Depression, Anxiety, Suicide  Nutrition Interventions   Nutrition Discussed/Reviewed Nutrition Discussed, Nutrition Reviewed, Supplemental nutrition   Pharmacy Interventions   Pharmacy Dicussed/Reviewed Medications and their functions, Pharmacy Topics Reviewed  Safety Interventions   Safety Discussed/Reviewed Safety Reviewed        Benefits reviewed  Based on current information and Insurance plan -Reviewed benefits accessible to patient, including details about eligibility options for care and  available value based care options  if any areas of needs were identified.  Reviewed patient/  caregiver's ability to access and / or  ability with navigating the benefits system..Amb Referral made if indicted , refer to orders section of note for details   Reviewed goals for care Patient/ Caregiver  verbalizes understanding of instructions and care plan provided. Patient / Caregiver was encouraged to make informed decisions about their care, actively participate in managing their health condition, and implement lifestyle changes as needed to promote independence and self-management of health care. There were no reported  barriers to care.   TOC program  Patient is at high risk for readmission and/or has history of  high utilization  Discussed [facility]  TOC program and weekly calls to patient to assess condition/status, medication management  and provide support/education as indicated . Patient/ Caregiver voiced understanding and declined enrollment in the 30-day TOC Program.   She is known to this CM, Spoke with patient and  her spouse. She is doing well She started her Ambilify She has an appointment with [facility] Monday. She declined need need for additional follow-up.     The patient has been provided with contact information for the care management team and has been advised to call with any health-related questions or concerns. Follow up as indicated with Care Team , or sooner should any new problems arise.  Susa Loffler , BSN, RN [facility]   VBCI-Population Health RN Care Manager Direct Dial [phone]  Fax:  [phone] Website: [email or web address]

## 2023-09-02 ENCOUNTER — Ambulatory Visit: Payer: Medicare Other | Admitting: Psychiatry

## 2023-09-25 ENCOUNTER — Ambulatory Visit (INDEPENDENT_AMBULATORY_CARE_PROVIDER_SITE_OTHER): Payer: Medicare Other | Admitting: Internal Medicine

## 2023-09-25 ENCOUNTER — Encounter: Payer: Self-pay | Admitting: Internal Medicine

## 2023-09-25 VITALS — BP 118/68 | HR 65 | Ht 67.0 in | Wt 151.4 lb

## 2023-09-25 DIAGNOSIS — I10 Essential (primary) hypertension: Secondary | ICD-10-CM | POA: Diagnosis not present

## 2023-09-25 DIAGNOSIS — Z1211 Encounter for screening for malignant neoplasm of colon: Secondary | ICD-10-CM | POA: Diagnosis not present

## 2023-09-25 DIAGNOSIS — I5032 Chronic diastolic (congestive) heart failure: Secondary | ICD-10-CM | POA: Diagnosis not present

## 2023-09-25 DIAGNOSIS — R9082 White matter disease, unspecified: Secondary | ICD-10-CM | POA: Diagnosis not present

## 2023-09-25 DIAGNOSIS — F322 Major depressive disorder, single episode, severe without psychotic features: Secondary | ICD-10-CM | POA: Diagnosis not present

## 2023-09-25 MED ORDER — ARIPIPRAZOLE 2 MG PO TABS: 2.0000 mg | ORAL_TABLET | Freq: Every day | ORAL | 0 refills | Status: DC

## 2023-09-25 MED ORDER — SERTRALINE HCL 100 MG PO TABS: 100.0000 mg | ORAL_TABLET | Freq: Every day | ORAL | 0 refills | Status: DC

## 2023-09-25 NOTE — Assessment & Plan Note (Signed)
 Controlled BP with normal exam. Current regimen is Coreg, amlodipine and losartan. Will continue same medications; encourage continued reduced sodium diet.

## 2023-09-25 NOTE — Assessment & Plan Note (Signed)
 She is doing much better on Sertraline 100 mg and low dose Abilify Will check labs Reminded her to keep appointment with Psych at the end of this month Refills for 30 days provided

## 2023-09-25 NOTE — Assessment & Plan Note (Addendum)
 She appears stable and euvolemic today. BP is well controlled. Recommend Cardiology follow up - continue Coreg and amlodipine, plus statin and Brilinta

## 2023-09-25 NOTE — Progress Notes (Signed)
 Date:  09/25/2023   Name:  Karina Freeman   DOB:  1950-12-14   MRN:  [id number]   Chief Complaint: Hypertension  Hypertension This is a chronic problem. The problem is controlled. Pertinent negatives include no chest pain, headaches, palpitations or shortness of breath. Past treatments include beta blockers.  Depression        Associated symptoms include no fatigue, no appetite change and no headaches.   Review of Systems  Constitutional:  Positive for unexpected weight change (has gained a few pounds). Negative for appetite change, chills, fatigue and fever.  HENT:  Negative for trouble swallowing.   Respiratory:  Negative for chest tightness, shortness of breath and wheezing.   Cardiovascular:  Negative for chest pain, palpitations and leg swelling.  Gastrointestinal:  Negative for abdominal pain.  Neurological:  Negative for dizziness and headaches.  Psychiatric/Behavioral:  Positive for depression. Negative for dysphoric mood, hallucinations and sleep disturbance. The patient is not nervous/anxious.      Lab Results  Component Value Date   NA 139 08/17/2023   K 3.9 08/17/2023   CO2 26 08/17/2023   GLUCOSE 94 08/17/2023   BUN 24 (H) 08/17/2023   CREATININE 0.99 08/17/2023   CALCIUM 8.4 (L) 08/17/2023   EGFR 53 (L) 03/14/2023   GFRNONAA >60 08/17/2023   Lab Results  Component Value Date   CHOL 125 03/14/2023   HDL 52 03/14/2023   LDLCALC 56 03/14/2023   TRIG 86 03/14/2023   CHOLHDL 2.4 03/14/2023   Lab Results  Component Value Date   TSH 1.050 03/26/2023   Lab Results  Component Value Date   HGBA1C 5.4 02/15/2023   Lab Results  Component Value Date   WBC 7.2 08/17/2023   HGB 10.7 (L) 08/17/2023   HCT 32.4 (L) 08/17/2023   MCV 95.6 08/17/2023   PLT 223 08/17/2023   Lab Results  Component Value Date   ALT 22 08/17/2023   AST 23 08/17/2023   ALKPHOS 45 08/17/2023   BILITOT 0.7 08/17/2023   No results found for: "25OHVITD2", "25OHVITD3", "VD25OH"    Patient Active Problem List   Diagnosis Date Noted   Heart failure with improved ejection fraction (HFimpEF) (HCC) 09/25/2023   White matter abnormality on MRI of brain 09/25/2023   Suicidal ideation 05/04/2023   Anxiety state 05/04/2023   Acute confusional state 05/04/2023   Insomnia 03/26/2023   Essential hypertension 02/21/2023   Coronary artery disease involving native coronary artery of native heart with unstable angina pectoris (HCC) 02/21/2023   Ischemic cardiomyopathy 02/16/2023   Chronic kidney disease, stage 3 (HCC) 06/25/2016   History of stroke 02/10/2015   Osteoporosis 02/10/2015   Hyperlipidemia 02/10/2015   GERD (gastroesophageal reflux disease) 02/10/2015   Toenail fungus 02/10/2015   Severe major depression without psychotic features (HCC) 02/10/2015    Allergies  Allergen Reactions   Citalopram Hydrobromide Nausea And Vomiting   Codeine Diarrhea and Nausea And Vomiting   Penicillin G Benzathine     Past Surgical History:  Procedure Laterality Date   APPENDECTOMY     CORONARY IMAGING/OCT N/A 02/21/2023   Procedure: CORONARY IMAGING/OCT;  Surgeon: Yvonne Kendall, MD;  Location: [facility];  Service: Cardiovascular;  Laterality: N/A;   CORONARY PRESSURE/FFR STUDY N/A 02/21/2023   Procedure: CORONARY PRESSURE/FFR STUDY;  Surgeon: Yvonne Kendall, MD;  Location: [facility];  Service: Cardiovascular;  Laterality: N/A;   CORONARY STENT INTERVENTION N/A 02/21/2023   Procedure: CORONARY STENT INTERVENTION;  Surgeon: End,  Cristal Deer, MD;  Location: [facility];  Service: Cardiovascular;  Laterality: N/A;   CORONARY/GRAFT ACUTE MI REVASCULARIZATION N/A 02/15/2023   Procedure: Coronary/Graft Acute MI Revascularization;  Surgeon: Iran Ouch, MD;  Location: [facility];  Service: Cardiovascular;  Laterality: N/A;   LEFT HEART CATH AND CORONARY ANGIOGRAPHY N/A 02/15/2023   Procedure: LEFT HEART CATH AND CORONARY ANGIOGRAPHY;   Surgeon: Iran Ouch, MD;  Location: [facility];  Service: Cardiovascular;  Laterality: N/A;   LEFT HEART CATH AND CORONARY ANGIOGRAPHY N/A 02/21/2023   Procedure: LEFT HEART CATH AND CORONARY ANGIOGRAPHY;  Surgeon: Yvonne Kendall, MD;  Location: [facility];  Service: Cardiovascular;  Laterality: N/A;   TONSILLECTOMY     TUBAL LIGATION      Social History   Tobacco Use   Smoking status: Former    Current packs/day: 0.00    Types: Cigarettes    Quit date: 07/16/1973    Years since quitting: 50.2   Smokeless tobacco: Never  Vaping Use   Vaping status: Never Used  Substance Use Topics   Alcohol use: No    Alcohol/week: 0.0 standard drinks of alcohol   Drug use: No     Medication list has been reviewed and updated.  Current Meds  Medication Sig   amLODipine (NORVASC) 5 MG tablet Take 1 tablet (5 mg total) by mouth at bedtime.   ARIPiprazole (ABILIFY) 2 MG tablet Take 1 tablet (2 mg total) by mouth daily.   aspirin 81 MG chewable tablet Chew 1 tablet (81 mg total) by mouth daily.   atorvastatin (LIPITOR) 80 MG tablet Take 1 tablet (80 mg total) by mouth daily.   carvedilol (COREG) 12.5 MG tablet Take 1 tablet (12.5 mg total) by mouth 2 (two) times daily with a meal.   feeding supplement (ENSURE ENLIVE / ENSURE PLUS) LIQD Take 237 mLs by mouth 2 (two) times daily between meals.   losartan (COZAAR) 50 MG tablet Take 1 tablet (50 mg total) by mouth daily.   nitroGLYCERIN (NITROSTAT) 0.4 MG SL tablet Place 1 tablet (0.4 mg total) under the tongue every 5 (five) minutes as needed for chest pain.   sertraline (ZOLOFT) 100 MG tablet Take 1 tablet (100 mg total) by mouth daily.   ticagrelor (BRILINTA) 90 MG TABS tablet Take 1 tablet (90 mg total) by mouth 2 (two) times daily.   [DISCONTINUED] ARIPiprazole (ABILIFY) 2 MG tablet Take 1 tablet (2 mg total) by mouth daily.   [DISCONTINUED] sertraline (ZOLOFT) 100 MG tablet Take 1 tablet (100 mg total) by mouth daily.        09/25/2023    3:13 PM 05/28/2023    3:20 PM 03/26/2023    1:59 PM  GAD 7 : Generalized Anxiety Score  Nervous, Anxious, on Edge 0 0 0  Control/stop worrying 0 0 1  Worry too much - different things 0 0 1  Trouble relaxing 0 0 0  Restless 1 0 1  Easily annoyed or irritable 0 0 1  Afraid - awful might happen 0 0 0  Total GAD 7 Score 1 0 4  Anxiety Difficulty Not difficult at all Not difficult at all Not difficult at all       09/25/2023    3:13 PM 05/28/2023    3:20 PM 04/04/2023    3:55 PM  Depression screen PHQ 2/9  Decreased Interest 0 0 0  Down, Depressed, Hopeless 0 0 0  PHQ - 2 Score 0 0  0  Altered sleeping 0 0 1  Tired, decreased energy 1 0 1  Change in appetite 0 0 0  Feeling bad or failure about yourself  0 0 0  Trouble concentrating 1 0 1  Moving slowly or fidgety/restless 1 0 0  Suicidal thoughts 0 0 0  PHQ-9 Score 3 0 3  Difficult doing work/chores Not difficult at all Not difficult at all Not difficult at all    BP Readings from Last 3 Encounters:  09/25/23 118/68  08/28/23 122/72  08/03/23 (!) 159/78    Physical Exam Vitals and nursing note reviewed.  Constitutional:      General: She is not in acute distress.    Appearance: Normal appearance. She is well-developed.  HENT:     Head: Normocephalic and atraumatic.  Neck:     Vascular: No carotid bruit.  Cardiovascular:     Rate and Rhythm: Normal rate and regular rhythm.     Heart sounds: No murmur heard. Pulmonary:     Effort: Pulmonary effort is normal. No respiratory distress.     Breath sounds: No wheezing or rhonchi.  Musculoskeletal:     Cervical back: Normal range of motion.     Right lower leg: No edema.     Left lower leg: No edema.  Lymphadenopathy:     Cervical: No cervical adenopathy.  Skin:    General: Skin is warm and dry.     Findings: No rash.  Neurological:     Mental Status: She is alert. Mental status is at baseline.  Psychiatric:        Attention and Perception:  Attention normal.        Mood and Affect: Mood is elated.        Speech: Speech normal.        Behavior: Behavior normal. Behavior is cooperative.        Thought Content: Thought content does not include suicidal ideation. Thought content does not include suicidal plan.        Cognition and Memory: Memory is impaired.     Wt Readings from Last 3 Encounters:  09/25/23 151 lb 6 oz (68.7 kg)  08/03/23 142 lb 8 oz (64.6 kg)  08/21/23 142 lb (64.4 kg)    BP 118/68   Pulse 65   Ht 5\' 7"  (1.702 m)   Wt 151 lb 6 oz (68.7 kg)   LMP  (LMP Unknown)   SpO2 97%   BMI 23.71 kg/m   Assessment and Plan:  Problem List Items Addressed This Visit       Unprioritized   Essential hypertension   Controlled BP with normal exam. Current regimen is Coreg, amlodipine and losartan. Will continue same medications; encourage continued reduced sodium diet.       Relevant Orders   CBC with Differential/Platelet   Severe major depression without psychotic features (HCC)   She is doing much better on Sertraline 100 mg and low dose Abilify Will check labs Reminded her to keep appointment with Psych at the end of this month Refills for 30 days provided      Relevant Medications   ARIPiprazole (ABILIFY) 2 MG tablet   sertraline (ZOLOFT) 100 MG tablet   Heart failure with improved ejection fraction (HFimpEF) (HCC)   She appears stable and euvolemic today. BP is well controlled. Recommend Cardiology follow up - continue Coreg and amlodipine, plus statin and Brilinta      Relevant Orders   Comprehensive metabolic panel   White  matter abnormality on MRI of brain - Primary   Noted while hospitalized after suicide attempt She has an appointment with Neurology in April to discuss further      Other Visit Diagnoses       Colon cancer screening       Relevant Orders   Fecal occult blood, imunochemical       Return in about 4 months (around 01/25/2024) for HTN, Depression.    Reubin Milan, MD [facility] [place]

## 2023-09-25 NOTE — Assessment & Plan Note (Signed)
 Noted while hospitalized after suicide attempt She has an appointment with Neurology in April to discuss further

## 2023-09-26 ENCOUNTER — Encounter: Payer: Self-pay | Admitting: Internal Medicine

## 2023-09-26 LAB — COMPREHENSIVE METABOLIC PANEL
ALT: 33 IU/L — ABNORMAL HIGH (ref 0–32)
AST: 28 IU/L (ref 0–40)
Albumin: 4.2 g/dL (ref 3.8–4.8)
Alkaline Phosphatase: 63 IU/L (ref 44–121)
BUN/Creatinine Ratio: 22 (ref 12–28)
BUN: 21 mg/dL (ref 8–27)
Bilirubin Total: 0.3 mg/dL (ref 0.0–1.2)
CO2: 25 mmol/L (ref 20–29)
Calcium: 8.8 mg/dL (ref 8.7–10.3)
Chloride: 100 mmol/L (ref 96–106)
Creatinine, Ser: 0.94 mg/dL (ref 0.57–1.00)
Globulin, Total: 2.6 g/dL (ref 1.5–4.5)
Glucose: 112 mg/dL — ABNORMAL HIGH (ref 70–99)
Potassium: 4.4 mmol/L (ref 3.5–5.2)
Sodium: 138 mmol/L (ref 134–144)
Total Protein: 6.8 g/dL (ref 6.0–8.5)
eGFR: 64 mL/min/{1.73_m2} (ref >59)

## 2023-09-26 LAB — CBC WITH DIFFERENTIAL/PLATELET
Basophils Absolute: 0.1 10*3/uL (ref 0.0–0.2)
Basos: 1 %
EOS (ABSOLUTE): 0.4 10*3/uL (ref 0.0–0.4)
Eos: 4 %
Hematocrit: 35.2 % (ref 34.0–46.6)
Hemoglobin: 11.7 g/dL (ref 11.1–15.9)
Immature Grans (Abs): 0 10*3/uL (ref 0.0–0.1)
Immature Granulocytes: 0 %
Lymphocytes Absolute: 2.6 10*3/uL (ref 0.7–3.1)
Lymphs: 28 %
MCH: 32.1 pg (ref 26.6–33.0)
MCHC: 33.2 g/dL (ref 31.5–35.7)
MCV: 96 fL (ref 79–97)
Monocytes Absolute: 0.5 10*3/uL (ref 0.1–0.9)
Monocytes: 6 %
Neutrophils Absolute: 5.5 10*3/uL (ref 1.4–7.0)
Neutrophils: 61 %
Platelets: 248 10*3/uL (ref 150–450)
RBC: 3.65 x10E6/uL — ABNORMAL LOW (ref 3.77–5.28)
RDW: 11.9 % (ref 11.7–15.4)
WBC: 9.1 10*3/uL (ref 3.4–10.8)

## 2023-09-27 DIAGNOSIS — Z1211 Encounter for screening for malignant neoplasm of colon: Secondary | ICD-10-CM | POA: Diagnosis not present

## 2023-09-28 LAB — FECAL OCCULT BLOOD, IMMUNOCHEMICAL: Fecal Occult Bld: POSITIVE — AB

## 2023-10-10 ENCOUNTER — Telehealth: Payer: Self-pay

## 2023-10-10 NOTE — Telephone Encounter (Signed)
 Copied from CRM [id number]. Topic: Clinical - Prescription Issue >> Oct 09, 2023  1:57 PM Fuller Mandril wrote: Reason for CRM: Patient husband called. States patient reied to refill medication and pharmacy does not have it in stock. Would like ticagrelor (BRILINTA) 90 MG TABS tablet sent to  [facility] Address: [address] [place], [place], [place] [address] Phone: [phone]  Thank You

## 2023-10-11 ENCOUNTER — Encounter: Payer: Self-pay | Admitting: Internal Medicine

## 2023-10-11 ENCOUNTER — Inpatient Hospital Stay: Payer: Medicare Other | Attending: Internal Medicine | Admitting: Internal Medicine

## 2023-10-11 ENCOUNTER — Other Ambulatory Visit: Payer: Self-pay | Admitting: Internal Medicine

## 2023-10-11 VITALS — BP 143/65 | HR 58 | Temp 98.6°F | Resp 19 | Wt 151.8 lb

## 2023-10-11 DIAGNOSIS — F01B3 Vascular dementia, moderate, with mood disturbance: Secondary | ICD-10-CM

## 2023-10-11 DIAGNOSIS — F0154 Vascular dementia, unspecified severity, with anxiety: Secondary | ICD-10-CM | POA: Diagnosis not present

## 2023-10-11 DIAGNOSIS — F0150 Vascular dementia without behavioral disturbance: Secondary | ICD-10-CM | POA: Insufficient documentation

## 2023-10-11 DIAGNOSIS — Z87891 Personal history of nicotine dependence: Secondary | ICD-10-CM | POA: Insufficient documentation

## 2023-10-11 DIAGNOSIS — Z803 Family history of malignant neoplasm of breast: Secondary | ICD-10-CM | POA: Diagnosis not present

## 2023-10-11 DIAGNOSIS — F0153 Vascular dementia, unspecified severity, with mood disturbance: Secondary | ICD-10-CM | POA: Insufficient documentation

## 2023-10-11 DIAGNOSIS — I25110 Atherosclerotic heart disease of native coronary artery with unstable angina pectoris: Secondary | ICD-10-CM

## 2023-10-11 MED ORDER — TICAGRELOR 90 MG PO TABS: 90.0000 mg | ORAL_TABLET | Freq: Two times a day (BID) | ORAL | 1 refills | Status: DC

## 2023-10-11 NOTE — Progress Notes (Signed)
 [facility] at [facility] [address]  [place], [place] [address] [phone]   Cognitive Survivorship Evaluation  Date of Service: 10/11/23 Patient Name: Karina Freeman Patient MRN: [id number] Patient DOB: 1951/04/14 Provider: Henreitta Leber, MD  Identifying Statement:  Karina Freeman is a 73 y.o. female who presents for initial consultation and evaluation regarding cognitive decline.    Referring Provider: Reubin Milan, MD [address] [place],  [place] [address]  History of Present Illness: The patient's records from the referring physician were obtained and reviewed and the patient interviewed to confirm this HPI.  Karina Freeman presents today to discuss cognitive changes over the past several years.  She describes modest impairment in attention, processing speed and short term memory.  There is increased anxiety and mood lability as well. She relies on assistance from husband for many ADLs.  Has had multiple hospitalizations for depression, CT and MRV for review today.  Otherwise denies focal complaints, no seizures, headaches.  Medications: Current Outpatient Medications on File Prior to Visit  Medication Sig Dispense Refill   amLODipine (NORVASC) 5 MG tablet Take 1 tablet (5 mg total) by mouth at bedtime. 90 tablet 1   ARIPiprazole (ABILIFY) 2 MG tablet Take 1 tablet (2 mg total) by mouth daily. 30 tablet 0   aspirin 81 MG chewable tablet Chew 1 tablet (81 mg total) by mouth daily. 90 tablet 3   atorvastatin (LIPITOR) 80 MG tablet Take 1 tablet (80 mg total) by mouth daily. 90 tablet 3   carvedilol (COREG) 12.5 MG tablet Take 1 tablet (12.5 mg total) by mouth 2 (two) times daily with a meal. 180 tablet 3   losartan (COZAAR) 50 MG tablet Take 1 tablet (50 mg total) by mouth daily. 90 tablet 1   nitroGLYCERIN (NITROSTAT) 0.4 MG SL tablet Place 1 tablet (0.4 mg total) under the tongue every 5 (five) minutes as needed for chest pain.  100 tablet 3   sertraline (ZOLOFT) 100 MG tablet Take 1 tablet (100 mg total) by mouth daily. 30 tablet 0   feeding supplement (ENSURE ENLIVE / ENSURE PLUS) LIQD Take 237 mLs by mouth 2 (two) times daily between meals. (Patient not taking: Reported on 10/11/2023) 237 mL 12   No current facility-administered medications on file prior to visit.    Allergies:  Allergies  Allergen Reactions   Citalopram Hydrobromide Nausea And Vomiting   Codeine Diarrhea and Nausea And Vomiting   Penicillin G Benzathine    Past Medical History:  Past Medical History:  Diagnosis Date   Abnormal Pap smear of cervix 11/11/2015   Acute encephalopathy 08/17/2023   Acute HFrEF (heart failure with reduced ejection fraction) (HCC) 02/22/2023   Acute ST elevation myocardial infarction (STEMI) of anterior wall (HCC) 02/15/2023   Anxiety    CVA (cerebral infarction)    Depressive disorder    GERD (gastroesophageal reflux disease)    Hyperlipidemia    Hypertension    IFG (impaired fasting glucose)    Osteoporosis    ST elevation myocardial infarction (STEMI) (HCC) 02/17/2023   Past Surgical History:  Past Surgical History:  Procedure Laterality Date   APPENDECTOMY     CORONARY IMAGING/OCT N/A 02/21/2023   Procedure: CORONARY IMAGING/OCT;  Surgeon: Yvonne Kendall, MD;  Location: [facility];  Service: Cardiovascular;  Laterality: N/A;   CORONARY PRESSURE/FFR STUDY N/A 02/21/2023   Procedure: CORONARY PRESSURE/FFR STUDY;  Surgeon: Yvonne Kendall, MD;  Location: [facility];  Service: Cardiovascular;  Laterality: N/A;   CORONARY STENT INTERVENTION N/A 02/21/2023   Procedure: CORONARY STENT INTERVENTION;  Surgeon: Yvonne Kendall, MD;  Location: [facility];  Service: Cardiovascular;  Laterality: N/A;   CORONARY/GRAFT ACUTE MI REVASCULARIZATION N/A 02/15/2023   Procedure: Coronary/Graft Acute MI Revascularization;  Surgeon: Iran Ouch, MD;  Location: [facility];  Service:  Cardiovascular;  Laterality: N/A;   LEFT HEART CATH AND CORONARY ANGIOGRAPHY N/A 02/15/2023   Procedure: LEFT HEART CATH AND CORONARY ANGIOGRAPHY;  Surgeon: Iran Ouch, MD;  Location: [facility];  Service: Cardiovascular;  Laterality: N/A;   LEFT HEART CATH AND CORONARY ANGIOGRAPHY N/A 02/21/2023   Procedure: LEFT HEART CATH AND CORONARY ANGIOGRAPHY;  Surgeon: Yvonne Kendall, MD;  Location: [facility];  Service: Cardiovascular;  Laterality: N/A;   TONSILLECTOMY     TUBAL LIGATION     Social History:  Social History   Socioeconomic History   Marital status: Married    Spouse name: [name]   Number of children: 3   Years of education: Not on file   Highest education level: Not on file  Occupational History   Not on file  Tobacco Use   Smoking status: Former    Current packs/day: 0.00    Types: Cigarettes    Quit date: 07/16/1973    Years since quitting: 50.2   Smokeless tobacco: Never  Vaping Use   Vaping status: Never Used  Substance and Sexual Activity   Alcohol use: No    Alcohol/week: 0.0 standard drinks of alcohol   Drug use: No   Sexual activity: Yes    Birth control/protection: None  Other Topics Concern   Not on file  Social History Narrative   Not on file   Social Drivers of Health   Financial Resource Strain: Not on file  Food Insecurity: No Food Insecurity (10/11/2023)   Hunger Vital Sign    Worried About Running Out of Food in the Last Year: Never true    Ran Out of Food in the Last Year: Never true  Transportation Needs: No Transportation Needs (08/30/2023)   PRAPARE - Administrator, Civil Service (Medical): No    Lack of Transportation (Non-Medical): No  Physical Activity: Not on file  Stress: Not on file  Social Connections: Moderately Integrated (08/03/2023)   Social Connection and Isolation Panel [NHANES]    Frequency of Communication with Friends and Family: Once a week    Frequency of Social Gatherings with Friends  and Family: Once a week    Attends Religious Services: 1 to 4 times per year    Active Member of [employer] or Organizations: Yes    Attends Banker Meetings: 1 to 4 times per year    Marital Status: Married  Catering manager Violence: Not At Risk (10/11/2023)   Humiliation, Afraid, Rape, and Kick questionnaire    Fear of Current or Ex-Partner: No    Emotionally Abused: No    Physically Abused: No    Sexually Abused: No   Family History:  Family History  Problem Relation Age of Onset   Heart disease Mother    Cancer Mother        breast   Heart disease Father    Heart disease Brother    Bipolar disorder Brother    Heart disease Sister    Breast cancer Neg Hx     Review of Systems: Constitutional: Doesn't report fevers, chills  or abnormal weight loss Eyes: Doesn't report blurriness of vision Ears, nose, mouth, throat, and face: Doesn't report sore throat Respiratory: Doesn't report cough, dyspnea or wheezes Cardiovascular: Doesn't report palpitation, chest discomfort  Gastrointestinal:  Doesn't report nausea, constipation, diarrhea GU: Doesn't report incontinence Skin: Doesn't report skin rashes Neurological: Per HPI Musculoskeletal: Doesn't report joint pain Behavioral/Psych: +anxiety  Physical Exam: Vitals:   10/11/23 1021  BP: (!) 143/65  Pulse: (!) 58  Resp: 19  Temp: 98.6 F (37 C)  SpO2: 99%   General: Alert, cooperative, pleasant, in no acute distress Head: Normal EENT: No conjunctival injection or scleral icterus.  Lungs: Resp effort normal Cardiac: Regular rate Abdomen: Non-distended abdomen Skin: No rashes cyanosis or petechiae. Extremities: No clubbing or edema  Neurologic Exam: Mental Status: Awake, alert, attentive to examiner. Oriented to self and environment. Language is fluent with intact comprehension.  Significant impairment in recall and processing speed. Cranial Nerves: Visual acuity is grossly normal. Visual fields are full.  Extra-ocular movements intact. No ptosis. Face is symmetric Motor: Tone and bulk are normal. Power is full in both arms and legs.  Sensory: Intact to light touch Gait: Normal.   Labs: I have reviewed the data as listed    Component Value Date/Time   NA 138 09/25/2023 1607   K 4.4 09/25/2023 1607   CL 100 09/25/2023 1607   CO2 25 09/25/2023 1607   GLUCOSE 112 (H) 09/25/2023 1607   GLUCOSE 94 08/17/2023 1046   BUN 21 09/25/2023 1607   CREATININE 0.94 09/25/2023 1607   CALCIUM 8.8 09/25/2023 1607   PROT 6.8 09/25/2023 1607   ALBUMIN 4.2 09/25/2023 1607   AST 28 09/25/2023 1607   ALT 33 (H) 09/25/2023 1607   ALKPHOS 63 09/25/2023 1607   BILITOT 0.3 09/25/2023 1607   GFRNONAA >60 08/17/2023 1046   GFRAA 62 06/25/2016 1631   Lab Results  Component Value Date   WBC 9.1 09/25/2023   NEUTROABS 5.5 09/25/2023   HGB 11.7 09/25/2023   HCT 35.2 09/25/2023   MCV 96 09/25/2023   PLT 248 09/25/2023   Imaging:  CLINICAL DATA:  Mental status change with unknown cause.   EXAM: CT HEAD WITHOUT CONTRAST   TECHNIQUE: Contiguous axial images were obtained from the base of the skull through the vertex without intravenous contrast.   RADIATION DOSE REDUCTION: This exam was performed according to the departmental dose-optimization program which includes automated exposure control, adjustment of the mA and/or kV according to patient size and/or use of iterative reconstruction technique.   COMPARISON:  05/03/2023   FINDINGS: Brain: No evidence of acute infarction, hemorrhage, hydrocephalus, extra-axial collection or mass lesion/mass effect. Extensive low-density in the cerebral white matter attributed to chronic small vessel ischemia. Chronic perforator infarct at the left basal ganglia. Chronic lacunar infarcts in the bilateral deep gray nuclei. Patchy chronic infarction in the right cerebellum as well. Mild for age cerebral volume loss.   Vascular: No hyperdense vessel or  unexpected calcification.   Skull: Normal. Negative for fracture or focal lesion.   Sinuses/Orbits: No acute finding.   IMPRESSION: 1. No acute finding. 2. Extensive chronic small vessel ischemia.     Electronically Signed   By: Tiburcio Pea M.D.   On: 08/17/2023 16:50   CLINICAL DATA:  Dementia.   EXAM: MR VENOGRAM HEAD WITHOUT AND WITH CONTRAST   TECHNIQUE: Angiographic images of the intracranial venous structures were acquired using MRV technique without and with intravenous contrast.   CONTRAST:  6mL GADAVIST  GADOBUTROL 1 MMOL/ML IV SOLN   COMPARISON:  None Available.   FINDINGS: Superior sagittal sinus: Normal.   Straight sinus: Normal.   Inferior sagittal sinus, vein of Galen, basal veins and internal cerebral veins: Normal.   Transverse sinuses: Normal.   Sigmoid sinuses: Normal.   Visualized jugular veins: Normal.   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 Chronic white matter disease and remote callosal, bilateral basal ganglia, thalami and right cerebellar infarcts.   IMPRESSION: 1. No evidence of dural venous sinus thrombosis. 2. 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     Electronically Signed   By: Baldemar Lenis M.D.   On: 08/21/2023 16:49   Assessment/Plan:  Cognitive Changes  Karina Freeman presents with clinical syndrome consistent with frank dementia.  Elements of MOCA screening were performed at bedside, estimated score would not exceed 10/30.    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  She has noted history of vascular disease with MI and stroke; CT head demonstrates very advanced chronic ischemic changes.   She and her husband understand there is no formal treatment for vascular dementia aside from ongoing risk factor modification.  We provided  counseling regarding healthy behaviors to maintain cognitive function, including exercise, diet, and positive outlook.  We discussed mindful relaxation and provided some methods to redirect anxiety without medication.   Did discuss obtaining and MRI and MRA given her dementia syndrome and incidental ACOM aneurysm on MRV study.  This is at low risk of annual rupture given size and lack of symptoms.  She declined further imaging at this time.  We spent twenty additional minutes teaching regarding the natural history, biology, and historical experience in the treatment of neurologic conditions.  We appreciate the opportunity to participate in the care of Karina Freeman.  We encouraged follow up for progressive cognitive issues or neurologic symptoms if they develop.  All questions were answered. The patient knows to call the clinic with any problems, questions or concerns. No barriers to learning were detected.  The total time spent in the encounter was 60 minutes and more than 50% was on counseling and review of test results   Henreitta Leber, MD Medical Director of Neuro-Oncology [facility] 10/11/23 11:19 AM
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
## 2023-10-14 NOTE — Telephone Encounter (Signed)
 Spoke to pt and her husband told them the medication was sent to [facility]. They stated [facility] has already filled the medication and they are going to pick it up tomorrow. Told pt to call back if she has any problems getting the medication.  KP

## 2023-10-14 NOTE — Telephone Encounter (Signed)
 Patient spouse wanted PCP to know [facility] has script in stock now and he will pick up from  [id number] - [name], [place] - [address] AT [facility] [address] [facility] Phone: [phone]  Fax: [phone]     So please disregard initial request to send it to [facility].

## 2023-10-16 ENCOUNTER — Telehealth: Payer: Self-pay | Admitting: Cardiovascular Disease

## 2023-10-16 MED ORDER — AMLODIPINE BESYLATE 5 MG PO TABS: 5.0000 mg | ORAL_TABLET | Freq: Every evening | ORAL | 0 refills | Status: DC

## 2023-10-16 NOTE — Telephone Encounter (Signed)
 Reather Littler D, NP  [facility] (11:29 AM)    When I last spoke with patient in 8/23 she was to take amlodipine 5 mg daily, was to follow up the following month. She can have a refill of amlodipine 5 mg daily for one month, she needs to follow up in [place] office. Thank you, Karina Freeman    Patient identification verified by 2 forms. Karina Rail, RN    Called and spoke to patients husband Karina Freeman states:   -Patient has been taking Amlodipine 5 mg for past few months   -with recent Rx pick up patient has been taking Amlodipine 2.5 mg  Informed Karina Freeman Rx sent for Amlodipine 5 mg 30 day supply with no refills  Patient scheduled for OV 4/17. Encouraged to follow up at visit  Karina Freeman verbalized understanding, no questions at this time

## 2023-10-16 NOTE — Telephone Encounter (Signed)
 Pt c/o medication issue:  1. Name of Medication: amLODipine (NORVASC) 5 MG tablet (Expired)   2. How are you currently taking this medication (dosage and times per day)? As written  3. Are you having a reaction (difficulty breathing--STAT)? No   4. What is your medication issue? They picked up refill from pharmacy and said it was 2.5 and wants top know why

## 2023-10-17 ENCOUNTER — Ambulatory Visit: Payer: Medicare Other | Admitting: Psychiatry

## 2023-10-17 ENCOUNTER — Encounter: Payer: Self-pay | Admitting: Psychiatry

## 2023-10-17 VITALS — BP 148/78 | HR 59 | Temp 98.2°F | Ht 67.0 in | Wt 150.6 lb

## 2023-10-17 DIAGNOSIS — F3341 Major depressive disorder, recurrent, in partial remission: Secondary | ICD-10-CM | POA: Diagnosis not present

## 2023-10-17 DIAGNOSIS — Z79899 Other long term (current) drug therapy: Secondary | ICD-10-CM

## 2023-10-17 DIAGNOSIS — R419 Unspecified symptoms and signs involving cognitive functions and awareness: Secondary | ICD-10-CM

## 2023-10-17 MED ORDER — ARIPIPRAZOLE 2 MG PO TABS: 2.0000 mg | ORAL_TABLET | Freq: Every day | ORAL | 0 refills | Status: DC

## 2023-10-17 MED ORDER — SERTRALINE HCL 100 MG PO TABS: 100.0000 mg | ORAL_TABLET | Freq: Every day | ORAL | 0 refills | Status: DC

## 2023-10-17 NOTE — Patient Instructions (Signed)
 Continue sertraline 100 mg at night  Continue Abilify 2 mg at night  Obtain lab (folic acid, vitamin B12, ferritin) at [facility] Obtain EKG - please call [phone] to make an appointment  Next appointment- 5/8 at 4:30

## 2023-10-25 NOTE — Telephone Encounter (Signed)
 Pt called. Pt is aware.  kP

## 2023-10-25 NOTE — Telephone Encounter (Signed)
 Copied from CRM [id number]. Topic: Clinical - Medication Question >> Oct 25, 2023  9:35 AM [name] wrote: Reason for CRM: The patient's husband, Karina Freeman, states the patient feels a cold coming on. She has a runny nose but he states she feels alright overall. She is requesting to see if she can take Mucinex DM with her current medications or if she should take something else. His call back number is [phone].

## 2023-10-25 NOTE — Telephone Encounter (Signed)
 Please review.  KP

## 2023-10-28 ENCOUNTER — Other Ambulatory Visit: Payer: Self-pay | Admitting: Internal Medicine

## 2023-10-28 DIAGNOSIS — I10 Essential (primary) hypertension: Secondary | ICD-10-CM

## 2023-10-29 NOTE — Progress Notes (Unsigned)
 Cardiology Clinic Note   Date: 10/31/2023 ID: DAVI ROTAN, DOB [birth year], MRN [id number]  Primary Cardiologist:  Antionette Kirks, MD  Chief Complaint   Karina Freeman is a 73 y.o. female who presents to the clinic today for routine follow up.   Patient Profile   Karina Freeman is followed by Dr. Alvenia Aus for the history outlined below.      Past medical history significant for: CAD. LHC 02/15/2023 (STEMI): Proximal to mid RCA 80%.  Ostial to proximal LCx 30%.  LPAV 50%.  Mid LAD #1 99%, #2 70%, #3 30%.  D2 40%.  Distal LAD 50%.  PCI with DES 3.0 x 34 mm to mid LAD. LHC 02/21/2023 (unstable angina): Mid to distal LAD, LCx, and RCA disease similar to prior catheterization.  Moderate to severe RCA not hemodynamically significant per FFR.  Focal dissection of the proximal edge of recently placed proximal to mid LAD stent extending into the media.  OCT guided PCI to proximal LAD edge dissection with DES 3.0 x 18 mm overlapping with previously placed stent. HFimpEF/ischemic cardiomyopathy. Echo 02/15/2023: EF 30 to 35%.  Akinesis of the left ventricle, entire anteroseptal wall, mid-apical anterior segment.  Mild LVH.  Grade I DD.  Normal RV size/function.  Mild MR. Limited echo 02/22/2023: EF 65 to 70%.  Normal RV size/function.  Mild MR. Hypertension. Hyperlipidemia. Lipid panel 03/14/2023: LDL 56, HDL 52, TG 86, total 125. GERD. Vascular dementia. CVA. CKD stage III.  In summary, patient with a history of CAD with anterior STEMI in August 2024.  She underwent PCI with DES to mid LAD with plan for staged PCI to RCA at a later date.  Echo at the time of her STEMI demonstrated an EF of 30 to 35% as detailed above.  5 days after her STEMI she had sudden onset of substernal chest pressure and heaviness not relieved with aspirin and NTG.  She underwent repeat LHC which showed focal dissection of proximal edge of LAD stent and she underwent overlapping stent placement as detailed above.  Repeat  limited echo at that time showed recovered LV function as detailed above.  Patient was last seen in the office by Katlyn West, NP on 03/07/2023 for routine follow-up.  She was doing well at that time.  She reported elevated BP readings at home.  She was hypertensive at the time of her visit.  Her losartan had been decreased and spironolactone stopped on 02/28/2023 in the setting of elevated creatinine.  Losartan was increased.  Following her visit she contacted the office with report of continued elevation of BP although improved.  Amlodipine was added.     History of Present Illness    Today, patient is accompanied by her husband. She is doing well. Patient denies shortness of breath, dyspnea on exertion, lower extremity edema, orthopnea or PND. No chest pain, pressure, or tightness. No palpitations. Her husband requests labs today, as she recently had a new medication added and was told she would need labs.       ROS: All other systems reviewed and are otherwise negative except as noted in History of Present Illness.  EKGs/Labs Reviewed    EKG Interpretation Date/Time:  Thursday October 31 2023 14:05:50 EDT Ventricular Rate:  64 PR Interval:  130 QRS Duration:  86 QT Interval:  448 QTC Calculation: 462 R Axis:   66  Text Interpretation: Normal sinus rhythm Normal ECG When compared with ECG of 05/03/2023 No significant change Confirmed by Morey Ar (  [id number]) on 10/31/2023 2:35:51 PM   09/25/2023: ALT 33; AST 28; BUN 21; Creatinine, Ser 0.94; Potassium 4.4; Sodium 138   09/25/2023: Hemoglobin 11.7; WBC 9.1   03/26/2023: TSH 1.050   02/21/2023: B Natriuretic Peptide 196.5    Physical Exam    VS:  BP 122/60 (BP Location: Left Arm)   Pulse 64   Ht 5\' 7"  (1.702 m)   Wt 153 lb (69.4 kg)   LMP  (LMP Unknown)   SpO2 96%   BMI 23.96 kg/m  , BMI Body mass index is 23.96 kg/m.  GEN: Well nourished, well developed, in no acute distress. Neck: No JVD or carotid bruits. Cardiac:   RRR. No murmurs. No rubs or gallops.   Respiratory:  Respirations regular and unlabored. Clear to auscultation without rales, wheezing or rhonchi. GI: Soft, nontender, nondistended. Extremities: Radials/DP/PT 2+ and equal bilaterally. No clubbing or cyanosis. No edema.  Skin: Warm and dry, no rash. Neuro: Strength intact.  Assessment & Plan   CAD S/p PCI with DES to mid LAD August 2024 in the setting of STEMI.  PCI with overlapping DES to proximal LAD edge dissection in the setting of unstable angina 5 days following original stent placement.  Patient denies chest pain, pressure or tightness. EKG today shows NSR.  - Continue amlodipine, carvedilol, aspirin, Brilinta, atorvastatin, as needed SL NTG.  HFimpEF/ischemic cardiomyopathy Echo 02/15/2023 demonstrated EF 30 to 35% with akinesis and Grade I DD.  Limited echo on 02/22/2023 demonstrated EF 65 to 70%.  Patient denies lower extremity edema, shortness of breath, DOE, orthopnea or PND. Euvolemic and well compensated on exam. - Continue carvedilol, losartan. -CMP and CBC today.   Hypertension BP today 122/60. No headaches or dizziness reported.  - Continue amlodipine, carvedilol, losartan.  Hyperlipidemia LDL 50 19 February 2023, at goal. - Continue atorvastatin.  Disposition: CBC, CMP today. Return in 6 months or sooner as needed.          Signed, Lonell Rives. Jasean Ambrosia, DNP, NP-C

## 2023-10-29 NOTE — Telephone Encounter (Signed)
 Requested Prescriptions  Pending Prescriptions Disp Refills   losartan (COZAAR) 50 MG tablet [Pharmacy Med Name: LOSARTAN 50MG  TABLETS] 90 tablet 0    Sig: TAKE 1 TABLET(50 MG) BY MOUTH DAILY     Cardiovascular:  Angiotensin Receptor Blockers Failed - 10/29/2023  1:45 PM      Failed - Last BP in normal range    BP Readings from Last 1 Encounters:  10/11/23 (!) 143/65         Passed - Cr in normal range and within 180 days    Creatinine, Ser  Date Value Ref Range Status  09/25/2023 0.94 0.57 - 1.00 mg/dL Final         Passed - K in normal range and within 180 days    Potassium  Date Value Ref Range Status  09/25/2023 4.4 3.5 - 5.2 mmol/L Final         Passed - Patient is not pregnant      Passed - Valid encounter within last 6 months    Recent Outpatient Visits           1 month ago White matter abnormality on MRI of brain   [facility], Chales Colorado, MD       Future Appointments             In 2 days [place], Bernardo Bridgeman, NP [facility]   In 3 months Gala Jubilee, Chales Colorado, MD [facility] at [facility] [place]

## 2023-10-31 ENCOUNTER — Ambulatory Visit: Attending: Student | Admitting: Student

## 2023-10-31 ENCOUNTER — Encounter: Payer: Self-pay | Admitting: Student

## 2023-10-31 VITALS — BP 122/60 | HR 64 | Ht 67.0 in | Wt 153.0 lb

## 2023-10-31 DIAGNOSIS — E785 Hyperlipidemia, unspecified: Secondary | ICD-10-CM | POA: Diagnosis not present

## 2023-10-31 DIAGNOSIS — Z79899 Other long term (current) drug therapy: Secondary | ICD-10-CM | POA: Diagnosis not present

## 2023-10-31 DIAGNOSIS — I5032 Chronic diastolic (congestive) heart failure: Secondary | ICD-10-CM

## 2023-10-31 DIAGNOSIS — I255 Ischemic cardiomyopathy: Secondary | ICD-10-CM

## 2023-10-31 DIAGNOSIS — I10 Essential (primary) hypertension: Secondary | ICD-10-CM

## 2023-10-31 DIAGNOSIS — I2510 Atherosclerotic heart disease of native coronary artery without angina pectoris: Secondary | ICD-10-CM | POA: Diagnosis not present

## 2023-10-31 MED ORDER — AMLODIPINE BESYLATE 5 MG PO TABS: 5.0000 mg | ORAL_TABLET | Freq: Every evening | ORAL | 3 refills | Status: AC

## 2023-10-31 MED ORDER — LOSARTAN POTASSIUM 50 MG PO TABS: 50.0000 mg | ORAL_TABLET | Freq: Every day | ORAL | 3 refills | Status: AC

## 2023-10-31 NOTE — Patient Instructions (Signed)
 Medication Instructions: Your physician recommends that you continue on your current medications as directed. Please refer to the Current Medication list given to you today.   *If you need a refill on your cardiac medications before your next appointment, please call your pharmacy*  Lab Work: CBC & CMET   If you have labs (blood work) drawn today and your tests are completely normal, you will receive your results only by: MyChart Message (if you have MyChart) OR A paper copy in the mail If you have any lab test that is abnormal or we need to change your treatment, we will call you to review the results.  Testing/Procedures: NONE ORDERED    Follow-Up: At [facility], you and your health needs are our priority.  As part of our continuing mission to provide you with exceptional heart care, our providers are all part of one team.  This team includes your primary Cardiologist (physician) and Advanced Practice Providers or APPs (Physician Assistants and Nurse Practitioners) who all work together to provide you with the care you need, when you need it.  Your next appointment:   6 month(s)  Provider:   You will see one of the following Advanced Practice Providers on your designated Care Team:   Laneta Pintos, NP Gildardo Labrador, PA-C Varney Gentleman, PA-C Cadence Leonard, PA-C Ronald Cockayne, NP Morey Ar, NP   We recommend signing up for the patient portal called "MyChart".  Sign up information is provided on this After Visit Summary.  MyChart is used to connect with patients for Virtual Visits (Telemedicine).  Patients are able to view lab/test results, encounter notes, upcoming appointments, etc.  Non-urgent messages can be sent to your provider as well.   To learn more about what you can do with MyChart, go to [email or web address].

## 2023-11-01 LAB — COMPREHENSIVE METABOLIC PANEL WITH GFR
ALT: 29 IU/L (ref 0–32)
AST: 29 IU/L (ref 0–40)
Albumin: 4.2 g/dL (ref 3.8–4.8)
Alkaline Phosphatase: 75 IU/L (ref 44–121)
BUN/Creatinine Ratio: 14 (ref 12–28)
BUN: 16 mg/dL (ref 8–27)
Bilirubin Total: 0.3 mg/dL (ref 0.0–1.2)
CO2: 25 mmol/L (ref 20–29)
Calcium: 9 mg/dL (ref 8.7–10.3)
Chloride: 102 mmol/L (ref 96–106)
Creatinine, Ser: 1.12 mg/dL — ABNORMAL HIGH (ref 0.57–1.00)
Globulin, Total: 2.6 g/dL (ref 1.5–4.5)
Glucose: 116 mg/dL — ABNORMAL HIGH (ref 70–99)
Potassium: 3.9 mmol/L (ref 3.5–5.2)
Sodium: 141 mmol/L (ref 134–144)
Total Protein: 6.8 g/dL (ref 6.0–8.5)
eGFR: 52 mL/min/{1.73_m2} — ABNORMAL LOW (ref >59)

## 2023-11-01 LAB — CBC
Hematocrit: 35.2 % (ref 34.0–46.6)
Hemoglobin: 11.5 g/dL (ref 11.1–15.9)
MCH: 31.3 pg (ref 26.6–33.0)
MCHC: 32.7 g/dL (ref 31.5–35.7)
MCV: 96 fL (ref 79–97)
Platelets: 231 10*3/uL (ref 150–450)
RBC: 3.68 x10E6/uL — ABNORMAL LOW (ref 3.77–5.28)
RDW: 12 % (ref 11.7–15.4)
WBC: 7.1 10*3/uL (ref 3.4–10.8)

## 2023-11-08 ENCOUNTER — Telehealth: Payer: Self-pay | Admitting: Internal Medicine

## 2023-11-08 NOTE — Telephone Encounter (Signed)
Sent pt a message on Mychart.  KP

## 2023-11-08 NOTE — Telephone Encounter (Signed)
 Please review.  KP

## 2023-11-08 NOTE — Telephone Encounter (Signed)
 Called pt left VM to call back.  KP

## 2023-11-08 NOTE — Telephone Encounter (Signed)
 Copied from CRM [id number]. Topic: Clinical - Medication Question >> Oct 25, 2023  9:35 AM [name] wrote: Reason for CRM: The patient's husband, Karina Freeman, states the patient feels a cold coming on. She has a runny nose but he states she feels alright overall. She is requesting to see if she can take Mucinex DM with her current medications or if she should take something else. His call back number is [phone]. >> Nov 08, 2023  9:09 AM [name] wrote: Karina Freeman is calling back to see if [name] can take Mucinex DM, He never heard anything back. His call back number is [phone].

## 2023-11-16 NOTE — Progress Notes (Unsigned)
 BH MD/PA/NP OP Progress Note  11/21/2023 5:25 PM Karina Freeman  MRN:  [id number]  Chief Complaint:  Chief Complaint  Patient presents with   Follow-up   HPI:  This is a follow-up appointment for depression, neurocognitive impairment.  When she was asked how she is doing, she answers with "I tried to commit suicide one time [name] helped to explain prior to the recent admission)."  However, she is still here, and she feels happy.  She shows her nails, smiling. She enjoys doing this.  When she was asked about to reflect on the recent admission/suicide attempt, she states that she wanted to "clean everything out" as she was worried.  She states that she was at her mother's age, and feels scared that something were to happen (according to [name], her mother was 49-76 yo).  She has been getting up more.  She watches TV, or doing exercise, doing stepping.  She described her recent sandal purchase by saying, "You know, you can see what kind of shoes you're wearing," in an effort to explain the style.  She denies feeling depressed.  She denies anxiety.  She denies SI, HI, hallucinations or paranoia.   [name], her husband presents to the visit.  He states that she is good 75% of the time. She occasionally says things which is difficult to understand.  She appears to be focusing on something at times. He shares an example of her trying to send all the social security money in the bank early in the morning. He called her daughter as she is more receptive to her. He also shares an episode of her becoming upset when he went out to buy food, although she agreed with it Mylinda Asa stated that it was his idea, while she said she could eat at home, but "you left."). No safety issues were reported.   We discussed the concern [name] raised in my chart message. He states that she knew she was in the hospital when she was admitted twice. It is discussed that the note indicated that she denies suicidal ideation during each  admission. He expressed understanding.  He denies any need to make an addendum.   Wt Readings from Last 3 Encounters:  11/21/23 151 lb 3.2 oz (68.6 kg)  10/31/23 153 lb (69.4 kg)  10/17/23 150 lb 9.6 oz (68.3 kg)       Household: husband Marital status: married since 2008.  Number of children: 3 (2 sons, 1 daughter in [place]) Employment: unemployed, used to work for [employer] years ago Education: did not graduate from [employer] (did not want to go there),  GED She grew up in [place]. She had "good" relationship with her parents  Visit Diagnosis:    ICD-10-CM   1. Neurocognitive disorder  R41.9     2. MDD (major depressive disorder), recurrent, in partial remission (HCC)  F33.41 ARIPiprazole  (ABILIFY ) 2 MG tablet    sertraline  (ZOLOFT ) 100 MG tablet      Past Psychiatric History: Please see initial evaluation for full details. I have reviewed the history. No updates at this time.     Past Medical History:  Past Medical History:  Diagnosis Date   Abnormal Pap smear of cervix 11/11/2015   Acute encephalopathy 08/17/2023   Acute HFrEF (heart failure with reduced ejection fraction) (HCC) 02/22/2023   Acute ST elevation myocardial infarction (STEMI) of anterior wall (HCC) 02/15/2023   Anxiety    CVA (cerebral infarction)    Depressive disorder    GERD (  gastroesophageal reflux disease)    Hyperlipidemia    Hypertension    IFG (impaired fasting glucose)    Osteoporosis    ST elevation myocardial infarction (STEMI) (HCC) 02/17/2023    Past Surgical History:  Procedure Laterality Date   APPENDECTOMY     CORONARY IMAGING/OCT N/A 02/21/2023   Procedure: CORONARY IMAGING/OCT;  Surgeon: Sammy Crisp, MD;  Location: [facility] INVASIVE CV LAB;  Service: Cardiovascular;  Laterality: N/A;   CORONARY PRESSURE/FFR STUDY N/A 02/21/2023   Procedure: CORONARY PRESSURE/FFR STUDY;  Surgeon: Sammy Crisp, MD;  Location: [facility] INVASIVE CV LAB;  Service: Cardiovascular;  Laterality: N/A;   CORONARY  STENT INTERVENTION N/A 02/21/2023   Procedure: CORONARY STENT INTERVENTION;  Surgeon: Sammy Crisp, MD;  Location: [facility] INVASIVE CV LAB;  Service: Cardiovascular;  Laterality: N/A;   CORONARY/GRAFT ACUTE MI REVASCULARIZATION N/A 02/15/2023   Procedure: Coronary/Graft Acute MI Revascularization;  Surgeon: Wenona Hamilton, MD;  Location: [facility] INVASIVE CV LAB;  Service: Cardiovascular;  Laterality: N/A;   LEFT HEART CATH AND CORONARY ANGIOGRAPHY N/A 02/15/2023   Procedure: LEFT HEART CATH AND CORONARY ANGIOGRAPHY;  Surgeon: Wenona Hamilton, MD;  Location: [facility] INVASIVE CV LAB;  Service: Cardiovascular;  Laterality: N/A;   LEFT HEART CATH AND CORONARY ANGIOGRAPHY N/A 02/21/2023   Procedure: LEFT HEART CATH AND CORONARY ANGIOGRAPHY;  Surgeon: Sammy Crisp, MD;  Location: [facility] INVASIVE CV LAB;  Service: Cardiovascular;  Laterality: N/A;   TONSILLECTOMY     TUBAL LIGATION      Family Psychiatric History: Please see initial evaluation for full details. I have reviewed the history. No updates at this time.     Family History:  Family History  Problem Relation Age of Onset   Heart disease Mother    Cancer Mother        breast   Heart disease Father    Heart disease Sister    Heart disease Brother    Bipolar disorder Brother    Breast cancer Neg Hx     Social History:  Social History   Socioeconomic History   Marital status: Married    Spouse name: [name]   Number of children: 3   Years of education: Not on file   Highest education level: GED or equivalent  Occupational History   Not on file  Tobacco Use   Smoking status: Former    Current packs/day: 0.00    Types: Cigarettes    Quit date: 07/16/1973    Years since quitting: 50.3   Smokeless tobacco: Never  Vaping Use   Vaping status: Never Used  Substance and Sexual Activity   Alcohol use: No    Alcohol/week: 0.0 standard drinks of alcohol   Drug use: No   Sexual activity: Yes    Birth control/protection: None  Other  Topics Concern   Not on file  Social History Narrative   Not on file   Social Drivers of Health   Financial Resource Strain: Not on file  Food Insecurity: No Food Insecurity (10/11/2023)   Hunger Vital Sign    Worried About Running Out of Food in the Last Year: Never true    Ran Out of Food in the Last Year: Never true  Transportation Needs: No Transportation Needs (08/30/2023)   PRAPARE - Administrator, Civil Service (Medical): No    Lack of Transportation (Non-Medical): No  Physical Activity: Not on file  Stress: Not on file  Social Connections: Moderately Integrated (08/03/2023)   Social Connection and Isolation Panel [NHANES]  Frequency of Communication with Friends and Family: Once a week    Frequency of Social Gatherings with Friends and Family: Once a week    Attends Religious Services: 1 to 4 times per year    Active Member of [employer] or Organizations: Yes    Attends Banker Meetings: 1 to 4 times per year    Marital Status: Married    Allergies:  Allergies  Allergen Reactions   Citalopram Hydrobromide Nausea And Vomiting   Codeine Diarrhea and Nausea And Vomiting   Penicillin G Benzathine     Metabolic Disorder Labs: Lab Results  Component Value Date   HGBA1C 5.4 02/15/2023   MPG 108.28 02/15/2023   No results found for: "PROLACTIN" Lab Results  Component Value Date   CHOL 125 03/14/2023   TRIG 86 03/14/2023   HDL 52 03/14/2023   CHOLHDL 2.4 03/14/2023   VLDL 17 02/15/2023   LDLCALC 56 03/14/2023   LDLCALC 164 (H) 02/15/2023   Lab Results  Component Value Date   TSH 1.050 03/26/2023    Therapeutic Level Labs: No results found for: "LITHIUM" No results found for: "VALPROATE" No results found for: "CBMZ"  Current Medications: Current Outpatient Medications  Medication Sig Dispense Refill   amLODipine  (NORVASC ) 5 MG tablet Take 1 tablet (5 mg total) by mouth at bedtime. 90 tablet 3   aspirin  81 MG chewable tablet Chew 1  tablet (81 mg total) by mouth daily. 90 tablet 3   atorvastatin  (LIPITOR ) 80 MG tablet Take 1 tablet (80 mg total) by mouth daily. 90 tablet 3   feeding supplement (ENSURE ENLIVE / ENSURE PLUS) LIQD Take 237 mLs by mouth 2 (two) times daily between meals. 237 mL 12   losartan  (COZAAR ) 50 MG tablet Take 1 tablet (50 mg total) by mouth daily. 90 tablet 3   nitroGLYCERIN  (NITROSTAT ) 0.4 MG SL tablet Place 1 tablet (0.4 mg total) under the tongue every 5 (five) minutes as needed for chest pain. 100 tablet 3   ticagrelor  (BRILINTA ) 90 MG TABS tablet Take 1 tablet (90 mg total) by mouth 2 (two) times daily. 180 tablet 1   [START ON 11/24/2023] ARIPiprazole  (ABILIFY ) 2 MG tablet Take 1 tablet (2 mg total) by mouth daily. 30 tablet 1   carvedilol  (COREG ) 12.5 MG tablet Take 1 tablet (12.5 mg total) by mouth 2 (two) times daily with a meal. 180 tablet 3   [START ON 11/24/2023] sertraline  (ZOLOFT ) 100 MG tablet Take 1 tablet (100 mg total) by mouth daily. 30 tablet 1   No current facility-administered medications for this visit.     Musculoskeletal: Strength & Muscle Tone: within normal limits Gait & Station: normal Patient leans: N/A  Psychiatric Specialty Exam: Review of Systems  Psychiatric/Behavioral: Negative.    All other systems reviewed and are negative.   Blood pressure 129/73, pulse 71, temperature (!) 97.2 F (36.2 C), temperature source Temporal, height 5\' 7"  (1.702 m), weight 151 lb 3.2 oz (68.6 kg).Body mass index is 23.68 kg/m.  General Appearance: Well Groomed  Eye Contact:  Good  Speech:  Clear and Coherent  Volume:  Normal  Mood:  good  Affect:  Appropriate, Congruent, and Full Range, slightly labile  Thought Process:  Coherent and Irrelevant  Orientation:  Full (Time, Place, and Person)  Thought Content: Logical   Suicidal Thoughts:  No  Homicidal Thoughts:  No  Memory:  Immediate;   Good  Judgement:  Good  Insight:  Present  Psychomotor Activity:  Normal  Concentration:  Concentration: Good and Attention Span: Good  Recall:  Poor  Fund of Knowledge: Good  Language: Good  Akathisia:  No  Handed:  Right  AIMS (if indicated): not done  Assets:  Desire for Improvement Social Support  ADL's:  Intact  Cognition: WNL  Sleep:  Good   Screenings: AUDIT    Flowsheet Row Admission (Discharged) from 08/03/2023 in [facility] BEHAVIORAL MEDICINE  Alcohol Use Disorder Identification Test Final Score (AUDIT) 0      GAD-7    Flowsheet Row Office Visit from 09/25/2023 in [facility] Primary Care & Sports Medicine at [facility] Office Visit from 05/28/2023 in [facility] Medicine at [facility] Office Visit from 03/26/2023 in [facility] Primary Care & Sports Medicine at [facility]  Total GAD-7 Score 1 0 4      PHQ2-9    Flowsheet Row Office Visit from 10/11/2023 in [facility] - A Dept Of . [facility] Office Visit from 09/25/2023 in [facility] Primary Care & Sports Medicine at [facility] Office Visit from 05/28/2023 in [facility] Primary Care & Sports Medicine at [facility] Cardiac Rehab from 04/04/2023 in [facility]iac [facility] Rehab Office Visit from 03/26/2023 in [facility] Primary Care & Sports Medicine at [facility]  PHQ-2 Total Score 0 0 0 0 1  PHQ-9 Total Score 0 3 0 3 5      Flowsheet Row Admission (Discharged) from 08/03/2023 in [facility] BEHAVIORAL MEDICINE ED from 08/02/2023 in [facility] Emergency Department at [facility] Admission (Discharged) from 05/04/2023 in [facility] BEHAVIORAL MEDICINE  C-SSRS RISK CATEGORY High Risk High Risk No Risk        Assessment and Plan:  TRYSTA ULREY is a 73 y.o. year old female with a history of depression, vascular dementia, CVA, CAD s/p anterior STEMI and LAD PCI, HFimpEF, hypertension, who is referred for depression.   1. MDD (major depressive disorder), recurrent, in partial  remission (HCC) Although both the patient and her husband deny any history of depression prior to the October 2024 admission, an extensive chart review reveals a documented history of depression, with citalopram listed as an allergy as early as 2016. During the recent admission, concern for catatonia was raised due to observed psychomotor slowing. Although the patient adamantly denies mood symptoms or suicidal ideation, the evaluation is complicated by her cognitive impairment. Additionally, her husband reported that she had become increasingly withdrawn in the weeks leading up to admission and stated he heard her express suicidal ideation at the scene. The differential diagnosis includes major depressive disorder and delirium in the context of an underlying neurocognitive disorder. She denies any depressive symptoms on today's evaluation.  Due to the recent episode of withdrawn, suicidal attempt which led to admission, will continue current medication regimen to avoid relapse in her mood symptoms.  Will continue sertraline  for depression, and Abilify  as adjunctive treatment for depression.     2. 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 Chronic white matter  disease and remote callosal, bilateral basal ganglia, thalami and right cerebellar infarcts. Neuropsych assessment:  Etiology: VaD  The exam is notable for word-finding difficulty, memory loss (forgot her pursue at the end of the interview), irrelevant thought process, slight irritability (against her husband), although she remains  well-engaged and cooperative throughout the visit.  According to the chart review, there is a concern of cognitive impairment which was observed during the admission, and the neurologist raised a possible diagnosis of vascular dementia. (Reported bedside MOCA to be reported not to exceed 10./30).  She was advised again to obtain labs to rule out medical health issues contributing to her symptoms.  We do more evaluation at the next visit, and may consider referral to OT.  Time was spent providing psychoeducation on behavioral modification strategies to both her husband and the patient.     Last checked  EKG HR 64, QTc462 msec 10/2023  Lipid panels LDL 56 02/2023  HbA1c 116 10/2022      Plan Continue sertraline  100 mg at night  Continue Abilify  2 mg at night  Obtain lab (folic acid, vitamin B12) at [facility] Next appointment- 7/1 at 3:30, IP    The patient demonstrates the following risk factors for suicide: Chronic risk factors for suicide include: psychiatric disorder of depression . Acute risk factors for suicide include: unemployment. Protective factors for this patient include: positive social support and hope for the future. Considering these factors, the overall suicide risk at this point appears to be moderate, but not at imminent risk. Guns are locked by her husband, and the patient does not have access to it. Patient is appropriate for outpatient follow up.   This visit involved a longitudinal and complex condition requiring extended medical decision-making, coordination of care, and management beyond what is typically captured in CPT 99214. The complexity of the patient's condition justifies the use of G2211.   Collaboration of Care: Collaboration of Care: Other reviewed notes in Epic, discussed treatment plans with her husband  Patient/Guardian was advised Release of Information must be obtained prior to any record release in order to collaborate their care with an outside provider.  Patient/Guardian was advised if they have not already done so to contact the registration department to sign all necessary forms in order for us  to release information regarding their care.   Consent: Patient/Guardian gives verbal consent for treatment and assignment of benefits for services provided during this visit. Patient/Guardian expressed understanding and agreed to proceed.    Todd Fossa, MD 11/21/2023, 5:25 PM

## 2023-11-21 ENCOUNTER — Ambulatory Visit: Admitting: Psychiatry

## 2023-11-21 ENCOUNTER — Other Ambulatory Visit: Payer: Self-pay

## 2023-11-21 ENCOUNTER — Encounter: Payer: Self-pay | Admitting: Psychiatry

## 2023-11-21 VITALS — BP 129/73 | HR 71 | Temp 97.2°F | Ht 67.0 in | Wt 151.2 lb

## 2023-11-21 DIAGNOSIS — F3341 Major depressive disorder, recurrent, in partial remission: Secondary | ICD-10-CM

## 2023-11-21 DIAGNOSIS — R419 Unspecified symptoms and signs involving cognitive functions and awareness: Secondary | ICD-10-CM

## 2023-11-21 MED ORDER — ARIPIPRAZOLE 2 MG PO TABS: 2.0000 mg | ORAL_TABLET | Freq: Every day | ORAL | 1 refills | Status: DC

## 2023-11-21 MED ORDER — SERTRALINE HCL 100 MG PO TABS: 100.0000 mg | ORAL_TABLET | Freq: Every day | ORAL | 1 refills | Status: AC

## 2023-11-21 NOTE — Patient Instructions (Signed)
 Continue sertraline  100 mg at night  Continue Abilify  2 mg at night  Obtain lab (folic acid, vitamin B12) at [facility] Next appointment- 7/1 at 3:30

## 2023-12-13 ENCOUNTER — Encounter: Payer: Self-pay | Admitting: Internal Medicine

## 2023-12-13 NOTE — Telephone Encounter (Signed)
 Please review and advise.   JM

## 2023-12-16 ENCOUNTER — Encounter (INDEPENDENT_AMBULATORY_CARE_PROVIDER_SITE_OTHER): Payer: Self-pay

## 2023-12-16 DIAGNOSIS — R451 Restlessness and agitation: Secondary | ICD-10-CM | POA: Diagnosis not present
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
## 2023-12-17 ENCOUNTER — Emergency Department
Admission: EM | Admit: 2023-12-17 | Discharge: 2023-12-27 | Disposition: A | Discharge status: Home / Self Care | Attending: Emergency Medicine | Admitting: Emergency Medicine

## 2023-12-17 DIAGNOSIS — Z79899 Other long term (current) drug therapy: Secondary | ICD-10-CM | POA: Insufficient documentation

## 2023-12-17 DIAGNOSIS — Z9151 Personal history of suicidal behavior: Secondary | ICD-10-CM | POA: Insufficient documentation

## 2023-12-17 DIAGNOSIS — R410 Disorientation, unspecified: Secondary | ICD-10-CM | POA: Insufficient documentation

## 2023-12-17 DIAGNOSIS — F03918 Unspecified dementia, unspecified severity, with other behavioral disturbance: Secondary | ICD-10-CM

## 2023-12-17 DIAGNOSIS — R4182 Altered mental status, unspecified: Secondary | ICD-10-CM | POA: Diagnosis present

## 2023-12-17 DIAGNOSIS — R451 Restlessness and agitation: Secondary | ICD-10-CM | POA: Insufficient documentation

## 2023-12-17 DIAGNOSIS — F01511 Vascular dementia, unspecified severity, with agitation: Secondary | ICD-10-CM | POA: Insufficient documentation

## 2023-12-17 DIAGNOSIS — F01518 Vascular dementia, unspecified severity, with other behavioral disturbance: Secondary | ICD-10-CM | POA: Diagnosis not present

## 2023-12-17 DIAGNOSIS — F0150 Vascular dementia without behavioral disturbance: Secondary | ICD-10-CM | POA: Diagnosis present

## 2023-12-17 LAB — URINE DRUG SCREEN, QUALITATIVE (ARMC ONLY)
Amphetamines, Ur Screen: NOT DETECTED
Barbiturates, Ur Screen: NOT DETECTED
Benzodiazepine, Ur Scrn: NOT DETECTED
Cannabinoid 50 Ng, Ur ~~LOC~~: NOT DETECTED
Cocaine Metabolite,Ur ~~LOC~~: NOT DETECTED
MDMA (Ecstasy)Ur Screen: NOT DETECTED
Methadone Scn, Ur: NOT DETECTED
Opiate, Ur Screen: NOT DETECTED
Phencyclidine (PCP) Ur S: NOT DETECTED
Tricyclic, Ur Screen: NOT DETECTED

## 2023-12-17 LAB — CBC
HCT: 34.8 % — ABNORMAL LOW (ref 36.0–46.0)
Hemoglobin: 11.9 g/dL — ABNORMAL LOW (ref 12.0–15.0)
MCH: 32 pg (ref 26.0–34.0)
MCHC: 34.2 g/dL (ref 30.0–36.0)
MCV: 93.5 fL (ref 80.0–100.0)
Platelets: 241 10*3/uL (ref 150–400)
RBC: 3.72 MIL/uL — ABNORMAL LOW (ref 3.87–5.11)
RDW: 12.9 % (ref 11.5–15.5)
WBC: 7.4 10*3/uL (ref 4.0–10.5)
nRBC: 0 % (ref 0.0–0.2)

## 2023-12-17 LAB — COMPREHENSIVE METABOLIC PANEL WITH GFR
ALT: 25 U/L (ref 0–44)
AST: 27 U/L (ref 15–41)
Albumin: 4.1 g/dL (ref 3.5–5.0)
Alkaline Phosphatase: 48 U/L (ref 38–126)
Anion gap: 10 (ref 5–15)
BUN: 21 mg/dL (ref 8–23)
CO2: 27 mmol/L (ref 22–32)
Calcium: 9 mg/dL (ref 8.9–10.3)
Chloride: 104 mmol/L (ref 98–111)
Creatinine, Ser: 0.97 mg/dL (ref 0.44–1.00)
GFR, Estimated: >60 mL/min (ref >60)
Glucose, Bld: 119 mg/dL — ABNORMAL HIGH (ref 70–99)
Potassium: 3.8 mmol/L (ref 3.5–5.1)
Sodium: 141 mmol/L (ref 135–145)
Total Bilirubin: 0.8 mg/dL (ref 0.0–1.2)
Total Protein: 7.5 g/dL (ref 6.5–8.1)

## 2023-12-17 LAB — URINALYSIS, ROUTINE W REFLEX MICROSCOPIC
Bilirubin Urine: NEGATIVE
Glucose, UA: NEGATIVE mg/dL
Hgb urine dipstick: NEGATIVE
Ketones, ur: NEGATIVE mg/dL
Nitrite: NEGATIVE
Protein, ur: NEGATIVE mg/dL
Specific Gravity, Urine: 1.018 (ref 1.005–1.030)
pH: 7 (ref 5.0–8.0)

## 2023-12-17 LAB — ETHANOL: Alcohol, Ethyl (B): <15 mg/dL (ref <15)

## 2023-12-17 MED ORDER — ASPIRIN 81 MG PO CHEW
81.0000 mg | CHEWABLE_TABLET | Freq: Every day | ORAL | Status: DC
  Administered 2023-12-18 – 2023-12-27 (×10): 81 mg via ORAL
  Filled 2023-12-17 (×10): qty 1

## 2023-12-17 MED ORDER — CARVEDILOL 6.25 MG PO TABS
12.5000 mg | ORAL_TABLET | Freq: Two times a day (BID) | ORAL | Status: DC
  Administered 2023-12-18 – 2023-12-27 (×18): 12.5 mg via ORAL
  Filled 2023-12-17 (×18): qty 2

## 2023-12-17 MED ORDER — SERTRALINE HCL 50 MG PO TABS
100.0000 mg | ORAL_TABLET | Freq: Every day | ORAL | Status: DC
  Administered 2023-12-18 – 2023-12-27 (×10): 100 mg via ORAL
  Filled 2023-12-17 (×10): qty 2

## 2023-12-17 MED ORDER — TICAGRELOR 90 MG PO TABS
90.0000 mg | ORAL_TABLET | Freq: Two times a day (BID) | ORAL | Status: DC
  Administered 2023-12-17 – 2023-12-27 (×20): 90 mg via ORAL
  Filled 2023-12-17 (×20): qty 1

## 2023-12-17 MED ORDER — ARIPIPRAZOLE 2 MG PO TABS
2.0000 mg | ORAL_TABLET | Freq: Every day | ORAL | Status: DC
  Administered 2023-12-18 – 2023-12-19 (×2): 2 mg via ORAL
  Filled 2023-12-17 (×2): qty 1

## 2023-12-17 MED ORDER — ATORVASTATIN CALCIUM 20 MG PO TABS
80.0000 mg | ORAL_TABLET | Freq: Every day | ORAL | Status: DC
  Administered 2023-12-18 – 2023-12-27 (×10): 80 mg via ORAL
  Filled 2023-12-17 (×10): qty 4

## 2023-12-17 MED ORDER — AMLODIPINE BESYLATE 5 MG PO TABS
5.0000 mg | ORAL_TABLET | Freq: Every day | ORAL | Status: DC
  Administered 2023-12-18 – 2023-12-27 (×10): 5 mg via ORAL
  Filled 2023-12-17 (×10): qty 1

## 2023-12-17 MED ORDER — CEPHALEXIN 500 MG PO CAPS
500.0000 mg | ORAL_CAPSULE | Freq: Two times a day (BID) | ORAL | Status: AC
  Administered 2023-12-17 – 2023-12-24 (×14): 500 mg via ORAL
  Filled 2023-12-17 (×14): qty 1

## 2023-12-17 MED ORDER — LOSARTAN POTASSIUM 50 MG PO TABS
50.0000 mg | ORAL_TABLET | Freq: Every day | ORAL | Status: DC
  Administered 2023-12-18 – 2023-12-27 (×10): 50 mg via ORAL
  Filled 2023-12-17 (×10): qty 1

## 2023-12-17 NOTE — BH Assessment (Signed)
 Writer requested IRIS consult for patient to be seen.

## 2023-12-17 NOTE — ED Notes (Signed)
 Psych consult at bedside.

## 2023-12-17 NOTE — ED Notes (Signed)
Pt ambulated to the bathroom and back to bed  
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
## 2023-12-17 NOTE — ED Notes (Signed)
 Daughter at bedside.

## 2023-12-17 NOTE — ED Notes (Signed)
 VOL/  PENDING  CONSULT

## 2023-12-17 NOTE — ED Notes (Addendum)
 Pt dressed out and belongings given to family. Belongings inventoried by this RN and Emilee, EMT and listed below: Purple top Blue jeans Yellow watch Black slippers Floral underwear 2 gray rings One gray necklace  White bra 2 gray earrings

## 2023-12-17 NOTE — ED Notes (Signed)
 Son at bedside.

## 2023-12-17 NOTE — Consult Note (Incomplete)
 [facility] Psychiatric Consult Initial  Patient Name: .Karina Freeman  MRN: [id number]  DOB: October 29, 1950  Consult Order details:  Orders (From admission, onward)     Start     Ordered   12/17/23 1655  CONSULT TO CALL ACT TEAM       Ordering Provider: Claria Crofts, MD  Provider:  (Not yet assigned)  Question:  Reason for Consult?  Answer:  Psych consult   12/17/23 1655   12/17/23 1655  IP CONSULT TO PSYCHIATRY       Ordering Provider: Claria Crofts, MD  Provider:  (Not yet assigned)  Question Answer Comment  Consult Timeframe ROUTINE - requires response within 24 hours   Reason for Consult? Consult for medication management   Contact phone number where the requesting provider can be reached [phone]      12/17/23 1655             Mode of Visit: Tele-visit Virtual Statement:TELE PSYCHIATRY ATTESTATION & CONSENT As the provider for this telehealth consult, I attest that I verified the patient's identity using two separate identifiers, introduced myself to the patient, provided my credentials, disclosed my location, and performed this encounter via a HIPAA-compliant, real-time, face-to-face, two-way, interactive audio and video platform and with the full consent and agreement of the patient (or guardian as applicable.) Patient physical location: [facility]. Telehealth provider physical location: home office in state of [place] .   Video start time:   Video end time:      Psychiatry Consult Evaluation  Service Date: December 17, 2023 LOS:  LOS: 0 days  Chief Complaint Agitation  Primary Psychiatric Diagnoses  *** 2.  *** 3.  ***  Assessment  Karina Freeman is a 73 y.o. female admitted: Presented to the EDfor 12/17/2023  4:15 PM for agitation and behavioral concerns related to her dementia. She carries the psychiatric diagnoses of vascular dementia with behavioral disturbance and has a past medical history of stroke and reported psychiatric treatment for behavioral  symptoms.  Her current presentation of disorganized thinking, poor focus, and agitation is most consistent with neurocognitive disorder with behavioral dysregulation. 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  Current outpatient psychotropic medications include Aripiprazole  (Abilify ) and Sertraline , prescribed by her psychiatrist, and historically she has had a partially effective response to these medications. She was compliant with medications prior to admission however a recent adjustment in dosage was suggested per psychiatrist's recommendation due to increased agitation.  On initial examination, patient was calm but displayed disorganized thought processes and was unable to sustain attention or provide appropriate responses to questions. She denied suicidal ideation, homicidal ideation, and hallucinations.  Diagnoses:  Active Hospital problems: Active Problems:   * No active hospital problems. *    Plan   ## Psychiatric Medication Recommendations:  Sertraline , Abilify   ## Medical Decision Making Capacity: Not specifically addressed in this encounter   ## Disposition:-- Patient will remain overnight for observation  ## Behavioral / Environmental: -{CHLmacbehavioralenvironmental2:31847}    ## Safety and Observation Level:  - Based on my clinical evaluation, I estimate the patient to be at *** risk of self harm in the current setting. - At this time, we recommend  {CHL BH SUICIDE OBSERVATION LEVEL:31850}. This decision is based on my review of the chart including patient's history and current presentation, interview of the patient, mental status examination, and consideration of  suicide risk including evaluating suicidal ideation, plan, intent, suicidal or self-harm behaviors, risk factors, and protective factors. This judgment is  based on our ability to directly address suicide risk, implement suicide prevention strategies, and develop a safety plan while the patient is in the clinical setting. Please contact our team if there is a concern that risk level has changed.  CSSR Risk Category:C-SSRS RISK CATEGORY: No Risk  Suicide Risk Assessment: Patient has following modifiable risk factors for suicide: {CHLmacmodifiablesuicideriskfactors:31822}, which we are addressing by ***. Patient has following non-modifiable or demographic risk factors for suicide: {CHLmacnonmodifiablesuicideriskfactors:31823} Patient has the following protective factors against suicide: {CHLmacprotectivefactors:31824}  Thank you for this consult request. Recommendations have been communicated to the primary team.  We will *** at this time.   Karina Sorbo, NP       History of Present Illness  Relevant Aspects of Hospital [facility] or ED course:31819} Course:  Admitted on 12/17/2023 for ***. They ***.   Patient Report:  ***  Psych ROS:  Depression: *** Anxiety:  *** Mania (lifetime and current): *** Psychosis: (lifetime and current): ***  Collateral information:  Contacted *** at *** on ***  Review of Systems  Constitutional: Negative.   HENT: Negative.    Eyes: Negative.   Respiratory: Negative.    Cardiovascular: Negative.   Gastrointestinal: Negative.   Genitourinary: Negative.   Musculoskeletal: Negative.   Skin: Negative.   Neurological: Negative.      Psychiatric and Social History  Psychiatric History:  Information collected from ***  Prev Dx/Sx: *** Current Psych Provider: *** Home Meds (current): *** Previous Med Trials: *** Therapy: ***  Prior Psych Hospitalization: ***  Prior Self Harm: *** Prior Violence: ***  Family Psych History: *** Family Hx suicide: ***  Social History:  Developmental Hx: *** Educational Hx: *** Occupational Hx: *** Legal Hx: *** Living Situation: *** Spiritual  Hx: *** Access to weapons/lethal means: ***   Substance History Alcohol: ***  Type of alcohol *** Last Drink *** Number of drinks per day *** History of alcohol withdrawal seizures *** History of DT's *** Tobacco: *** Illicit drugs: *** Prescription drug abuse: *** Rehab hx: ***  Exam Findings  Physical Exam: *** Vital Signs:  Temp:  [98.4 F (36.9 C)] 98.4 F (36.9 C) (06/03 1525) Pulse Rate:  [71] 71 (06/03 1525) Resp:  [18] 18 (06/03 1525) BP: (148)/(66) 148/66 (06/03 1525) SpO2:  [100 %] 100 % (06/03 1525) Weight:  [68 kg] 68 kg (06/03 1526) Blood pressure (!) 148/66, pulse 71, temperature 98.4 F (36.9 C), temperature source Oral, resp. rate 18, height 5\' 7"  (1.702 m), weight 68 kg, SpO2 100%. Body mass index is 23.48 kg/m.  Physical Exam HENT:     Head: Normocephalic.     Nose: Nose normal.     Mouth/Throat:     Pharynx: Oropharynx is clear.  Pulmonary:     Effort: Pulmonary effort is normal.  Musculoskeletal:        General: Normal range of motion.     Cervical back: Normal range of motion.  Skin:    General: Skin is dry.  Neurological:     Mental Status: She is alert.     Mental Status Exam: General Appearance: {Appearance:22683}  Orientation:  {BHH ORIENTATION (PAA):22689}  Memory:  {BHH MEMORY:22881}  Concentration:  {Concentration:21399}  Recall:  {BHH GOOD/FAIR/POOR:22877}  Attention  {BH Attention Span:31825}  Eye Contact:  {BHH EYE CONTACT:22684}  Speech:  {Speech:22685}  Language:  {BHH GOOD/FAIR/POOR:22877}  Volume:  {Volume (PAA):22686}  Mood: ***  Affect:  {Affect (PAA):22687}  Thought Process:  {Thought Process (PAA):22688}  Thought Content:  {Thought Content:22690}  Suicidal Thoughts:  {ST/HT (PAA):22692}  Homicidal Thoughts:  {ST/HT (PAA):22692}  Judgement:  {Judgement (PAA):22694}  Insight:  {Insight (PAA):22695}  Psychomotor Activity:  {Psychomotor (PAA):22696}  Akathisia:  {BHH YES OR NO:22294}  Fund of Knowledge:  {BHH  GOOD/FAIR/POOR:22877}      Assets:  {Assets (PAA):22698}  Cognition:  {chl bhh cognition:304700322}  ADL's:  {BHH WGN'F:62130}  AIMS (if indicated):        Other History   These have been pulled in through the EMR, reviewed, and updated if appropriate.  Family History:  The patient's family history includes Bipolar disorder in her brother; Cancer in her mother; Heart disease in her brother, father, mother, and sister.  Medical History: Past Medical History:  Diagnosis Date  . Abnormal Pap smear of cervix 11/11/2015  . Acute encephalopathy 08/17/2023  . Acute HFrEF (heart failure with reduced ejection fraction) (HCC) 02/22/2023  . Acute ST elevation myocardial infarction (STEMI) of anterior wall (HCC) 02/15/2023  . Anxiety   . CVA (cerebral infarction)   . Depressive disorder   . GERD (gastroesophageal reflux disease)   . Hyperlipidemia   . Hypertension   . IFG (impaired fasting glucose)   . Osteoporosis   . ST elevation myocardial infarction (STEMI) (HCC) 02/17/2023    Surgical History: Past Surgical History:  Procedure Laterality Date  . APPENDECTOMY    . CORONARY IMAGING/OCT N/A 02/21/2023   Procedure: CORONARY IMAGING/OCT;  Surgeon: Sammy Crisp, MD;  Location: [facility] INVASIVE CV LAB;  Service: Cardiovascular;  Laterality: N/A;  . CORONARY PRESSURE/FFR STUDY N/A 02/21/2023   Procedure: CORONARY PRESSURE/FFR STUDY;  Surgeon: Sammy Crisp, MD;  Location: [facility] CV [facility];  Service: Cardiovascular;  Laterality: N/A;  . CORONARY STENT INTERVENTION N/A 02/21/2023   Procedure: CORONARY STENT INTERVENTION;  Surgeon: Sammy Crisp, MD;  Location: [facility];  Service: Cardiovascular;  Laterality: N/A;  . CORONARY/GRAFT ACUTE MI REVASCULARIZATION N/A 02/15/2023   Procedure: Coronary/Graft Acute MI Revascularization;  Surgeon: Wenona Hamilton, MD;  Location: [facility];  Service: Cardiovascular;  Laterality: N/A;  . LEFT HEART CATH AND CORONARY  ANGIOGRAPHY N/A 02/15/2023   Procedure: LEFT HEART CATH AND CORONARY ANGIOGRAPHY;  Surgeon: Wenona Hamilton, MD;  Location: [facility];  Service: Cardiovascular;  Laterality: N/A;  . LEFT HEART CATH AND CORONARY ANGIOGRAPHY N/A 02/21/2023   Procedure: LEFT HEART CATH AND CORONARY ANGIOGRAPHY;  Surgeon: Sammy Crisp, MD;  Location: [facility];  Service: Cardiovascular;  Laterality: N/A;  . TONSILLECTOMY    . TUBAL LIGATION       Medications:  No current facility-administered medications for this encounter.  Current Outpatient Medications:  .  amLODipine  (NORVASC ) 5 MG tablet, Take 1 tablet (5 mg total) by mouth at bedtime., Disp: 90 tablet, Rfl: 3 .  ARIPiprazole  (ABILIFY ) 2 MG tablet, Take 1 tablet (2 mg total) by mouth daily., Disp: 30 tablet, Rfl: 1 .  aspirin  81 MG chewable tablet, Chew 1 tablet (81 mg total) by mouth daily., Disp: 90 tablet, Rfl: 3 .  atorvastatin  (LIPITOR ) 80 MG tablet, Take 1 tablet (80 mg total) by mouth daily., Disp: 90 tablet, Rfl: 3 .  carvedilol  (COREG ) 12.5 MG tablet, Take 1 tablet (12.5 mg total) by mouth 2 (two) times daily with a meal., Disp: 180 tablet, Rfl: 3 .  losartan  (COZAAR ) 50 MG tablet,  Take 1 tablet (50 mg total) by mouth daily., Disp: 90 tablet, Rfl: 3 .  sertraline  (ZOLOFT ) 100 MG tablet, Take 1 tablet (100 mg total) by mouth daily., Disp: 30 tablet, Rfl: 1 .  ticagrelor  (BRILINTA ) 90 MG TABS tablet, Take 1 tablet (90 mg total) by mouth 2 (two) times daily., Disp: 180 tablet, Rfl: 1 .  feeding supplement (ENSURE ENLIVE / ENSURE PLUS) LIQD, Take 237 mLs by mouth 2 (two) times daily between meals., Disp: 237 mL, Rfl: 12 .  nitroGLYCERIN  (NITROSTAT ) 0.4 MG SL tablet, Place 1 tablet (0.4 mg total) under the tongue every 5 (five) minutes as needed for chest pain., Disp: 100 tablet, Rfl: 3  Allergies: Allergies  Allergen Reactions  . Citalopram Hydrobromide Nausea And Vomiting  . Codeine Diarrhea and Nausea And Vomiting  .  Penicillin G Benzathine     Desteni Piscopo, NP

## 2023-12-17 NOTE — ED Notes (Signed)
 Daughter left for the evening. Will be back in the morning. Social work is supposed to be following up with pt tomorrow as well.

## 2023-12-17 NOTE — ED Notes (Signed)
Pt ambulated to bathroom and back to room.

## 2023-12-17 NOTE — ED Notes (Signed)
Pt given water and ice cream

## 2023-12-17 NOTE — ED Notes (Signed)
 Pt and daughter updated about change in time for psych appt.

## 2023-12-17 NOTE — ED Notes (Signed)
 Daughter at bedside for appt with the psych provider.
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
## 2023-12-18 MED ORDER — MELATONIN 5 MG PO TABS
2.5000 mg | ORAL_TABLET | Freq: Every evening | ORAL | Status: DC | PRN
  Administered 2023-12-18 – 2023-12-24 (×3): 2.5 mg via ORAL
  Filled 2023-12-18 (×3): qty 1

## 2023-12-18 NOTE — ED Notes (Signed)
 Pt ambulated to bathroom and back to bed.

## 2023-12-18 NOTE — ED Provider Notes (Signed)
-----------------------------------------   2:51 PM on 12/18/2023 -----------------------------------------  RN [place]ite from case management has talked with the patient and family and is concerned that the patient may still be a danger to self and others.  She has attempted to kill herself twice over the last several months, may suicidal statements to family, and made threats against her husband.  The RN requested that we consult psychiatry to reevaluate the patient.  The consult order is in.   Lind Repine, MD 12/18/23 1452

## 2023-12-18 NOTE — ED Notes (Signed)
 Pt given graham crackers with peanut butter and vanilla ice cream for snack.

## 2023-12-18 NOTE — ED Notes (Signed)
 Vol /TOC

## 2023-12-18 NOTE — ED Notes (Signed)
 Pt restless and standing outside room. Adriana Hopping, EDT walking with pt around department.

## 2023-12-18 NOTE — ED Provider Notes (Signed)
-----------------------------------------   6:21 AM on 12/18/2023 -----------------------------------------   Blood pressure 125/79, pulse 66, temperature 97.7 F (36.5 C), temperature source Oral, resp. rate 16, height 1.702 m (5\' 7" ), weight 68 kg, SpO2 96%.  The patient is calm and cooperative at this time.  The patient has been evaluated by psychiatry and they have cleared her from the psychiatric service.  However, the daughter voiced serious concerns about the patient's safety at home and psychiatry feels the patient would benefit from [facility] consult and placement.  The behavioral health provider put in the order for [facility] consult and I have made the patient a TOC border in the ED.  Med reconciliation is complete and that medication orders have been placed.   Lynnda Sas, MD 12/18/23 [phone]

## 2023-12-18 NOTE — ED Notes (Signed)
 Pt given water per request
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
## 2023-12-19 ENCOUNTER — Telehealth: Payer: Self-pay | Admitting: Psychiatry

## 2023-12-19 LAB — URINE CULTURE

## 2023-12-19 MED ORDER — ARIPIPRAZOLE 2 MG PO TABS
4.0000 mg | ORAL_TABLET | Freq: Every day | ORAL | Status: DC
  Administered 2023-12-20 – 2023-12-27 (×8): 4 mg via ORAL
  Filled 2023-12-19 (×8): qty 2

## 2023-12-19 NOTE — ED Notes (Signed)
Pt declined snack.  

## 2023-12-19 NOTE — ED Notes (Signed)
 Pt has returned to hallway and continues to stand in doorway.

## 2023-12-19 NOTE — Progress Notes (Signed)
   12/19/23 1530  Spiritual Encounters  Type of Visit Initial  Care provided to: Patient  Conversation partners present during encounter Nurse  Reason for visit Routine spiritual support  OnCall Visit Yes   Chaplain's colleague shared that patient wanted a soft back Bible.  Chaplain bought the Bible to the patient and engaged her about favorite Books of the [place] and favorite Biblical characters.  Patient was joyful and satisfied having received the Bible and shared she didn't have any additional spiritual care needs at this time.    Rev. Rana M. Nolon Baxter, M.Div. Chaplain Residents  [facility]

## 2023-12-19 NOTE — Telephone Encounter (Signed)
 Please note that since communication with her husband on 6/2, it was recommended to increase Abilify  dose to 4 mg daily. Her husband also wished to relay this medication change to the primary team. I have communicated this update to Dr. Peggi Bowels, ED provider via chat message. I will defer further management to the primary team/psychiatry team.

## 2023-12-19 NOTE — ED Notes (Signed)
 Pt's husband called to relay he has not heard from anyone regarding Medicaid or placement options.  Plans to be here tomorrow but requested that someone reach out to him.

## 2023-12-19 NOTE — ED Notes (Signed)
 Pt reading bible provided by chaplain. Declines taking BP meds at this time.

## 2023-12-19 NOTE — TOC Progression Note (Addendum)
 Transition of Care [facility]) - Progression Note    Patient Details  Name: Karina Freeman MRN: [id number] Date of Birth: 1951/01/28  Transition of Care [facility]) CM/SW Contact  Arminda Landmark, RN Phone Number: 12/19/2023, 1:29 PM  Clinical Narrative:    Spoke with her spouse [name] and let him know we're waiting for her psychiatric evaluation. He hasn't heard from the financial navigator Vonita Guan so asked Artie Bimler. to reach out to him. [name] states her usual psychiatrist is Todd Fossa and is across the street from this hospital. [name] wants to make sure she's receiving the correct dose of abilify  so sent an Epic chat to her RN and she's on the correct dose of 4mg . Awaiting psych consult to determine the appropriate next LOC. TOC to continue to follow.    Expected Discharge Plan: Memory Care Barriers to Discharge: Financial Resources, Unsafe home situation  Expected Discharge Plan and Services In-house Referral: Financial Counselor Discharge Planning Services: CM Consult   Living arrangements for the past 2 months: Single Family Home                                       Social Determinants of Health (SDOH) Interventions SDOH Screenings   Food Insecurity: No Food Insecurity (10/11/2023)  Housing: Low Risk  (10/11/2023)  Transportation Needs: No Transportation Needs (08/30/2023)  Utilities: Not At Risk (10/11/2023)  Alcohol Screen: Low Risk  (08/03/2023)  Depression (PHQ2-9): Low Risk  (10/11/2023)  Social Connections: Moderately Integrated (08/03/2023)  Tobacco Use: Medium Risk (12/17/2023)    Readmission Risk Interventions     No data to display

## 2023-12-19 NOTE — ED Notes (Signed)
 This tech obtained vital signs on pt.

## 2023-12-19 NOTE — ED Provider Notes (Signed)
 I was messaged by patient's outpatient provider Dr Edda Goo- that they wanted Abilify  to increase to 4mg  recently- we have alerted TTS calvin and I made changes but want the ED psych team to be notified. She also placed a telephone note.    Lubertha Rush, MD 12/19/23 [phone]

## 2023-12-19 NOTE — ED Provider Notes (Signed)
 Emergency Medicine Observation Re-evaluation Note  Physical Exam   BP 110/63 (BP Location: Right Arm)   Pulse 68   Temp 97.6 F (36.4 C) (Oral)   Resp 17   Ht 5\' 7"  (1.702 m)   Wt 68 kg   LMP  (LMP Unknown)   SpO2 100%   BMI 23.48 kg/m   Patient appears in no acute distress.  ED Course / MDM   No reported events during my shift at the time of this note.   Pt is awaiting dispo from consultants   Buell Carmin MD    Buell Carmin, MD 12/19/23 [phone]

## 2023-12-20 DIAGNOSIS — F03918 Unspecified dementia, unspecified severity, with other behavioral disturbance: Secondary | ICD-10-CM | POA: Diagnosis not present

## 2023-12-20 NOTE — Progress Notes (Deleted)
 BH MD/PA/NP OP Progress Note  12/20/2023 5:32 AM Karina Freeman  MRN:  [id number]  Chief Complaint: No chief complaint on file.  HPI: *** Visit Diagnosis: No diagnosis found.  Past Psychiatric History: ***  Past Medical History:  Past Medical History:  Diagnosis Date  . Abnormal Pap smear of cervix 11/11/2015  . Acute encephalopathy 08/17/2023  . Acute HFrEF (heart failure with reduced ejection fraction) (HCC) 02/22/2023  . Acute ST elevation myocardial infarction (STEMI) of anterior wall (HCC) 02/15/2023  . Anxiety   . CVA (cerebral infarction)   . Depressive disorder   . GERD (gastroesophageal reflux disease)   . Hyperlipidemia   . Hypertension   . IFG (impaired fasting glucose)   . Osteoporosis   . ST elevation myocardial infarction (STEMI) (HCC) 02/17/2023    Past Surgical History:  Procedure Laterality Date  . APPENDECTOMY    . CORONARY IMAGING/OCT N/A 02/21/2023   Procedure: CORONARY IMAGING/OCT;  Surgeon: Sammy Crisp, MD;  Location: [facility] INVASIVE CV LAB;  Service: Cardiovascular;  Laterality: N/A;  . CORONARY PRESSURE/FFR STUDY N/A 02/21/2023   Procedure: CORONARY PRESSURE/FFR STUDY;  Surgeon: Sammy Crisp, MD;  Location: [facility] INVASIVE CV LAB;  Service: Cardiovascular;  Laterality: N/A;  . CORONARY STENT INTERVENTION N/A 02/21/2023   Procedure: CORONARY STENT INTERVENTION;  Surgeon: Sammy Crisp, MD;  Location: [facility] INVASIVE CV LAB;  Service: Cardiovascular;  Laterality: N/A;  . CORONARY/GRAFT ACUTE MI REVASCULARIZATION N/A 02/15/2023   Procedure: Coronary/Graft Acute MI Revascularization;  Surgeon: Wenona Hamilton, MD;  Location: [facility] INVASIVE CV LAB;  Service: Cardiovascular;  Laterality: N/A;  . LEFT HEART CATH AND CORONARY ANGIOGRAPHY N/A 02/15/2023   Procedure: LEFT HEART CATH AND CORONARY ANGIOGRAPHY;  Surgeon: Wenona Hamilton, MD;  Location: [facility] INVASIVE CV LAB;  Service: Cardiovascular;  Laterality: N/A;  . LEFT HEART CATH AND CORONARY ANGIOGRAPHY N/A  02/21/2023   Procedure: LEFT HEART CATH AND CORONARY ANGIOGRAPHY;  Surgeon: Sammy Crisp, MD;  Location: [facility] INVASIVE CV LAB;  Service: Cardiovascular;  Laterality: N/A;  . TONSILLECTOMY    . TUBAL LIGATION      Family Psychiatric History: ***  Family History:  Family History  Problem Relation Age of Onset  . Heart disease Mother   . Cancer Mother        breast  . Heart disease Father   . Heart disease Sister   . Heart disease Brother   . Bipolar disorder Brother   . Breast cancer Neg Hx     Social History:  Social History   Socioeconomic History  . Marital status: Married    Spouse name: [name]  . Number of children: 3  . Years of education: Not on file  . Highest education level: GED or equivalent  Occupational History  . Not on file  Tobacco Use  . Smoking status: Former    Current packs/day: 0.00    Types: Cigarettes    Quit date: 07/16/1973    Years since quitting: 50.4  . Smokeless tobacco: Never  Vaping Use  . Vaping status: Never Used  Substance and Sexual Activity  . Alcohol use: No    Alcohol/week: 0.0 standard drinks of alcohol  . Drug use: No  . Sexual activity: Yes    Birth control/protection: None  Other Topics Concern  . Not on file  Social History Narrative  . Not on file   Social Drivers of Health   Financial Resource Strain: Not on file  Food Insecurity: No Food Insecurity (10/11/2023)  Hunger Vital Sign   . Worried About Programme researcher, broadcasting/film/video in the Last Year: Never true   . Ran Out of Food in the Last Year: Never true  Transportation Needs: No Transportation Needs (08/30/2023)   PRAPARE - Transportation   . Lack of Transportation (Medical): No   . Lack of Transportation (Non-Medical): No  Physical Activity: Not on file  Stress: Not on file  Social Connections: Moderately Integrated (08/03/2023)   Social Connection and Isolation Panel [NHANES]   . Frequency of Communication with Friends and Family: Once a week   . Frequency of Social  Gatherings with Friends and Family: Once a week   . Attends Religious Services: 1 to 4 times per year   . Active Member of Clubs or Organizations: Yes   . Attends Banker Meetings: 1 to 4 times per year   . Marital Status: Married    Allergies:  Allergies  Allergen Reactions  . Citalopram Hydrobromide Nausea And Vomiting  . Codeine Diarrhea and Nausea And Vomiting  . Penicillin G Benzathine     Metabolic Disorder Labs: Lab Results  Component Value Date   HGBA1C 5.4 02/15/2023   MPG 108.28 02/15/2023   No results found for: "PROLACTIN" Lab Results  Component Value Date   CHOL 125 03/14/2023   TRIG 86 03/14/2023   HDL 52 03/14/2023   CHOLHDL 2.4 03/14/2023   VLDL 17 02/15/2023   LDLCALC 56 03/14/2023   LDLCALC 164 (H) 02/15/2023   Lab Results  Component Value Date   TSH 1.050 03/26/2023    Therapeutic Level Labs: No results found for: "LITHIUM" No results found for: "VALPROATE" No results found for: "CBMZ"  Current Medications: No current facility-administered medications for this visit.   Current Outpatient Medications  Medication Sig Dispense Refill  . amLODipine  (NORVASC ) 5 MG tablet Take 1 tablet (5 mg total) by mouth at bedtime. 90 tablet 3  . ARIPiprazole  (ABILIFY ) 2 MG tablet Take 1 tablet (2 mg total) by mouth daily. 30 tablet 1  . aspirin  81 MG chewable tablet Chew 1 tablet (81 mg total) by mouth daily. 90 tablet 3  . atorvastatin  (LIPITOR ) 80 MG tablet Take 1 tablet (80 mg total) by mouth daily. 90 tablet 3  . carvedilol  (COREG ) 12.5 MG tablet Take 1 tablet (12.5 mg total) by mouth 2 (two) times daily with a meal. 180 tablet 3  . feeding supplement (ENSURE ENLIVE / ENSURE PLUS) LIQD Take 237 mLs by mouth 2 (two) times daily between meals. 237 mL 12  . losartan  (COZAAR ) 50 MG tablet Take 1 tablet (50 mg total) by mouth daily. 90 tablet 3  . nitroGLYCERIN  (NITROSTAT ) 0.4 MG SL tablet Place 1 tablet (0.4 mg total) under the tongue every 5 (five)  minutes as needed for chest pain. 100 tablet 3  . sertraline  (ZOLOFT ) 100 MG tablet Take 1 tablet (100 mg total) by mouth daily. 30 tablet 1  . ticagrelor  (BRILINTA ) 90 MG TABS tablet Take 1 tablet (90 mg total) by mouth 2 (two) times daily. 180 tablet 1   Facility-Administered Medications Ordered in Other Visits  Medication Dose Route Frequency Provider Last Rate Last Admin  . amLODipine  (NORVASC ) tablet 5 mg  5 mg Oral Daily Ray, Neha, MD   5 mg at 12/19/23 0916  . ARIPiprazole  (ABILIFY ) tablet 4 mg  4 mg Oral Daily Lubertha Rush, MD      . aspirin  chewable tablet 81 mg  81 mg Oral Daily Ray, Neha,  MD   81 mg at 12/19/23 0916  . atorvastatin  (LIPITOR ) tablet 80 mg  80 mg Oral Daily Claria Crofts, MD   80 mg at 12/19/23 0916  . carvedilol  (COREG ) tablet 12.5 mg  12.5 mg Oral BID WC Claria Crofts, MD   12.5 mg at 12/19/23 0916  . cephALEXin (KEFLEX) capsule 500 mg  500 mg Oral BID Claria Crofts, MD   500 mg at 12/19/23 2124  . losartan  (COZAAR ) tablet 50 mg  50 mg Oral Daily Claria Crofts, MD   50 mg at 12/19/23 0916  . melatonin tablet 2.5 mg  2.5 mg Oral QHS PRN Tan, Ting Xu, MD   2.5 mg at 12/18/23 2154  . sertraline  (ZOLOFT ) tablet 100 mg  100 mg Oral Daily Ray, Neha, MD   100 mg at 12/19/23 0102  . ticagrelor  (BRILINTA ) tablet 90 mg  90 mg Oral BID Claria Crofts, MD   90 mg at 12/19/23 2124     Musculoskeletal: Strength & Muscle Tone: {desc; muscle tone:32375} Gait & Station: {PE GAIT ED VOZD:66440} Patient leans: {Patient Leans:21022755}  Psychiatric Specialty Exam: Review of Systems  There were no vitals taken for this visit.There is no height or weight on file to calculate BMI.  General Appearance: {Appearance:22683}  Eye Contact:  {BHH EYE CONTACT:22684}  Speech:  {Speech:22685}  Volume:  {Volume (PAA):22686}  Mood:  {BHH MOOD:22306}  Affect:  {Affect (PAA):22687}  Thought Process:  {Thought Process (PAA):22688}  Orientation:  {BHH ORIENTATION (PAA):22689}  Thought Content: {Thought  Content:22690}   Suicidal Thoughts:  {ST/HT (PAA):22692}  Homicidal Thoughts:  {ST/HT (PAA):22692}  Memory:  {BHH MEMORY:22881}  Judgement:  {Judgement (PAA):22694}  Insight:  {Insight (PAA):22695}  Psychomotor Activity:  {Psychomotor (PAA):22696}  Concentration:  {Concentration:21399}  Recall:  {BHH GOOD/FAIR/POOR:22877}  Fund of Knowledge: {BHH GOOD/FAIR/POOR:22877}  Language: {BHH GOOD/FAIR/POOR:22877}  Akathisia:  {BHH YES OR NO:22294}  Handed:  {Handed:22697}  AIMS (if indicated): {Desc; done/not:10129}  Assets:  {Assets (PAA):22698}  ADL's:  {BHH HKV'Q:25956}  Cognition: {chl bhh cognition:304700322}  Sleep:  {BHH GOOD/FAIR/POOR:22877}   Screenings: AUDIT    Flowsheet Row Admission (Discharged) from 08/03/2023 in [facility] BEHAVIORAL [facility]INE  Alcohol Use Disorder Identification Test Final Score (AUDIT) 0      GAD-7    Flowsheet Row Office Visit from 09/25/2023 in [facility] Primary Care & Sports Medicine at [facility] Office Visit from 05/28/2023 in [facility] Primary Care & Sports Medicine at [facility] Office Visit from 03/26/2023 in [facility] Primary Care & Sports Medicine at [facility]  Total GAD-7 Score 1 0 4      PHQ2-9    Flowsheet Row Office Visit from 10/11/2023 in [facility] Of [facility] Office Visit from 09/25/2023 in [facility] Care & Sports Medicine at [facility] Office Visit from 05/28/2023 in [facility] Primary Care & Sports Medicine at [facility] Cardiac Re[facility] from 04/04/2023 in [facility] Cardiac and Pulmonary Rehab Office Visit from 03/26/2023 in [facility] Primary Care & Sports Medicine at [facility]  PHQ-2 Total Score 0 0 0 0 1  PHQ-9 Total Score 0 3 0 3 5      Flowsheet Row ED from 12/17/2023 in [facility] Emergency Department at [facility] Admission (Discharged) from 08/03/2023 in [facility] BEHAVIORAL MEDICINE ED from 08/02/2023 in [facility]  Emergency Department at [facility]  C-SSRS RISK CATEGORY No Risk High Risk High Risk  Assessment and Plan: ***  Collaboration of Care: Collaboration of Care: {BH OP Collaboration of Care:21014065}  Patient/Guardian was advised Release of Information must be obtained prior to any record release in order to collaborate their care with an outside provider. Patient/Guardian was advised if they have not already done so to contact the registration department to sign all necessary forms in order for us  to release information regarding their care.   Consent: Patient/Guardian gives verbal consent for treatment and assignment of benefits for services provided during this visit. Patient/Guardian expressed understanding and agreed to proceed.    Todd Fossa, MD 12/20/2023, 5:32 AM

## 2023-12-20 NOTE — ED Notes (Signed)
Pt given breakfast tray and beverage.  
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
## 2023-12-20 NOTE — ED Notes (Signed)
Pt provided with dinner tray at this time.

## 2023-12-20 NOTE — ED Notes (Addendum)
 Son and daughter visiting with pt. Family voiced frustration to this RN over the length of time pt has been here and receiving little update. Family expressed desire to d/c her to go somewhere else if nothing was being done for pt. This RN informed family that case manger was reaching out to psych team today but this RN has not heard more updates. This RN told family pt was safe and had a good day, that she was not in distress d/t room conditions. Pt has been eating her meals and reading her Bible. This RN reached out to case management again for any updates to give to family.

## 2023-12-20 NOTE — ED Provider Notes (Signed)
-----------------------------------------   5:22 AM on 12/20/2023 -----------------------------------------   Blood pressure (!) 162/73, pulse 61, temperature 98.4 F (36.9 C), temperature source Oral, resp. rate 18, height 5\' 7"  (1.702 m), weight 68 kg, SpO2 98%.  The patient is calm and cooperative at this time.  There have been no acute events since the last update.  Awaiting disposition plan from Social Work team.   Caydee Talkington J, MD 12/20/23 [phone]

## 2023-12-20 NOTE — TOC Progression Note (Signed)
 Transition of Care [facility]) - Progression Note    Patient Details  Name: Karina Freeman MRN: [id number] Date of Birth: 09-29-1950  [facility]) CM/SW Contact  Arminda Landmark, RN Phone Number: 12/20/2023, 12:47 PM  Clinical Narrative:    Patient has not had her 2nd evaluation by the psychiatrist yet. Her spouse Karina Freeman called and said he's looked up information about medicaid and believes [name] will qualify. He would like assistance from one of our financial navigators but they haven't reached out to him yet. Sent Epic chat to Aisha Hove to please contact Karina Freeman and gave her his cell #. TOC to continue to follow.    Expected Discharge Plan: Memory Care Barriers to Discharge: Financial Resources, Unsafe home situation  Expected Discharge Plan and Services In-house Referral: Financial Counselor Discharge Planning Services: CM Consult   Living arrangements for the past 2 months: Single Family Home                                       Social Determinants of Health (SDOH) Interventions SDOH Screenings   Food Insecurity: No Food Insecurity (10/11/2023)  Housing: Low Risk  (10/11/2023)  Transportation Needs: No Transportation Needs (08/30/2023)  Utilities: Not At Risk (10/11/2023)  Alcohol Screen: Low Risk  (08/03/2023)  Depression (PHQ2-9): Low Risk  (10/11/2023)  Social Connections: Moderately Integrated (08/03/2023)  Tobacco Use: Medium Risk (12/17/2023)    Readmission Risk Interventions     No data to display

## 2023-12-20 NOTE — ED Notes (Signed)
 Pt given new set of hospital scrubs to change into per request.

## 2023-12-21 NOTE — ED Notes (Signed)
Pt received dinner tray.

## 2023-12-21 NOTE — ED Notes (Signed)
 vol/toc.Marland KitchenMarland KitchenMarland Kitchen

## 2023-12-21 NOTE — ED Notes (Signed)
Patient is currently showering.

## 2023-12-21 NOTE — ED Notes (Signed)
Provided pt with snack tray at this time. 

## 2023-12-21 NOTE — TOC Progression Note (Signed)
 Transition of Care [facility]) - Progression Note    Patient Details  Name: Karina Freeman MRN: [id number] Date of Birth: 12-Aug-1950  [facility]) CM/SW Contact  [place] L Jake Fuhrmann, [place] Phone Number: 12/21/2023, 1:28 PM  Clinical Narrative:     CSW spoke with pt daughter, T. Aloha Jakes. [name] advised that the family was wanting to place pt for safety reasons in an ALF or MCU. CSW advised that pt has Medicare and there will be a co-pay. CSW will contact [facility] financial advisor to consult with the family. [name] advised that she had planned on speaking with an elder attorney.   CSW emailed Ms. Barham resources for ALF and MCU. [name] was also provided a resource for [facility] [employer]. CSW urged family to make a decision as pt has been in the ED for three days and she has been medically and psychiatrically cleared. CSW will follow-up on Monday.    Expected Discharge Plan: Memory Care Barriers to Discharge: Financial Resources, Unsafe home situation  Expected Discharge Plan and Services In-house Referral: Financial Counselor Discharge Planning Services: CM Consult   Living arrangements for the past 2 months: Single Family Home                                       Social Determinants of Health (SDOH) Interventions SDOH Screenings   Food Insecurity: No Food Insecurity (10/11/2023)  Housing: Low Risk  (10/11/2023)  Transportation Needs: No Transportation Needs (08/30/2023)  Utilities: Not At Risk (10/11/2023)  Alcohol Screen: Low Risk  (08/03/2023)  Depression (PHQ2-9): Low Risk  (10/11/2023)  Social Connections: Moderately Integrated (08/03/2023)  Tobacco Use: Medium Risk (12/17/2023)    Readmission Risk Interventions     No data to display

## 2023-12-21 NOTE — ED Notes (Signed)
Pt received meal tray and beverage at this time. 

## 2023-12-21 NOTE — ED Provider Notes (Signed)
-----------------------------------------   5:30 AM on 12/21/2023 -----------------------------------------   Blood pressure (!) 144/70, pulse 77, temperature 97.7 F (36.5 C), temperature source Oral, resp. rate 17, height 5\' 7"  (1.702 m), weight 68 kg, SpO2 99%.  The patient is calm and cooperative at this time.  There have been no acute events since the last update.  Awaiting disposition plan from Social Work team.   Ramsey Midgett J, MD 12/21/23 0530

## 2023-12-21 NOTE — ED Notes (Signed)
 VOL  TOC  PLACEMENT

## 2023-12-21 NOTE — ED Notes (Signed)
 Pt given shower supplies and is taking a shower at this time.

## 2023-12-22 NOTE — ED Notes (Signed)
Patient is vol pending TOC placement 

## 2023-12-22 NOTE — ED Notes (Signed)
 VOL/TOC Placement

## 2023-12-22 NOTE — ED Notes (Signed)
 Gave pt a new pair of hospital socks.

## 2023-12-22 NOTE — ED Provider Notes (Signed)
-----------------------------------------   5:51 AM on 12/22/2023 -----------------------------------------   Blood pressure 131/70, pulse 67, temperature 98.1 F (36.7 C), temperature source Oral, resp. rate 17, height 5\' 7"  (1.702 m), weight 68 kg, SpO2 99%.  The patient is calm and cooperative at this time.  There have been no acute events since the last update.  Awaiting disposition plan from case management/social work.    Zuria Fosdick, Clover Dao, DO 12/22/23 [phone]
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
## 2023-12-22 NOTE — ED Notes (Signed)
 Pt breakfast tray and beverage left at bedside.

## 2023-12-22 NOTE — ED Notes (Signed)
 Provided pt with dinner tray at this time and a cup of water.

## 2023-12-22 NOTE — ED Notes (Signed)
 Spouse called Nurses station and requested to speak to a nurse that was helping take care of his wife. He requested an update about the plan of care and financial situation. RN explained the POC based on the social work and case managers notes in the chart. Patient still stated that social work or case manager had not called him to update. Husband requested that a message be sent to social work and Sports coach. This RN sent message to case worker, Social work, and Radio producer.

## 2023-12-22 NOTE — ED Notes (Signed)
 Pt went in bathroom and took of scrub top and bottoms said that "everything was wet" this Clinical research associate and RN Prentice Brochure went and assisted pt . Pt was given all new scrubs top and bottoms underwear, socks, and bath wipes

## 2023-12-22 NOTE — ED Notes (Signed)
 Pt given lunch tray.

## 2023-12-22 NOTE — ED Notes (Signed)
Pt received snack and water. 

## 2023-12-23 NOTE — ED Notes (Signed)
 Snack given by this tech

## 2023-12-23 NOTE — Evaluation (Signed)
 Occupational Therapy Evaluation Patient Details Name: Karina Freeman MRN: [id number] DOB: Nov 25, 1950 Today's Date: 12/23/2023   History of Present Illness   Karina Freeman is a 73 year old female with history of vascular dementia presenting to the emergency department for evaluation of agitation. History of inpatient psych admission 08/03/23-08/28/23 after which pt discharged home with spouse.   Clinical Impressions Karina Freeman was seen for OT evaluation this date. Pt is poor historian, per chart IND mobility and ADLs, lives with spouse. Pt presents to acute OT demonstrating baseline ADL performance and functional mobility. Pt IND exit bed, completed standing grooming tasks, and navigated to/from room. Pt answered 1/3 safety questions correctly. Poor insight into deficits, appears at baseline given hx of dementia. No skilled acute OT needs identified, will sign off. Upon hospital discharge, recommend no OT follow up, rec SUPERVISION for IADLs.     If plan is discharge home, recommend the following:   Supervision due to cognitive status;Direct supervision/assist for medications management;Assistance with cooking/housework     Functional Status Assessment   Patient has not had a recent decline in their functional status     Equipment Recommendations   None recommended by OT     Recommendations for Other Services         Precautions/Restrictions   Precautions Precautions: None Restrictions Weight Bearing Restrictions Per Provider Order: No     Mobility Bed Mobility Overal bed mobility: Independent                  Transfers Overall transfer level: Independent                        Balance Overall balance assessment: Independent                                         ADL either performed or assessed with clinical judgement   ADL Overall ADL's : Independent                                              Vision         Perception         Praxis         Pertinent Vitals/Pain Pain Assessment Pain Assessment: No/denies pain     Extremity/Trunk Assessment Upper Extremity Assessment Upper Extremity Assessment: Overall WFL for tasks assessed   Lower Extremity Assessment Lower Extremity Assessment: Overall WFL for tasks assessed       Communication Communication Communication: No apparent difficulties   Cognition Arousal: Alert Behavior During Therapy: WFL for tasks assessed/performed Cognition: History of cognitive impairments             OT - Cognition Comments: pleasant and redirectable                 Following commands: Intact                  Home Living Family/patient expects to be discharged to:: Private residence Living Arrangements: Spouse/significant other Available Help at Discharge: Family Type of Home: House                                 Prior Functioning/Environment  Prior Level of Function : Patient poor historian/Family not available             Mobility Comments: Per pt, community ambulation distance and no prior hx of falls ADLs Comments: Independent with ADLs    OT Problem List: Decreased safety awareness   OT Treatment/Interventions:        OT Goals(Current goals can be found in the care plan section)   Acute Rehab OT Goals Patient Stated Goal: to go home OT Goal Formulation: With patient Time For Goal Achievement: 12/23/23 Potential to Achieve Goals: Good   OT Frequency:       Co-evaluation              AM-PAC OT "6 Clicks" Daily Activity     Outcome Measure Help from another person eating meals?: None Help from another person taking care of personal grooming?: None Help from another person toileting, which includes using toliet, bedpan, or urinal?: None Help from another person bathing (including washing, rinsing, drying)?: None Help from another person to put on and taking  off regular upper body clothing?: None Help from another person to put on and taking off regular lower body clothing?: None 6 Click Score: 24   End of Session    Activity Tolerance: Patient tolerated treatment well Patient left: in bed;with call bell/phone within reach  OT Visit Diagnosis: Other abnormalities of gait and mobility (R26.89);Muscle weakness (generalized) (M62.81)                Time: 1610-9604 OT Time Calculation (min): 9 min Charges:  OT General Charges $OT Visit: 1 Visit OT Evaluation $OT Eval Low Complexity: 1 Low  Gordan Latina, M.S. OTR/L  12/23/23, 12:26 PM  ascom [phone]

## 2023-12-23 NOTE — Evaluation (Signed)
 Physical Therapy Evaluation Patient Details Name: Karina Freeman MRN: [id number] DOB: 03-07-1951 Today's Date: 12/23/2023  History of Present Illness  Pt is a 73 year old female with history of vascular dementia presenting to the emergency department for evaluation of agitation. Pt's son reported that a few months ago patient had a suicide attempt with a several month inpatient stay.  At that time it was thought that she might have dementia.  Since then, she has had intermittent episodes of confusion.  However more recently she has become increasingly agitated.  Clinical Impression  Pt was pleasant and motivated to participate during the session and put forth good effort throughout. Pt was able to complete transfers and ambulate independently in hallway 200 feet with no AD. Pt was able to maintain balance during dynamic gait activities (head turns and start/stops.) Pt was able to complete SLS test for >10 seconds on BLE at EOB. Due to cognitive status, patient will benefit from 24/7 supervision, but no skilled PT services are needed at this time. Will complete PT orders at this time but will reassess pt pending a change in status upon receipt of new PT orders.       If plan is discharge home, recommend the following: Supervision due to cognitive status;Assist for transportation   Can travel by private vehicle        Equipment Recommendations None recommended by PT  Recommendations for Other Services       Functional Status Assessment Patient has not had a recent decline in their functional status     Precautions / Restrictions Precautions Precautions: None Restrictions Weight Bearing Restrictions Per Provider Order: No      Mobility  Bed Mobility               General bed mobility comments: Not tested    Transfers Overall transfer level: Independent Equipment used: None               General transfer comment: Very good concentric and eccentric control and  stability    Ambulation/Gait Ambulation/Gait assistance: Independent Gait Distance (Feet): 200 Feet Assistive device: None Gait Pattern/deviations: WFL(Within Functional Limits) Gait velocity: WFL     General Gait Details: good stability including during head turns, start/stops, and 180 degree turns  Stairs            Wheelchair Mobility     Tilt Bed    Modified Rankin (Stroke Patients Only)       Balance Overall balance assessment: Independent                                           Pertinent Vitals/Pain Pain Assessment Pain Assessment: No/denies pain    Home Living Family/patient expects to be discharged to:: Unsure                   Additional Comments: Pt stated that she is going to "live with her pastor"; poor historian in general, unable to provide reliable history or prior level of function    Prior Function Prior Level of Function : Patient poor historian/Family not available             Mobility Comments: Per pt, community ambulation distance and no prior hx of falls ADLs Comments: Independent with ADLs     Extremity/Trunk Assessment   Upper Extremity Assessment Upper Extremity Assessment: Overall WFL for tasks assessed  Lower Extremity Assessment Lower Extremity Assessment: Overall WFL for tasks assessed       Communication   Communication Communication: No apparent difficulties    Cognition Arousal: Alert Behavior During Therapy: WFL for tasks assessed/performed   PT - Cognitive impairments: Difficult to assess, No family/caregiver present to determine baseline                         Following commands: Intact       Cueing Cueing Techniques: Verbal cues     General Comments General comments (skin integrity, edema, etc.): Maintained balance during dynamic gait activities (head turns, start/stops), SLS > 10 seconds BLE with good stability    Exercises     Assessment/Plan    PT  Assessment Patient does not need any further PT services  PT Problem List         PT Treatment Interventions      PT Goals (Current goals can be found in the Care Plan section)  Acute Rehab PT Goals PT Goal Formulation: All assessment and education complete, DC therapy    Frequency       Co-evaluation               AM-PAC PT "6 Clicks" Mobility  Outcome Measure Help needed turning from your back to your side while in a flat bed without using bedrails?: None Help needed moving from lying on your back to sitting on the side of a flat bed without using bedrails?: None Help needed moving to and from a bed to a chair (including a wheelchair)?: None Help needed standing up from a chair using your arms (e.g., wheelchair or bedside chair)?: None Help needed to walk in hospital room?: None Help needed climbing 3-5 steps with a railing? : None 6 Click Score: 24    End of Session Equipment Utilized During Treatment: Gait belt Activity Tolerance: Patient tolerated treatment well Patient left: in bed Nurse Communication: Mobility status PT Visit Diagnosis: Difficulty in walking, not elsewhere classified (R26.2)    Time: 3086-5784 PT Time Calculation (min) (ACUTE ONLY): 11 min   Charges:   PT Evaluation $PT Eval Low Complexity: 1 Low   PT General Charges $$ ACUTE PT VISIT: 1 Visit         Viva Gallaher, SPT 12/23/23, 10:00 AM

## 2023-12-23 NOTE — ED Notes (Signed)
 Pt given a breakfast tray
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
## 2023-12-23 NOTE — ED Notes (Signed)
 Pt spouse, Nitza Schmid, called at this time for update on pt. Spouse informed that per handoff report, pt did well today, has taken medications and eaten well. Pt also has been moving around well. No further information to update on at this time.

## 2023-12-23 NOTE — NC FL2 (Addendum)
 [place]  MEDICAID FL2 LEVEL OF CARE FORM     IDENTIFICATION  Patient Name: Karina Freeman Birthdate: 1950/09/02 Sex: female Admission Date (Current Location): 12/17/2023  [facility] and [place] Number:  Chiropodist and Address:  [facility], [address] [place], [place] [address]      Provider Number: [id number]  Attending Physician Name and Address:  No att. providers found  Relative Name and Phone Number:  Breckin Zafar [address] [place], [place] [address] [phone]    Current Level of Care: Hospital Recommended Level of Care: Memory Care Prior Approval Number:    Date Approved/Denied:   PASRR Number:    Discharge Plan:  ([facility])    Current Diagnoses: Patient Active Problem List   Diagnosis Date Noted   Vascular dementia (HCC) 10/11/2023   Heart failure with improved ejection fraction (HFimpEF) (HCC) 09/25/2023   White matter abnormality on MRI of brain 09/25/2023   Suicidal ideation 05/04/2023   Anxiety state 05/04/2023   Insomnia 03/26/2023   Essential hypertension 02/21/2023   Coronary artery disease involving native coronary artery of native heart with unstable angina pectoris (HCC) 02/21/2023   Ischemic cardiomyopathy 02/16/2023   Chronic kidney disease, stage 3 (HCC) 06/25/2016   History of stroke 02/10/2015   Osteoporosis 02/10/2015   Hyperlipidemia 02/10/2015   GERD (gastroesophageal reflux disease) 02/10/2015   Toenail fungus 02/10/2015   Severe major depression without psychotic features (HCC) 02/10/2015    Orientation RESPIRATION BLADDER Height & Weight     Self  Normal Continent Weight: 149 lb 14.6 oz (68 kg) Height:  5\' 7"  (170.2 cm)  BEHAVIORAL SYMPTOMS/MOOD NEUROLOGICAL BOWEL NUTRITION STATUS  Verbally abusive   Continent Diet  AMBULATORY STATUS COMMUNICATION OF NEEDS Skin   Supervision Verbally Normal                       Personal Care Assistance Level of Assistance  Bathing, Feeding, Dressing  Bathing Assistance: Independent Feeding assistance: Independent Dressing Assistance: Limited assistance     Functional Limitations Info  Sight, Hearing, Speech Sight Info: Adequate Hearing Info: Adequate Speech Info: Adequate    SPECIAL CARE FACTORS FREQUENCY                       Contractures Contractures Info: Not present    Additional Factors Info  Code Status, Allergies Code Status Info: FULL Allergies Info: CITALOPRAM, CODEINE, PENICILLIN G BEZATHINE           Current Medications (12/23/2023):  This is the current hospital active medication list Current Facility-Administered Medications  Medication Dose Route Frequency Provider Last Rate Last Admin   amLODipine  (NORVASC ) tablet 5 mg  5 mg Oral Daily Ray, Neha, MD   5 mg at 12/23/23 1015   ARIPiprazole  (ABILIFY ) tablet 4 mg  4 mg Oral Daily Lubertha Rush, MD   4 mg at 12/23/23 1015   aspirin  chewable tablet 81 mg  81 mg Oral Daily Ray, Auburn Leak, MD   81 mg at 12/23/23 1015   atorvastatin  (LIPITOR ) tablet 80 mg  80 mg Oral Daily Ray, Auburn Leak, MD   80 mg at 12/23/23 1015   carvedilol  (COREG ) tablet 12.5 mg  12.5 mg Oral BID WC Claria Crofts, MD   12.5 mg at 12/23/23 3557   cephALEXin  (KEFLEX ) capsule 500 mg  500 mg Oral BID Claria Crofts, MD   500 mg at 12/23/23 1015   losartan  (COZAAR ) tablet 50 mg  50  mg Oral Daily Ray, Auburn Leak, MD   50 mg at 12/23/23 1015   melatonin tablet 2.5 mg  2.5 mg Oral QHS PRN Tan, Ting Xu, MD   2.5 mg at 12/22/23 2111   sertraline  (ZOLOFT ) tablet 100 mg  100 mg Oral Daily Ray, Auburn Leak, MD   100 mg at 12/23/23 1018   ticagrelor  (BRILINTA ) tablet 90 mg  90 mg Oral BID Claria Crofts, MD   90 mg at 12/23/23 1015   Current Outpatient Medications  Medication Sig Dispense Refill   amLODipine  (NORVASC ) 5 MG tablet Take 1 tablet (5 mg total) by mouth at bedtime. 90 tablet 3   ARIPiprazole  (ABILIFY ) 2 MG tablet Take 1 tablet (2 mg total) by mouth daily. 30 tablet 1   aspirin  81 MG chewable tablet Chew 1 tablet (81 mg  total) by mouth daily. 90 tablet 3   atorvastatin  (LIPITOR ) 80 MG tablet Take 1 tablet (80 mg total) by mouth daily. 90 tablet 3   carvedilol  (COREG ) 12.5 MG tablet Take 1 tablet (12.5 mg total) by mouth 2 (two) times daily with a meal. 180 tablet 3   losartan  (COZAAR ) 50 MG tablet Take 1 tablet (50 mg total) by mouth daily. 90 tablet 3   sertraline  (ZOLOFT ) 100 MG tablet Take 1 tablet (100 mg total) by mouth daily. 30 tablet 1   ticagrelor  (BRILINTA ) 90 MG TABS tablet Take 1 tablet (90 mg total) by mouth 2 (two) times daily. 180 tablet 1   feeding supplement (ENSURE ENLIVE / ENSURE PLUS) LIQD Take 237 mLs by mouth 2 (two) times daily between meals. 237 mL 12   nitroGLYCERIN  (NITROSTAT ) 0.4 MG SL tablet Place 1 tablet (0.4 mg total) under the tongue every 5 (five) minutes as needed for chest pain. 100 tablet 3     Discharge Medications: Please see discharge summary for a list of discharge medications.  Relevant Imaging Results:  Relevant Lab Results:   Additional Information [id number]  [place] L Earvin Blazier, LCSW

## 2023-12-23 NOTE — ED Notes (Signed)
 Pt with an instance on incontinence as she tried to make it to the bathroom. Pt given clean scrub bottoms, underwear and wipes. EDT Faith assisting pt with care.

## 2023-12-23 NOTE — ED Notes (Signed)
Physical Therapy at bedside to work with pt.

## 2023-12-23 NOTE — ED Notes (Signed)
 Pt had episode of incontinence while walking to the bathroom. Makenna, EDT provided assistance to pt and cleansed pt of incontinence and got pt changed into new clean scrubs. Pts linen changed out for clean linen and bed remade.

## 2023-12-23 NOTE — ED Notes (Signed)
vol/toc placement.. 
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
## 2023-12-23 NOTE — ED Notes (Signed)
 Hospital meal provided, pt tolerated w/o complaints.  Waste discarded appropriately.

## 2023-12-23 NOTE — ED Notes (Signed)
 Received breakfast and beverage. 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 Would like to brush her teeth.

## 2023-12-23 NOTE — ED Provider Notes (Signed)
-----------------------------------------   7:22 AM on 12/23/2023 -----------------------------------------   Blood pressure 130/77, pulse 78, temperature 98.2 F (36.8 C), temperature source Oral, resp. rate 19, height 5\' 7"  (1.702 m), weight 68 kg, SpO2 98%.  The patient is calm and cooperative at this time.  There have been no acute events since the last update.  Awaiting disposition plan from case management/social work.    Kamrin Spath, Clover Dao, DO 12/23/23 [phone]

## 2023-12-24 NOTE — ED Notes (Signed)
 Pt ambulated to and from bathroom, no assistance required.

## 2023-12-24 NOTE — ED Notes (Signed)
 Pt given breakfast tray

## 2023-12-24 NOTE — ED Provider Notes (Signed)
-----------------------------------------   5:23 AM on 12/24/2023 -----------------------------------------   Blood pressure 119/82, pulse 71, temperature 98.3 F (36.8 C), temperature source Oral, resp. rate 18, height 5\' 7"  (1.702 m), weight 68 kg, SpO2 100%.  The patient is calm and cooperative at this time.  There have been no acute events since the last update.  Awaiting disposition plan from Social Work team.   Malerie Eakins J, MD 12/24/23 [phone]

## 2023-12-24 NOTE — ED Notes (Signed)
 Gave pt crackers and ice cream for snacks.

## 2023-12-24 NOTE — ED Provider Notes (Signed)
 Patient is resting comfortably.  No acute events.  Disposition pending TOC/case management.  BP (!) 162/76   Pulse 77   Temp 97.8 F (36.6 C) (Oral)   Resp 18   Ht 5\' 7"  (1.702 m)   Wt 68 kg   LMP  (LMP Unknown)   SpO2 100%   BMI 23.48 kg/m     Collis Deaner, MD 12/24/23 2040

## 2023-12-24 NOTE — ED Notes (Signed)
 Pt breakfast provided at bedside

## 2023-12-24 NOTE — ED Notes (Addendum)
 RN taking over care of pt at this time. Pt had a good day today and was compliant with medications. Pt awaiting placement. Pt ABCs intact. RR even and unlabored. Pt in NAD. Bed in lowest locked position.   Past Medical History:  Diagnosis Date   Abnormal Pap smear of cervix 11/11/2015   Acute encephalopathy 08/17/2023   Acute HFrEF (heart failure with reduced ejection fraction) (HCC) 02/22/2023   Acute ST elevation myocardial infarction (STEMI) of anterior wall (HCC) 02/15/2023   Anxiety    CVA (cerebral infarction)    Depressive disorder    GERD (gastroesophageal reflux disease)    Hyperlipidemia    Hypertension    IFG (impaired fasting glucose)    Osteoporosis    ST elevation myocardial infarction (STEMI) (HCC) 02/17/2023

## 2023-12-25 DIAGNOSIS — F01518 Vascular dementia, unspecified severity, with other behavioral disturbance: Secondary | ICD-10-CM

## 2023-12-25 DIAGNOSIS — F03918 Unspecified dementia, unspecified severity, with other behavioral disturbance: Secondary | ICD-10-CM

## 2023-12-25 NOTE — ED Notes (Signed)
 Daughter visiting with pt, Karina Freeman monitoring visit with security .

## 2023-12-25 NOTE — ED Notes (Signed)
 Pt soiled clothing on the way to urinate. Pt sheets changed

## 2023-12-25 NOTE — ED Notes (Signed)
 Pt wandered into hallway with small blotch of blood on forehead and piece of hair in her hands. Pt states that she doesn't know how she ended up bleeding, but had fairly tangled piece of hair in her hand and a drop of blood over her forehead. Pt scalp searched x3 but no signs of bleeding from scalp. Pt wound cleaned and dried off. Blood cleaned from fingertips. Provider made aware.

## 2023-12-25 NOTE — ED Notes (Signed)
 Pt provided with lunch tray. Pt in the bathroom. Tray left at bedside.

## 2023-12-25 NOTE — ED Notes (Signed)
Pt given dinner meal 

## 2023-12-25 NOTE — ED Notes (Signed)
 Snack given to pt at this time. Pt very appreciative
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
## 2023-12-25 NOTE — ED Notes (Signed)
 NP [facility] talking with pt on monitor.

## 2023-12-25 NOTE — ED Provider Notes (Signed)
 BP (!) 162/74 (BP Location: Right Arm)   Pulse 77   Temp 97.7 F (36.5 C) (Oral)   Resp 18   Ht 5' 7 (1.702 m)   Wt 68 kg   LMP  (LMP Unknown)   SpO2 98%   BMI 23.48 kg/m   Patient is resting.  No acute events since last update.  Disposition per social work team.   Collis Deaner, MD 12/25/23 1547

## 2023-12-25 NOTE — ED Notes (Signed)
 Pt up to restroom with slow gait unsteady gait. Pt had removed socks and encouraged by staff to place on her feet when walking. Pt states she had voided in her pants and needed to change. While in the restroom pt reports there is a bug flying around so she is attempting to kill it. Pt has placed toilet paper in multiple places on the floor to catch the bug. Assisted with disposing of soiled clothing and new clothes provided.

## 2023-12-25 NOTE — ED Notes (Signed)
 RN taking over care of pt who had a good day today. Pt was calm and cooperative, still awaiting placement. Pt ABCs intact. RR even and unlabored. Pt in NAD. Bed in lowest locked position.   Past Medical History:  Diagnosis Date   Abnormal Pap smear of cervix 11/11/2015   Acute encephalopathy 08/17/2023   Acute HFrEF (heart failure with reduced ejection fraction) (HCC) 02/22/2023   Acute ST elevation myocardial infarction (STEMI) of anterior wall (HCC) 02/15/2023   Anxiety    CVA (cerebral infarction)    Depressive disorder    GERD (gastroesophageal reflux disease)    Hyperlipidemia    Hypertension    IFG (impaired fasting glucose)    Osteoporosis    ST elevation myocardial infarction (STEMI) (HCC) 02/17/2023

## 2023-12-25 NOTE — ED Provider Notes (Signed)
-----------------------------------------   6:09 AM on 12/25/2023 -----------------------------------------   Blood pressure (!) 162/76, pulse 77, temperature 97.8 F (36.6 C), temperature source Oral, resp. rate 18, height 5' 7 (1.702 m), weight 68 kg, SpO2 100%.  The patient is calm and cooperative at this time.  There have been no acute events since the last update.  Awaiting disposition plan from Social Work team.   Ameera Tigue J, MD 12/25/23 [phone]

## 2023-12-26 ENCOUNTER — Ambulatory Visit: Admitting: Psychiatry

## 2023-12-26 NOTE — TOC Progression Note (Addendum)
 Transition of Care [facility]) - Progression Note    Patient Details  Name: Karina Freeman MRN: [id number] Date of Birth: 04-19-1951  Transition of Care [facility]) CM/SW Contact  Elsie Halo, RN Phone Number: 12/26/2023, 10:14 AM  Clinical Narrative:     Stella Edward outreached to the patient's daughter, [name] [phone], and left a voicemail message requesting a callback. TOC outreached to the patient's husband, [name] [phone] to advise the patient is medically ready for discharge. Per [name] the patient is not ready for discharge due to her vascular dementia and needs to be admitted to geripsych. TOC tried to explain what medically ready meant but was interrupted. The patient advised that his step daughter, [name], is working on the paperwork for [employer]. TOC explained that we need that paperwork completed and asked for him to encourage Tanya to stay in touch with TOC. FL2 sent to ALF and [facility] in [place], bed offers are still pending.   TOC will continue to follow.  Expected Discharge Plan: Memory Care Barriers to Discharge: Financial Resources, Unsafe home situation  Expected Discharge Plan and Services In-house Referral: Financial Counselor Discharge Planning Services: CM Consult   Living arrangements for the past 2 months: Single Family Home                                       Social Determinants of Health (SDOH) Interventions SDOH Screenings   Food Insecurity: No Food Insecurity (10/11/2023)  Housing: Low Risk  (10/11/2023)  Transportation Needs: No Transportation Needs (08/30/2023)  Utilities: Not At Risk (10/11/2023)  Alcohol Screen: Low Risk  (08/03/2023)  Depression (PHQ2-9): Low Risk  (10/11/2023)  Social Connections: Moderately Integrated (08/03/2023)  Tobacco Use: Medium Risk (12/17/2023)    Readmission Risk Interventions     No data to display

## 2023-12-26 NOTE — ED Notes (Signed)
 Pt given lunch at this time.

## 2023-12-26 NOTE — ED Provider Notes (Signed)
-----------------------------------------   3:57 AM on 12/26/2023 -----------------------------------------   Blood pressure 136/71, pulse 62, temperature 98.2 F (36.8 C), temperature source Oral, resp. rate 18, height 5' 7 (1.702 m), weight 68 kg, SpO2 99%.  The patient is calm and cooperative at this time.  There have been no acute events since the last update.  Awaiting disposition plan from Social Work team.   Laterria Lasota J, MD 12/26/23 [phone]

## 2023-12-26 NOTE — ED Notes (Signed)
 Pt given phone to speak to son

## 2023-12-26 NOTE — ED Notes (Signed)
Pt provided with breakfast tray and beverage

## 2023-12-27 NOTE — ED Notes (Signed)
 Pt. Family stated they had previously taken patient belongings home when asked by this RN

## 2023-12-27 NOTE — TOC Progression Note (Signed)
 Transition of Care [facility]) - Progression Note    Patient Details  Name: Karina Freeman MRN: [id number] Date of Birth: 12/11/1950  [facility] Care [facility]) CM/SW Contact  [place] L Smrithi [place], [place] Phone Number: 12/27/2023, 9:08 AM  Clinical Narrative:     CSW spoke with pt daughter, [name]. CSW advised that the ED is not a long term care option. Pt. Is stable and she has been cleared medically and psychiatrically. The family's plan was to leave pt in the ED until a placement is secured and/or Medicaid approved. CSW advised that this plan is not appropriate for the pt needs. Family agreed to discharge patient into their care. Pt will be picked up today by 4:30pm.   Expected Discharge Plan: Memory Care Barriers to Discharge: Financial Resources, Unsafe home situation  Expected Discharge Plan and Services In-house Referral: Financial Counselor Discharge Planning Services: CM Consult   Living arrangements for the past 2 months: Single Family Home                                       Social Determinants of Health (SDOH) Interventions SDOH Screenings   Food Insecurity: No Food Insecurity (10/11/2023)  Housing: Low Risk  (10/11/2023)  Transportation Needs: No Transportation Needs (08/30/2023)  Utilities: Not At Risk (10/11/2023)  Alcohol Screen: Low Risk  (08/03/2023)  Depression (PHQ2-9): Low Risk  (10/11/2023)  Social Connections: Moderately Integrated (08/03/2023)  Tobacco Use: Medium Risk (12/17/2023)    Readmission Risk Interventions     No data to display

## 2023-12-27 NOTE — ED Provider Notes (Signed)
 Notified by [place] Herndon with social work team that patient was cleared for discharge back to her family today.  I reviewed her chart.  She presented initially with agitation.  She was medically and psychiatrically cleared.  Case was discussed with family and they are comfortable with discharge back to their care as they arrange long-term placement.  Patient resting comfortably in bed.  She was discharged in stable condition.   Claria Crofts, MD 12/27/23 [phone]

## 2023-12-27 NOTE — ED Provider Notes (Signed)
-----------------------------------------   7:10 AM on 12/27/2023 -----------------------------------------   Blood pressure 138/62, pulse 70, temperature 98.2 F (36.8 C), temperature source Oral, resp. rate 16, height 1.702 m (5' 7), weight 68 kg, SpO2 96%.  The patient is calm and cooperative at this time.  There have been no acute events since the last update.  Awaiting disposition plan from [facility] team.   Lynnda Sas, MD 12/27/23 [phone]

## 2023-12-27 NOTE — ED Notes (Signed)
 PT refused night time melatonin at this time

## 2023-12-27 NOTE — ED Notes (Signed)
 Vol toc placement

## 2023-12-27 NOTE — TOC Transition Note (Signed)
 Transition of Care [facility]) - Discharge Note   Patient Details  Name: Karina Freeman MRN: [id number] Date of Birth: 1951/04/20  Transition of Care [facility]) CM/SW Contact:  [place] L Shephanie Romas, LCSW Phone Number: 12/27/2023, 4:09 PM   Clinical Narrative:     Patients daughter is discharging pt home. Resources were discussed with family in detail regarding Social Services, LTC Medicaid, [employer]. Pt is stable. Psych has cleared patient twice. A search for [facility] and ALF was completed. Pt was not offered any beds. Pt is ready for discharge. Family is completing LTC Medicaid application.   TOC Signing Off.   Final next level of care: Home/Self Care Barriers to Discharge: Financial Resources, Unsafe home situation   Patient Goals and CMS Choice Patient states their goals for this hospitalization and ongoing recovery are:: She's unable to verbalize due to dementia.          Discharge Placement                  Name of family member notified: Lennice Quivers    Discharge Plan and Services Additional resources added to the After Visit Summary for   In-house Referral: Financial Counselor Discharge Planning Services: CM Consult                                 Social Drivers of Health (SDOH) Interventions SDOH Screenings   Food Insecurity: No Food Insecurity (10/11/2023)  Housing: Low Risk  (10/11/2023)  Transportation Needs: No Transportation Needs (08/30/2023)  Utilities: Not At Risk (10/11/2023)  Alcohol Screen: Low Risk  (08/03/2023)  Depression (PHQ2-9): Low Risk  (10/11/2023)  Social Connections: Moderately Integrated (08/03/2023)  Tobacco Use: Medium Risk (12/17/2023)     Readmission Risk Interventions     No data to display

## 2023-12-27 NOTE — ED Notes (Signed)
 Breakfast tray was given, pt refused a shower at this time

## 2023-12-27 NOTE — Discharge Instructions (Addendum)
 [facility] (Private Pay Agency) [phone]

## 2023-12-27 NOTE — Progress Notes (Signed)
   12/27/23 1245  Spiritual Encounters  Type of Visit Follow up  Care provided to: Patient  Reason for visit Routine spiritual support  OnCall Visit Yes   Chaplain visited patient because staff thought she'd benefit from having Spiritual Care.  Chaplain went into the space and offered the patient a compassionate presence.  Patient shared that people (family members) were outside of the door for her but no one was there waiting.  Patient was very frustrated because she wants to be discharged.  Patient shared the patient across the way wanted a Bible and when she offered hers, she was told not to give hers away.  Chaplain said if the patient needs a Bible that patient can let the staff know and they will call for the Chaplain.  Chaplain When Chaplain asked patient what she finds meaningful in times of frustration, the patient said she prays.  Chaplain offered prayer and prayed with the patient.    Rev. Rana M. Nolon Baxter, M.Div. Chaplain Resident  [facility]

## 2023-12-30 ENCOUNTER — Encounter: Payer: Self-pay | Admitting: Internal Medicine

## 2024-01-01 ENCOUNTER — Encounter (INDEPENDENT_AMBULATORY_CARE_PROVIDER_SITE_OTHER): Payer: Self-pay

## 2024-01-01 DIAGNOSIS — F03918 Unspecified dementia, unspecified severity, with other behavioral disturbance: Secondary | ICD-10-CM

## 2024-01-02 ENCOUNTER — Other Ambulatory Visit: Payer: Self-pay | Admitting: Psychiatry

## 2024-01-02 MED ORDER — QUETIAPINE FUMARATE 25 MG PO TABS: 25.0000 mg | ORAL_TABLET | Freq: Every day | ORAL | 0 refills | Status: AC
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
## 2024-01-06 DIAGNOSIS — I671 Cerebral aneurysm, nonruptured: Secondary | ICD-10-CM | POA: Diagnosis not present

## 2024-01-06 DIAGNOSIS — Z1152 Encounter for screening for COVID-19: Secondary | ICD-10-CM | POA: Diagnosis not present

## 2024-01-14 ENCOUNTER — Ambulatory Visit: Admitting: Psychiatry

## 2024-01-14 DIAGNOSIS — R5381 Other malaise: Secondary | ICD-10-CM | POA: Diagnosis not present

## 2024-01-14 DIAGNOSIS — R279 Unspecified lack of coordination: Secondary | ICD-10-CM | POA: Diagnosis not present

## 2024-01-14 DIAGNOSIS — M6281 Muscle weakness (generalized): Secondary | ICD-10-CM | POA: Diagnosis not present

## 2024-01-16 DIAGNOSIS — E785 Hyperlipidemia, unspecified: Secondary | ICD-10-CM | POA: Diagnosis not present

## 2024-01-22 DIAGNOSIS — M79675 Pain in left toe(s): Secondary | ICD-10-CM | POA: Diagnosis not present

## 2024-01-22 DIAGNOSIS — L84 Corns and callosities: Secondary | ICD-10-CM | POA: Diagnosis not present

## 2024-01-22 DIAGNOSIS — L603 Nail dystrophy: Secondary | ICD-10-CM | POA: Diagnosis not present

## 2024-01-22 DIAGNOSIS — M79674 Pain in right toe(s): Secondary | ICD-10-CM | POA: Diagnosis not present

## 2024-01-22 DIAGNOSIS — B351 Tinea unguium: Secondary | ICD-10-CM | POA: Diagnosis not present

## 2024-01-22 DIAGNOSIS — R2681 Unsteadiness on feet: Secondary | ICD-10-CM | POA: Diagnosis not present

## 2024-01-25 NOTE — Progress Notes (Deleted)
 BH MD/PA/NP OP Progress Note  01/25/2024 4:33 PM Karina Freeman  MRN:  [id number]  Chief Complaint: No chief complaint on file.  HPI: ***  Since the last visit, she presented to ED with family for evaluation of behavioral disturbances. Medical workup without evidence of toxic, metabolic or infectious contributor and psychiatry evaluated with recommendation for safety measures and outpatient management. CM has been assisting with referrals for ALF memory care and family has been exploring options as well.   Continue Seroquel  25mg  at bedtime,  25mg  BID PRN agitation - Continue abilify  2mg  QD - Continue zoloft  100mg  every day     Household: husband Marital status: married since 2008.  Number of children: 3 (2 sons, 1 daughter in [place]) Employment: unemployed, used to work for [employer] years ago Education: did not graduate from [employer] (did not want to go there),  GED She grew up in [place]. She had good relationship with her parents   Visit Diagnosis: No diagnosis found.  Past Psychiatric History: Please see initial evaluation for full details. I have reviewed the history. No updates at this time.     Past Medical History:  Past Medical History:  Diagnosis Date   Abnormal Pap smear of cervix 11/11/2015   Acute encephalopathy 08/17/2023   Acute HFrEF (heart failure with reduced ejection fraction) (HCC) 02/22/2023   Acute ST elevation myocardial infarction (STEMI) of anterior wall (HCC) 02/15/2023   Anxiety    CVA (cerebral infarction)    Depressive disorder    GERD (gastroesophageal reflux disease)    Hyperlipidemia    Hypertension    IFG (impaired fasting glucose)    Osteoporosis    ST elevation myocardial infarction (STEMI) (HCC) 02/17/2023    Past Surgical History:  Procedure Laterality Date   APPENDECTOMY     CORONARY IMAGING/OCT N/A 02/21/2023   Procedure: CORONARY IMAGING/OCT;  Surgeon: Mady Bruckner, MD;  Location: [facility] INVASIVE CV LAB;  Service: Cardiovascular;   Laterality: N/A;   CORONARY PRESSURE/FFR STUDY N/A 02/21/2023   Procedure: CORONARY PRESSURE/FFR STUDY;  Surgeon: Mady Bruckner, MD;  Location: [facility] INV[facility]IVE CV [facility];  Service: Cardiovascular;  Laterality: N/A;   CORONARY STENT INTERVENTION N/A 02/21/2023   Procedure: CORONARY STENT INTERVENTION;  Surgeon: Mady Bruckner, MD;  Location: [facility]AS[facility] CV [facility];  Service: Cardiovascular;  Laterality: N/A;   CORONARY/GRAFT ACUTE MI REVASCULARIZATION N/A 02/15/2023   Procedure: Coronary/Graft Acute MI Revascularization;  Surgeon: Darron Deatrice LABOR, MD;  Location: [facility] CV [facility];  Service: Cardiovascular;  Laterality: N/A;   LEFT HEART CATH AND CORONARY ANGIOGRAPHY N/A 02/15/2023   Procedure: LEFT HEART CATH AND CORONARY ANGIOGRAPHY;  Surgeon: Darron Deatrice LABOR, MD;  Location: [facility] CV [facility];  Service: Cardiovascular;  Laterality: N/A;   LEFT HEART CATH AND CORONARY ANGIOGRAPHY N/A 02/21/2023   Procedure: LEFT HEART CATH AND CORONARY ANGIOGRAPHY;  Surgeon: Mady Bruckner, MD;  Location: [facility] CV [facility];  Service: Cardiovascular;  Laterality: N/A;   TONSILLECTOMY     TUBAL LIGATION      Family Psychiatric History: Please see initial evaluation for full details. I have reviewed the history. No updates at this time.     Family History:  Family History  Problem Relation Age of Onset   Heart disease Mother    Cancer Mother        breast   Heart disease Father    Heart disease Sister    Heart disease Brother    Bipolar disorder Brother    Breast cancer Neg Hx  Social History:  Social History   Socioeconomic History   Marital status: Married    Spouse name: Marinell   Number of children: 3   Years of education: Not on file   Highest education level: GED or equivalent  Occupational History   Not on file  Tobacco Use   Smoking status: Former    Current packs/day: 0.00    Types: Cigarettes    Quit date: 07/16/1973    Years since quitting: 50.5   Smokeless tobacco: Never   Vaping Use   Vaping status: Never Used  Substance and Sexual Activity   Alcohol use: No    Alcohol/week: 0.0 standard drinks of alcohol   Drug use: No   Sexual activity: Yes    Birth control/protection: None  Other Topics Concern   Not on file  Social History Narrative   Not on file   Social Drivers of Health   Financial Resource Strain: Low Risk  (01/07/2024)   Received [facility]   Overall Financial Resource Strain (CARDIA)    How hard is it for you to pay for the very basics like food, housing, medical care, and heating?: Not very hard  Food Insecurity: No Food Insecurity (01/07/2024)   Received from [facility]   Hunger Vital Sign    Within the past 12 months, you worried that your food would run out before you got the money to buy more.: Never true    Within the past 12 months, the food you bought just didn't last and you didn't have money to get more.: Never true  Transportation Needs: No Transportation Needs (01/07/2024)   [facility]   PRAPARE - Transportation    Lack of Transportation (Medical): No    Lack of Transportation (Non-Medical): No  Physical Activity: Not on file  Stress: Not on file  Social Connections: Moderately Integrated (08/03/2023)   Social Connection and Isolation Panel    Frequency of Communication with Friends and Family: Once a week    Frequency of Social Gatherings with Friends and Family: Once a week    Attends Religious Services: 1 to 4 times per year    Active Member of [employer] or Organizations: Yes    Attends Banker Meetings: 1 to 4 times per year    Marital Status: Married    Allergies:  Allergies  Allergen Reactions   Citalopram Hydrobromide Nausea And Vomiting   Codeine Diarrhea and Nausea And Vomiting   Penicillin G Benzathine     Metabolic Disorder Labs: Lab Results  Component Value Date   HGBA1C 5.4 02/15/2023   MPG 108.28 02/15/2023   No results found for: PROLACTIN Lab  Results  Component Value Date   CHOL 125 03/14/2023   TRIG 86 03/14/2023   HDL 52 03/14/2023   CHOLHDL 2.4 03/14/2023   VLDL 17 02/15/2023   LDLCALC 56 03/14/2023   LDLCALC 164 (H) 02/15/2023   Lab Results  Component Value Date   TSH 1.050 03/26/2023    Therapeutic Level Labs: No results found for: LITHIUM No results found for: VALPROATE No results found for: CBMZ  Current Medications: Current Outpatient Medications  Medication Sig Dispense Refill   amLODipine  (NORVASC ) 5 MG tablet Take 1 tablet (5 mg total) by mouth at bedtime. 90 tablet 3   aspirin  81 MG chewable tablet Chew 1 tablet (81 mg total) by mouth daily. 90 tablet 3   atorvastatin  (LIPITOR ) 80 MG tablet Take 1 tablet (80 mg  total) by mouth daily. 90 tablet 3   carvedilol  (COREG ) 12.5 MG tablet Take 1 tablet (12.5 mg total) by mouth 2 (two) times daily with a meal. 180 tablet 3   feeding supplement (ENSURE ENLIVE / ENSURE PLUS) LIQD Take 237 mLs by mouth 2 (two) times daily between meals. 237 mL 12   losartan  (COZAAR ) 50 MG tablet Take 1 tablet (50 mg total) by mouth daily. 90 tablet 3   nitroGLYCERIN  (NITROSTAT ) 0.4 MG SL tablet Place 1 tablet (0.4 mg total) under the tongue every 5 (five) minutes as needed for chest pain. 100 tablet 3   QUEtiapine  (SEROQUEL ) 25 MG tablet Take 1 tablet (25 mg total) by mouth at bedtime. 30 tablet 0   sertraline  (ZOLOFT ) 100 MG tablet Take 1 tablet (100 mg total) by mouth daily. 30 tablet 1   ticagrelor  (BRILINTA ) 90 MG TABS tablet Take 1 tablet (90 mg total) by mouth 2 (two) times daily. 180 tablet 1   No current facility-administered medications for this visit.     Musculoskeletal: Strength & Muscle Tone: within normal limits Gait & Station: normal Patient leans: N/A  Psychiatric Specialty Exam: Review of Systems  There were no vitals taken for this visit.There is no height or weight on file to calculate BMI.  General Appearance: {Appearance:22683}  Eye Contact:  {BHH  EYE CONTACT:22684}  Speech:  Clear and Coherent  Volume:  Normal  Mood:  {BHH MOOD:22306}  Affect:  {Affect (PAA):22687}  Thought Process:  {Thought Process (PAA):22688}  Orientation:  {BHH ORIENTATION (PAA):22689}  Thought Content: {Thought Content:22690}   Suicidal Thoughts:  {ST/HT (PAA):22692}  Homicidal Thoughts:  {ST/HT (PAA):22692}  Memory:  {BHH MEMORY:22881}  Judgement:  {Judgement (PAA):22694}  Insight:  {Insight (PAA):22695}  Psychomotor Activity:  Normal  Concentration:  Concentration: Fair and Attention Span: Fair  Recall:  Poor  Fund of Knowledge: Fair  Language: Good  Akathisia:  No  Handed:  Right  AIMS (if indicated): {Desc; done/not:10129}  Assets:  Social Support  ADL's:  {BHH JIO'D:77709}  Cognition: {chl bhh cognition:304700322}  Sleep:  {BHH GOOD/FAIR/POOR:22877}   Screenings: AUDIT    Flowsheet Row Admission (Discharged) from 08/03/2023 in [facility] BE[facility]  Alcohol Use Disorder Identification Test Final Score (AUDIT) 0   GAD-7    Flowsheet Row Office Visit from 09/25/2023 in [facility] Primary Care & Sports Medicine at [facility] Office Visit from 05/28/2023 in [facility] Primary Care & Sports Medicine at [facility] Office Visit from 03/26/2023 in [facility] Primary Care & Sports Medicine at [facility]  Total GAD-7 Score 1 0 4   PHQ2-9    Flowsheet Row Office Visit from 10/11/2023 in [facility] Dept Of [facility] Office Visit from 09/25/2023 in [facility] Primary Care & Sports Medicine at [facility] Office Visit from 05/28/2023 in [facility] Primary Care & Sports Medicine at [facility] Cardiac Rehab from 04/04/2023 in [facility] Cardiac and Pulmonary Rehab Office Visit from 03/26/2023 in [facility] Primary Care & Sports Medicine at [facility]  PHQ-2 Total Score 0 0 0 0 1  PHQ-9 Total Score 0 3 0 3 5   Flowsheet Row ED from 12/17/2023 in [facility] Emergency  Department at [facility] Admission (Discharged) from 08/03/2023 in [facility] BEHAVIORAL MEDICINE ED from 08/02/2023 in [facility] Emergency Department at [facility]  C-SSRS RISK CATEGORY No Risk High Risk High Risk     Assessment and Plan:  ISELA STANTZ is a 73 y.o. year old female with a history of depression, vascular dementia, CVA, CAD s/p anterior STEMI and LAD PCI, HFimpEF, hypertension, who is referred for depression.    1. MDD (major depressive disorder), recurrent, in partial remission (HCC) Although both the patient and her husband deny any history of depression prior to the October 2024 admission, an extensive chart review reveals a documented history of depression, with citalopram listed as an allergy as early as 2016. During the recent admission, concern for catatonia was raised due to observed psychomotor slowing. Although the patient adamantly denies mood symptoms or suicidal ideation, the evaluation is complicated by her cognitive impairment. Additionally, her husband reported that she had become increasingly withdrawn in the weeks leading up to admission and stated he heard her express suicidal ideation at the scene. The differential diagnosis includes major depressive disorder and delirium in the context of an underlying neurocognitive disorder. She denies any depressive symptoms on today's evaluation.  Due to the recent episode of withdrawn, suicidal attempt which led to admission, will continue current medication regimen to avoid relapse in her mood symptoms.  Will continue sertraline  for depression, and Abilify  as adjunctive treatment for depression.        2. 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 Chronic white matter disease and remote callosal, bilateral basal ganglia, thalami and right cerebellar infarcts. Head CT  12/2023 Minimal volume loss with chronic microvascular ischemic changes and remote appearing striatocapsular infarct, unchanged from same day noncontrast CT brain.  No acute appearing intracranial abnormality.   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  Neuropsych assessment:  Etiology: VaD  The exam is notable for word-finding difficulty, memory loss (forgot her pursue at the end of the interview), irrelevant thought process, slight irritability (against her husband), although she remains well-engaged and cooperative throughout the visit.  According to the chart review, there is a concern of cognitive impairment which was observed during the admission, and the neurologist raised a possible diagnosis of vascular dementia. (Reported bedside MOCA to be reported not to exceed 10./30).  She was advised again to obtain labs to rule out medical health issues contributing to her symptoms.  We do more evaluation at the next visit, and may consider referral to OT.  Time was spent providing psychoeducation on behavioral modification strategies to both her husband and the patient.        Last checked  EKG HR 59, Qtc 445 msec  12/2023  Lipid panels LDL 56 02/2023  HbA1c 116 10/2022      Plan Continue sertraline  100 mg at night  Continue Abilify  2 mg at night  Obtain lab (folic acid, vitamin B12) at [facility] Next appointment- 7/1 at 3:30, IP     The patient demonstrates the following risk factors for suicide: Chronic risk factors for suicide include: psychiatric disorder of depression . Acute risk factors for suicide include: unemployment. Protective factors for this patient include: positive  social support and hope for the future. Considering these factors, the overall suicide  risk at this point appears to be moderate, but not at imminent risk. Guns are locked by her husband, and the patient does not have access to it. Patient is appropriate for outpatient follow up.   Collaboration of Care: Collaboration of Care: {BH OP Collaboration of Care:21014065}  Patient/Guardian was advised Release of Information must be obtained prior to any record release in order to collaborate their care with an outside provider. Patient/Guardian was advised if they have not already done so to contact the registration department to sign all necessary forms in order for us  to release information regarding their care.   Consent: Patient/Guardian gives verbal consent for treatment and assignment of benefits for services provided during this visit. Patient/Guardian expressed understanding and agreed to proceed.    Katheren Sleet, MD 01/25/2024, 4:33 PM

## 2024-01-25 NOTE — Progress Notes (Signed)
 Given pt required placement PT/OT ordered to evaluate for her needs in placement.

## 2024-01-26 DIAGNOSIS — E559 Vitamin D deficiency, unspecified: Secondary | ICD-10-CM | POA: Diagnosis not present

## 2024-01-26 DIAGNOSIS — I129 Hypertensive chronic kidney disease with stage 1 through stage 4 chronic kidney disease, or unspecified chronic kidney disease: Secondary | ICD-10-CM | POA: Diagnosis not present

## 2024-01-26 DIAGNOSIS — Z79899 Other long term (current) drug therapy: Secondary | ICD-10-CM | POA: Diagnosis not present

## 2024-01-26 DIAGNOSIS — I10 Essential (primary) hypertension: Secondary | ICD-10-CM | POA: Diagnosis not present

## 2024-01-27 ENCOUNTER — Telehealth: Payer: Self-pay | Admitting: Internal Medicine

## 2024-01-27 ENCOUNTER — Telehealth: Payer: Self-pay | Admitting: Psychiatry

## 2024-01-27 NOTE — Telephone Encounter (Signed)
 Copied from CRM [id number]. Topic: Referral - Request for Referral >> Jan 27, 2024  9:44 AM Powell HERO wrote: Did the patient discuss referral with their provider in the last year? No.   Appointment offered? Yes, she is inpatient currently at [facility]  Type of order/referral and detailed reason for visit: Patient is    Preference of office, provider, location: [facility] [place]  If referral order, have you been seen by this specialty before? Yes She is inpatient there now, Marinell (husband) states she needs a referral sent over. He requests to please call Bascom (Daughter) @ [phone] with any thing in relation to this request.   Can we respond through MyChart? No

## 2024-01-27 NOTE — Telephone Encounter (Signed)
 Thank you. Yes, I would advise them to reach out to the facility for further support.

## 2024-01-27 NOTE — Telephone Encounter (Signed)
 Patient is in [place], [facility], for memory care. Spouse called to cancel the appointment for 01-28-24 and is asking about a psychiatrist in the area or does the facility. Advised him to ask the facility those questions. But he wanted you to be aware.

## 2024-01-28 ENCOUNTER — Ambulatory Visit: Admitting: Psychiatry

## 2024-01-28 ENCOUNTER — Ambulatory Visit: Admitting: Internal Medicine

## 2024-01-28 NOTE — Telephone Encounter (Signed)
 Tried to call [name]. Unable to reach. Left VM- waiting for call back.

## 2024-01-30 DIAGNOSIS — N390 Urinary tract infection, site not specified: Secondary | ICD-10-CM | POA: Diagnosis not present

## 2024-02-04 DIAGNOSIS — K4091 Unilateral inguinal hernia, without obstruction or gangrene, recurrent: Secondary | ICD-10-CM | POA: Diagnosis not present

## 2024-02-04 DIAGNOSIS — K4090 Unilateral inguinal hernia, without obstruction or gangrene, not specified as recurrent: Secondary | ICD-10-CM | POA: Diagnosis not present

## 2024-02-04 DIAGNOSIS — Z79899 Other long term (current) drug therapy: Secondary | ICD-10-CM | POA: Diagnosis not present

## 2024-02-04 DIAGNOSIS — R9431 Abnormal electrocardiogram [ECG] [EKG]: Secondary | ICD-10-CM | POA: Diagnosis not present

## 2024-02-04 DIAGNOSIS — R300 Dysuria: Secondary | ICD-10-CM | POA: Diagnosis not present

## 2024-02-04 DIAGNOSIS — R1031 Right lower quadrant pain: Secondary | ICD-10-CM | POA: Diagnosis not present

## 2024-02-04 DIAGNOSIS — Z743 Need for continuous supervision: Secondary | ICD-10-CM | POA: Diagnosis not present

## 2024-02-06 DIAGNOSIS — Z79899 Other long term (current) drug therapy: Secondary | ICD-10-CM | POA: Diagnosis not present

## 2024-02-06 DIAGNOSIS — R2681 Unsteadiness on feet: Secondary | ICD-10-CM | POA: Diagnosis not present

## 2024-02-06 DIAGNOSIS — W19XXXA Unspecified fall, initial encounter: Secondary | ICD-10-CM | POA: Diagnosis not present

## 2024-02-08 DIAGNOSIS — R109 Unspecified abdominal pain: Secondary | ICD-10-CM | POA: Diagnosis not present

## 2024-02-08 DIAGNOSIS — R279 Unspecified lack of coordination: Secondary | ICD-10-CM | POA: Diagnosis not present

## 2024-02-08 DIAGNOSIS — Z79899 Other long term (current) drug therapy: Secondary | ICD-10-CM | POA: Diagnosis not present

## 2024-02-08 DIAGNOSIS — K4090 Unilateral inguinal hernia, without obstruction or gangrene, not specified as recurrent: Secondary | ICD-10-CM | POA: Diagnosis not present

## 2024-02-08 DIAGNOSIS — R1031 Right lower quadrant pain: Secondary | ICD-10-CM | POA: Diagnosis not present

## 2024-02-08 DIAGNOSIS — K439 Ventral hernia without obstruction or gangrene: Secondary | ICD-10-CM | POA: Diagnosis not present

## 2024-02-08 DIAGNOSIS — M6281 Muscle weakness (generalized): Secondary | ICD-10-CM | POA: Diagnosis not present

## 2024-02-08 DIAGNOSIS — R5381 Other malaise: Secondary | ICD-10-CM | POA: Diagnosis not present

## 2024-02-08 DIAGNOSIS — N134 Hydroureter: Secondary | ICD-10-CM | POA: Diagnosis not present

## 2024-02-09 DIAGNOSIS — N390 Urinary tract infection, site not specified: Secondary | ICD-10-CM | POA: Diagnosis not present

## 2024-02-09 DIAGNOSIS — Z79899 Other long term (current) drug therapy: Secondary | ICD-10-CM | POA: Diagnosis not present

## 2024-02-09 DIAGNOSIS — R410 Disorientation, unspecified: Secondary | ICD-10-CM | POA: Diagnosis not present

## 2024-02-09 DIAGNOSIS — W19XXXA Unspecified fall, initial encounter: Secondary | ICD-10-CM | POA: Diagnosis not present

## 2024-02-09 DIAGNOSIS — Z885 Allergy status to narcotic agent status: Secondary | ICD-10-CM | POA: Diagnosis not present

## 2024-02-09 DIAGNOSIS — Z880 Allergy status to penicillin: Secondary | ICD-10-CM | POA: Diagnosis not present

## 2024-02-10 DIAGNOSIS — Z743 Need for continuous supervision: Secondary | ICD-10-CM | POA: Diagnosis not present

## 2024-02-13 DIAGNOSIS — I119 Hypertensive heart disease without heart failure: Secondary | ICD-10-CM | POA: Diagnosis not present

## 2024-02-13 DIAGNOSIS — K4090 Unilateral inguinal hernia, without obstruction or gangrene, not specified as recurrent: Secondary | ICD-10-CM | POA: Diagnosis not present

## 2024-03-06 DIAGNOSIS — E785 Hyperlipidemia, unspecified: Secondary | ICD-10-CM | POA: Diagnosis not present

## 2024-03-06 DIAGNOSIS — Z87440 Personal history of urinary (tract) infections: Secondary | ICD-10-CM | POA: Diagnosis not present

## 2024-03-06 DIAGNOSIS — R410 Disorientation, unspecified: Secondary | ICD-10-CM | POA: Diagnosis not present

## 2024-03-06 DIAGNOSIS — Z880 Allergy status to penicillin: Secondary | ICD-10-CM | POA: Diagnosis not present

## 2024-03-06 DIAGNOSIS — R451 Restlessness and agitation: Secondary | ICD-10-CM | POA: Diagnosis not present

## 2024-03-06 DIAGNOSIS — I10 Essential (primary) hypertension: Secondary | ICD-10-CM | POA: Diagnosis not present

## 2024-03-06 DIAGNOSIS — Z79899 Other long term (current) drug therapy: Secondary | ICD-10-CM | POA: Diagnosis not present

## 2024-03-06 DIAGNOSIS — Z743 Need for continuous supervision: Secondary | ICD-10-CM | POA: Diagnosis not present

## 2024-03-06 DIAGNOSIS — Z885 Allergy status to narcotic agent status: Secondary | ICD-10-CM | POA: Diagnosis not present

## 2024-03-07 DIAGNOSIS — Z87440 Personal history of urinary (tract) infections: Secondary | ICD-10-CM | POA: Diagnosis not present

## 2024-03-07 DIAGNOSIS — E785 Hyperlipidemia, unspecified: Secondary | ICD-10-CM | POA: Diagnosis not present

## 2024-03-07 DIAGNOSIS — I10 Essential (primary) hypertension: Secondary | ICD-10-CM | POA: Diagnosis not present

## 2024-03-07 DIAGNOSIS — Z79899 Other long term (current) drug therapy: Secondary | ICD-10-CM | POA: Diagnosis not present

## 2024-03-07 DIAGNOSIS — Z880 Allergy status to penicillin: Secondary | ICD-10-CM | POA: Diagnosis not present

## 2024-03-07 DIAGNOSIS — Z885 Allergy status to narcotic agent status: Secondary | ICD-10-CM | POA: Diagnosis not present

## 2024-03-09 DIAGNOSIS — E559 Vitamin D deficiency, unspecified: Secondary | ICD-10-CM | POA: Diagnosis not present

## 2024-03-09 DIAGNOSIS — G4700 Insomnia, unspecified: Secondary | ICD-10-CM | POA: Diagnosis not present

## 2024-03-09 DIAGNOSIS — N1831 Chronic kidney disease, stage 3a: Secondary | ICD-10-CM | POA: Diagnosis not present

## 2024-03-09 DIAGNOSIS — I2510 Atherosclerotic heart disease of native coronary artery without angina pectoris: Secondary | ICD-10-CM | POA: Diagnosis not present

## 2024-03-09 DIAGNOSIS — D519 Vitamin B12 deficiency anemia, unspecified: Secondary | ICD-10-CM | POA: Diagnosis not present

## 2024-03-09 DIAGNOSIS — I129 Hypertensive chronic kidney disease with stage 1 through stage 4 chronic kidney disease, or unspecified chronic kidney disease: Secondary | ICD-10-CM | POA: Diagnosis not present

## 2024-03-12 DIAGNOSIS — E785 Hyperlipidemia, unspecified: Secondary | ICD-10-CM | POA: Diagnosis not present

## 2024-03-12 DIAGNOSIS — I2510 Atherosclerotic heart disease of native coronary artery without angina pectoris: Secondary | ICD-10-CM | POA: Diagnosis not present

## 2024-03-14 ENCOUNTER — Encounter: Payer: Self-pay | Admitting: Pharmacist

## 2024-03-14 NOTE — Progress Notes (Signed)
   03/14/2024  Patient ID: [facility], female   DOB: 09/07/1950, 73 y.o.   MRN: [id number]  This patient is appearing on a report for adherence measure for cholesterol (statin) and hypertension (ACEi/ARB) medications this calendar year.   Medication: atorvastatin  80 mg Last fill date: 03/02/2024 for 7 day supply  Medication: losartan  50 mg Last fill date: 03/02/2024 for 7 day supply  Insurance report was not up to date. No action needed at this time.   Note patient currently receiving medications from [facility].  Sharyle Sia, PharmD, [facility] [phone]

## 2024-03-17 DIAGNOSIS — K436 Other and unspecified ventral hernia with obstruction, without gangrene: Secondary | ICD-10-CM | POA: Diagnosis not present

## 2024-03-18 DIAGNOSIS — K6389 Other specified diseases of intestine: Secondary | ICD-10-CM | POA: Diagnosis not present

## 2024-03-18 DIAGNOSIS — R339 Retention of urine, unspecified: Secondary | ICD-10-CM | POA: Diagnosis not present

## 2024-03-18 DIAGNOSIS — Z79899 Other long term (current) drug therapy: Secondary | ICD-10-CM | POA: Diagnosis not present

## 2024-03-18 DIAGNOSIS — Z880 Allergy status to penicillin: Secondary | ICD-10-CM | POA: Diagnosis not present

## 2024-03-18 DIAGNOSIS — E785 Hyperlipidemia, unspecified: Secondary | ICD-10-CM | POA: Diagnosis not present

## 2024-03-18 DIAGNOSIS — Z885 Allergy status to narcotic agent status: Secondary | ICD-10-CM | POA: Diagnosis not present

## 2024-03-18 DIAGNOSIS — T83091A Other mechanical complication of indwelling urethral catheter, initial encounter: Secondary | ICD-10-CM | POA: Diagnosis not present

## 2024-03-18 DIAGNOSIS — Z743 Need for continuous supervision: Secondary | ICD-10-CM | POA: Diagnosis not present

## 2024-03-18 DIAGNOSIS — I10 Essential (primary) hypertension: Secondary | ICD-10-CM | POA: Diagnosis not present

## 2024-03-18 DIAGNOSIS — R109 Unspecified abdominal pain: Secondary | ICD-10-CM | POA: Diagnosis not present

## 2024-03-18 DIAGNOSIS — K529 Noninfective gastroenteritis and colitis, unspecified: Secondary | ICD-10-CM | POA: Diagnosis not present

## 2024-03-19 DIAGNOSIS — R339 Retention of urine, unspecified: Secondary | ICD-10-CM | POA: Diagnosis not present

## 2024-03-19 DIAGNOSIS — I10 Essential (primary) hypertension: Secondary | ICD-10-CM | POA: Diagnosis not present

## 2024-03-19 DIAGNOSIS — E785 Hyperlipidemia, unspecified: Secondary | ICD-10-CM | POA: Diagnosis not present

## 2024-03-19 DIAGNOSIS — K529 Noninfective gastroenteritis and colitis, unspecified: Secondary | ICD-10-CM | POA: Diagnosis not present

## 2024-03-19 DIAGNOSIS — I2510 Atherosclerotic heart disease of native coronary artery without angina pectoris: Secondary | ICD-10-CM | POA: Diagnosis not present

## 2024-03-19 DIAGNOSIS — Z880 Allergy status to penicillin: Secondary | ICD-10-CM | POA: Diagnosis not present

## 2024-03-19 DIAGNOSIS — T83091A Other mechanical complication of indwelling urethral catheter, initial encounter: Secondary | ICD-10-CM | POA: Diagnosis not present

## 2024-03-19 DIAGNOSIS — T83198A Other mechanical complication of other urinary devices and implants, initial encounter: Secondary | ICD-10-CM | POA: Diagnosis not present

## 2024-03-19 DIAGNOSIS — Z885 Allergy status to narcotic agent status: Secondary | ICD-10-CM | POA: Diagnosis not present

## 2024-03-19 DIAGNOSIS — R410 Disorientation, unspecified: Secondary | ICD-10-CM | POA: Diagnosis not present

## 2024-03-19 DIAGNOSIS — Z79899 Other long term (current) drug therapy: Secondary | ICD-10-CM | POA: Diagnosis not present

## 2024-03-26 DIAGNOSIS — R339 Retention of urine, unspecified: Secondary | ICD-10-CM | POA: Diagnosis not present

## 2024-03-26 DIAGNOSIS — K439 Ventral hernia without obstruction or gangrene: Secondary | ICD-10-CM | POA: Diagnosis not present

## 2024-04-07 DIAGNOSIS — B351 Tinea unguium: Secondary | ICD-10-CM | POA: Diagnosis not present

## 2024-04-07 DIAGNOSIS — L605 Yellow nail syndrome: Secondary | ICD-10-CM | POA: Diagnosis not present

## 2024-04-07 DIAGNOSIS — M79674 Pain in right toe(s): Secondary | ICD-10-CM | POA: Diagnosis not present

## 2024-04-07 DIAGNOSIS — L851 Acquired keratosis [keratoderma] palmaris et plantaris: Secondary | ICD-10-CM | POA: Diagnosis not present

## 2024-04-07 DIAGNOSIS — R2689 Other abnormalities of gait and mobility: Secondary | ICD-10-CM | POA: Diagnosis not present

## 2024-04-07 DIAGNOSIS — M79675 Pain in left toe(s): Secondary | ICD-10-CM | POA: Diagnosis not present

## 2024-04-08 ENCOUNTER — Telehealth: Payer: Self-pay

## 2024-04-08 NOTE — Telephone Encounter (Signed)
   Name: Karina Freeman  DOB: Apr 03, 1951  MRN: [id number]  Primary Cardiologist: Deatrice Cage, MD  Chart reviewed as part of pre-operative protocol coverage. The patient has an upcoming visit scheduled with Barnie Hila, DNP on 05/11/2024 at which time clearance can be addressed in case there are any issues that would impact surgical recommendations.  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   I added preop FYI to appointment note so that provider is aware to address at time of outpatient visit.  Per office protocol the cardiology provider should forward their finalized clearance decision and recommendations regarding antiplatelet therapy to the requesting party below.    Barnie Hila DNP will provide  input on holding Brilinta  and ASA as requested below so that this information is available to the clearing provider at time of patient's appointment.   I will route this message as FYI to requesting party and remove this message from the preop box as separate preop APP input not needed at this time.   Please call with any questions.  Lamarr Satterfield, NP  04/08/2024, 3:12 PM

## 2024-04-08 NOTE — Telephone Encounter (Signed)
 Patient has upcoming appointment clearance can be addressed at office visit and note has been made at appointment line

## 2024-04-08 NOTE — Telephone Encounter (Signed)
   Pre-operative Risk Assessment    Patient Name: Karina Freeman  DOB: Sep 19, 1950 MRN: [id number]   Date of last office visit: 10/31/23 BARNIE HILA, NP Date of next office visit: 05/11/24 BARNIE HILA, NP   Request for Surgical Clearance    Procedure:  ROBOTIC ASSISTED LAPAROSCOPIC REPAIR INCARCERATED VENTRAL HERNIA WITH MESH IMPLANTATION  Date of Surgery:  Clearance TBD                                Surgeon:  DALLAS CRAMP, MD Surgeon's Group or Practice Name:  [facility] Phone number:  [phone] Fax number:  [phone]  ATTN: ELLOUISE HOLLAND   Type of Clearance Requested:   - Medical  - Pharmacy:  Hold Aspirin  and Ticagrelor  (Brilinta ) PER REQUEST PATIENT MAY REMAIN ON ASPIRIN    Type of Anesthesia:  GETA   Additional requests/questions:    Signed, Lucie DELENA Ku   04/08/2024, 3:01 PM

## 2024-04-09 DIAGNOSIS — I509 Heart failure, unspecified: Secondary | ICD-10-CM | POA: Diagnosis not present

## 2024-04-09 DIAGNOSIS — D519 Vitamin B12 deficiency anemia, unspecified: Secondary | ICD-10-CM | POA: Diagnosis not present

## 2024-04-09 DIAGNOSIS — G931 Anoxic brain damage, not elsewhere classified: Secondary | ICD-10-CM | POA: Diagnosis not present

## 2024-04-14 DIAGNOSIS — I509 Heart failure, unspecified: Secondary | ICD-10-CM | POA: Diagnosis not present

## 2024-04-29 ENCOUNTER — Ambulatory Visit: Admitting: Internal Medicine

## 2024-05-07 DIAGNOSIS — N1831 Chronic kidney disease, stage 3a: Secondary | ICD-10-CM | POA: Diagnosis not present

## 2024-05-07 DIAGNOSIS — K4090 Unilateral inguinal hernia, without obstruction or gangrene, not specified as recurrent: Secondary | ICD-10-CM | POA: Diagnosis not present

## 2024-05-07 DIAGNOSIS — I129 Hypertensive chronic kidney disease with stage 1 through stage 4 chronic kidney disease, or unspecified chronic kidney disease: Secondary | ICD-10-CM | POA: Diagnosis not present

## 2024-05-07 DIAGNOSIS — Z7902 Long term (current) use of antithrombotics/antiplatelets: Secondary | ICD-10-CM | POA: Diagnosis not present

## 2024-05-11 ENCOUNTER — Ambulatory Visit: Admitting: Student

## 2024-05-15 NOTE — Telephone Encounter (Signed)
 Requesting office sent duplicate. Pt cancelled her appt with cardiology 05/11/24 needed another date.   Pt is scheduled at this time 06/09/24 with Barnie Hila, NP   Once the pt has been cleared the provider will fax their notes and any recommendations to your office.

## 2024-06-05 NOTE — Progress Notes (Unsigned)
 Cardiology Clinic Note   Date: 06/09/2024 ID: Karina Freeman, Karina Freeman 02-Nov-1950, MRN [id number]  Primary Cardiologist:  Deatrice Cage, MD  Chief Complaint   Karina Freeman is a 73 y.o. female who presents to the clinic today for preoperative evaluation.   Patient Profile   Karina Freeman is followed by Dr. Cage for the history outlined below.       Past medical history significant for: CAD. LHC 02/15/2023 (STEMI): Proximal to mid RCA 80%.  Ostial to proximal LCx 30%.  LPAV 50%.  Mid LAD #1 99%, #2 70%, #3 30%.  D2 40%.  Distal LAD 50%.  PCI with DES 3.0 x 34 mm to mid LAD. LHC 02/21/2023 (unstable angina): Mid to distal LAD, LCx, and RCA disease similar to prior catheterization.  Moderate to severe RCA not hemodynamically significant per FFR.  Focal dissection of the proximal edge of recently placed proximal to mid LAD stent extending into the media.  OCT guided PCI to proximal LAD edge dissection with DES 3.0 x 18 mm overlapping with previously placed stent. HFimpEF/ischemic cardiomyopathy. Echo 02/15/2023: EF 30 to 35%.  Akinesis of the left ventricle, entire anteroseptal wall, mid-apical anterior segment.  Mild LVH.  Grade I DD.  Normal RV size/function.  Mild MR. Limited echo 02/22/2023: EF 65 to 70%.  Normal RV size/function.  Mild MR. Hypertension. Hyperlipidemia. Lipid panel 03/14/2023: LDL 56, HDL 52, TG 86, total 125. GERD. Vascular dementia. CVA. CKD stage III.  In summary, patient with a history of CAD with anterior STEMI in August 2024.  She underwent PCI with DES to mid LAD with plan for staged PCI to RCA at a later date.  Echo at the time of her STEMI demonstrated an EF of 30 to 35% as detailed above.  5 days after her STEMI she had sudden onset of substernal chest pressure and heaviness not relieved with aspirin  and NTG.  She underwent repeat LHC which showed focal dissection of proximal edge of LAD stent and she underwent overlapping stent placement as detailed above.   Repeat limited echo at that time showed recovered LV function as detailed above.  Patient was seen in the clinic on 03/07/2023 for routine follow-up. She reported elevated BP readings at home.  She was hypertensive at the time of her visit.  Her losartan  had been decreased and spironolactone  stopped on 02/28/2023 in the setting of elevated creatinine.  Losartan  was increased.  Following her visit she contacted the office with report of continued elevation of BP although improved.  Amlodipine  was added.   Patient was last seen in the office by me on 10/31/2023 for routine follow-up.  She was doing well at that time with no cardiac complaints.     History of Present Illness    Today, patient is accompanied by her son. She is doing very well. Patient denies shortness of breath, dyspnea on exertion, lower extremity edema, orthopnea or PND. No chest pain, pressure, or tightness. No palpitations. She is living at a continuing care community now and has settled in very well. She is active walking, using a stepper and dancing. Patient's son reports she moves around all the time. She is pending hernia repair that has not yet been scheduled. She has some pain from the hernia that is managed with Tylenol  as needed.      ROS: All other systems reviewed and are otherwise negative except as noted in History of Present Illness.  EKGs/Labs Reviewed    EKG Interpretation Date/Time:  Tuesday June 09 2024 10:38:48 EST Ventricular Rate:  64 PR Interval:  152 QRS Duration:  80 QT Interval:  442 QTC Calculation: 455 R Axis:   82  Text Interpretation: Normal sinus rhythm Septal infarct , age undetermined When compared with ECG of 31-Oct-2023 14:05, No significant change was found Confirmed by Loistine Sober [id number]) on 06/09/2024 10:44:10 AM   12/17/2023: ALT 25; AST 27; BUN 21; Creatinine, Ser 0.97; Potassium 3.8; Sodium 141   12/17/2023: Hemoglobin 11.9; WBC 7.4    Physical Exam    VS:  BP (!) 118/58  (BP Location: Left Arm, Patient Position: Sitting, Cuff Size: Normal)   Pulse 64   Ht 5' 7 (1.702 m)   Wt 150 lb 6.4 oz (68.2 kg)   LMP  (LMP Unknown)   SpO2 98%   BMI 23.56 kg/m  , BMI Body mass index is 23.56 kg/m.  GEN: Well nourished, well developed, in no acute distress. Neck: No JVD or carotid bruits. Cardiac:  RRR.  No murmur. No rubs or gallops.   Respiratory:  Respirations regular and unlabored. Clear to auscultation without rales, wheezing or rhonchi. GI: Soft, nontender, nondistended. Extremities: Radials/DP/PT 2+ and equal bilaterally. No clubbing or cyanosis. No edema   Skin: Warm and dry, no rash. Neuro: Strength intact.  Assessment & Plan   CAD S/p PCI with DES to mid LAD August 2024 in the setting of STEMI.  PCI with overlapping DES to proximal LAD edge dissection in the setting of unstable angina 5 days following original stent placement.  Patient denies chest pain, pressure or tightness. EKG today shows NSR. She is very active walking, using a stepper, and dancing.  - Continue amlodipine , carvedilol , aspirin , atorvastatin , as needed SL NTG. - Per Dr. Darron, patient may stop Brilinta  now that she is over a year out from PCI. She is to continue daily aspirin .    HFimpEF/ischemic cardiomyopathy Echo 02/15/2023 demonstrated EF 30 to 35% with akinesis and Grade I DD.  Limited echo on 02/22/2023 demonstrated EF 65 to 70%.  Patient denies lower extremity edema, shortness of breath, DOE, orthopnea or PND. Euvolemic and well compensated on exam. - Continue carvedilol , losartan .   Hypertension BP today 118/58. No headaches or dizziness reported.  - Continue amlodipine , carvedilol , losartan .   Hyperlipidemia LDL 56 August 2024, at goal. - Continue atorvastatin .  Preoperative cardiovascular risk assessment  Robotic assisted laparoscopic repair incarcerated ventral hernia with mesh implantation by Dr. Luke. According to the RCRI, patient has a 11% risk of MACE. Patient reports  activity equivalent to >4.0 METS (walking, stepper, dancing).  -Based on ACC/AHA guidelines, Karina Freeman would be at acceptable risk for the planned procedure without further cardiovascular testing.  - Per Dr. Darron, patient may stop Brilinta  as she is a year from PCI. Ideally aspirin  should be continued without interruption, however if the bleeding risk is too great, aspirin  may be held for 5-7 days prior to surgery. Please resume aspirin  post operatively when it is felt to be safe from a bleeding standpoint.    Disposition: Stop Brilinta . Return in 6 months or sooner as needed.          Signed, Sober HERO. Karess Harner, DNP, NP-C

## 2024-06-09 ENCOUNTER — Ambulatory Visit: Attending: Student | Admitting: Student

## 2024-06-09 ENCOUNTER — Encounter: Payer: Self-pay | Admitting: Student

## 2024-06-09 VITALS — BP 118/58 | HR 64 | Ht 67.0 in | Wt 150.4 lb

## 2024-06-09 DIAGNOSIS — E785 Hyperlipidemia, unspecified: Secondary | ICD-10-CM

## 2024-06-09 DIAGNOSIS — I2510 Atherosclerotic heart disease of native coronary artery without angina pectoris: Secondary | ICD-10-CM | POA: Diagnosis not present

## 2024-06-09 DIAGNOSIS — Z01810 Encounter for preprocedural cardiovascular examination: Secondary | ICD-10-CM | POA: Diagnosis not present

## 2024-06-09 DIAGNOSIS — I10 Essential (primary) hypertension: Secondary | ICD-10-CM

## 2024-06-09 DIAGNOSIS — I5020 Unspecified systolic (congestive) heart failure: Secondary | ICD-10-CM

## 2024-06-09 DIAGNOSIS — I255 Ischemic cardiomyopathy: Secondary | ICD-10-CM

## 2024-06-09 NOTE — Patient Instructions (Signed)
 Medication Instructions:  STOP: Ticagrelor  (Brilinta ) *If you need a refill on your cardiac medications before your next appointment, please call your pharmacy*  Lab Work: NONE If you have labs (blood work) drawn today and your tests are completely normal, you will receive your results only by: MyChart Message (if you have MyChart) OR A paper copy in the mail If you have any lab test that is abnormal or we need to change your treatment, we will call you to review the results.  Testing/Procedures: NONE  Follow-Up: At [facility], you and your health needs are our priority.  As part of our continuing mission to provide you with exceptional heart care, our providers are all part of one team.  This team includes your primary Cardiologist (physician) and Advanced Practice Providers or APPs (Physician Assistants and Nurse Practitioners) who all work together to provide you with the care you need, when you need it.  Your next appointment:   6 month(s)  Provider:   Barnie Hila, NP

## 2024-11-26 ENCOUNTER — Ambulatory Visit: Admitting: Student
